# Patient Record
Sex: Female | Born: 2000 | Race: Black or African American | Hispanic: No | Marital: Single | Smoking: Never smoker
Health system: Southern US, Community
[De-identification: ages and names within clinical notes are randomized; demographics above are authoritative.]

## PROBLEM LIST (undated history)

## (undated) DIAGNOSIS — D573 Sickle-cell trait: Secondary | ICD-10-CM

## (undated) DIAGNOSIS — N390 Urinary tract infection, site not specified: Secondary | ICD-10-CM

## (undated) DIAGNOSIS — D563 Thalassemia minor: Secondary | ICD-10-CM

## (undated) DIAGNOSIS — A599 Trichomoniasis, unspecified: Secondary | ICD-10-CM

## (undated) DIAGNOSIS — K589 Irritable bowel syndrome without diarrhea: Secondary | ICD-10-CM

## (undated) DIAGNOSIS — U071 COVID-19: Secondary | ICD-10-CM

## (undated) DIAGNOSIS — N730 Acute parametritis and pelvic cellulitis: Secondary | ICD-10-CM

## (undated) DIAGNOSIS — F32A Depression, unspecified: Secondary | ICD-10-CM

## (undated) HISTORY — PX: NO PAST SURGERIES: SHX2092

## (undated) HISTORY — PX: INDUCED ABORTION: SHX677

---
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
## 2020-02-14 ENCOUNTER — Emergency Department (HOSPITAL_COMMUNITY): Payer: Medicaid Other

## 2020-02-14 ENCOUNTER — Other Ambulatory Visit: Payer: Self-pay

## 2020-02-14 ENCOUNTER — Emergency Department (HOSPITAL_COMMUNITY)
Admission: EM | Admit: 2020-02-14 | Discharge: 2020-02-14 | Disposition: A | Discharge status: Home / Self Care | Payer: Medicaid Other | Attending: Emergency Medicine | Admitting: Emergency Medicine

## 2020-02-14 DIAGNOSIS — R1011 Right upper quadrant pain: Secondary | ICD-10-CM | POA: Diagnosis not present

## 2020-02-14 DIAGNOSIS — R110 Nausea: Secondary | ICD-10-CM | POA: Diagnosis not present

## 2020-02-14 DIAGNOSIS — Z20822 Contact with and (suspected) exposure to covid-19: Secondary | ICD-10-CM | POA: Diagnosis not present

## 2020-02-14 DIAGNOSIS — R109 Unspecified abdominal pain: Secondary | ICD-10-CM | POA: Diagnosis present

## 2020-02-14 LAB — SARS CORONAVIRUS 2 (TAT 6-24 HRS): SARS Coronavirus 2: NEGATIVE

## 2020-02-14 LAB — URINALYSIS, ROUTINE W REFLEX MICROSCOPIC
Bacteria, UA: NONE SEEN
Bilirubin Urine: NEGATIVE
Glucose, UA: NEGATIVE mg/dL
Hgb urine dipstick: NEGATIVE
Ketones, ur: NEGATIVE mg/dL
Nitrite: NEGATIVE
Protein, ur: NEGATIVE mg/dL
Specific Gravity, Urine: 1.024 (ref 1.005–1.030)
pH: 5 (ref 5.0–8.0)

## 2020-02-14 LAB — CBC
HCT: 36.2 % (ref 36.0–46.0)
Hemoglobin: 11.6 g/dL — ABNORMAL LOW (ref 12.0–15.0)
MCH: 25.6 pg — ABNORMAL LOW (ref 26.0–34.0)
MCHC: 32 g/dL (ref 30.0–36.0)
MCV: 79.7 fL — ABNORMAL LOW (ref 80.0–100.0)
Platelets: 275 10*3/uL (ref 150–400)
RBC: 4.54 MIL/uL (ref 3.87–5.11)
RDW: 14.6 % (ref 11.5–15.5)
WBC: 9.4 10*3/uL (ref 4.0–10.5)
nRBC: 0 % (ref 0.0–0.2)

## 2020-02-14 LAB — COMPREHENSIVE METABOLIC PANEL
ALT: 9 U/L (ref 0–44)
AST: 17 U/L (ref 15–41)
Albumin: 4.1 g/dL (ref 3.5–5.0)
Alkaline Phosphatase: 69 U/L (ref 38–126)
Anion gap: 10 (ref 5–15)
BUN: 7 mg/dL (ref 6–20)
CO2: 25 mmol/L (ref 22–32)
Calcium: 9.2 mg/dL (ref 8.9–10.3)
Chloride: 103 mmol/L (ref 98–111)
Creatinine, Ser: 0.92 mg/dL (ref 0.44–1.00)
GFR calc Af Amer: >60 mL/min (ref >60)
GFR calc non Af Amer: >60 mL/min (ref >60)
Glucose, Bld: 89 mg/dL (ref 70–99)
Potassium: 3.2 mmol/L — ABNORMAL LOW (ref 3.5–5.1)
Sodium: 138 mmol/L (ref 135–145)
Total Bilirubin: 0.1 mg/dL — ABNORMAL LOW (ref 0.3–1.2)
Total Protein: 7 g/dL (ref 6.5–8.1)

## 2020-02-14 LAB — I-STAT BETA HCG BLOOD, ED (MC, WL, AP ONLY): I-stat hCG, quantitative: <5 m[IU]/mL (ref <5)

## 2020-02-14 LAB — LIPASE, BLOOD: Lipase: 29 U/L (ref 11–51)

## 2020-02-14 LAB — PREGNANCY, URINE: Preg Test, Ur: NEGATIVE

## 2020-02-14 MED ORDER — ONDANSETRON HCL 4 MG/2ML IJ SOLN
4.0000 mg | Freq: Once | INTRAMUSCULAR | Status: AC
  Administered 2020-02-14: 4 mg via INTRAVENOUS
  Filled 2020-02-14: qty 2

## 2020-02-14 MED ORDER — MORPHINE SULFATE (PF) 4 MG/ML IV SOLN
4.0000 mg | Freq: Once | INTRAVENOUS | Status: AC
  Administered 2020-02-14: 4 mg via INTRAVENOUS
  Filled 2020-02-14: qty 1

## 2020-02-14 MED ORDER — SODIUM CHLORIDE 0.9 % IV BOLUS
1000.0000 mL | Freq: Once | INTRAVENOUS | Status: AC
  Administered 2020-02-14: 1000 mL via INTRAVENOUS

## 2020-02-14 MED ORDER — ONDANSETRON 4 MG PO TBDP
4.0000 mg | ORAL_TABLET | Freq: Three times a day (TID) | ORAL | 0 refills | Status: DC | PRN
Start: 2020-02-14 — End: 2020-04-12

## 2020-02-14 MED ORDER — FAMOTIDINE IN NACL 20-0.9 MG/50ML-% IV SOLN
20.0000 mg | Freq: Once | INTRAVENOUS | Status: AC
  Administered 2020-02-14: 20 mg via INTRAVENOUS
  Filled 2020-02-14: qty 50

## 2020-02-14 NOTE — Discharge Instructions (Addendum)
Take Zofran as needed for nausea Take Tylenol for abdominal pain You can try warm compresses on the abdomen as well to help with the pain Please follow up with your primary doctor Return to the ER if you are worsening

## 2020-02-14 NOTE — ED Notes (Signed)
Patient transported to CT 

## 2020-02-14 NOTE — ED Notes (Signed)
This Clinical research associate reported to PA that Pt's family wants a CT done of ABD.

## 2020-02-14 NOTE — ED Notes (Signed)
Patient transported to Ultrasound 

## 2020-02-14 NOTE — ED Notes (Signed)
Patient verbalizes understanding of discharge instructions . Opportunity for questions and answers were provided . Armband removed by staff ,Pt discharged from ED. W/C  offered at D/C  and Declined W/C at D/C and was escorted to lobby by RN.  

## 2020-02-14 NOTE — ED Provider Notes (Signed)
[facility] EMERGENCY DEPARTMENT Provider Note   CSN: [id number] Arrival date & time: 02/14/20  0058     History Chief Complaint  Patient presents with  . Abdominal Pain    [facility] is a 19 y.o. female who presents with abdominal pain. She states she's had intermittent RUQ abdominal pain for one week. It feels like "pain". It comes and goes. She cannot identify any aggravating or allieviating factors. She reports associated nausea without vomiting. She hasn't been eating much because she doesn't have an appetite. She has not tried anything for pain. She denies fever, chills, chest pain, SOB, cough, vomiting, diarrhea/constipation, urinary symptoms, vaginal discharge or bleeding. She is 4 days late for her period this month. She denies prior abdominal surgeries.  HPI     Past Medical History:  Diagnosis Date  . Irritable bowel syndrome (IBS)   . Sickle cell trait ([facility])     There are no problems to display for this patient.   Past Surgical History:  Procedure Laterality Date  . NO PAST SURGERIES       OB History    Gravida  1   Para      Term      Preterm      AB      Living        SAB      TAB      Ectopic      Multiple      Live Births              No family history on file.  Social History   Tobacco Use  . Smoking status: Never Smoker  . Smokeless tobacco: Never Used  Vaping Use  . Vaping Use: Never used  Substance Use Topics  . Alcohol use: No  . Drug use: No    Home Medications Prior to Admission medications   Medication Sig Start Date End Date Taking? Authorizing Provider  dicyclomine (BENTYL) 20 MG tablet Take 1 tablet (20 mg total) by mouth 2 (two) times daily. Patient not taking: Reported on 02/14/2020 11/14/19   Renne Crigler, PA-C  metoCLOPramide (REGLAN) 10 MG tablet Take 1 tablet (10 mg total) by mouth 3 (three) times daily between meals as needed for nausea. Patient not taking: Reported on 02/14/2020  10/04/19   Sharen Counter A, CNM  ondansetron (ZOFRAN ODT) 4 MG disintegrating tablet Take 1 tablet (4 mg total) by mouth every 8 (eight) hours as needed for nausea or vomiting. Patient not taking: Reported on 02/14/2020 11/14/19   Renne Crigler, PA-C  promethazine (PHENERGAN) 12.5 MG tablet Take 1-2 tablets (12.5-25 mg total) by mouth at bedtime. Patient not taking: Reported on 02/14/2020 10/04/19   Hurshel Party, CNM    Allergies    Milk [lac bovis]  Review of Systems   Review of Systems  Constitutional: Negative for chills and fever.  Gastrointestinal: Positive for abdominal pain and nausea. Negative for constipation, diarrhea and vomiting.  Genitourinary: Negative for dysuria, flank pain, hematuria, menstrual problem, pelvic pain, vaginal bleeding and vaginal discharge.  All other systems reviewed and are negative.   Physical Exam Updated Vital Signs BP 118/87 (BP Location: Right Arm)   Pulse 65   Temp 98.7 F (37.1 C) (Oral)   Resp 16   Ht 5\' 7"  (1.702 m)   Wt 65.8 kg   LMP 01/16/2019   SpO2 100%   BMI 22.71 kg/m   Physical Exam Vitals and nursing note  reviewed.  Constitutional:      General: She is not in acute distress.    Appearance: She is well-developed.  HENT:     Head: Normocephalic and atraumatic.  Eyes:     General: No scleral icterus.       Right eye: No discharge.        Left eye: No discharge.     Conjunctiva/sclera: Conjunctivae normal.     Pupils: Pupils are equal, round, and reactive to light.  Cardiovascular:     Rate and Rhythm: Normal rate.  Pulmonary:     Effort: Pulmonary effort is normal. No respiratory distress.  Abdominal:     General: Abdomen is flat. Bowel sounds are normal. There is no distension.     Palpations: Abdomen is soft.     Tenderness: There is abdominal tenderness in the right upper quadrant.     Hernia: No hernia is present.  Musculoskeletal:     Cervical back: Normal range of motion.  Skin:    General: Skin  is warm and dry.  Neurological:     Mental Status: She is alert and oriented to person, place, and time.  Psychiatric:        Behavior: Behavior normal.     ED Results / Procedures / Treatments   Labs (all labs ordered are listed, but only abnormal results are displayed) Labs Reviewed  COMPREHENSIVE METABOLIC PANEL - Abnormal; Notable for the following components:      Result Value   Potassium 3.2 (*)    Total Bilirubin 0.1 (*)    All other components within normal limits  CBC - Abnormal; Notable for the following components:   Hemoglobin 11.6 (*)    MCV 79.7 (*)    MCH 25.6 (*)    All other components within normal limits  URINALYSIS, ROUTINE W REFLEX MICROSCOPIC - Abnormal; Notable for the following components:   APPearance HAZY (*)    Leukocytes,Ua SMALL (*)    All other components within normal limits  SARS CORONAVIRUS 2 (TAT 6-24 HRS)  LIPASE, BLOOD  PREGNANCY, URINE  I-STAT BETA HCG BLOOD, ED ([facility], [facility], [facility] ONLY)  POC URINE PREG, ED    EKG None  Radiology CT Renal Stone Study  Result Date: 02/14/2020 CLINICAL DATA:  Flank pain EXAM: CT ABDOMEN AND PELVIS WITHOUT CONTRAST TECHNIQUE: Multidetector CT imaging of the abdomen and pelvis was performed following the standard protocol without IV contrast. COMPARISON:  02/14/2020 right upper quadrant ultrasound. FINDINGS: Please note that lack of intravenous contrast limits evaluation of the viscera and vasculature. Lower chest: Lung bases are clear. Pectus excavatum. Mild hypoattenuation of the cardiac blood pool may reflect anemia. Hepatobiliary: No focal hepatic lesion. No biliary dilatation. Gallbladder is unremarkable. Pancreas: No focal lesions or pancreatic ductal dilatation. No surrounding inflammation. Spleen: Unremarkable. Adrenals/Urinary Tract: Adrenal glands are unremarkable. No focal renal lesion or calculi. No hydronephrosis. Bladder is unremarkable and without intraluminal calculi. Stomach/Bowel: Stomach is within  normal limits. Appendix appears normal. No evidence of obstruction. No bowel wall thickening or inflammatory changes. No ascites. Vascular/Lymphatic: Vasculature is within normal limits for patient's age. No abdominopelvic adenopathy. Reproductive: Unremarkable. Other: External soft tissues are unremarkable. Musculoskeletal: No acute or significant osseous findings. IMPRESSION: No acute abdominopelvic process. No evidence of nephrolithiasis or hydronephrosis. Pectus excavatum.  Query anemia. Electronically Signed   By: Stana Bunting M.D.   On: 02/14/2020 15:38   US Abdomen Limited RUQ  Result Date: 02/14/2020 CLINICAL DATA:  Right upper quadrant pain EXAM:  ULTRASOUND ABDOMEN LIMITED RIGHT UPPER QUADRANT COMPARISON:  None. FINDINGS: Gallbladder: No gallstones or wall thickening visualized. No sonographic Murphy sign noted by sonographer. Common bile duct: Diameter: 1.6 mm Liver: No focal lesion identified. Within normal limits in parenchymal echogenicity. Portal vein is patent on color Doppler imaging with normal direction of blood flow towards the liver. Other: None. IMPRESSION: No acute abnormality noted. Electronically Signed   By: Alcide Clever M.D.   On: 02/14/2020 11:21    Procedures Procedures (including critical care time)  Medications Ordered in ED Medications  morphine 4 MG/ML injection 4 mg (4 mg Intravenous Given 02/14/20 1021)  ondansetron (ZOFRAN) injection 4 mg (4 mg Intravenous Given 02/14/20 1022)  sodium chloride 0.9 % bolus 1,000 mL (0 mLs Intravenous Stopped 02/14/20 1246)  morphine 4 MG/ML injection 4 mg (4 mg Intravenous Given 02/14/20 1251)  famotidine (PEPCID) IVPB 20 mg premix (0 mg Intravenous Stopped 02/14/20 1544)    ED Course  I have reviewed the triage vital signs and the nursing notes.  Pertinent labs & imaging results that were available during my care of the patient were reviewed by me and considered in my medical decision making (see chart for details).  19  year old female presents with RUQ pain which has been on and off for about one week. She has been waiting almost 9 hours prior to my evaluation. Her vitals are normal. She is tender in the RUQ. Labs are overall reassuring. She has mild hypokalemia (3.2) and anemia (11.6). UA has small leukocytes with Ca oxalate crystals. She is not pregnant. Will obtain RUQ [place] and provide symptom control.  [place] is negative. Re-evaluated pt. She is not feeling much better. We discussed risk vs benefit of CT imaging. At this time patient would like to forgo any further imaging and be discharged.   Informed by nursing that the patient has changed her mind - she wants to have imaging done. CT renal ordered.  CT is negative. I'm really not sure what is causing her pain. 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 Advised Tylenol, heating pad, and will rx Zofran. She was advised that she should return if she is not improving. She is asking for a COVID test and work note which was done.   MDM Rules/Calculators/A&P                           Final Clinical Impression(s) / ED Diagnoses Final diagnoses:  RUQ pain    Rx / DC Orders ED Discharge Orders    None       Bethel Born, PA-C 02/15/20 1039    Geoffery Lyons, MD 02/15/20 [phone]

## 2020-02-14 NOTE — ED Notes (Signed)
Pt given gingerale and crackers 

## 2020-02-14 NOTE — ED Triage Notes (Signed)
Pt has been having abdominal pain x 2 days. Pt said having some nausea, no vomiting but need to come up. Pt said possible pregnancy. Pt has no fevers, no chills, no urination issues.

## 2020-03-10 ENCOUNTER — Emergency Department (HOSPITAL_COMMUNITY)
Admission: EM | Admit: 2020-03-10 | Discharge: 2020-03-10 | Disposition: A | Discharge status: Home / Self Care | Payer: Medicaid Other | Attending: Emergency Medicine | Admitting: Emergency Medicine

## 2020-03-10 ENCOUNTER — Other Ambulatory Visit: Payer: Self-pay

## 2020-03-10 ENCOUNTER — Encounter (HOSPITAL_COMMUNITY): Payer: Self-pay

## 2020-03-10 DIAGNOSIS — Z79899 Other long term (current) drug therapy: Secondary | ICD-10-CM | POA: Insufficient documentation

## 2020-03-10 DIAGNOSIS — U071 COVID-19: Secondary | ICD-10-CM | POA: Diagnosis not present

## 2020-03-10 DIAGNOSIS — M791 Myalgia, unspecified site: Secondary | ICD-10-CM | POA: Diagnosis present

## 2020-03-10 LAB — SARS CORONAVIRUS 2 BY RT PCR (HOSPITAL ORDER, PERFORMED IN ~~LOC~~ HOSPITAL LAB): SARS Coronavirus 2: POSITIVE — AB

## 2020-03-10 NOTE — ED Triage Notes (Signed)
Patient complains of body aches, congestion. Has had 1st covid vaccine. Patient reports low grade fever.

## 2020-03-10 NOTE — ED Provider Notes (Signed)
[facility] EMERGENCY DEPARTMENT Provider Note   CSN: [id number] Arrival date & time: 03/10/20  1438     History No chief complaint on file.   Heidi Martin is a 19 y.o. female.  Presents to ER with concern for body aches, congestion, headache.  Reports symptoms ongoing for the past couple days, not worse headache of her life, not sudden onset, no associated neck pain or neck stiffness.  Body aches generalized, no particular place.  Has had some chest discomfort but no ongoing chest pain.  No shortness of breath.  Chills, low-grade fever at home.  Had her first Covid vaccine recently, has not had the second vaccine.  History of IBS, sickle cell trait.  HPI     Past Medical History:  Diagnosis Date  . Irritable bowel syndrome (IBS)   . Sickle cell trait (HCC)     There are no problems to display for this patient.   Past Surgical History:  Procedure Laterality Date  . NO PAST SURGERIES       OB History    Gravida  1   Para      Term      Preterm      AB      Living        SAB      TAB      Ectopic      Multiple      Live Births              No family history on file.  Social History   Tobacco Use  . Smoking status: Never Smoker  . Smokeless tobacco: Never Used  Vaping Use  . Vaping Use: Never used  Substance Use Topics  . Alcohol use: No  . Drug use: No    Home Medications Prior to Admission medications   Medication Sig Start Date End Date Taking? Authorizing Provider  ondansetron (ZOFRAN ODT) 4 MG disintegrating tablet Take 1 tablet (4 mg total) by mouth every 8 (eight) hours as needed for nausea or vomiting. 02/14/20   Bethel Born, PA-C  dicyclomine (BENTYL) 20 MG tablet Take 1 tablet (20 mg total) by mouth 2 (two) times daily. Patient not taking: Reported on 02/14/2020 11/14/19 02/14/20  Renne Crigler, PA-C  metoCLOPramide (REGLAN) 10 MG tablet Take 1 tablet (10 mg total) by mouth 3 (three) times daily between  meals as needed for nausea. Patient not taking: Reported on 02/14/2020 10/04/19 02/14/20  Leftwich-Kirby, Wilmer Floor, CNM  promethazine (PHENERGAN) 12.5 MG tablet Take 1-2 tablets (12.5-25 mg total) by mouth at bedtime. Patient not taking: Reported on 02/14/2020 10/04/19 02/14/20  Leftwich-Kirby, Wilmer Floor, CNM    Allergies    Milk [lac bovis]  Review of Systems   Review of Systems  Constitutional: Positive for chills and fever.  HENT: Negative for ear pain and sore throat.   Eyes: Negative for pain and visual disturbance.  Respiratory: Negative for cough and shortness of breath.   Cardiovascular: Positive for chest pain. Negative for palpitations.  Gastrointestinal: Negative for abdominal pain and vomiting.  Genitourinary: Negative for dysuria and hematuria.  Musculoskeletal: Positive for arthralgias and myalgias. Negative for back pain.  Skin: Negative for color change and rash.  Neurological: Negative for seizures and syncope.  All other systems reviewed and are negative.   Physical Exam Updated Vital Signs BP 120/75   Pulse 94   Temp (!) 100.5 F (38.1 C) (Oral)   Resp 15  LMP 01/16/2019   SpO2 100%   Physical Exam Vitals and nursing note reviewed.  Constitutional:      General: She is not in acute distress.    Appearance: She is well-developed.  HENT:     Head: Normocephalic and atraumatic.  Eyes:     Conjunctiva/sclera: Conjunctivae normal.  Cardiovascular:     Rate and Rhythm: Normal rate and regular rhythm.     Heart sounds: No murmur heard.   Pulmonary:     Effort: Pulmonary effort is normal. No respiratory distress.     Breath sounds: Normal breath sounds.  Abdominal:     Palpations: Abdomen is soft.     Tenderness: There is no abdominal tenderness.  Musculoskeletal:        General: No deformity or signs of injury.     Cervical back: Neck supple.  Skin:    General: Skin is warm and dry.  Neurological:     General: No focal deficit present.     Mental Status:  She is alert and oriented to person, place, and time.  Psychiatric:        Mood and Affect: Mood normal.        Behavior: Behavior normal.     ED Results / Procedures / Treatments   Labs (all labs ordered are listed, but only abnormal results are displayed) Labs Reviewed  SARS CORONAVIRUS 2 BY RT PCR (HOSPITAL ORDER, PERFORMED IN [facility]) - Abnormal; Notable for the following components:      Result Value   SARS Coronavirus 2 POSITIVE (*)    All other components within normal limits    EKG None  Radiology No results found.  Procedures Procedures (including critical care time)  Medications Ordered in ED Medications - No data to display  ED Course  I have reviewed the triage vital signs and the nursing notes.  Pertinent labs & imaging results that were available during my care of the patient were reviewed by me and considered in my medical decision making (see chart for details).    MDM Rules/Calculators/A&P                         19 year old female presenting to the ER with concern for body aches, headache, congestion.  Covid test was positive.  At present, suspect all her symptoms are related to COVID-19.  She is remarkably well-appearing with stable vital signs and I believe she is appropriate for discharge and outpatient management at this time.  Recommended virtual recheck with primary doctor, reviewed return precautions and isolation precautions with patient and her mother via phone.    After the discussed management above, the patient was determined to be safe for discharge.  The patient was in agreement with this plan and all questions regarding their care were answered.  ED return precautions were discussed and the patient will return to the ED with any significant worsening of condition.   Heidi Martin was evaluated in Emergency Department on 03/10/2020 for the symptoms described in the history of present illness. She was evaluated in the context  of the global COVID-19 pandemic, which necessitated consideration that the patient might be at risk for infection with the SARS-CoV-2 virus that causes COVID-19. Institutional protocols and algorithms that pertain to the evaluation of patients at risk for COVID-19 are in a state of rapid change based on information released by regulatory bodies including the CDC and federal and state organizations. These policies  and algorithms were followed during the patient's care in the ED.  Final Clinical Impression(s) / ED Diagnoses Final diagnoses:  COVID-19    Rx / DC Orders ED Discharge Orders    None       Milagros Loll, MD 03/10/20 2123

## 2020-03-10 NOTE — Discharge Instructions (Signed)
Return if you get short of breath or develop worsening symptoms.  Follow up with your primary doctor.

## 2020-03-11 ENCOUNTER — Encounter: Payer: Self-pay | Admitting: Nurse Practitioner

## 2020-03-11 ENCOUNTER — Telehealth (HOSPITAL_COMMUNITY): Payer: Self-pay | Admitting: Nurse Practitioner

## 2020-03-11 DIAGNOSIS — U071 COVID-19: Secondary | ICD-10-CM

## 2020-03-11 NOTE — Telephone Encounter (Signed)
Called to Discuss with patient about Covid symptoms and the use of regeneron, a monoclonal antibody infusion for those with mild to moderate Covid symptoms and at a high risk of hospitalization.     Pt is qualified for this infusion at the [facility] center due to co-morbid conditions and/or a member of an at-risk group.     Unable to reach pt. Left message to return call. Sent mychart message.   Jadore Veals, DNP, AGNP-C [phone] (Infusion Center Hotline)  

## 2020-03-12 ENCOUNTER — Other Ambulatory Visit (HOSPITAL_COMMUNITY): Payer: Self-pay | Admitting: Nurse Practitioner

## 2020-03-12 DIAGNOSIS — U071 COVID-19: Secondary | ICD-10-CM

## 2020-03-12 NOTE — Progress Notes (Signed)
I connected by phone with Heidi Martin on 03/12/2020 at 2:09 PM to discuss the potential use of an new treatment for mild to moderate COVID-19 viral infection in non-hospitalized patients.  This patient is a 19 y.o. female that meets the FDA criteria for Emergency Use Authorization of casirivimab\imdevimab.  Has a (+) direct SARS-CoV-2 viral test result  Has mild or moderate COVID-19   Is ? 19 years of age and weighs ? 40 kg  Is NOT hospitalized due to COVID-19  Is NOT requiring oxygen therapy or requiring an increase in baseline oxygen flow rate due to COVID-19  Is within 10 days of symptom onset  Has at least one of the high risk factor(s) for progression to severe COVID-19 and/or hospitalization as defined in EUA.  Specific high risk criteria : Other high risk medical condition per CDC:  social vulnerability   I have spoken and communicated the following to the patient or parent/caregiver:  1. FDA has authorized the emergency use of casirivimab\imdevimab for the treatment of mild to moderate COVID-19 in adults and pediatric patients with positive results of direct SARS-CoV-2 viral testing who are 61 years of age and older weighing at least 40 kg, and who are at high risk for progressing to severe COVID-19 and/or hospitalization.  2. The significant known and potential risks and benefits of casirivimab\imdevimab, and the extent to which such potential risks and benefits are unknown.  3. Information on available alternative treatments and the risks and benefits of those alternatives, including clinical trials.  4. Patients treated with casirivimab\imdevimab should continue to self-isolate and use infection control measures (e.g., wear mask, isolate, social distance, avoid sharing personal items, clean and disinfect "high touch" surfaces, and frequent handwashing) according to CDC guidelines.   5. The patient or parent/caregiver has the option to accept or refuse casirivimab\imdevimab  .  After reviewing this information with the patient, The patient agreed to proceed with receiving casirivimab\imdevimab infusion and will be provided a copy of the Fact sheet prior to receiving the infusion.Consuello Masse, DNP, AGNP-C [phone] (Infusion Center Hotline)

## 2020-03-13 ENCOUNTER — Ambulatory Visit (HOSPITAL_COMMUNITY)
Admission: RE | Admit: 2020-03-13 | Discharge: 2020-03-13 | Disposition: A | Discharge status: Home / Self Care | Payer: Medicaid Other | Source: Ambulatory Visit | Attending: Pulmonary Disease | Admitting: Pulmonary Disease

## 2020-03-13 DIAGNOSIS — U071 COVID-19: Secondary | ICD-10-CM | POA: Diagnosis present

## 2020-03-13 MED ORDER — SODIUM CHLORIDE 0.9 % IV SOLN: INTRAVENOUS | Status: DC | PRN

## 2020-03-13 MED ORDER — SODIUM CHLORIDE 0.9 % IV SOLN
1200.0000 mg | Freq: Once | INTRAVENOUS | Status: AC
  Administered 2020-03-13: 1200 mg via INTRAVENOUS
  Filled 2020-03-13: qty 10

## 2020-03-13 MED ORDER — FAMOTIDINE IN NACL 20-0.9 MG/50ML-% IV SOLN: 20.0000 mg | Freq: Once | INTRAVENOUS | Status: DC | PRN

## 2020-03-13 MED ORDER — ALBUTEROL SULFATE HFA 108 (90 BASE) MCG/ACT IN AERS: 2.0000 | INHALATION_SPRAY | Freq: Once | RESPIRATORY_TRACT | Status: DC | PRN

## 2020-03-13 MED ORDER — METHYLPREDNISOLONE SODIUM SUCC 125 MG IJ SOLR: 125.0000 mg | Freq: Once | INTRAMUSCULAR | Status: DC | PRN

## 2020-03-13 MED ORDER — DIPHENHYDRAMINE HCL 50 MG/ML IJ SOLN: 50.0000 mg | Freq: Once | INTRAMUSCULAR | Status: DC | PRN

## 2020-03-13 MED ORDER — EPINEPHRINE 0.3 MG/0.3ML IJ SOAJ: 0.3000 mg | Freq: Once | INTRAMUSCULAR | Status: DC | PRN

## 2020-03-13 NOTE — Progress Notes (Signed)
  Diagnosis: COVID-19  Physician: Dr Wright  Procedure: [facility] Med: remdesivir infusion - Provided patient with remdesivir fact sheet for patients, parents and caregivers prior to infusion.  Complications: No immediate complications noted.  Discharge: Discharged home   Heidi Martin 03/13/2020   

## 2020-03-13 NOTE — Discharge Instructions (Signed)

## 2020-03-28 ENCOUNTER — Encounter (HOSPITAL_COMMUNITY): Payer: Self-pay

## 2020-03-28 ENCOUNTER — Emergency Department (HOSPITAL_COMMUNITY)
Admission: EM | Admit: 2020-03-28 | Discharge: 2020-03-29 | Disposition: A | Discharge status: Home / Self Care | Payer: Medicaid Other | Attending: Emergency Medicine | Admitting: Emergency Medicine

## 2020-03-28 ENCOUNTER — Emergency Department (HOSPITAL_COMMUNITY): Payer: Medicaid Other

## 2020-03-28 DIAGNOSIS — H02843 Edema of right eye, unspecified eyelid: Secondary | ICD-10-CM | POA: Diagnosis present

## 2020-03-28 DIAGNOSIS — R6884 Jaw pain: Secondary | ICD-10-CM | POA: Diagnosis not present

## 2020-03-28 DIAGNOSIS — S0231XA Fracture of orbital floor, right side, initial encounter for closed fracture: Secondary | ICD-10-CM | POA: Diagnosis not present

## 2020-03-28 DIAGNOSIS — M79641 Pain in right hand: Secondary | ICD-10-CM | POA: Insufficient documentation

## 2020-03-28 DIAGNOSIS — M791 Myalgia, unspecified site: Secondary | ICD-10-CM | POA: Insufficient documentation

## 2020-03-28 DIAGNOSIS — S0083XA Contusion of other part of head, initial encounter: Secondary | ICD-10-CM | POA: Diagnosis not present

## 2020-03-28 HISTORY — DX: COVID-19: U07.1

## 2020-03-28 NOTE — ED Notes (Signed)
No answer x 4  
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
## 2020-03-29 ENCOUNTER — Emergency Department (HOSPITAL_COMMUNITY)
Admission: EM | Admit: 2020-03-29 | Discharge: 2020-03-30 | Disposition: A | Discharge status: Home / Self Care | Payer: Medicaid Other | Source: Home / Self Care | Attending: Emergency Medicine | Admitting: Emergency Medicine

## 2020-03-29 ENCOUNTER — Emergency Department (HOSPITAL_COMMUNITY): Payer: Medicaid Other

## 2020-03-29 DIAGNOSIS — S0083XA Contusion of other part of head, initial encounter: Secondary | ICD-10-CM

## 2020-03-29 DIAGNOSIS — M791 Myalgia, unspecified site: Secondary | ICD-10-CM | POA: Insufficient documentation

## 2020-03-29 DIAGNOSIS — M7918 Myalgia, other site: Secondary | ICD-10-CM

## 2020-03-29 DIAGNOSIS — S0231XA Fracture of orbital floor, right side, initial encounter for closed fracture: Secondary | ICD-10-CM

## 2020-03-29 MED ORDER — OXYCODONE-ACETAMINOPHEN 5-325 MG PO TABS
1.0000 | ORAL_TABLET | Freq: Once | ORAL | Status: AC
  Administered 2020-03-29: 1 via ORAL
  Filled 2020-03-29: qty 1
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
## 2020-03-30 ENCOUNTER — Emergency Department (HOSPITAL_COMMUNITY): Payer: Medicaid Other

## 2020-03-30 LAB — POC URINE PREG, ED: Preg Test, Ur: NEGATIVE

## 2020-03-30 MED ORDER — OXYCODONE-ACETAMINOPHEN 5-325 MG PO TABS: 1.0000 | ORAL_TABLET | Freq: Four times a day (QID) | ORAL | 0 refills | Status: DC | PRN

## 2020-03-30 MED ORDER — OXYCODONE-ACETAMINOPHEN 5-325 MG PO TABS: 1.0000 | ORAL_TABLET | Freq: Once | ORAL | Status: DC

## 2020-03-30 MED ORDER — NAPROXEN 500 MG PO TABS: 500.0000 mg | ORAL_TABLET | Freq: Two times a day (BID) | ORAL | 0 refills | Status: DC

## 2020-03-30 MED ORDER — DOCUSATE SODIUM 100 MG PO CAPS: 100.0000 mg | ORAL_CAPSULE | Freq: Two times a day (BID) | ORAL | 0 refills | Status: DC

## 2020-03-30 NOTE — ED Notes (Signed)
Patient verbalizes understanding of discharge instructions. Opportunity for questioning and answers were provided. Armband removed by staff, pt discharged from ED.  
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
## 2020-03-30 NOTE — Discharge Instructions (Signed)
Please read and follow all provided instructions.  Your diagnoses today include:  1. Closed fracture of right orbital floor, initial encounter (HCC)   2. Contusion of face, initial encounter   3. Musculoskeletal pain     Tests performed today include:  CT scan of your head and face - showed facial bone fractures as discussed  X-rays of your legs -were normal without signs of broken bones  Vital signs. See below for your results today.   Medications prescribed:   Percocet (oxycodone/acetaminophen) - narcotic pain medication  DO NOT drive or perform any activities that require you to be awake and alert because this medicine can make you drowsy. BE VERY CAREFUL not to take multiple medicines containing Tylenol (also called acetaminophen). Doing so can lead to an overdose which can damage your liver and cause liver failure and possibly death.   Colace - stool softener  This medication can be found over-the-counter.  Use this medication while taking the stronger pain medicine to prevent constipation.   Naproxen - anti-inflammatory pain medication  Do not exceed 500mg  naproxen every 12 hours, take with food  You have been prescribed an anti-inflammatory medication or NSAID. Take with food. Take smallest effective dose for the shortest duration needed for your pain. Stop taking if you experience stomach pain or vomiting.    Take any prescribed medications only as directed.  Home care instructions:  Follow any educational materials contained in this packet.  Keep your head elevated above the level of your heart when possible to minimize swelling.  Use ice 15 minutes every hour on the area of swelling.  Do not apply ice directly to the face.  Follow-up instructions: It is very important that you call the doctor listed to schedule a follow-up appointment to make sure that your injuries are healing appropriately.  Return instructions:  SEEK IMMEDIATE MEDICAL ATTENTION IF:  There  is confusion or drowsiness (although children frequently become drowsy after injury).   You cannot awaken the injured person.   You have more than one episode of vomiting.   You notice dizziness or unsteadiness which is getting worse, or inability to walk.   You have convulsions or unconsciousness.   You experience severe, persistent headaches not relieved by Tylenol.  You cannot use arms or legs normally.   There are changes in pupil sizes. (This is the black center in the colored part of the eye)   There is clear or bloody discharge from the nose or ears.   You have change in speech, vision, swallowing, or understanding.   Localized weakness, numbness, tingling, or change in bowel or bladder control.  You have any other emergent concerns.  Additional Information: You have had a head injury which does not appear to require admission at this time.  Your vital signs today were: BP 111/79 (BP Location: Right Arm)   Pulse 71   Temp 98.3 F (36.8 C) (Oral)   Resp 17   LMP 03/17/2020 Comment: neg preg test 03/30/20  SpO2 100%  If your blood pressure (BP) was elevated above 135/85 this visit, please have this repeated by your doctor within one month. --------------

## 2020-04-09 ENCOUNTER — Encounter (HOSPITAL_COMMUNITY): Payer: Self-pay

## 2020-04-09 ENCOUNTER — Inpatient Hospital Stay (HOSPITAL_COMMUNITY): Payer: Medicaid Other

## 2020-04-09 ENCOUNTER — Other Ambulatory Visit: Payer: Self-pay

## 2020-04-09 ENCOUNTER — Inpatient Hospital Stay (HOSPITAL_COMMUNITY)
Admission: AD | Admit: 2020-04-09 | Discharge: 2020-04-10 | Disposition: A | Discharge status: Home / Self Care | Payer: Medicaid Other | Attending: Obstetrics and Gynecology | Admitting: Obstetrics and Gynecology

## 2020-04-09 DIAGNOSIS — O99611 Diseases of the digestive system complicating pregnancy, first trimester: Secondary | ICD-10-CM | POA: Diagnosis not present

## 2020-04-09 DIAGNOSIS — K589 Irritable bowel syndrome without diarrhea: Secondary | ICD-10-CM | POA: Insufficient documentation

## 2020-04-09 DIAGNOSIS — O209 Hemorrhage in early pregnancy, unspecified: Secondary | ICD-10-CM | POA: Diagnosis not present

## 2020-04-09 DIAGNOSIS — N939 Abnormal uterine and vaginal bleeding, unspecified: Secondary | ICD-10-CM | POA: Diagnosis not present

## 2020-04-09 DIAGNOSIS — O26851 Spotting complicating pregnancy, first trimester: Secondary | ICD-10-CM

## 2020-04-09 DIAGNOSIS — D573 Sickle-cell trait: Secondary | ICD-10-CM | POA: Diagnosis not present

## 2020-04-09 DIAGNOSIS — Z3201 Encounter for pregnancy test, result positive: Secondary | ICD-10-CM

## 2020-04-09 DIAGNOSIS — O3680X0 Pregnancy with inconclusive fetal viability, not applicable or unspecified: Secondary | ICD-10-CM

## 2020-04-09 DIAGNOSIS — Z6790 Unspecified blood type, Rh positive: Secondary | ICD-10-CM | POA: Diagnosis not present

## 2020-04-09 DIAGNOSIS — Z3A01 Less than 8 weeks gestation of pregnancy: Secondary | ICD-10-CM | POA: Diagnosis not present

## 2020-04-09 LAB — WET PREP, GENITAL
Sperm: NONE SEEN
Trich, Wet Prep: NONE SEEN
Yeast Wet Prep HPF POC: NONE SEEN

## 2020-04-09 LAB — CBC
HCT: 34.7 % — ABNORMAL LOW (ref 36.0–46.0)
Hemoglobin: 11.1 g/dL — ABNORMAL LOW (ref 12.0–15.0)
MCH: 25.5 pg — ABNORMAL LOW (ref 26.0–34.0)
MCHC: 32 g/dL (ref 30.0–36.0)
MCV: 79.8 fL — ABNORMAL LOW (ref 80.0–100.0)
Platelets: 283 10*3/uL (ref 150–400)
RBC: 4.35 MIL/uL (ref 3.87–5.11)
RDW: 15.3 % (ref 11.5–15.5)
WBC: 9.4 10*3/uL (ref 4.0–10.5)
nRBC: 0 % (ref 0.0–0.2)

## 2020-04-09 LAB — COMPREHENSIVE METABOLIC PANEL
ALT: 10 U/L (ref 0–44)
AST: 15 U/L (ref 15–41)
Albumin: 4 g/dL (ref 3.5–5.0)
Alkaline Phosphatase: 66 U/L (ref 38–126)
Anion gap: 10 (ref 5–15)
BUN: 9 mg/dL (ref 6–20)
CO2: 24 mmol/L (ref 22–32)
Calcium: 9 mg/dL (ref 8.9–10.3)
Chloride: 102 mmol/L (ref 98–111)
Creatinine, Ser: 0.87 mg/dL (ref 0.44–1.00)
GFR, Estimated: >60 mL/min (ref >60)
Glucose, Bld: 88 mg/dL (ref 70–99)
Potassium: 3.8 mmol/L (ref 3.5–5.1)
Sodium: 136 mmol/L (ref 135–145)
Total Bilirubin: 0.3 mg/dL (ref 0.3–1.2)
Total Protein: 6.8 g/dL (ref 6.5–8.1)

## 2020-04-09 LAB — HCG, QUANTITATIVE, PREGNANCY: hCG, Beta Chain, Quant, S: 387 m[IU]/mL — ABNORMAL HIGH (ref <5)

## 2020-04-09 LAB — POC URINE PREG, ED: Preg Test, Ur: POSITIVE — AB

## 2020-04-09 NOTE — ED Triage Notes (Signed)
Pt reports that she has been having abd cramping for the past few days, took at home pregnancy test and it was +, denies n/v/d/fevers.
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
## 2020-04-09 NOTE — ED Triage Notes (Signed)
Emergency Medicine Provider OB Triage Evaluation Note  Heidi Martin is a 19 y.o. female, who presents to the emergency department with complaints of abdominal cramping.  Patient states that she took a pregnancy test yesterday which was positive.  Patient states that she has been pregnant before, recently had abortion in June.  Does not have OB/GYN.  Last period September 2.  Denies any vaginal bleeding, vaginal pain.  States that she has having some spotting/discharge which is new for her.  Admits to unprotected intercourse.  No fevers, chills, nausea, vomiting, diarrhea. Review of  Systems  Positive: Abdominal pain Negative: Vaginal bleeding, vaginal pain, fevers, chills, nausea, vomiting.  Physical Exam  BP 100/71 (BP Location: Left Arm)    Pulse 82    Temp 98.4 F (36.9 C) (Oral)    Resp 20    LMP 03/17/2020 Comment: neg preg test 03/30/20   SpO2 100%  General: Awake, no distress  HEENT: Atraumatic  Resp: Normal effort  Cardiac: Normal rate Abd: Nondistended, generalized abdominal tenderness, no guarding. MSK: Moves all extremities without difficulty Neuro: Speech clear  Medical Decision Making  Pt evaluated for pregnancy concern and is stable for transfer to MAU. Pt is in agreement with plan for transfer.  8:51 PM Discussed with MAU APP, Joni Reining who accepts patient in transfer.  Clinical Impression  No diagnosis found.  Patient is a 19 year old female coming in with abdominal pain, pregnancy test positive. Transferred to MAU.   Farrel Gordon, PA-C 04/09/20 2206
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
## 2020-04-10 DIAGNOSIS — Z3201 Encounter for pregnancy test, result positive: Secondary | ICD-10-CM

## 2020-04-10 DIAGNOSIS — O3680X0 Pregnancy with inconclusive fetal viability, not applicable or unspecified: Secondary | ICD-10-CM

## 2020-04-10 DIAGNOSIS — Z3A01 Less than 8 weeks gestation of pregnancy: Secondary | ICD-10-CM

## 2020-04-10 DIAGNOSIS — Z6790 Unspecified blood type, Rh positive: Secondary | ICD-10-CM

## 2020-04-10 DIAGNOSIS — N939 Abnormal uterine and vaginal bleeding, unspecified: Secondary | ICD-10-CM

## 2020-04-10 NOTE — Discharge Instructions (Signed)
Ectopic Pregnancy  An ectopic pregnancy is when the fertilized egg attaches (implants) outside the uterus. Most ectopic pregnancies occur in one of the tubes where eggs travel from the ovary to the uterus (fallopian tubes), but the implanting can occur in other locations. In rare cases, ectopic pregnancies occur on the ovary, intestine, pelvis, abdomen, or cervix. In an ectopic pregnancy, the fertilized egg does not have the ability to develop into a normal, healthy baby. A ruptured ectopic pregnancy is one in which tearing or bursting of a fallopian tube causes internal bleeding. Often, there is intense lower abdominal pain, and vaginal bleeding sometimes occurs. Having an ectopic pregnancy can be life-threatening. If this dangerous condition is not treated, it can lead to blood loss, shock, or even death. What are the causes? The most common cause of this condition is damage to one of the fallopian tubes. A fallopian tube may be narrowed or blocked, and that keeps the fertilized egg from reaching the uterus. What increases the risk? This condition is more likely to develop in women of childbearing age who have different levels of risk. The levels of risk can be divided into three categories. High risk  You have gone through infertility treatment.  You have had an ectopic pregnancy before.  You have had surgery on the fallopian tubes, or another surgical procedure, such as an abortion.  You have had surgery to have the fallopian tubes tied (tubal ligation).  You have problems or diseases of the fallopian tubes.  You have been exposed to diethylstilbestrol (DES). This medicine was used until 1971, and it had effects on babies whose mothers took the medicine.  You become pregnant while using an IUD (intrauterine device) for birth control. Moderate risk  You have a history of infertility.  You have had an STI (sexually transmitted infection).  You have a history of pelvic inflammatory  disease (PID).  You have scarring from endometriosis.  You have multiple sexual partners.  You smoke. Low risk  You have had pelvic surgery.  You use vaginal douches.  You became sexually active before age 18. What are the signs or symptoms? Common symptoms of this condition include normal pregnancy symptoms, such as missing a period, nausea, tiredness, abdominal pain, breast tenderness, and bleeding. However, ectopic pregnancy will have additional symptoms, such as:  Pain with intercourse.  Irregular vaginal bleeding or spotting.  Cramping or pain on one side or in the lower abdomen.  Fast heartbeat, low blood pressure, and sweating.  Passing out while having a bowel movement. Symptoms of a ruptured ectopic pregnancy and internal bleeding may include:  Sudden, severe pain in the abdomen and pelvis.  Dizziness, weakness, light-headedness, or fainting.  Pain in the shoulder or neck area. How is this diagnosed? This condition is diagnosed by:  A pelvic exam to locate pain or a mass in the abdomen.  A pregnancy test. This blood test checks for the presence as well as the specific level of pregnancy hormone in the bloodstream.  Ultrasound. This is performed if a pregnancy test is positive. In this test, a probe is inserted into the vagina. The probe will detect a fetus, possibly in a location other than the uterus.  Taking a sample of uterus tissue (dilation and curettage, or D&C).  Surgery to perform a visual exam of the inside of the abdomen using a thin, lighted tube that has a tiny camera on the end (laparoscope).  Culdocentesis. This procedure involves inserting a needle at the top of   the vagina, behind the uterus. If blood is present in this area, it may indicate that a fallopian tube is torn. How is this treated? This condition is treated with medicine or surgery. Medicine  An injection of a medicine (methotrexate) may be given to cause the pregnancy tissue to be  absorbed. This medicine may save your fallopian tube. It may be given if: ? The diagnosis is made early, with no signs of active bleeding. ? The fallopian tube has not ruptured. ? You are considered to be a good candidate for the medicine. Usually, pregnancy hormone blood levels are checked after methotrexate treatment. This is to be sure that the medicine is effective. It may take 4-6 weeks for the pregnancy to be absorbed. Most pregnancies will be absorbed by 3 weeks. Surgery  A laparoscope may be used to remove the pregnancy tissue.  If severe internal bleeding occurs, a larger cut (incision) may be made in the lower abdomen (laparotomy) to remove the fetus and placenta. This is done to stop the bleeding.  Part or all of the fallopian tube may be removed (salpingectomy) along with the fetus and placenta. The fallopian tube may also be repaired during the surgery.  In very rare circumstances, removal of the uterus (hysterectomy) may be required.  After surgery, pregnancy hormone testing may be done to be sure that there is no pregnancy tissue left. Whether your treatment is medicine or surgery, you may receive a Rho (D) immune globulin shot to prevent problems with any future pregnancy. This shot may be given if:  You are Rh-negative and the baby's father is Rh-positive.  You are Rh-negative and you do not know the Rh type of the baby's father. Follow these instructions at home:  Rest and limit your activity after the procedure for as long as told by your health care provider.  Until your health care provider says that it is safe: ? Do not lift anything that is heavier than 10 lb (4.5 kg), or the limit that your health care provider tells you. ? Avoid physical exercise and any movement that requires effort (is strenuous).  To help prevent constipation: ? Eat a healthy diet that includes fruits, vegetables, and whole grains. ? Drink 6-8 glasses of water per day. Get help right away  if:  You develop worsening pain that is not relieved by medicine.  You have: ? A fever or chills. ? Vaginal bleeding. ? Redness and swelling at the incision site. ? Nausea and vomiting.  You feel dizzy or weak.  You feel light-headed or you faint. This information is not intended to replace advice given to you by your health care provider. Make sure you discuss any questions you have with your health care provider. Document Revised: 06/01/2017 Document Reviewed: 01/19/2016 Elsevier Patient Education  2020 [employer]        First Trimester of Pregnancy The first trimester of pregnancy is from week 1 until the end of week 13 (months 1 through 3). A week after a sperm fertilizes an egg, the egg will implant on the wall of the uterus. This embryo will begin to develop into a baby. Genes from you and your partner will form the baby. The female genes will determine whether the baby will be a boy or a girl. At 6-8 weeks, the eyes and face will be formed, and the heartbeat can be seen on ultrasound. At the end of 12 weeks, all the baby's organs will be formed. Now that you are   pregnant, you will want to do everything you can to have a healthy baby. Two of the most important things are to get good prenatal care and to follow your health care provider's instructions. Prenatal care is all the medical care you receive before the baby's birth. This care will help prevent, find, and treat any problems during the pregnancy and childbirth. Body changes during your first trimester Your body goes through many changes during pregnancy. The changes vary from woman to woman.  You may gain or lose a couple of pounds at first.  You may feel sick to your stomach (nauseous) and you may throw up (vomit). If the vomiting is uncontrollable, call your health care provider.  You may tire easily.  You may develop headaches that can be relieved by medicines. All medicines should be approved by your health care  provider.  You may urinate more often. Painful urination may mean you have a bladder infection.  You may develop heartburn as a result of your pregnancy.  You may develop constipation because certain hormones are causing the muscles that push stool through your intestines to slow down.  You may develop hemorrhoids or swollen veins (varicose veins).  Your breasts may begin to grow larger and become tender. Your nipples may stick out more, and the tissue that surrounds them (areola) may become darker.  Your gums may bleed and may be sensitive to brushing and flossing.  Dark spots or blotches (chloasma, mask of pregnancy) may develop on your face. This will likely fade after the baby is born.  Your menstrual periods will stop.  You may have a loss of appetite.  You may develop cravings for certain kinds of food.  You may have changes in your emotions from day to day, such as being excited to be pregnant or being concerned that something may go wrong with the pregnancy and baby.  You may have more vivid and strange dreams.  You may have changes in your hair. These can include thickening of your hair, rapid growth, and changes in texture. Some women also have hair loss during or after pregnancy, or hair that feels dry or thin. Your hair will most likely return to normal after your baby is born. What to expect at prenatal visits During a routine prenatal visit:  You will be weighed to make sure you and the baby are growing normally.  Your blood pressure will be taken.  Your abdomen will be measured to track your baby's growth.  The fetal heartbeat will be listened to between weeks 10 and 14 of your pregnancy.  Test results from any previous visits will be discussed. Your health care provider may ask you:  How you are feeling.  If you are feeling the baby move.  If you have had any abnormal symptoms, such as leaking fluid, bleeding, severe headaches, or abdominal cramping.  If  you are using any tobacco products, including cigarettes, chewing tobacco, and electronic cigarettes.  If you have any questions. Other tests that may be performed during your first trimester include:  Blood tests to find your blood type and to check for the presence of any previous infections. The tests will also be used to check for low iron levels (anemia) and protein on red blood cells (Rh antibodies). Depending on your risk factors, or if you previously had diabetes during pregnancy, you may have tests to check for high blood sugar that affects pregnant women (gestational diabetes).  Urine tests to check for infections, diabetes,   or protein in the urine.  An ultrasound to confirm the proper growth and development of the baby.  Fetal screens for spinal cord problems (spina bifida) and Down syndrome.  HIV (human immunodeficiency virus) testing. Routine prenatal testing includes screening for HIV, unless you choose not to have this test.  You may need other tests to make sure you and the baby are doing well. Follow these instructions at home: Medicines  Follow your health care provider's instructions regarding medicine use. Specific medicines may be either safe or unsafe to take during pregnancy.  Take a prenatal vitamin that contains at least 600 micrograms (mcg) of folic acid.  If you develop constipation, try taking a stool softener if your health care provider approves. Eating and drinking   Eat a balanced diet that includes fresh fruits and vegetables, whole grains, good sources of protein such as meat, eggs, or tofu, and low-fat dairy. Your health care provider will help you determine the amount of weight gain that is right for you.  Avoid raw meat and uncooked cheese. These carry germs that can cause birth defects in the baby.  Eating four or five small meals rather than three large meals a day may help relieve nausea and vomiting. If you start to feel nauseous, eating a few  soda crackers can be helpful. Drinking liquids between meals, instead of during meals, also seems to help ease nausea and vomiting.  Limit foods that are high in fat and processed sugars, such as fried and sweet foods.  To prevent constipation: ? Eat foods that are high in fiber, such as fresh fruits and vegetables, whole grains, and beans. ? Drink enough fluid to keep your urine clear or pale yellow. Activity  Exercise only as directed by your health care provider. Most women can continue their usual exercise routine during pregnancy. Try to exercise for 30 minutes at least 5 days a week. Exercising will help you: ? Control your weight. ? Stay in shape. ? Be prepared for labor and delivery.  Experiencing pain or cramping in the lower abdomen or lower back is a good sign that you should stop exercising. Check with your health care provider before continuing with normal exercises.  Try to avoid standing for long periods of time. Move your legs often if you must stand in one place for a long time.  Avoid heavy lifting.  Wear low-heeled shoes and practice good posture.  You may continue to have sex unless your health care provider tells you not to. Relieving pain and discomfort  Wear a good support bra to relieve breast tenderness.  Take warm sitz baths to soothe any pain or discomfort caused by hemorrhoids. Use hemorrhoid cream if your health care provider approves.  Rest with your legs elevated if you have leg cramps or low back pain.  If you develop varicose veins in your legs, wear support hose. Elevate your feet for 15 minutes, 3-4 times a day. Limit salt in your diet. Prenatal care  Schedule your prenatal visits by the twelfth week of pregnancy. They are usually scheduled monthly at first, then more often in the last 2 months before delivery.  Write down your questions. Take them to your prenatal visits.  Keep all your prenatal visits as told by your health care provider.  This is important. Safety  Wear your seat belt at all times when driving.  Make a list of emergency phone numbers, including numbers for family, friends, the hospital, and police and fire departments. General   instructions  Ask your health care provider for a referral to a local prenatal education class. Begin classes no later than the beginning of month 6 of your pregnancy.  Ask for help if you have counseling or nutritional needs during pregnancy. Your health care provider can offer advice or refer you to specialists for help with various needs.  Do not use hot tubs, steam rooms, or saunas.  Do not douche or use tampons or scented sanitary pads.  Do not cross your legs for long periods of time.  Avoid cat litter boxes and soil used by cats. These carry germs that can cause birth defects in the baby and possibly loss of the fetus by miscarriage or stillbirth.  Avoid all smoking, herbs, alcohol, and medicines not prescribed by your health care provider. Chemicals in these products affect the formation and growth of the baby.  Do not use any products that contain nicotine or tobacco, such as cigarettes and e-cigarettes. If you need help quitting, ask your health care provider. You may receive counseling support and other resources to help you quit.  Schedule a dentist appointment. At home, brush your teeth with a soft toothbrush and be gentle when you floss. Contact a health care provider if:  You have dizziness.  You have mild pelvic cramps, pelvic pressure, or nagging pain in the abdominal area.  You have persistent nausea, vomiting, or diarrhea.  You have a bad smelling vaginal discharge.  You have pain when you urinate.  You notice increased swelling in your face, hands, legs, or ankles.  You are exposed to fifth disease or chickenpox.  You are exposed to German measles (rubella) and have never had it. Get help right away if:  You have a fever.  You are leaking fluid  from your vagina.  You have spotting or bleeding from your vagina.  You have severe abdominal cramping or pain.  You have rapid weight gain or loss.  You vomit blood or material that looks like coffee grounds.  You develop a severe headache.  You have shortness of breath.  You have any kind of trauma, such as from a fall or a car accident. Summary  The first trimester of pregnancy is from week 1 until the end of week 13 (months 1 through 3).  Your body goes through many changes during pregnancy. The changes vary from woman to woman.  You will have routine prenatal visits. During those visits, your health care provider will examine you, discuss any test results you may have, and talk with you about how you are feeling. This information is not intended to replace advice given to you by your health care provider. Make sure you discuss any questions you have with your health care provider. Document Revised: 06/01/2017 Document Reviewed: 05/31/2016 Elsevier Patient Education  2020 [employer]        Miscarriage A miscarriage is the loss of an unborn baby (fetus) before the 20th week of pregnancy. Most miscarriages happen during the first 3 months of pregnancy. Sometimes, a miscarriage can happen before a woman knows that she is pregnant. Having a miscarriage can be an emotional experience. If you have had a miscarriage, talk with your health care provider about any questions you may have about miscarrying, the grieving process, and your plans for future pregnancy. What are the causes? A miscarriage may be caused by:  Problems with the genes or chromosomes of the fetus. These problems make it impossible for the baby to develop normally. They   are often the result of random errors that occur early in the development of the baby, and are not passed from parent to child (not inherited).  Infection of the cervix or uterus.  Conditions that affect hormone balance in the  body.  Problems with the cervix, such as the cervix opening and thinning before pregnancy is at term (cervical insufficiency).  Problems with the uterus. These may include: ? A uterus with an abnormal shape. ? Fibroids in the uterus. ? Congenital abnormalities. These are problems that were present at birth.  Certain medical conditions.  Smoking, drinking alcohol, or using drugs.  Injury (trauma). In many cases, the cause of a miscarriage is not known. What are the signs or symptoms? Symptoms of this condition include:  Vaginal bleeding or spotting, with or without cramps or pain.  Pain or cramping in the abdomen or lower back.  Passing fluid, tissue, or blood clots from the vagina. How is this diagnosed? This condition may be diagnosed based on:  A physical exam.  Ultrasound.  Blood tests.  Urine tests. How is this treated? Treatment for a miscarriage is sometimes not necessary if you naturally pass all the tissue that was in your uterus. If necessary, this condition may be treated with:  Dilation and curettage (D&C). This is a procedure in which the cervix is stretched open and the lining of the uterus (endometrium) is scraped. This is done only if tissue from the fetus or placenta remains in the body (incomplete miscarriage).  Medicines, such as: ? Antibiotic medicine, to treat infection. ? Medicine to help the body pass any remaining tissue. ? Medicine to reduce (contract) the size of the uterus. These medicines may be given if you have a lot of bleeding. If you have Rh negative blood and your baby was Rh positive, you will need a shot of a medicine called Rh immunoglobulinto protect your future babies from Rh blood problems. "Rh-negative" and "Rh-positive" refer to whether or not the blood has a specific protein found on the surface of red blood cells (Rh factor). Follow these instructions at home: Medicines   Take over-the-counter and prescription medicines only as  told by your health care provider.  If you were prescribed antibiotic medicine, take it as told by your health care provider. Do not stop taking the antibiotic even if you start to feel better.  Do not take NSAIDs, such as aspirin and ibuprofen, unless they are approved by your health care provider. These medicines can cause bleeding. Activity  Rest as directed. Ask your health care provider what activities are safe for you.  Have someone help with home and family responsibilities during this time. General instructions  Keep track of the number of sanitary pads you use each day and how soaked (saturated) they are. Write down this information.  Monitor the amount of tissue or blood clots that you pass from your vagina. Save any large amounts of tissue for your health care provider to examine.  Do not use tampons, douche, or have sex until your health care provider approves.  To help you and your partner with the process of grieving, talk with your health care provider or seek counseling.  When you are ready, meet with your health care provider to discuss any important steps you should take for your health. Also, discuss steps you should take to have a healthy pregnancy in the future.  Keep all follow-up visits as told by your health care provider. This is important. Where to find   more information  The [employer]nec[employer]: www.acog.org  [place] Department of Health and Human Services Office of Women's Health: www.womenshealth.gov Contact a health care provider if:  You have a fever or chills.  You have a foul smelling vaginal discharge.  You have more bleeding instead of less. Get help right away if:  You have severe cramps or pain in your back or abdomen.  You pass blood clots or tissue from your vagina that is walnut-sized or larger.  You soak more than 1 regular sanitary pad in an hour.  You become light-headed or weak.  You pass out.  You  have feelings of sadness that take over your thoughts, or you have thoughts of hurting yourself. Summary  Most miscarriages happen in the first 3 months of pregnancy. Sometimes miscarriage happens before a woman even knows that she is pregnant.  Follow your health care provider's instruction for home care. Keep all follow-up appointments.  To help you and your partner with the process of grieving, talk with your health care provider or seek counseling. This information is not intended to replace advice given to you by your health care provider. Make sure you discuss any questions you have with your health care provider. Document Revised: 10/11/2018 Document Reviewed: 07/25/2016 Elsevier Patient Education  2020 [employer]                        Safe Medications in Pregnancy    Acne: Benzoyl Peroxide Salicylic Acid  Backache/Headache: Tylenol: 2 regular strength every 4 hours OR              2 Extra strength every 6 hours  Colds/Coughs/Allergies: Benadryl (alcohol free) 25 mg every 6 hours as needed Breath right strips Claritin Cepacol throat lozenges Chloraseptic throat spray Cold-Eeze- up to three times per day Cough drops, alcohol free Flonase (by prescription only) Guaifenesin Mucinex Robitussin DM (plain only, alcohol free) Saline nasal spray/drops Sudafed (pseudoephedrine) & Actifed ** use only after [redacted] weeks gestation and if you do not have high blood pressure Tylenol Vicks Vaporub Zinc lozenges Zyrtec   Constipation: Colace Ducolax suppositories Fleet enema Glycerin suppositories Metamucil Milk of magnesia Miralax Senokot Smooth move tea  Diarrhea: Kaopectate Imodium A-D  *NO pepto Bismol  Hemorrhoids: Anusol Anusol HC Preparation H Tucks  Indigestion: Tums Maalox Mylanta Zantac  Pepcid  Insomnia: Benadryl (alcohol free) 25mg every 6 hours as needed Tylenol PM Unisom, no Gelcaps  Leg Cramps: Tums MagGel  Nausea/Vomiting:   Bonine Dramamine Emetrol Ginger extract Sea bands Meclizine  Nausea medication to take during pregnancy:  Unisom (doxylamine succinate 25 mg tablets) Take one tablet daily at bedtime. If symptoms are not adequately controlled, the dose can be increased to a maximum recommended dose of two tablets daily (1/2 tablet in the morning, 1/2 tablet mid-afternoon and one at bedtime). Vitamin B6 100mg tablets. Take one tablet twice a day (up to 200 mg per day).  Skin Rashes: Aveeno products Benadryl cream or 25mg every 6 hours as needed Calamine Lotion 1% cortisone cream  Yeast infection: Gyne-lotrimin 7 Monistat 7   **If taking multiple medications, please check labels to avoid duplicating the same active ingredients **take medication as directed on the label ** Do not exceed 4000 mg of tylenol in 24 hours **Do not take medications that contain aspirin or ibuprofen          Prenatal Care Providers             [facility] @ [facility] - accepts patients without insurance  Phone: [phone]  [facility]   Phone: [phone]  [facility] [place]       Phone: [phone]            [facility] [place]     Phone: [phone]          [facility] [place]   Phone: [phone]  [facility] - accepts patients without insurance  Phone: [phone]  [facility] Phone: [phone]     [facility] - accepts patients without insurance Phone: [phone]  [facility]  Phone: [phone]  [facility] Phone: [phone]  [facility] Phone: [phone]  [facility] Phone: [phone]  [facility] Phone: [phone]  [facility]  Phone: [phone]   

## 2020-04-12 ENCOUNTER — Other Ambulatory Visit: Payer: Self-pay

## 2020-04-12 ENCOUNTER — Other Ambulatory Visit: Payer: Medicaid Other

## 2020-04-12 ENCOUNTER — Inpatient Hospital Stay (HOSPITAL_COMMUNITY)
Admission: EM | Admit: 2020-04-12 | Discharge: 2020-04-12 | Disposition: A | Discharge status: Home / Self Care | Payer: Medicaid Other | Attending: Obstetrics & Gynecology | Admitting: Obstetrics & Gynecology

## 2020-04-12 DIAGNOSIS — O3680X0 Pregnancy with inconclusive fetal viability, not applicable or unspecified: Secondary | ICD-10-CM | POA: Diagnosis not present

## 2020-04-12 DIAGNOSIS — Z202 Contact with and (suspected) exposure to infections with a predominantly sexual mode of transmission: Secondary | ICD-10-CM | POA: Diagnosis not present

## 2020-04-12 DIAGNOSIS — Z3A00 Weeks of gestation of pregnancy not specified: Secondary | ICD-10-CM | POA: Diagnosis not present

## 2020-04-12 DIAGNOSIS — Z79899 Other long term (current) drug therapy: Secondary | ICD-10-CM | POA: Diagnosis not present

## 2020-04-12 DIAGNOSIS — O99019 Anemia complicating pregnancy, unspecified trimester: Secondary | ICD-10-CM | POA: Diagnosis not present

## 2020-04-12 DIAGNOSIS — Z8616 Personal history of COVID-19: Secondary | ICD-10-CM | POA: Insufficient documentation

## 2020-04-12 DIAGNOSIS — D573 Sickle-cell trait: Secondary | ICD-10-CM | POA: Diagnosis not present

## 2020-04-12 LAB — GC/CHLAMYDIA PROBE AMP (~~LOC~~) NOT AT ARMC
Chlamydia: POSITIVE — AB
Comment: NEGATIVE
Comment: NORMAL
Neisseria Gonorrhea: NEGATIVE

## 2020-04-12 LAB — HCG, QUANTITATIVE, PREGNANCY: hCG, Beta Chain, Quant, S: 2193 m[IU]/mL — ABNORMAL HIGH (ref <5)

## 2020-04-12 MED ORDER — AZITHROMYCIN 250 MG PO TABS
1000.0000 mg | ORAL_TABLET | Freq: Once | ORAL | Status: AC
  Administered 2020-04-12: 1000 mg via ORAL
  Filled 2020-04-12: qty 4

## 2020-04-12 NOTE — MAU Note (Signed)
.   Heidi Martin is a 19 y.o.  here in MAU reporting: Sent from ED for treatment for chlamydia. Early pregnant unsure of dating.   Pain score: 0 Vitals:   04/12/20 1857 04/12/20 2047  BP: 109/65 121/70  Pulse: 68 65  Resp: 17 15  Temp: 98.2 F (36.8 C) 98.1 F (36.7 C)  SpO2: 100% 100%

## 2020-04-12 NOTE — MAU Provider Note (Signed)
Chief Complaint: Vaginal Discharge   None     SUBJECTIVE HPI: Heidi Martin is a 19 y.o. G2P0010 who presents to maternity admissions for treatment of chlamydia. She was seen on 10/8 with abdominal pain and bleeding; she had a positive pregnancy test. US showed nothing in the uterus. She was supposed to return to [facility] this am for a repeat quant; she did not go. She is unsure whether she will keep the pregnancy. She has no pain or bleeding. Partner is incarcerated.   Past Medical History:  Diagnosis Date  . COVID-19   . Irritable bowel syndrome (IBS)   . Sickle cell trait [facility])    Past Surgical History:  Procedure Laterality Date  . NO PAST SURGERIES     Social History   Socioeconomic History  . Marital status: Single    Spouse name: Not on file  . Number of children: Not on file  . Years of education: Not on file  . Highest education level: Not on file  Occupational History  . Not on file  Tobacco Use  . Smoking status: Never Smoker  . Smokeless tobacco: Never Used  Vaping Use  . Vaping Use: Never used  Substance and Sexual Activity  . Alcohol use: No  . Drug use: No  . Sexual activity: Not Currently  Other Topics Concern  . Not on file  Social History Narrative  . Not on file   Social Determinants of Health   Financial Resource Strain:   . Difficulty of Paying Living Expenses: Not on file  Food Insecurity:   . Worried About Programme researcher, broadcasting/film/video in the Last Year: Not on file  . Ran Out of Food in the Last Year: Not on file  Transportation Needs:   . Lack of Transportation (Medical): Not on file  . Lack of Transportation (Non-Medical): Not on file  Physical Activity:   . Days of Exercise per Week: Not on file  . Minutes of Exercise per Session: Not on file  Stress:   . Feeling of Stress : Not on file  Social Connections:   . Frequency of Communication with Friends and Family: Not on file  . Frequency of Social Gatherings with Friends and Family: Not on  file  . Attends Religious Services: Not on file  . Active Member of Clubs or Organizations: Not on file  . Attends Banker Meetings: Not on file  . Marital Status: Not on file  Intimate Partner Violence:   . Fear of Current or Ex-Partner: Not on file  . Emotionally Abused: Not on file  . Physically Abused: Not on file  . Sexually Abused: Not on file   No current facility-administered medications on file prior to encounter.   Current Outpatient Medications on File Prior to Encounter  Medication Sig Dispense Refill  . docusate sodium (COLACE) 100 MG capsule Take 1 capsule (100 mg total) by mouth every 12 (twelve) hours. 20 capsule 0  . ondansetron (ZOFRAN ODT) 4 MG disintegrating tablet Take 1 tablet (4 mg total) by mouth every 8 (eight) hours as needed for nausea or vomiting. 8 tablet 0  . oxyCODONE-acetaminophen (PERCOCET/ROXICET) 5-325 MG tablet Take 1 tablet by mouth every 6 (six) hours as needed for severe pain. 10 tablet 0  . [DISCONTINUED] dicyclomine (BENTYL) 20 MG tablet Take 1 tablet (20 mg total) by mouth 2 (two) times daily. (Patient not taking: Reported on 02/14/2020) 20 tablet 0  . [DISCONTINUED] metoCLOPramide (REGLAN) 10 MG tablet Take  1 tablet (10 mg total) by mouth 3 (three) times daily between meals as needed for nausea. (Patient not taking: Reported on 02/14/2020) 30 tablet 0  . [DISCONTINUED] promethazine (PHENERGAN) 12.5 MG tablet Take 1-2 tablets (12.5-25 mg total) by mouth at bedtime. (Patient not taking: Reported on 02/14/2020) 30 tablet 0   Allergies  Allergen Reactions  . Milk [Lac Bovis] Nausea And Vomiting    Stomach cramping    ROS:  Review of Systems  Gastrointestinal: Negative for abdominal pain.  Genitourinary: Negative for vaginal bleeding.    I have reviewed patient's Past Medical Hx, Surgical Hx, Family Hx, Social Hx, medications and allergies.   Physical Exam   Patient Vitals for the past 24 hrs:  BP Temp Temp src Pulse Resp SpO2  Height Weight  04/12/20 2047 121/70 98.1 F (36.7 C) Oral 65 15 100 % -- --  04/12/20 1857 109/65 98.2 F (36.8 C) Oral 68 17 100 % 5\' 5"  (1.651 m) 74.8 kg   Results for orders placed or performed during the hospital encounter of 04/12/20 (from the past 48 hour(s))  hCG, quantitative, pregnancy     Status: Abnormal   Collection Time: 04/12/20  9:13 PM  Result Value Ref Range   hCG, Beta Chain, Quant, S 2,193 (H) <5 mIU/mL    Comment:          GEST. AGE      CONC.  (mIU/mL)   <=1 WEEK        5 - 50     2 WEEKS       50 - 500     3 WEEKS       100 - 10,000     4 WEEKS     1,000 - 30,000     5 WEEKS     3,500 - 115,000   6-8 WEEKS     12,000 - 270,000    12 WEEKS     15,000 - 220,000        FEMALE AND NON-PREGNANT FEMALE:     LESS THAN 5 mIU/mL Performed at [facility], [address], [place], [place]     Physical Exam Constitutional:      General: She is not in acute distress.    Appearance: Normal appearance. She is not ill-appearing, toxic-appearing or diaphoretic.  HENT:     Head: Normocephalic.  Eyes:     Pupils: Pupils are equal, round, and reactive to light.  Neurological:     Mental Status: She is alert and oriented to person, place, and time.  Psychiatric:        Behavior: Behavior normal.    MDM  -Transferred from ED -1 gram of azithromycin given in MAU -Stat Hcg ordered, patient unable to stay for results. She is without pain or bleeding. I will call patient with results, she is aware that she will need serial quants if abnormal.   Quant 10/8: 387 Quant 10/11: 2, 193  ASSESSMENT MSE Complete F/u 12/8 ordered- attempted to call patient to inform- no answer, will try back again later  Pregnancy of unknown location.  Chlamydia treated   PLAN   Strict ectopic precautions Return to MAU if symptoms worsen F/u in 7 days Pelvic rest.   Madelaine Whipple, [place], NP Marqui Formby, Harolyn Rutherford, NP 04/14/2020 9:02 AM

## 2020-04-12 NOTE — ED Notes (Signed)
Pt seen by PA, report called to [facility] charge, transported called pt will got to MAU

## 2020-04-12 NOTE — ED Provider Notes (Signed)
MSE was initiated and I personally evaluated the patient and placed orders (if any) at  7:03 PM on April 12, 2020.  Patient is a 19 year old female G2 P0010 who presents to the emergency department due to a positive chlamydia test.  Patient was evaluated 3 days ago due to lower abdominal pain.  She had a positive home pregnancy test prior to the onset of her symptoms.  She states that her abdominal pain is resolved.  She still notes some intermittent nausea without vomiting.  She reports increased vaginal discharge for the past week.  No fevers, chills, cough, rhinorrhea, chest pain, shortness of breath.  She states that her first pregnancy terminated in June of this year.  Her last menstrual period was in early September and was normal, per patient.  Patient was discussed with Victorino Dike at [facility] will accept the transfer of the patient at this time.  The patient appears stable so that the remainder of the MSE may be completed by another provider.   Placido Sou, PA-C 04/12/20 1919    Jacalyn Lefevre, MD 04/12/20 1941

## 2020-04-14 ENCOUNTER — Other Ambulatory Visit: Payer: Self-pay

## 2020-04-14 ENCOUNTER — Other Ambulatory Visit: Payer: Self-pay | Admitting: Obstetrics and Gynecology

## 2020-04-14 DIAGNOSIS — O3680X0 Pregnancy with inconclusive fetal viability, not applicable or unspecified: Secondary | ICD-10-CM

## 2020-04-14 NOTE — Progress Notes (Signed)
[place] order changed for patient. Pt made aware she will be contacted for [place], she voices understanding.

## 2020-04-21 ENCOUNTER — Ambulatory Visit: Payer: Medicaid Other

## 2020-04-26 ENCOUNTER — Ambulatory Visit: Payer: Medicaid Other

## 2020-05-08 ENCOUNTER — Inpatient Hospital Stay (HOSPITAL_COMMUNITY): Payer: Medicaid Other

## 2020-05-08 ENCOUNTER — Other Ambulatory Visit: Payer: Self-pay

## 2020-05-08 ENCOUNTER — Inpatient Hospital Stay (HOSPITAL_COMMUNITY)
Admission: AD | Admit: 2020-05-08 | Discharge: 2020-05-08 | Disposition: A | Discharge status: Home / Self Care | Payer: Medicaid Other | Attending: Obstetrics and Gynecology | Admitting: Obstetrics and Gynecology

## 2020-05-08 DIAGNOSIS — R102 Pelvic and perineal pain: Secondary | ICD-10-CM | POA: Insufficient documentation

## 2020-05-08 DIAGNOSIS — O26891 Other specified pregnancy related conditions, first trimester: Secondary | ICD-10-CM | POA: Diagnosis not present

## 2020-05-08 DIAGNOSIS — Z3490 Encounter for supervision of normal pregnancy, unspecified, unspecified trimester: Secondary | ICD-10-CM | POA: Diagnosis not present

## 2020-05-08 DIAGNOSIS — Z3A08 8 weeks gestation of pregnancy: Secondary | ICD-10-CM | POA: Diagnosis not present

## 2020-05-08 DIAGNOSIS — R110 Nausea: Secondary | ICD-10-CM

## 2020-05-08 DIAGNOSIS — O3411 Maternal care for benign tumor of corpus uteri, first trimester: Secondary | ICD-10-CM | POA: Diagnosis not present

## 2020-05-08 LAB — HCG, QUANTITATIVE, PREGNANCY: hCG, Beta Chain, Quant, S: 152226 m[IU]/mL — ABNORMAL HIGH (ref <5)

## 2020-05-08 LAB — CBC
HCT: 35.1 % — ABNORMAL LOW (ref 36.0–46.0)
Hemoglobin: 11.4 g/dL — ABNORMAL LOW (ref 12.0–15.0)
MCH: 25.7 pg — ABNORMAL LOW (ref 26.0–34.0)
MCHC: 32.5 g/dL (ref 30.0–36.0)
MCV: 79.2 fL — ABNORMAL LOW (ref 80.0–100.0)
Platelets: 241 10*3/uL (ref 150–400)
RBC: 4.43 MIL/uL (ref 3.87–5.11)
RDW: 14.6 % (ref 11.5–15.5)
WBC: 8.8 10*3/uL (ref 4.0–10.5)
nRBC: 0 % (ref 0.0–0.2)

## 2020-05-08 NOTE — MAU Provider Note (Signed)
History     CSN: [id number]  Arrival date and time: 05/08/20 0006   First Provider Initiated Contact with Patient 05/08/20 0126      Chief Complaint  Patient presents with  . Pelvic Pain   Heidi Martin is a 19 y.o. G2P0010 [redacted]w[redacted]d who presents today with lower abdominal cramps that she has had for about one week. She denies any VB. She had 2 HCG levels that showed an appropriate rise between first and second check. She had one [place] that did not show anything at that time. She is scheduled for FU [place] on 05/11/2020, but could not wait for that appt.   Pelvic Pain The patient's primary symptoms include pelvic pain. The patient's pertinent negatives include no vaginal discharge. This is a new problem. The current episode started in the past 7 days. The problem occurs intermittently. The problem has been unchanged. The problem affects both sides. She is pregnant. Associated symptoms include nausea. Pertinent negatives include no chills, dysuria, fever, frequency or vomiting. The vaginal discharge was normal. There has been no bleeding. Nothing aggravates the symptoms. She has tried nothing for the symptoms. She uses nothing for contraception. Her menstrual history has been regular (LMP "beginning of September").    OB History    Gravida  2   Para      Term      Preterm      AB  1   Living        SAB  1   TAB      Ectopic      Multiple      Live Births              Past Medical History:  Diagnosis Date  . COVID-19   . Irritable bowel syndrome (IBS)   . Sickle cell trait [facility])     Past Surgical History:  Procedure Laterality Date  . NO PAST SURGERIES      No family history on file.  Social History   Tobacco Use  . Smoking status: Never Smoker  . Smokeless tobacco: Never Used  Vaping Use  . Vaping Use: Never used  Substance Use Topics  . Alcohol use: No  . Drug use: No    Allergies:  Allergies  Allergen Reactions  . Milk [Lac Bovis] Nausea And Vomiting     Stomach cramping    Medications Prior to Admission  Medication Sig Dispense Refill Last Dose  . docusate sodium (COLACE) 100 MG capsule Take 1 capsule (100 mg total) by mouth every 12 (twelve) hours. 20 capsule 0     Review of Systems  Constitutional: Negative for chills and fever.  Gastrointestinal: Positive for nausea. Negative for vomiting.  Genitourinary: Positive for pelvic pain. Negative for dysuria, frequency, vaginal bleeding and vaginal discharge.   Physical Exam   Blood pressure 120/74, pulse 80, temperature 98.3 F (36.8 C), resp. rate 15, height 5\' 5"  (1.651 m), weight 71.2 kg, last menstrual period 03/09/2020, SpO2 100 %, unknown if currently breastfeeding.  Physical Exam Vitals and nursing note reviewed.  Constitutional:      General: She is not in acute distress. HENT:     Head: Normocephalic.  Cardiovascular:     Rate and Rhythm: Normal rate.  Pulmonary:     Effort: Pulmonary effort is normal.  Abdominal:     Palpations: Abdomen is soft.     Tenderness: There is no abdominal tenderness.  Skin:    General: Skin is warm and  dry.  Neurological:     Mental Status: She is alert and oriented to person, place, and time.  Psychiatric:        Mood and Affect: Mood normal.        Behavior: Behavior normal.     Results for orders placed or performed during the hospital encounter of 05/08/20 (from the past 24 hour(s))  CBC     Status: Abnormal   Collection Time: 05/08/20  1:30 AM  Result Value Ref Range   WBC 8.8 4.0 - 10.5 K/uL   RBC 4.43 3.87 - 5.11 MIL/uL   Hemoglobin 11.4 (L) 12.0 - 15.0 g/dL   HCT 27.0 (L) 36 - 46 %   MCV 79.2 (L) 80.0 - 100.0 fL   MCH 25.7 (L) 26.0 - 34.0 pg   MCHC 32.5 30.0 - 36.0 g/dL   RDW 78.6 75.4 - 49.2 %   Platelets 241 150 - 400 K/uL   nRBC 0.0 0.0 - 0.2 %   US OB Transvaginal  Result Date: 05/08/2020 CLINICAL DATA:  Initial evaluation for pelvic cramping, pregnancy of unknown location. EXAM: TRANSVAGINAL OB ULTRASOUND  TECHNIQUE: Transvaginal ultrasound was performed for complete evaluation of the gestation as well as the maternal uterus, adnexal regions, and pelvic cul-de-sac. COMPARISON:  Prior ultrasound from 04/09/2020 FINDINGS: Intrauterine gestational sac: Single Yolk sac:  Present Embryo:  Present Cardiac Activity: Present Heart Rate: 171 bpm CRL: I 0.6 mm   8 w 2 d                  [place] EDC: 12/16/2020 Subchorionic hemorrhage:  None visualized. Maternal uterus/adnexae: Ovaries are normal bilaterally. Small corpus luteal cyst noted on the right. No free fluid or adnexal mass. IMPRESSION: 1. Single viable intrauterine pregnancy as above, estimated gestational age [redacted] weeks and 2 days by crown-rump length, with ultrasound EDC of 12/16/2020. No complication. 2. Small degenerating right ovarian corpus luteal cyst. 3. No other acute maternal uterine or adnexal abnormality. Electronically Signed   By: Rise Mu M.D.   On: 05/08/2020 02:08    MAU Course  Procedures  MDM   Assessment and Plan   1. Pelvic pain in pregnancy, antepartum, first trimester   2. [redacted] weeks gestation of pregnancy   3. Intrauterine pregnancy    DC home Comfort measures reviewed  1stTrimester precautions  RX: none, list of safe OTC meds given to patient.  Return to MAU as needed Start prenatal care Message sent to the clinic to cancel [place] appt on 05/11/2020 and schedule patient for a new OB visit    Follow-up Information    [facility] Follow up.   Specialty: Obstetrics and Gynecology Contact information: [address] [place] [address] [phone]               Thressa Sheller DNP, CNM  05/08/20  1:31 AM

## 2020-05-08 NOTE — MAU Note (Signed)
Pt reports "really bad" abd cramping x one week. Denies bleeding. White vaginal discharge.

## 2020-05-08 NOTE — Discharge Instructions (Signed)

## 2020-05-08 NOTE — ED Triage Notes (Signed)
Emergency Medicine Provider OB Triage Evaluation Note  Heidi Martin is a 19 y.o. female, G2P0010, at Unknown gestation who presents to the emergency department with complaints of abdominal cramping for 1 week with nausea and vaginal discharge.  LMP was at the beginning of September.  She is sexually active with 1 female partner.  She was treated for chlamydia and finished the entire course of antibiotics when she was seen in the ER in October.  She also reports that her partner completed his entire course of antibiotics and they waited for 7 days after completing the antibiotic biotics prior to engaging in sexual intercourse.  No fevers, chills, vaginal bleeding.  Review of  Systems  Positive: Abdominal pain, nausea, vaginal discharge Negative: Fever, chills, vomiting, vaginal bleeding  Physical Exam  BP 111/69    Pulse 82    Temp 98.4 F (36.9 C)    Resp 16    LMP 03/09/2020 Comment: neg preg test 03/30/20   SpO2 100%  General: Awake, no distress  HEENT: Atraumatic  Resp: Normal effort  Cardiac: Normal rate Abd: Nondistended, nontender  MSK: Moves all extremities without difficulty Neuro: Speech clear  Medical Decision Making  Pt evaluated for pregnancy concern and is stable for transfer to MAU. Pt is in agreement with plan for transfer.  12:59 AM Discussed with MAU APP, Heather, who accepts patient in transfer.  Clinical Impression  No diagnosis found.     Frederik Pear A, PA-C 05/08/20 [phone]

## 2020-05-08 NOTE — ED Triage Notes (Signed)
Pt st's she is 7 weeks preg and having abd cramping.  Denies any vag. bleeding

## 2020-05-11 ENCOUNTER — Ambulatory Visit: Admission: RE | Admit: 2020-05-11 | Payer: Medicaid Other | Source: Ambulatory Visit

## 2020-05-15 ENCOUNTER — Emergency Department (HOSPITAL_COMMUNITY)
Admission: EM | Admit: 2020-05-15 | Discharge: 2020-05-15 | Disposition: A | Discharge status: Home / Self Care | Payer: Medicaid Other | Attending: Emergency Medicine | Admitting: Emergency Medicine

## 2020-05-15 ENCOUNTER — Encounter (HOSPITAL_COMMUNITY): Payer: Self-pay | Admitting: Emergency Medicine

## 2020-05-15 ENCOUNTER — Other Ambulatory Visit: Payer: Self-pay

## 2020-05-15 DIAGNOSIS — O26891 Other specified pregnancy related conditions, first trimester: Secondary | ICD-10-CM | POA: Diagnosis not present

## 2020-05-15 DIAGNOSIS — R519 Headache, unspecified: Secondary | ICD-10-CM | POA: Diagnosis not present

## 2020-05-15 DIAGNOSIS — Z3A09 9 weeks gestation of pregnancy: Secondary | ICD-10-CM | POA: Diagnosis not present

## 2020-05-15 DIAGNOSIS — Z8669 Personal history of other diseases of the nervous system and sense organs: Secondary | ICD-10-CM | POA: Insufficient documentation

## 2020-05-15 DIAGNOSIS — R42 Dizziness and giddiness: Secondary | ICD-10-CM | POA: Diagnosis not present

## 2020-05-15 DIAGNOSIS — Z8616 Personal history of COVID-19: Secondary | ICD-10-CM | POA: Insufficient documentation

## 2020-05-15 LAB — CBC WITH DIFFERENTIAL/PLATELET
Abs Immature Granulocytes: 0.04 10*3/uL (ref 0.00–0.07)
Basophils Absolute: 0 10*3/uL (ref 0.0–0.1)
Basophils Relative: 0 %
Eosinophils Absolute: 0 10*3/uL (ref 0.0–0.5)
Eosinophils Relative: 0 %
HCT: 33.8 % — ABNORMAL LOW (ref 36.0–46.0)
Hemoglobin: 11 g/dL — ABNORMAL LOW (ref 12.0–15.0)
Immature Granulocytes: 0 %
Lymphocytes Relative: 15 %
Lymphs Abs: 1.5 10*3/uL (ref 0.7–4.0)
MCH: 25.8 pg — ABNORMAL LOW (ref 26.0–34.0)
MCHC: 32.5 g/dL (ref 30.0–36.0)
MCV: 79.3 fL — ABNORMAL LOW (ref 80.0–100.0)
Monocytes Absolute: 0.6 10*3/uL (ref 0.1–1.0)
Monocytes Relative: 6 %
Neutro Abs: 8.1 10*3/uL — ABNORMAL HIGH (ref 1.7–7.7)
Neutrophils Relative %: 79 %
Platelets: 249 10*3/uL (ref 150–400)
RBC: 4.26 MIL/uL (ref 3.87–5.11)
RDW: 14.7 % (ref 11.5–15.5)
WBC: 10.3 10*3/uL (ref 4.0–10.5)
nRBC: 0 % (ref 0.0–0.2)

## 2020-05-15 LAB — I-STAT BETA HCG BLOOD, ED (MC, WL, AP ONLY): I-stat hCG, quantitative: >2000 m[IU]/mL — ABNORMAL HIGH (ref <5)

## 2020-05-15 LAB — BASIC METABOLIC PANEL
Anion gap: 11 (ref 5–15)
BUN: 7 mg/dL (ref 6–20)
CO2: 21 mmol/L — ABNORMAL LOW (ref 22–32)
Calcium: 9.4 mg/dL (ref 8.9–10.3)
Chloride: 103 mmol/L (ref 98–111)
Creatinine, Ser: 0.68 mg/dL (ref 0.44–1.00)
GFR, Estimated: >60 mL/min (ref >60)
Glucose, Bld: 91 mg/dL (ref 70–99)
Potassium: 3.5 mmol/L (ref 3.5–5.1)
Sodium: 135 mmol/L (ref 135–145)

## 2020-05-15 MED ORDER — SODIUM CHLORIDE 0.9 % IV BOLUS
500.0000 mL | Freq: Once | INTRAVENOUS | Status: AC
  Administered 2020-05-15: 500 mL via INTRAVENOUS

## 2020-05-15 MED ORDER — METOCLOPRAMIDE HCL 5 MG/ML IJ SOLN
10.0000 mg | Freq: Once | INTRAMUSCULAR | Status: AC
  Administered 2020-05-15: 10 mg via INTRAVENOUS
  Filled 2020-05-15: qty 2

## 2020-05-15 MED ORDER — DIPHENHYDRAMINE HCL 50 MG/ML IJ SOLN
25.0000 mg | Freq: Once | INTRAMUSCULAR | Status: AC
  Administered 2020-05-15: 25 mg via INTRAVENOUS
  Filled 2020-05-15: qty 1

## 2020-05-15 NOTE — Discharge Instructions (Signed)
Please read and follow all provided instructions.  Your diagnoses today include:  1. Acute nonintractable headache, unspecified headache type     Tests performed today include:  Vital signs. See below for your results today.   Medications:  In the Emergency Department you received:  Reglan - antinausea/headache medication  Benadryl - antihistamine to counteract potential side effects of reglan   Take any prescribed medications only as directed.  Additional information:  Follow any educational materials contained in this packet.  You are having a headache. No specific cause was found today for your headache. It may have been a migraine or other cause of headache. Stress, anxiety, fatigue, and depression are common triggers for headaches.   Your headache today does not appear to be life-threatening or require hospitalization, but often the exact cause of headaches is not determined in the emergency department. Therefore, follow-up with your doctor is very important to find out what may have caused your headache and whether or not you need any further diagnostic testing or treatment.   Sometimes headaches can appear benign (not harmful), but then more serious symptoms can develop which should prompt an immediate re-evaluation by your doctor or the emergency department.  BE VERY CAREFUL not to take multiple medicines containing Tylenol (also called acetaminophen). Doing so can lead to an overdose which can damage your liver and cause liver failure and possibly death.   Follow-up instructions: Please follow-up with your primary care provider in the next 3 days for further evaluation of your symptoms.   Return instructions:   Please return to the Emergency Department if you experience worsening symptoms.  Return if the medications do not resolve your headache, if it recurs, or if you have multiple episodes of vomiting or cannot keep down fluids.  Return if you have a change from the  usual headache.  RETURN IMMEDIATELY IF you:  Develop a sudden, severe headache  Develop confusion or become poorly responsive or faint  Develop a fever above 100.2F or problem breathing  Have a change in speech, vision, swallowing, or understanding  Develop new weakness, numbness, tingling, incoordination in your arms or legs  Have a seizure  Please return if you have any other emergent concerns.  Additional Information:  Your vital signs today were: BP 107/65   Pulse 64   Temp 98.9 F (37.2 C) (Oral)   Resp 19   Ht 5\' 6"  (1.676 m)   Wt 75 kg   LMP 03/09/2020 (Exact Date) Comment: neg preg test 03/30/20  SpO2 100%   BMI 26.69 kg/m  If your blood pressure (BP) was elevated above 135/85 this visit, please have this repeated by your doctor within one month. --------------

## 2020-05-15 NOTE — ED Provider Notes (Signed)
[facility] EMERGENCY DEPARTMENT Provider Note   CSN: [id number] Arrival date & time: 05/15/20  1905     History Chief Complaint  Patient presents with  . Migraine    Heidi Martin is a 19 y.o. female.  Patient at approximately 9 weeks and 2 days gestation presents the emergency department with complaint of headache.  She states that she has a history of migraines.  Patient suffered an orbital floor fracture in September 2021.  She reports having every day headaches.  She is unable to tell me if her headaches worsened around the time of her head injury.  She has associated nausea but no vomiting.  She has been taking over-the-counter medications without improvement.  No confusion, vision loss or changes.  Occasionally she will feel dizzy when she stands up.  Headache is frontal on both sides of the head and radiates to the back of her head.  No weakness, numbness, or tingling in her extremities.        Past Medical History:  Diagnosis Date  . COVID-19   . Irritable bowel syndrome (IBS)   . Sickle cell trait (HCC)     There are no problems to display for this patient.   Past Surgical History:  Procedure Laterality Date  . NO PAST SURGERIES       OB History    Gravida  2   Para      Term      Preterm      AB  1   Living        SAB  1   TAB      Ectopic      Multiple      Live Births              No family history on file.  Social History   Tobacco Use  . Smoking status: Never Smoker  . Smokeless tobacco: Never Used  Vaping Use  . Vaping Use: Never used  Substance Use Topics  . Alcohol use: No  . Drug use: No    Home Medications Prior to Admission medications   Medication Sig Start Date End Date Taking? Authorizing Provider  docusate sodium (COLACE) 100 MG capsule Take 1 capsule (100 mg total) by mouth every 12 (twelve) hours. 03/30/20   Renne Crigler, PA-C  dicyclomine (BENTYL) 20 MG tablet Take 1 tablet (20 mg  total) by mouth 2 (two) times daily. Patient not taking: Reported on 02/14/2020 11/14/19 02/14/20  Renne Crigler, PA-C  metoCLOPramide (REGLAN) 10 MG tablet Take 1 tablet (10 mg total) by mouth 3 (three) times daily between meals as needed for nausea. Patient not taking: Reported on 02/14/2020 10/04/19 02/14/20  Leftwich-Kirby, Wilmer Floor, CNM  promethazine (PHENERGAN) 12.5 MG tablet Take 1-2 tablets (12.5-25 mg total) by mouth at bedtime. Patient not taking: Reported on 02/14/2020 10/04/19 02/14/20  Leftwich-Kirby, Wilmer Floor, CNM    Allergies    Milk [lac bovis]  Review of Systems   Review of Systems  Constitutional: Negative for fever.  HENT: Negative for congestion, dental problem, rhinorrhea and sinus pressure.   Eyes: Negative for photophobia, discharge, redness and visual disturbance.  Respiratory: Negative for shortness of breath.   Cardiovascular: Negative for chest pain.  Gastrointestinal: Positive for nausea. Negative for vomiting.  Musculoskeletal: Negative for gait problem, neck pain and neck stiffness.  Skin: Negative for rash.  Neurological: Positive for headaches. Negative for syncope, speech difficulty, weakness, light-headedness and numbness.  Psychiatric/Behavioral: Negative  for confusion.    Physical Exam Updated Vital Signs BP 99/71 (BP Location: Left Arm)   Pulse 76   Temp 98.9 F (37.2 C) (Oral)   Resp 18   Ht 5\' 6"  (1.676 m)   Wt 75 kg   LMP 03/09/2020 (Exact Date) Comment: neg preg test 03/30/20  SpO2 100%   BMI 26.69 kg/m   Physical Exam Vitals and nursing note reviewed.  Constitutional:      Appearance: She is well-developed.  HENT:     Head: Normocephalic and atraumatic.     Right Ear: Tympanic membrane, ear canal and external ear normal.     Left Ear: Tympanic membrane, ear canal and external ear normal.     Nose: Nose normal.     Mouth/Throat:     Pharynx: Uvula midline.  Eyes:     General: Lids are normal.     Extraocular Movements:     Right eye: No  nystagmus.     Left eye: No nystagmus.     Conjunctiva/sclera: Conjunctivae normal.     Pupils: Pupils are equal, round, and reactive to light.     Comments: Extraocular muscles are completely intact without blocks to motion.  Cardiovascular:     Rate and Rhythm: Normal rate and regular rhythm.  Pulmonary:     Effort: Pulmonary effort is normal.     Breath sounds: Normal breath sounds.  Abdominal:     Palpations: Abdomen is soft.     Tenderness: There is no abdominal tenderness.  Musculoskeletal:     Cervical back: Normal range of motion and neck supple. No tenderness or bony tenderness.  Skin:    General: Skin is warm and dry.  Neurological:     Mental Status: She is alert and oriented to person, place, and time.     GCS: GCS eye subscore is 4. GCS verbal subscore is 5. GCS motor subscore is 6.     Cranial Nerves: No cranial nerve deficit.     Sensory: No sensory deficit.     Coordination: Coordination normal.     Gait: Gait normal.     Comments: Patient is able to stand up, walk across the room, and return without any difficulty.     ED Results / Procedures / Treatments   Labs (all labs ordered are listed, but only abnormal results are displayed) Labs Reviewed  CBC WITH DIFFERENTIAL/PLATELET - Abnormal; Notable for the following components:      Result Value   Hemoglobin 11.0 (*)    HCT 33.8 (*)    MCV 79.3 (*)    MCH 25.8 (*)    Neutro Abs 8.1 (*)    All other components within normal limits  BASIC METABOLIC PANEL - Abnormal; Notable for the following components:   CO2 21 (*)    All other components within normal limits  I-STAT BETA HCG BLOOD, ED ([facility], [facility], [facility] ONLY) - Abnormal; Notable for the following components:   I-stat hCG, quantitative >2,000.0 (*)    All other components within normal limits    EKG None  Radiology No results found.  Procedures Procedures (including critical care time)  Medications Ordered in ED Medications  metoCLOPramide (REGLAN)  injection 10 mg (has no administration in time range)  diphenhydrAMINE (BENADRYL) injection 25 mg (has no administration in time range)  sodium chloride 0.9 % bolus 500 mL (has no administration in time range)    ED Course  I have reviewed the triage vital signs and the  nursing notes.  Pertinent labs & imaging results that were available during my care of the patient were reviewed by me and considered in my medical decision making (see chart for details).  Patient seen and examined. Work-up initiated. Migraine cocktail ordered.  Will attempt to control symptoms.  Vital signs reviewed and are as follows: BP 99/71 (BP Location: Left Arm)   Pulse 76   Temp 98.9 F (37.2 C) (Oral)   Resp 18   Ht 5\' 6"  (1.676 m)   Wt 75 kg   LMP 03/09/2020 (Exact Date) Comment: neg preg test 03/30/20  SpO2 100%   BMI 26.69 kg/m   10:15 PM Pt rechecked. Says she feels good. Ready for d/c.   Patient urged to return with worsening symptoms or other concerns. Patient verbalized understanding and agrees with plan.      MDM Rules/Calculators/A&P                          Patient without high-risk features of headache including: sudden onset/thunderclap HA, no similar headache in past, altered mental status, accompanying seizure, headache with exertion, age > 27, history of immunocompromise, neck or shoulder pain, fever, use of anticoagulation, family history of spontaneous SAH, concomitant drug use, toxic exposure.   Patient has a normal complete neurological exam, normal vital signs, normal level of consciousness, no signs of meningismus, is well-appearing/non-toxic appearing, no signs of trauma.   Imaging with CT/MRI not indicated given history and physical exam findings.   No dangerous or life-threatening conditions suspected or identified by history, physical exam, and by work-up. No indications for hospitalization identified.   Final Clinical Impression(s) / ED Diagnoses Final diagnoses:  Acute  nonintractable headache, unspecified headache type    Rx / DC Orders ED Discharge Orders    None       44, PA-C 05/15/20 2216    2217, MD 05/15/20 [phone]

## 2020-05-15 NOTE — ED Triage Notes (Signed)
Patient reports migraine headache for 2 months with occasional nausea and mild photophobia , denies head injury . No fever or chills .

## 2020-05-21 ENCOUNTER — Telehealth: Payer: Self-pay | Admitting: Licensed Clinical Social Worker

## 2020-05-21 NOTE — Telephone Encounter (Signed)
Subjective: Heidi Martin is a G2P0010 at [redacted]w[redacted]d who presents to the [facility] today for contraception counseling.  She does not have a history of any mental health concerns. She is currently sexually active. She is currently using no method for birth control.  Patient states family as her support system.      Birth Control History: none  MDM Patient counseled on all options for birth control today including LARC. Patient desires additional counseling initiated for birth control   Assessment:  19 y.o. female considering additional counseling for birth control  Plan: [facility] appt schedule for 06/04/2020 at 2:00pm   Gwyndolyn Saxon, [place] 05/21/2020 11:44 AM

## 2020-05-24 ENCOUNTER — Telehealth: Payer: Self-pay | Admitting: Medical

## 2020-05-24 NOTE — Telephone Encounter (Signed)
Attempted to reach patient about her [employer]. Left a message for her to call the office.

## 2020-05-31 ENCOUNTER — Encounter: Payer: Medicaid Other | Admitting: Advanced Practice Midwife

## 2020-06-04 ENCOUNTER — Inpatient Hospital Stay (HOSPITAL_COMMUNITY): Payer: Medicaid Other

## 2020-06-04 ENCOUNTER — Inpatient Hospital Stay (HOSPITAL_COMMUNITY)
Admission: AD | Admit: 2020-06-04 | Discharge: 2020-06-05 | Disposition: A | Discharge status: Home / Self Care | Payer: Medicaid Other | Attending: Obstetrics and Gynecology | Admitting: Obstetrics and Gynecology

## 2020-06-04 ENCOUNTER — Other Ambulatory Visit: Payer: Self-pay

## 2020-06-04 DIAGNOSIS — O26851 Spotting complicating pregnancy, first trimester: Secondary | ICD-10-CM

## 2020-06-04 DIAGNOSIS — Z79899 Other long term (current) drug therapy: Secondary | ICD-10-CM | POA: Insufficient documentation

## 2020-06-04 DIAGNOSIS — R109 Unspecified abdominal pain: Secondary | ICD-10-CM | POA: Diagnosis not present

## 2020-06-04 DIAGNOSIS — Z3A12 12 weeks gestation of pregnancy: Secondary | ICD-10-CM | POA: Diagnosis not present

## 2020-06-04 DIAGNOSIS — O209 Hemorrhage in early pregnancy, unspecified: Secondary | ICD-10-CM

## 2020-06-04 DIAGNOSIS — R1030 Lower abdominal pain, unspecified: Secondary | ICD-10-CM | POA: Insufficient documentation

## 2020-06-04 DIAGNOSIS — O26891 Other specified pregnancy related conditions, first trimester: Secondary | ICD-10-CM | POA: Insufficient documentation

## 2020-06-04 DIAGNOSIS — O26899 Other specified pregnancy related conditions, unspecified trimester: Secondary | ICD-10-CM

## 2020-06-04 LAB — URINALYSIS, ROUTINE W REFLEX MICROSCOPIC
Bilirubin Urine: NEGATIVE
Glucose, UA: NEGATIVE mg/dL
Hgb urine dipstick: NEGATIVE
Ketones, ur: NEGATIVE mg/dL
Nitrite: NEGATIVE
Protein, ur: NEGATIVE mg/dL
Specific Gravity, Urine: 1.019 (ref 1.005–1.030)
pH: 5 (ref 5.0–8.0)

## 2020-06-04 LAB — WET PREP, GENITAL
Sperm: NONE SEEN
Trich, Wet Prep: NONE SEEN
Yeast Wet Prep HPF POC: NONE SEEN

## 2020-06-04 NOTE — MAU Provider Note (Signed)
History     CSN: [id number]  Arrival date and time: 06/04/20 1833   None     Chief Complaint  Patient presents with  . Abdominal Pain   Heidi Martin is a 19 y.o. G2P0010 at [redacted]w[redacted]d who has not started prenatal care.  She presents today for Abdominal Pain.  She states the pain started on Tuesday and is "on and off."  She states the pain is getting worse. She states she has tried tylenol without improvement of symptoms. She describes the pain as "a really bad period cramp."  She denies aggravating or relieving factors.  She also denies sex in the past 3 days.  Patient endorses vaginal discharge that is "creamy white" with a smell that she can not describe.  She reports some vaginal bleeding with wiping that she states is "like a light pink."    OB History    Gravida  2   Para      Term      Preterm      AB  1   Living        SAB  1   TAB      Ectopic      Multiple      Live Births              Past Medical History:  Diagnosis Date  . COVID-19   . Irritable bowel syndrome (IBS)   . Sickle cell trait [facility])     Past Surgical History:  Procedure Laterality Date  . NO PAST SURGERIES      No family history on file.  Social History   Tobacco Use  . Smoking status: Never Smoker  . Smokeless tobacco: Never Used  Vaping Use  . Vaping Use: Never used  Substance Use Topics  . Alcohol use: No  . Drug use: No    Allergies:  Allergies  Allergen Reactions  . Milk [Lac Bovis] Nausea And Vomiting    Stomach cramping    Medications Prior to Admission  Medication Sig Dispense Refill Last Dose  . docusate sodium (COLACE) 100 MG capsule Take 1 capsule (100 mg total) by mouth every 12 (twelve) hours. 20 capsule 0     Review of Systems  Constitutional: Negative for chills and fever.  Respiratory: Negative for cough and shortness of breath.   Gastrointestinal: Positive for abdominal pain (Cramping) and nausea. Negative for constipation, diarrhea and  vomiting.  Genitourinary: Positive for vaginal bleeding (Spotting) and vaginal discharge. Negative for difficulty urinating and dysuria.  Neurological: Positive for headaches (5/10). Negative for dizziness and light-headedness.   Physical Exam   Blood pressure 109/67, pulse 67, temperature 98.8 F (37.1 C), temperature source Oral, resp. rate 20, height 5\' 5"  (1.651 m), weight 70.2 kg, last menstrual period 03/09/2020, unknown if currently breastfeeding.  Physical Exam Vitals and nursing note reviewed.  Constitutional:      General: She is not in acute distress.    Appearance: Normal appearance. She is well-developed. She is not toxic-appearing.  HENT:     Head: Normocephalic and atraumatic.  Eyes:     Conjunctiva/sclera: Conjunctivae normal.  Cardiovascular:     Rate and Rhythm: Normal rate and regular rhythm.     Heart sounds: Normal heart sounds.  Pulmonary:     Effort: Pulmonary effort is normal. No respiratory distress.     Breath sounds: Normal breath sounds.  Abdominal:     General: Bowel sounds are normal. There is no distension.  Palpations: Abdomen is soft.     Tenderness: There is no abdominal tenderness.  Musculoskeletal:        General: Normal range of motion.     Cervical back: Normal range of motion.  Skin:    General: Skin is warm and dry.  Neurological:     Mental Status: She is alert and oriented to person, place, and time.  Psychiatric:        Mood and Affect: Mood normal.        Behavior: Behavior normal.        Thought Content: Thought content normal.     MAU Course  Procedures Results for orders placed or performed during the hospital encounter of 06/04/20 (from the past 24 hour(s))  Urinalysis, Routine w reflex microscopic Urine, Clean Catch     Status: Abnormal   Collection Time: 06/04/20  7:47 PM  Result Value Ref Range   Color, Urine YELLOW YELLOW   APPearance HAZY (A) CLEAR   Specific Gravity, Urine 1.019 1.005 - 1.030   pH 5.0 5.0 -  8.0   Glucose, UA NEGATIVE NEGATIVE mg/dL   Hgb urine dipstick NEGATIVE NEGATIVE   Bilirubin Urine NEGATIVE NEGATIVE   Ketones, ur NEGATIVE NEGATIVE mg/dL   Protein, ur NEGATIVE NEGATIVE mg/dL   Nitrite NEGATIVE NEGATIVE   Leukocytes,Ua LARGE (A) NEGATIVE   RBC / HPF 0-5 0 - 5 RBC/hpf   WBC, UA 0-5 0 - 5 WBC/hpf   Bacteria, UA RARE (A) NONE SEEN   Squamous Epithelial / LPF 6-10 0 - 5   Mucus PRESENT   Wet prep, genital     Status: Abnormal   Collection Time: 06/04/20 10:10 PM  Result Value Ref Range   Yeast Wet Prep HPF POC NONE SEEN NONE SEEN   Trich, Wet Prep NONE SEEN NONE SEEN   Clue Cells Wet Prep HPF POC PRESENT (A) NONE SEEN   WBC, Wet Prep HPF POC MANY (A) NONE SEEN   Sperm NONE SEEN     MDM Pelvic Exam; Wet Prep and GC/CT Labs: UA,  Ultrasound Pain Medication Assessment and Plan  19 year old, G2P0010  SIUP at 12.3weeks Abdominal Cramping Spotting  -MSE completed -GC/CT and Wet Prep ordered and to be collected by nurse. -Will send for [place] and await results.    Cherre Robins 06/04/2020, 10:17 PM   Reassessment (12:13 AM)  -Reviewed results with patient. -Discussed BV including, what it is, how to treat, and ways to prevent it. -Discussed how BV could contribute to symptoms including cramping and spotting -Reviewed treatment and Rx for metrogel sent to pharmacy on file.  -Exam performed and findings discussed.  -Reviewed vaginal bleeding vs spotting.  Reassured. -Offered and accepts pain medication for HA. -Tylenol given.  -Encouraged to call or return to MAU if symptoms worsen or with the onset of new symptoms. -Discharged to home in stable condition.  Cherre Robins MSN, CNM Advanced Practice Provider, [facility] [employer] [facility]

## 2020-06-04 NOTE — MAU Note (Signed)
PT SAYS SHE HAS CRAMPING - STARTED ON WED.  NO MEDS .  HAS SPOTTING WHEN SHE WIPES - STARTED - UNSURE.

## 2020-06-05 MED ORDER — ACETAMINOPHEN 500 MG PO TABS
1000.0000 mg | ORAL_TABLET | Freq: Once | ORAL | Status: AC
  Administered 2020-06-05: 1000 mg via ORAL
  Filled 2020-06-05: qty 2

## 2020-06-05 MED ORDER — ONDANSETRON 4 MG PO TBDP: 4.0000 mg | ORAL_TABLET | Freq: Three times a day (TID) | ORAL | 1 refills | Status: DC | PRN

## 2020-06-05 MED ORDER — METRONIDAZOLE 0.75 % VA GEL: 1.0000 | Freq: Every day | VAGINAL | 0 refills | Status: DC

## 2020-06-05 NOTE — Discharge Instructions (Signed)
Abdominal Pain During Pregnancy  Abdominal pain is common during pregnancy, and has many possible causes. Some causes are more serious than others, and sometimes the cause is not known. Abdominal pain can be a sign that labor is starting. It can also be caused by normal growth and stretching of muscles and ligaments during pregnancy. Always tell your health care provider if you have any abdominal pain. Follow these instructions at home:  Do not have sex or put anything in your vagina until your pain goes away completely.  Get plenty of rest until your pain improves.  Drink enough fluid to keep your urine pale yellow.  Take over-the-counter and prescription medicines only as told by your health care provider.  Keep all follow-up visits as told by your health care provider. This is important. Contact a health care provider if:  Your pain continues or gets worse after resting.  You have lower abdominal pain that: ? Comes and goes at regular intervals. ? Spreads to your back. ? Is similar to menstrual cramps.  You have pain or burning when you urinate. Get help right away if:  You have a fever or chills.  You have vaginal bleeding.  You are leaking fluid from your vagina.  You are passing tissue from your vagina.  You have vomiting or diarrhea that lasts for more than 24 hours.  Your baby is moving less than usual.  You feel very weak or faint.  You have shortness of breath.  You develop severe pain in your upper abdomen. Summary  Abdominal pain is common during pregnancy, and has many possible causes.  If you experience abdominal pain during pregnancy, tell your health care provider right away.  Follow your health care provider's home care instructions and keep all follow-up visits as directed. This information is not intended to replace advice given to you by your health care provider. Make sure you discuss any questions you have with your health care  provider. Document Revised: 10/07/2018 Document Reviewed: 09/21/2016 Elsevier Patient Education  2020 [employer]  Bacterial Vaginosis  Bacterial vaginosis is a vaginal infection that occurs when the normal balance of bacteria in the vagina is disrupted. It results from an overgrowth of certain bacteria. This is the most common vaginal infection among women ages 61-44. Because bacterial vaginosis increases your risk for STIs (sexually transmitted infections), getting treated can help reduce your risk for chlamydia, gonorrhea, herpes, and HIV (human immunodeficiency virus). Treatment is also important for preventing complications in pregnant women, because this condition can cause an early (premature) delivery. What are the causes? This condition is caused by an increase in harmful bacteria that are normally present in small amounts in the vagina. However, the reason that the condition develops is not fully understood. What increases the risk? The following factors may make you more likely to develop this condition:  Having a new sexual partner or multiple sexual partners.  Having unprotected sex.  Douching.  Having an intrauterine device (IUD).  Smoking.  Drug and alcohol abuse.  Taking certain antibiotic medicines.  Being pregnant. You cannot get bacterial vaginosis from toilet seats, bedding, swimming pools, or contact with objects around you. What are the signs or symptoms? Symptoms of this condition include:  Grey or white vaginal discharge. The discharge can also be watery or foamy.  A fish-like odor with discharge, especially after sexual intercourse or during menstruation.  Itching in and around the vagina.  Burning or pain with urination. Some women with bacterial vaginosis have  no signs or symptoms. How is this diagnosed? This condition is diagnosed based on:  Your medical history.  A physical exam of the vagina.  Testing a sample of vaginal fluid under a  microscope to look for a large amount of bad bacteria or abnormal cells. Your health care provider may use a cotton swab or a small wooden spatula to collect the sample. How is this treated? This condition is treated with antibiotics. These may be given as a pill, a vaginal cream, or a medicine that is put into the vagina (suppository). If the condition comes back after treatment, a second round of antibiotics may be needed. Follow these instructions at home: Medicines  Take over-the-counter and prescription medicines only as told by your health care provider.  Take or use your antibiotic as told by your health care provider. Do not stop taking or using the antibiotic even if you start to feel better. General instructions  If you have a female sexual partner, tell her that you have a vaginal infection. She should see her health care provider and be treated if she has symptoms. If you have a female sexual partner, he does not need treatment.  During treatment: ? Avoid sexual activity until you finish treatment. ? Do not douche. ? Avoid alcohol as directed by your health care provider. ? Avoid breastfeeding as directed by your health care provider.  Drink enough water and fluids to keep your urine clear or pale yellow.  Keep the area around your vagina and rectum clean. ? Wash the area daily with warm water. ? Wipe yourself from front to back after using the toilet.  Keep all follow-up visits as told by your health care provider. This is important. How is this prevented?  Do not douche.  Wash the outside of your vagina with warm water only.  Use protection when having sex. This includes latex condoms and dental dams.  Limit how many sexual partners you have. To help prevent bacterial vaginosis, it is best to have sex with just one partner (monogamous).  Make sure you and your sexual partner are tested for STIs.  Wear cotton or cotton-lined underwear.  Avoid wearing tight pants and  pantyhose, especially during summer.  Limit the amount of alcohol that you drink.  Do not use any products that contain nicotine or tobacco, such as cigarettes and e-cigarettes. If you need help quitting, ask your health care provider.  Do not use illegal drugs. Where to find more information  Centers for Disease Control and Prevention: AppraiserFraud.fi  American Sexual Health Association (ASHA): www.ashastd.org  [place] Department of Health and Financial controller, Office on Women's Health: DustingSprays.pl or SecuritiesCard.it Contact a health care provider if:  Your symptoms do not improve, even after treatment.  You have more discharge or pain when urinating.  You have a fever.  You have pain in your abdomen.  You have pain during sex.  You have vaginal bleeding between periods. Summary  Bacterial vaginosis is a vaginal infection that occurs when the normal balance of bacteria in the vagina is disrupted.  Because bacterial vaginosis increases your risk for STIs (sexually transmitted infections), getting treated can help reduce your risk for chlamydia, gonorrhea, herpes, and HIV (human immunodeficiency virus). Treatment is also important for preventing complications in pregnant women, because the condition can cause an early (premature) delivery.  This condition is treated with antibiotic medicines. These may be given as a pill, a vaginal cream, or a medicine that is put into the  vagina (suppository). This information is not intended to replace advice given to you by your health care provider. Make sure you discuss any questions you have with your health care provider. Document Revised: 06/01/2017 Document Reviewed: 03/04/2016 Elsevier Patient Education  2020 [employer].  Second Trimester of Pregnancy The second trimester is from week 14 through week 27 (months 4 through 6). The second trimester is often a time when you feel your  best. Your body has adjusted to being pregnant, and you begin to feel better physically. Usually, morning sickness has lessened or quit completely, you may have more energy, and you may have an increase in appetite. The second trimester is also a time when the fetus is growing rapidly. At the end of the sixth month, the fetus is about 9 inches long and weighs about 1 pounds. You will likely begin to feel the baby move (quickening) between 16 and 20 weeks of pregnancy. Body changes during your second trimester Your body continues to go through many changes during your second trimester. The changes vary from woman to woman.  Your weight will continue to increase. You will notice your lower abdomen bulging out.  You may begin to get stretch marks on your hips, abdomen, and breasts.  You may develop headaches that can be relieved by medicines. The medicines should be approved by your health care provider.  You may urinate more often because the fetus is pressing on your bladder.  You may develop or continue to have heartburn as a result of your pregnancy.  You may develop constipation because certain hormones are causing the muscles that push waste through your intestines to slow down.  You may develop hemorrhoids or swollen, bulging veins (varicose veins).  You may have back pain. This is caused by: ? Weight gain. ? Pregnancy hormones that are relaxing the joints in your pelvis. ? A shift in weight and the muscles that support your balance.  Your breasts will continue to grow and they will continue to become tender.  Your gums may bleed and may be sensitive to brushing and flossing.  Dark spots or blotches (chloasma, mask of pregnancy) may develop on your face. This will likely fade after the baby is born.  A dark line from your belly button to the pubic area (linea nigra) may appear. This will likely fade after the baby is born.  You may have changes in your hair. These can include  thickening of your hair, rapid growth, and changes in texture. Some women also have hair loss during or after pregnancy, or hair that feels dry or thin. Your hair will most likely return to normal after your baby is born. What to expect at prenatal visits During a routine prenatal visit:  You will be weighed to make sure you and the fetus are growing normally.  Your blood pressure will be taken.  Your abdomen will be measured to track your baby's growth.  The fetal heartbeat will be listened to.  Any test results from the previous visit will be discussed. Your health care provider may ask you:  How you are feeling.  If you are feeling the baby move.  If you have had any abnormal symptoms, such as leaking fluid, bleeding, severe headaches, or abdominal cramping.  If you are using any tobacco products, including cigarettes, chewing tobacco, and electronic cigarettes.  If you have any questions. Other tests that may be performed during your second trimester include:  Blood tests that check for: ? Low  iron levels (anemia). ? High blood sugar that affects pregnant women (gestational diabetes) between 2724 and 28 weeks. ? Rh antibodies. This is to check for a protein on red blood cells (Rh factor).  Urine tests to check for infections, diabetes, or protein in the urine.  An ultrasound to confirm the proper growth and development of the baby.  An amniocentesis to check for possible genetic problems.  Fetal screens for spina bifida and Down syndrome.  HIV (human immunodeficiency virus) testing. Routine prenatal testing includes screening for HIV, unless you choose not to have this test. Follow these instructions at home: Medicines  Follow your health care provider's instructions regarding medicine use. Specific medicines may be either safe or unsafe to take during pregnancy.  Take a prenatal vitamin that contains at least 600 micrograms (mcg) of folic acid.  If you develop  constipation, try taking a stool softener if your health care provider approves. Eating and drinking   Eat a balanced diet that includes fresh fruits and vegetables, whole grains, good sources of protein such as meat, eggs, or tofu, and low-fat dairy. Your health care provider will help you determine the amount of weight gain that is right for you.  Avoid raw meat and uncooked cheese. These carry germs that can cause birth defects in the baby.  If you have low calcium intake from food, talk to your health care provider about whether you should take a daily calcium supplement.  Limit foods that are high in fat and processed sugars, such as fried and sweet foods.  To prevent constipation: ? Drink enough fluid to keep your urine clear or pale yellow. ? Eat foods that are high in fiber, such as fresh fruits and vegetables, whole grains, and beans. Activity  Exercise only as directed by your health care provider. Most women can continue their usual exercise routine during pregnancy. Try to exercise for 30 minutes at least 5 days a week. Stop exercising if you experience uterine contractions.  Avoid heavy lifting, wear low heel shoes, and practice good posture.  A sexual relationship may be continued unless your health care provider directs you otherwise. Relieving pain and discomfort  Wear a good support bra to prevent discomfort from breast tenderness.  Take warm sitz baths to soothe any pain or discomfort caused by hemorrhoids. Use hemorrhoid cream if your health care provider approves.  Rest with your legs elevated if you have leg cramps or low back pain.  If you develop varicose veins, wear support hose. Elevate your feet for 15 minutes, 3-4 times a day. Limit salt in your diet. Prenatal Care  Write down your questions. Take them to your prenatal visits.  Keep all your prenatal visits as told by your health care provider. This is important. Safety  Wear your seat belt at all  times when driving.  Make a list of emergency phone numbers, including numbers for family, friends, the hospital, and police and fire departments. General instructions  Ask your health care provider for a referral to a local prenatal education class. Begin classes no later than the beginning of month 6 of your pregnancy.  Ask for help if you have counseling or nutritional needs during pregnancy. Your health care provider can offer advice or refer you to specialists for help with various needs.  Do not use hot tubs, steam rooms, or saunas.  Do not douche or use tampons or scented sanitary pads.  Do not cross your legs for long periods of time.  Avoid  cat litter boxes and soil used by cats. These carry germs that can cause birth defects in the baby and possibly loss of the fetus by miscarriage or stillbirth.  Avoid all smoking, herbs, alcohol, and unprescribed drugs. Chemicals in these products can affect the formation and growth of the baby.  Do not use any products that contain nicotine or tobacco, such as cigarettes and e-cigarettes. If you need help quitting, ask your health care provider.  Visit your dentist if you have not gone yet during your pregnancy. Use a soft toothbrush to brush your teeth and be gentle when you floss. Contact a health care provider if:  You have dizziness.  You have mild pelvic cramps, pelvic pressure, or nagging pain in the abdominal area.  You have persistent nausea, vomiting, or diarrhea.  You have a bad smelling vaginal discharge.  You have pain when you urinate. Get help right away if:  You have a fever.  You are leaking fluid from your vagina.  You have spotting or bleeding from your vagina.  You have severe abdominal cramping or pain.  You have rapid weight gain or weight loss.  You have shortness of breath with chest pain.  You notice sudden or extreme swelling of your face, hands, ankles, feet, or legs.  You have not felt your baby  move in over an hour.  You have severe headaches that do not go away when you take medicine.  You have vision changes. Summary  The second trimester is from week 14 through week 27 (months 4 through 6). It is also a time when the fetus is growing rapidly.  Your body goes through many changes during pregnancy. The changes vary from woman to woman.  Avoid all smoking, herbs, alcohol, and unprescribed drugs. These chemicals affect the formation and growth your baby.  Do not use any tobacco products, such as cigarettes, chewing tobacco, and e-cigarettes. If you need help quitting, ask your health care provider.  Contact your health care provider if you have any questions. Keep all prenatal visits as told by your health care provider. This is important. This information is not intended to replace advice given to you by your health care provider. Make sure you discuss any questions you have with your health care provider. Document Revised: 10/11/2018 Document Reviewed: 07/25/2016 Elsevier Patient Education  2020 [employer].

## 2020-06-07 LAB — GC/CHLAMYDIA PROBE AMP (~~LOC~~) NOT AT ARMC
Chlamydia: NEGATIVE
Comment: NEGATIVE
Comment: NORMAL
Neisseria Gonorrhea: NEGATIVE

## 2020-06-16 ENCOUNTER — Other Ambulatory Visit: Payer: Self-pay

## 2020-06-16 ENCOUNTER — Emergency Department (HOSPITAL_COMMUNITY)
Admission: EM | Admit: 2020-06-16 | Discharge: 2020-06-17 | Disposition: A | Discharge status: Home / Self Care | Payer: Medicaid Other | Attending: Emergency Medicine | Admitting: Emergency Medicine

## 2020-06-16 ENCOUNTER — Encounter (HOSPITAL_COMMUNITY): Payer: Self-pay | Admitting: *Deleted

## 2020-06-16 DIAGNOSIS — M542 Cervicalgia: Secondary | ICD-10-CM | POA: Insufficient documentation

## 2020-06-16 DIAGNOSIS — Z5321 Procedure and treatment not carried out due to patient leaving prior to being seen by health care provider: Secondary | ICD-10-CM | POA: Insufficient documentation

## 2020-06-16 DIAGNOSIS — M545 Low back pain, unspecified: Secondary | ICD-10-CM | POA: Diagnosis not present

## 2020-06-16 DIAGNOSIS — M25561 Pain in right knee: Secondary | ICD-10-CM | POA: Diagnosis not present

## 2020-06-16 DIAGNOSIS — M25562 Pain in left knee: Secondary | ICD-10-CM | POA: Diagnosis not present

## 2020-06-16 DIAGNOSIS — R110 Nausea: Secondary | ICD-10-CM | POA: Diagnosis not present

## 2020-06-16 DIAGNOSIS — Z3A14 14 weeks gestation of pregnancy: Secondary | ICD-10-CM | POA: Insufficient documentation

## 2020-06-16 DIAGNOSIS — R531 Weakness: Secondary | ICD-10-CM | POA: Insufficient documentation

## 2020-06-16 DIAGNOSIS — O9A211 Injury, poisoning and certain other consequences of external causes complicating pregnancy, first trimester: Secondary | ICD-10-CM | POA: Insufficient documentation

## 2020-06-16 LAB — URINALYSIS, ROUTINE W REFLEX MICROSCOPIC
Bilirubin Urine: NEGATIVE
Glucose, UA: NEGATIVE mg/dL
Hgb urine dipstick: NEGATIVE
Ketones, ur: 80 mg/dL — AB
Nitrite: NEGATIVE
Protein, ur: NEGATIVE mg/dL
Specific Gravity, Urine: 1.017 (ref 1.005–1.030)
pH: 5 (ref 5.0–8.0)

## 2020-06-16 LAB — CBC
HCT: 35.6 % — ABNORMAL LOW (ref 36.0–46.0)
Hemoglobin: 11.5 g/dL — ABNORMAL LOW (ref 12.0–15.0)
MCH: 26.1 pg (ref 26.0–34.0)
MCHC: 32.3 g/dL (ref 30.0–36.0)
MCV: 80.7 fL (ref 80.0–100.0)
Platelets: 245 10*3/uL (ref 150–400)
RBC: 4.41 MIL/uL (ref 3.87–5.11)
RDW: 14.8 % (ref 11.5–15.5)
WBC: 11.3 10*3/uL — ABNORMAL HIGH (ref 4.0–10.5)
nRBC: 0 % (ref 0.0–0.2)

## 2020-06-16 LAB — I-STAT BETA HCG BLOOD, ED (MC, WL, AP ONLY): I-stat hCG, quantitative: >2000 m[IU]/mL — ABNORMAL HIGH (ref <5)

## 2020-06-16 NOTE — ED Triage Notes (Signed)
Both knees painful
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
## 2020-06-16 NOTE — ED Triage Notes (Signed)
The pt is c/o nausea and weakness [facility]  She is [redacted] weeks pregnant  lmp sugust  edc June 4th

## 2020-06-17 LAB — COMPREHENSIVE METABOLIC PANEL
ALT: 9 U/L (ref 0–44)
AST: 12 U/L — ABNORMAL LOW (ref 15–41)
Albumin: 3.5 g/dL (ref 3.5–5.0)
Alkaline Phosphatase: 50 U/L (ref 38–126)
Anion gap: 10 (ref 5–15)
BUN: <5 mg/dL — ABNORMAL LOW (ref 6–20)
CO2: 21 mmol/L — ABNORMAL LOW (ref 22–32)
Calcium: 9.2 mg/dL (ref 8.9–10.3)
Chloride: 103 mmol/L (ref 98–111)
Creatinine, Ser: 0.57 mg/dL (ref 0.44–1.00)
GFR, Estimated: >60 mL/min (ref >60)
Glucose, Bld: 77 mg/dL (ref 70–99)
Potassium: 4 mmol/L (ref 3.5–5.1)
Sodium: 134 mmol/L — ABNORMAL LOW (ref 135–145)
Total Bilirubin: 1 mg/dL (ref 0.3–1.2)
Total Protein: 6.6 g/dL (ref 6.5–8.1)

## 2020-06-17 LAB — LIPASE, BLOOD: Lipase: 20 U/L (ref 11–51)

## 2020-06-17 NOTE — ED Notes (Signed)
Pt called for vitals x3 with no response.  

## 2020-06-23 ENCOUNTER — Encounter: Payer: Self-pay | Admitting: Family Medicine

## 2020-06-23 ENCOUNTER — Ambulatory Visit (INDEPENDENT_AMBULATORY_CARE_PROVIDER_SITE_OTHER): Payer: Medicaid Other | Admitting: Family Medicine

## 2020-06-23 ENCOUNTER — Other Ambulatory Visit (HOSPITAL_COMMUNITY)
Admission: RE | Admit: 2020-06-23 | Discharge: 2020-06-23 | Disposition: A | Discharge status: Home / Self Care | Payer: Medicaid Other | Source: Ambulatory Visit | Attending: Advanced Practice Midwife | Admitting: Advanced Practice Midwife

## 2020-06-23 ENCOUNTER — Other Ambulatory Visit: Payer: Self-pay

## 2020-06-23 DIAGNOSIS — Z3480 Encounter for supervision of other normal pregnancy, unspecified trimester: Secondary | ICD-10-CM | POA: Insufficient documentation

## 2020-06-23 DIAGNOSIS — Z9289 Personal history of other medical treatment: Secondary | ICD-10-CM | POA: Insufficient documentation

## 2020-06-23 MED ORDER — PRENATAL PLUS 27-1 MG PO TABS: 1.0000 | ORAL_TABLET | Freq: Every day | ORAL | 12 refills | Status: DC

## 2020-06-23 MED ORDER — BLOOD PRESSURE MONITORING DEVI: 1.0000 | 0 refills | Status: DC

## 2020-06-23 NOTE — Progress Notes (Signed)
Medicaid Home Form Completed-06/23/20

## 2020-06-23 NOTE — Patient Instructions (Addendum)
Safe Medications in Pregnancy   Acne:  Benzoyl Peroxide  Salicylic Acid   Backache/Headache:  Tylenol: 2 regular strength every 4 hours OR        2 Extra strength every 6 hours   Colds/Coughs/Allergies:  Benadryl (alcohol free) 25 mg every 6 hours as needed  Breath right strips  Claritin  Cepacol throat lozenges  Chloraseptic throat spray  Cold-Eeze- up to three times per day  Cough drops, alcohol free  Flonase (by prescription only)  Guaifenesin  Mucinex  Robitussin DM (plain only, alcohol free)  Saline nasal spray/drops  Sudafed (pseudoephedrine) & Actifed * use only after [redacted] weeks gestation and if you do not have high blood pressure  Tylenol  Vicks Vaporub  Zinc lozenges  Zyrtec   Constipation:  Colace  Ducolax suppositories  Fleet enema  Glycerin suppositories  Metamucil  Milk of magnesia  Miralax  Senokot  Smooth move tea   Diarrhea:  Kaopectate  Imodium A-D   *NO pepto Bismol   Hemorrhoids:  Anusol  Anusol HC  Preparation H  Tucks   Indigestion:  Tums  Maalox  Mylanta  Zantac  Pepcid   Insomnia:  Benadryl (alcohol free) 25mg every 6 hours as needed  Tylenol PM  Unisom, no Gelcaps   Leg Cramps:  Tums  MagGel   Nausea/Vomiting:  Bonine  Dramamine  Emetrol  Ginger extract  Sea bands  Meclizine  Nausea medication to take during pregnancy:  Unisom (doxylamine succinate 25 mg tablets) Take one tablet daily at bedtime. If symptoms are not adequately controlled, the dose can be increased to a maximum recommended dose of two tablets daily (1/2 tablet in the morning, 1/2 tablet mid-afternoon and one at bedtime).  Vitamin B6 100mg tablets. Take one tablet twice a day (up to 200 mg per day).   Skin Rashes:  Aveeno products  Benadryl cream or 25mg every 6 hours as needed  Calamine Lotion  1% cortisone cream   Yeast infection:  Gyne-lotrimin 7  Monistat 7    **If taking multiple medications, please check labels to avoid  duplicating the same active ingredients  **take medication as directed on the label  ** Do not exceed 4000 mg of tylenol in 24 hours  **Do not take medications that contain aspirin or ibuprofen         AREA PEDIATRIC/FAMILY PRACTICE PHYSICIANS  [place] ([address]) . [facility] o Chambliss, MD; Eniola, MD; Hale, MD; Hensel, MD; McDiarmid, MD; McIntyer, MD; Alawna Graybeal, MD; Walden, MD o [address] [place], [place] [address] o [phone] o Mon-Fri 8:30-12:30, 1:30-5:00 o Providers come to see babies at [facility] o Accepting Medicaid . [facility] o Limited providers who accept newborns: Koirala, MD; Morrow, MD; Wolters, MD o [address], , [place] [address] o [phone] o Mon-Fri 8:00-5:30 o Babies seen by providers at [facility] o Does NOT accept Medicaid o Please call early in hospitalization for appointment (limited availability)  . [facility] o Mulberry, MD o [address] , [place] [address] o [phone] o Mon, Tue, Thur, Fri 8:30-5:00, Wed 10:00-7:00 (closed 1-2pm) o Babies seen by [facility] providers o Accepting Medicaid . [facility] o Rubin, MD o [address], , [place] [address] o [phone] o Mon-Fri 8:30-5:00, Sat 8:30-12:00 o Provider comes to see babies at [facility] o Accepting Medicaid o Must have been referred from current patients or contacted office   prior to delivery . [facility] ([facility]) o Brown, MD; Chandler, MD; Ettefagh, MD; Grant, MD; Lester, MD; McCormick, MD; McQueen, MD; Prose, MD; Simha, MD; Stanley, MD; Stryffeler, NP; Tebben, NP o [address], [place], [place] [address] o [phone] o Mon, Tue, Thur, Fri 8:30-5:30, Wed 9:30-5:30, Sat 8:30-12:30 o Babies seen by [facility]  providers o Accepting Medicaid o Only accepting infants of first-time parents or siblings of current patients o Hospital discharge coordinator will make follow-up appointment . Jack Amos o [address], [place], [place]  [address] o [phone]   Fax - [phone] . [facility] o [address], [place], [place]  [address] o Phone - [phone]   Fax - [phone] . Shilpa Gosrani o [address], [place], [place]  [address] o [phone]  [place] ([address]) . [facility] o Bates, MD; [place], MD; Cooper, Cox, MD; MD; Davis, MD; Dovico, MD; Ettefaugh, MD; Little, MD; Lowe, MD; Keiffer, MD; Melvin, MD; Sumner, MD; Williams, MD o [address], [place], [place] [address] o [phone] o Mon-Fri 8:30-5:00 (extended evenings Mon-Thur as needed), Sat-Sun 10:00-1:00 o Providers come to see babies at [facility] o Accepting Medicaid for families of first-time babies and families with all children in the household age 3 and under. Must register with office prior to making appointment (M-F only). . [facility] o Henson, NP; Knapp, MD; Lalonde, MD; Tysinger, PA o [address] [place], [place] [address] o [phone] o Mon-Fri 8:00-5:00 o Babies seen by providers at [facility] o Does NOT accept Medicaid/Commercial Insurance Only . [facility] ([facility])  o Artis, MD; Barnes, MD; Bratton, MD; Coccaro, MD; Lockett Gardner, MD; Kramer, MD; Marshall, MD; Netherton, MD; Poleto, MD; Skinner, MD o [address] [place], [place] [address] o [phone] o Mon-Fri 8:30-5:30, Sat (Oct.-Mar.) 9:00-1:00 o Babies seen by providers at [facility] o Accepting Medicaid  [place] ([address]) . [facility] o Reid, MD; Warner, MD o [address], [place], [place] [address] o [phone] o Mon-Fri 8:30-5:00, Sat  8:30-12:00 o Providers come to see babies at [facility] o Does NOT accept Medicaid . [facility] o Becker, PA; Hagler, MD; Scifres, PA; Sun, MD; Swayne, MD o [address], [place], [place] [address] o [phone] o Mon-Fri 8:00-5:00 o Babies seen by providers at [facility] o Does NOT accept Medicaid o Only accepting babies of parents who are patients o Please call early in hospitalization for appointment (limited availability) . [facility] o Clark, MD; Frye, MD; Kelleher, MD; Mack, NP; Miller, MD; O'Keller, MD; Patterson, NP; Pudlo, MD; Puzio, MD; Thomas, MD; Tucker, MD; Twiselton, MD o [address], [place], [place] [address] o [phone] o Mon-Fri 8:00-5:00, Sat 9:00-12:00 o Providers come to see babies at [facility] o Does NOT accept Medicaid  [place] ([address]) . [facility] o Limited providers accepting new patients: Brake, NP; Wharton, PA o [address], [place], [place] [address] o [phone] o Mon-Fri 8:00-5:00 o Babies seen by providers at [facility] o Does NOT accept Medicaid o Only accepting babies of parents who are patients o Please call early in hospitalization for appointment (limited availability) . [facility] o Gay, MD; Quinlan, MD o [address] ,  [address]   o [phone] (press [phone] to schedule appointment) o Mon-Fri 8:00-5:00 o Providers come to see babies at [facility] o Does NOT accept Medicaid . [facility] o Mazer, MD o [address] [place], [place] [address] o [phone] o Mon-Fri 8:30-5:00 (lunch 12:30-1:00), extended hours by appointment only Wed 5:00-6:30 o Babies seen by [facility] providers o Accepting Medicaid . [facility] o Banks, MD; Jordan, MD; Koberlein, MD o [address], [place], [place] [address] o [phone] o Mon-Fri  8:00-5:00 o Babies seen by [facility] providers o Does NOT accept Medicaid . [facility] o Parker, MD; Hunter, MD; Wallace, DO o [address] [place], [place] [address] o [phone] o Mon-Fri 8:00-5:00 o Babies seen by [facility] providers o Does NOT accept Medicaid . [facility] o Brandon, PA; Brecken, PA; Christy, NP; Dees, MD; DeClaire, MD; DeWeese, MD; Hansen, NP; Mills, NP; Parrish, NP; Smoot, NP; Summer, MD; Vapne, MD o [address] [place], [place] [address] o [phone] o Mon-Fri 8:30-5:00, Sat 10:00-1:00 o Providers come to see babies at [facility] o Does NOT accept Medicaid o Free prenatal information session Tuesdays at 4:45pm . [facility] o Bouska, MD; Gordon, PA; Jeffery, PA; Weber, PA o [address] [place] [address] o [phone] o Mon-Fri 7:30-5:30 o Babies seen by [facility] providers . [facility] o [address], [place], [place]  [address] o [phone]   Fax - [phone]  [place] ([address] & [address]) . [facility] o [place]ese, [place] o [address] [place], [place] [address] o [phone] o Mon-Thur 8:00-6:00 o Providers come to see babies at [facility] o Accepting Medicaid . [facility] o Anderson, NP; Badger, MD; Beal, PA; Spencer, PA o [address] [place], [place] [address] o [phone] o Mon-Thur 7:30-7:30, Fri 7:30-4:30 o Babies seen by [facility] providers o Accepting Medicaid . [facility] o Agbuya, MD; Klett, NP; Romgoolam, MD o [address] [place] [address], [place], [place] [address] o [phone] o Mon-Fri 8:30-5:00, Sat 8:30-12:00 o Providers come to see babies at [facility] o Accepting Medicaid o Must have "Meet & Greet" appointment at office prior to delivery . [facility] ([facility] o McCord, MD; Wallace, MD; Wood, MD o [address] [place] [address], [place], [place] [address] o [phone] o Mon-Wed 8:00-6:00, Thur-Fri 8:00-5:00, Sat 9:00-12:00 o Providers come to see babies at [facility] o Does NOT accept Medicaid o Only accepting siblings of current patients . [facility]  o [address] [place] [address], [place], [place]  [address] o [phone]   Fax - [phone] . [facility] o [address], [place], [place]  [address] o [phone]   Fax - [phone]  [place] ([address]) . [facility] o Cirigliano, DO; Matthews, DO o [address] [place], [place] [address] o [phone] o Mon-Fri 7:00-5:00 o Babies seen by [facility] providers o Does NOT accept Medicaid . [facility] o Briscoe, MD; Howley, PA; Moreira, PA o [address], [place], [place] [address] o [phone] o Mon-Fri 8:00-5:00 o Babies seen by [facility] providers o Accepting Medicaid . [facility] o Boyd, MD; Church, PA; Jones, NP; Osborn, PA o [address], [place], [place] [address]   o [phone] o Mon-Fri 8:00-5:00 o Babies seen by providers at [facility] o Accepting Medicaid  [place] ([address]) . [facility] [place] o Wendling, DO o [address] [place], [place] [address] o [phone] o Mon-Fri 8:00-5:00 o Babies seen by [facility] providers o Does NOT accept Medicaid o Limited availability, please call early in hospitalization to schedule follow-up . [facility] o Calderon, PA; Cummings, MD; Dillard, MD; Martin, PA; Olson, MD; VanDeven, PA o [address], [place], [place] [address] o [phone] o Mon-Fri 8:30-5:00, Sat 9:00-12:00 o Babies seen by providers at  [facility] o Accepting Medicaid o Please register online then schedule online or call office o [email or web address] . [facility] ([facility] o Hunter, NP; Kumar, MD; Martin Rogers, PA o [address], [place], [place] [address] o [phone] o Mon-Fri 8:00-5:00 o Babies seen by providers at [facility] o Accepting Medicaid . [facility] ([facility]) o Strathmere, MD; Kristi Fleenor, NP; West, MD o [address], [place], [place] [address] o [phone] o Mon-Fri 8:00-5:30, Sat&Sun by appointment (phones open at 8:30) o Babies seen by [facility] providers o Accepting Medicaid o Must be a first-time baby or sibling of current patient . [facility] [place]  o [address], [place], [place]  [address] o [phone]   Fax - [phone]  [place] ([address]) . [facility] o Brown, PA; Cowen, PA; Rice, MD; Helton, PA; Spry, MD o [address] [place], [place] [address] o [phone] o Mon-Thur 8:00-7:00, Fri 8:00-5:00, Sat 8:00-12:00, Sun 9:00-12:00 o Babies seen by [facility] providers o Accepting Medicaid . [facility] o Coe-Goins, MD; Marshall, MD; Pierre-Louis, MD o [address], [place], [place] [address] o [phone] o Mon-Thur 8:00-5:00 o Babies seen by providers at [facility] o Accepting Medicaid . [facility] o Bratton, MD; Coe-Goins, MD; Hayes, MD; Lewis, MD; List, MD; Lott, MD; Marshall, MD; Moran, MD; O'Colbey Wirtanen, MD; Pierre-Louis, MD; Pitonzo, MD; Scholer, MD; Spangle, MD o [address] [place], [place] [address] o [phone] o Mon-Fri 8:00-5:30, Sat (Oct.-Mar.) 9:00-1:00 o Babies seen by providers at [facility] o Accepting Medicaid o Must fill out  new patient packet, available online at [email or web address] . [facility] ([facility]) o Friddle, NP; Harris, NP; Kelly, NP; Logan, MD; Melvin, PA; Poth, MD; Ramadoss, MD; Stanton, NP o [address], [place], [place] [address] o [phone] o Mon-Thur 8:00-5:30, Fri 8:00-5:00 o Babies seen by providers at [facility] o Accepting Medicaid  [place] ([address]) . [facility] o Dixon, PA; [address], MD; Pickard, MD; Tapia, PA o [address], [place], [place] [address] o [phone] o Mon-Fri 8:00-5:00 o Babies seen by providers at [facility] o Accepting Medicaid   [place] ([address]) . [facility] [place] o Masneri, DO; Meyers, MD; Nelson, PA o [address], [place],  [address] o [phone] o Mon-Fri 8:00-5:00 o Babies seen by providers at [facility] o Does NOT accept Medicaid o Limited appointment availability, please call early in hospitalization  .  [facility] [place] o Kunedd, DO;   Lorayne Marek, MD o [address], [place], [place] [address] o [phone] o Mon-Fri 8:00-5:00 o Babies seen by [facility] providers o Does NOT accept Medicaid . [facility] Lorrine Kin, MD; Ninetta Lights, MD; [place], [place]; [place], MD o [address], [place], [place] [address] o [phone] o Mon-Fri 8:00-5:00 o After hours clinic [facility] [place], [place] [address]) [phone] Mon-Fri 5:00-8:00, Sat 12:00-6:00, Sun 10:00-4:00 o Babies seen by [facility] providers o Accepting Medicaid . [facility] o [address], [place], [place]  [address] o [phone]   Fax - [phone]  [place] [phone]) . Nature conservation officer at [facility], MD o [address] [place], [place], [place] [address] o [phone] o Mon-Fri 8:00-5:00 o Babies seen by [facility] providers o Does NOT accept Medicaid . [facility] [place] [facility] [place]) Tomi Likens, MD o [address] [place] [address], [place], [place] [address] o [phone] o Mon-Thur 8:00-7:00, Fri 8:00-5:00, Sat 8:00-12:00 o Babies seen by providers at [facility] o Accepting Medicaid - but does not have vaccinations in office (must be received elsewhere) o Limited availability, please call early in hospitalization  [place] ([address]) . [facility]  o Wyvonne Lenz, MD o [address] [place] [address] o [phone]  Fax [phone]

## 2020-06-23 NOTE — Progress Notes (Signed)
History:   Heidi Martin is a 19 y.o. G2P0010 at [redacted]w[redacted]d by early ultrasound being seen today for her first obstetrical visit.  Her obstetrical history is significant for therapeutic abortion 11/2019, otherwise unremarkable. Patient does intend to breast feed. Pregnancy history fully reviewed. Pregnancy unplanned. Excited but nervous. FOB involved.  Patient reports nausea, improved from prior. Not taking meds. Previously presented to [facility]U for this issue. States it is better when she doesn't take medication. Still able to tolerate PO. NBNB emesis. Only a few episodes. No weight loss that she has noted. Otherwise without complaints.      HISTORY: OB History  Gravida Para Term Preterm AB Living  2 0 0 0 1 0  SAB IAB Ectopic Multiple Live Births  1 0 0 0 0    # Outcome Date GA Lbr Len/2nd Weight Sex Delivery Anes PTL Lv  2 Current           1 SAB             Last pap smear was done n/a-does not meet age criteria.   Past Medical History:  Diagnosis Date  . COVID-19   . Irritable bowel syndrome (IBS)   . Sickle cell trait [facility])    Past Surgical History:  Procedure Laterality Date  . INDUCED ABORTION    . NO PAST SURGERIES     History reviewed. No pertinent family history. Social History   Tobacco Use  . Smoking status: Never Smoker  . Smokeless tobacco: Never Used  Vaping Use  . Vaping Use: Never used  Substance Use Topics  . Alcohol use: No  . Drug use: No   Allergies  Allergen Reactions  . Milk [Lac Bovis] Nausea And Vomiting    Stomach cramping   Current Outpatient Medications on File Prior to Visit  Medication Sig Dispense Refill  . docusate sodium (COLACE) 100 MG capsule Take 1 capsule (100 mg total) by mouth every 12 (twelve) hours. 20 capsule 0  . ondansetron (ZOFRAN ODT) 4 MG disintegrating tablet Take 1-2 tablets (4-8 mg total) by mouth every 8 (eight) hours as needed for nausea or vomiting. 20 tablet 1  . metroNIDAZOLE (METROGEL VAGINAL) 0.75 % vaginal gel  Place 1 Applicatorful vaginally at bedtime. Insert one applicator, at bedtime, for 5 nights. 70 g 0  . [DISCONTINUED] dicyclomine (BENTYL) 20 MG tablet Take 1 tablet (20 mg total) by mouth 2 (two) times daily. (Patient not taking: Reported on 02/14/2020) 20 tablet 0  . [DISCONTINUED] metoCLOPramide (REGLAN) 10 MG tablet Take 1 tablet (10 mg total) by mouth 3 (three) times daily between meals as needed for nausea. (Patient not taking: Reported on 02/14/2020) 30 tablet 0  . [DISCONTINUED] promethazine (PHENERGAN) 12.5 MG tablet Take 1-2 tablets (12.5-25 mg total) by mouth at bedtime. (Patient not taking: Reported on 02/14/2020) 30 tablet 0   No current facility-administered medications on file prior to visit.    Review of Systems Pertinent items noted in HPI and remainder of comprehensive ROS otherwise negative. Physical Exam:   Vitals:   06/23/20 1444  BP: 116/70  Pulse: 73  Weight: 152 lb 12.8 oz (69.3 kg)   Fetal Heart Rate (bpm): 153  Uterus:                             System: General: well-developed, well-nourished female in no acute distress   Breasts:  normal appearance, no masses or tenderness bilaterally  Skin: normal coloration and turgor, no rashes   Neurologic: oriented, normal, negative, normal mood   Extremities: normal strength, tone, and muscle mass, ROM of all joints is normal   HEENT PERRLA, extraocular movement intact and sclera clear, anicteric   Mouth/Teeth mucous membranes moist, pharynx normal without lesions and dental hygiene good   Neck supple and no masses   Cardiovascular: regular rate and rhythm   Respiratory:  no respiratory distress, normal breath sounds   Abdomen: soft, non-tender; bowel sounds normal; no masses,  no organomegaly    Assessment:    Pregnancy: G2P0010 Patient Active Problem List   Diagnosis Date Noted  . Supervision of other normal pregnancy, antepartum 06/23/2020  . History of therapeutic abortion 06/23/2020     Plan:     Heidi Martin was seen today for initial prenatal visit.  Diagnoses and all orders for this visit:  Supervision of other normal pregnancy, antepartum -     CBC/D/Plt+RPR+Rh+ABO+Rub Ab... -     CHL AMB BABYSCRIPTS SCHEDULE OPTIMIZATION -     Culture, OB Urine -     Genetic Screening -     [place] MFM OB COMP + 14 WK; Future -     Hemoglobin A1c -     Cervicovaginal ancillary only( [place]) -     Blood Pressure Monitoring DEVI; 1 each by Does not apply route once a week. -     AFP, Serum, Open Spina Bifida  History of therapeutic abortion  Other orders -     prenatal vitamin w/FE, FA (PRENATAL 1 + 1) 27-1 MG TABS tablet; Take 1 tablet by mouth daily at 12 noon.    Initial labs drawn. Continue prenatal vitamins. Problem list reviewed and updated. Genetic Screening discussed, First trimester screen, Quad screen and NIPS: ordered. Ultrasound discussed; fetal anatomic survey: ordered. Anticipatory guidance about prenatal visits given including labs, ultrasounds, and testing. Discussed usage of Babyscripts and virtual visits as additional source of managing and completing prenatal visits in midst of coronavirus and pandemic.   Encouraged to complete MyChart Registration for her ability to review results, send requests, and have questions addressed.  The nature of [place] [facility]/Faculty Practice with multiple MDs and Advanced Practice Providers was explained to patient; also emphasized that residents, students are part of our team. Routine obstetric precautions reviewed. Encouraged to seek out care at office or emergency room [facility] MAU preferred) for urgent and/or emergent concerns. Return in about 4 weeks (around 07/21/2020) for LROB, in person.     Alric Seton, MD OB Fellow, Faculty [facility] 06/23/2020 3:04 PM

## 2020-06-24 ENCOUNTER — Encounter (HOSPITAL_COMMUNITY): Payer: Self-pay | Admitting: Emergency Medicine

## 2020-06-24 ENCOUNTER — Emergency Department (HOSPITAL_COMMUNITY)
Admission: EM | Admit: 2020-06-24 | Discharge: 2020-06-25 | Disposition: A | Discharge status: Home / Self Care | Payer: Medicaid Other | Attending: Emergency Medicine | Admitting: Emergency Medicine

## 2020-06-24 ENCOUNTER — Other Ambulatory Visit: Payer: Self-pay

## 2020-06-24 DIAGNOSIS — O98512 Other viral diseases complicating pregnancy, second trimester: Secondary | ICD-10-CM | POA: Diagnosis not present

## 2020-06-24 DIAGNOSIS — Z8616 Personal history of COVID-19: Secondary | ICD-10-CM | POA: Diagnosis not present

## 2020-06-24 DIAGNOSIS — Z3A15 15 weeks gestation of pregnancy: Secondary | ICD-10-CM | POA: Diagnosis not present

## 2020-06-24 DIAGNOSIS — U071 COVID-19: Secondary | ICD-10-CM | POA: Insufficient documentation

## 2020-06-24 LAB — CBC/D/PLT+RPR+RH+ABO+RUB AB...
Antibody Screen: NEGATIVE
Basophils Absolute: 0 10*3/uL (ref 0.0–0.2)
Basos: 0 %
EOS (ABSOLUTE): 0 10*3/uL (ref 0.0–0.4)
Eos: 0 %
HCV Ab: 0.1 s/co ratio (ref 0.0–0.9)
HIV Screen 4th Generation wRfx: NONREACTIVE
Hematocrit: 36 % (ref 34.0–46.6)
Hemoglobin: 11.8 g/dL (ref 11.1–15.9)
Hepatitis B Surface Ag: NEGATIVE
Immature Grans (Abs): 0.1 10*3/uL (ref 0.0–0.1)
Immature Granulocytes: 1 %
Lymphocytes Absolute: 1.1 10*3/uL (ref 0.7–3.1)
Lymphs: 10 %
MCH: 26.5 pg — ABNORMAL LOW (ref 26.6–33.0)
MCHC: 32.8 g/dL (ref 31.5–35.7)
MCV: 81 fL (ref 79–97)
Monocytes Absolute: 0.6 10*3/uL (ref 0.1–0.9)
Monocytes: 6 %
Neutrophils Absolute: 8.9 10*3/uL — ABNORMAL HIGH (ref 1.4–7.0)
Neutrophils: 83 %
Platelets: 264 10*3/uL (ref 150–450)
RBC: 4.45 x10E6/uL (ref 3.77–5.28)
RDW: 14.6 % (ref 11.7–15.4)
RPR Ser Ql: NONREACTIVE
Rh Factor: POSITIVE
Rubella Antibodies, IGG: 5.09 index (ref >0.99)
WBC: 10.7 10*3/uL (ref 3.4–10.8)

## 2020-06-24 LAB — CBC
HCT: 32.8 % — ABNORMAL LOW (ref 36.0–46.0)
Hemoglobin: 11 g/dL — ABNORMAL LOW (ref 12.0–15.0)
MCH: 26.7 pg (ref 26.0–34.0)
MCHC: 33.5 g/dL (ref 30.0–36.0)
MCV: 79.6 fL — ABNORMAL LOW (ref 80.0–100.0)
Platelets: 247 10*3/uL (ref 150–400)
RBC: 4.12 MIL/uL (ref 3.87–5.11)
RDW: 14.9 % (ref 11.5–15.5)
WBC: 9.2 10*3/uL (ref 4.0–10.5)
nRBC: 0 % (ref 0.0–0.2)

## 2020-06-24 LAB — COMPREHENSIVE METABOLIC PANEL
ALT: 10 U/L (ref 0–44)
AST: 13 U/L — ABNORMAL LOW (ref 15–41)
Albumin: 3.4 g/dL — ABNORMAL LOW (ref 3.5–5.0)
Alkaline Phosphatase: 50 U/L (ref 38–126)
Anion gap: 8 (ref 5–15)
BUN: <5 mg/dL — ABNORMAL LOW (ref 6–20)
CO2: 24 mmol/L (ref 22–32)
Calcium: 9 mg/dL (ref 8.9–10.3)
Chloride: 105 mmol/L (ref 98–111)
Creatinine, Ser: 0.63 mg/dL (ref 0.44–1.00)
GFR, Estimated: >60 mL/min (ref >60)
Glucose, Bld: 63 mg/dL — ABNORMAL LOW (ref 70–99)
Potassium: 3.8 mmol/L (ref 3.5–5.1)
Sodium: 137 mmol/L (ref 135–145)
Total Bilirubin: 0.4 mg/dL (ref 0.3–1.2)
Total Protein: 6.5 g/dL (ref 6.5–8.1)

## 2020-06-24 LAB — URINALYSIS, ROUTINE W REFLEX MICROSCOPIC
Bilirubin Urine: NEGATIVE
Glucose, UA: NEGATIVE mg/dL
Hgb urine dipstick: NEGATIVE
Ketones, ur: NEGATIVE mg/dL
Nitrite: NEGATIVE
Protein, ur: NEGATIVE mg/dL
Specific Gravity, Urine: 1.003 — ABNORMAL LOW (ref 1.005–1.030)
pH: 6 (ref 5.0–8.0)

## 2020-06-24 LAB — HCV INTERPRETATION

## 2020-06-24 LAB — CERVICOVAGINAL ANCILLARY ONLY
Chlamydia: NEGATIVE
Comment: NEGATIVE
Comment: NORMAL
Neisseria Gonorrhea: NEGATIVE

## 2020-06-24 LAB — LIPASE, BLOOD: Lipase: 23 U/L (ref 11–51)

## 2020-06-24 LAB — HEMOGLOBIN A1C
Est. average glucose Bld gHb Est-mCnc: 111 mg/dL
Hgb A1c MFr Bld: 5.5 % (ref 4.8–5.6)

## 2020-06-24 LAB — I-STAT BETA HCG BLOOD, ED (MC, WL, AP ONLY): I-stat hCG, quantitative: >2000 m[IU]/mL — ABNORMAL HIGH (ref <5)

## 2020-06-24 NOTE — ED Triage Notes (Signed)
Pt presents to ED POV. Pt c/o emesis, body aches, cough. Denies resp issues. Pt is vaccinated for covid. 15w pregnant

## 2020-06-25 LAB — AFP, SERUM, OPEN SPINA BIFIDA
AFP MoM: 1.05
AFP Value: 34.3 ng/mL
Gest. Age on Collection Date: 15.1 weeks
Maternal Age At EDD: 19.7 yr
OSBR Risk 1 IN: 10000
Test Results:: NEGATIVE
Weight: 153 [lb_av]

## 2020-06-25 LAB — RESP PANEL BY RT-PCR (FLU A&B, COVID) ARPGX2
Influenza A by PCR: NEGATIVE
Influenza B by PCR: NEGATIVE
SARS Coronavirus 2 by RT PCR: POSITIVE — AB

## 2020-06-25 MED ORDER — EPINEPHRINE 0.3 MG/0.3ML IJ SOAJ
0.3000 mg | Freq: Once | INTRAMUSCULAR | Status: DC | PRN
  Filled 2020-06-25: qty 0.3

## 2020-06-25 MED ORDER — ONDANSETRON 4 MG PO TBDP
4.0000 mg | ORAL_TABLET | Freq: Once | ORAL | Status: AC
  Administered 2020-06-25: 01:00:00 4 mg via ORAL
  Filled 2020-06-25: qty 1

## 2020-06-25 MED ORDER — SODIUM CHLORIDE 0.9 % IV SOLN
Freq: Once | INTRAVENOUS | Status: AC
  Filled 2020-06-25: qty 20

## 2020-06-25 MED ORDER — SODIUM CHLORIDE 0.9 % IV SOLN: 1200.0000 mg | Freq: Once | INTRAVENOUS | Status: DC

## 2020-06-25 MED ORDER — SODIUM CHLORIDE 0.9 % IV SOLN: INTRAVENOUS | Status: DC | PRN

## 2020-06-25 MED ORDER — DIPHENHYDRAMINE HCL 50 MG/ML IJ SOLN
50.0000 mg | Freq: Once | INTRAMUSCULAR | Status: AC | PRN
  Administered 2020-06-25: 04:00:00 50 mg via INTRAVENOUS
  Filled 2020-06-25: qty 1

## 2020-06-25 MED ORDER — FAMOTIDINE IN NACL 20-0.9 MG/50ML-% IV SOLN: 20.0000 mg | Freq: Once | INTRAVENOUS | Status: DC | PRN

## 2020-06-25 MED ORDER — ALBUTEROL SULFATE HFA 108 (90 BASE) MCG/ACT IN AERS: 2.0000 | INHALATION_SPRAY | Freq: Once | RESPIRATORY_TRACT | Status: DC | PRN

## 2020-06-25 MED ORDER — METHYLPREDNISOLONE SODIUM SUCC 125 MG IJ SOLR
125.0000 mg | Freq: Once | INTRAMUSCULAR | Status: AC | PRN
  Administered 2020-06-25: 04:00:00 125 mg via INTRAVENOUS
  Filled 2020-06-25: qty 2

## 2020-06-25 NOTE — ED Notes (Signed)
Date and time results received: 06/25/20 0249 (use smartphrase ".now" to insert current time)  Test: COVID Critical Value: Positive  Name of Provider Notified: Sharilyn Sites  Orders Received? Or Actions Taken?: Continue to monitor patient.

## 2020-06-25 NOTE — ED Provider Notes (Signed)
[facility] Provider Note   CSN: [id number] Arrival date & time: 06/24/20  1936     History Chief Complaint  Patient presents with  . Emesis    Heidi Martin is a 19 y.o. female.  The history is provided by the patient and medical records.  Emesis Associated symptoms: cough and myalgias     19 y.o. F with hx of IBS, sickle cell trait, presenting to the ED for cough, body aches, nausea, and vomiting x4 days.  She is approx [redacted] wks gestation, no complications thus far in pregnancy.  States no vomiting up until today.  She has not been eating/drinking today due to nausea.  She states cough is dry, some sneezing with this.  No mucous production, runny nose, sore throat, etc.  States little sister sick recently with sore throat/cold.  No known covid exposures.  She is fully vaccinated against covid-19 and actually had positive test in September 2021.  Past Medical History:  Diagnosis Date  . COVID-19   . Irritable bowel syndrome (IBS)   . Sickle cell trait [facility](HCC)     Patient Active Problem List   Diagnosis Date Noted  . Supervision of other normal pregnancy, antepartum 06/23/2020  . History of therapeutic abortion 06/23/2020    Past Surgical History:  Procedure Laterality Date  . INDUCED ABORTION    . NO PAST SURGERIES       OB History    Gravida  2   Para      Term      Preterm      AB  1   Living        SAB  1   IAB      Ectopic      Multiple      Live Births              History reviewed. No pertinent family history.  Social History   Tobacco Use  . Smoking status: Never Smoker  . Smokeless tobacco: Never Used  Vaping Use  . Vaping Use: Never used  Substance Use Topics  . Alcohol use: No  . Drug use: No    Home Medications Prior to Admission medications   Medication Sig Start Date End Date Taking? Authorizing Provider  Blood Pressure Monitoring DEVI 1 each by Does not apply route once a week.  06/23/20   Alric SetonFirestone, Alicia C, MD  docusate sodium (COLACE) 100 MG capsule Take 1 capsule (100 mg total) by mouth every 12 (twelve) hours. 03/30/20   Renne CriglerGeiple, Joshua, PA-C  ondansetron (ZOFRAN ODT) 4 MG disintegrating tablet Take 1-2 tablets (4-8 mg total) by mouth every 8 (eight) hours as needed for nausea or vomiting. 06/05/20   Gerrit HeckEmly, Jessica, CNM  prenatal vitamin w/FE, FA (PRENATAL 1 + 1) 27-1 MG TABS tablet Take 1 tablet by mouth daily at 12 noon. 06/23/20   Alric SetonFirestone, Alicia C, MD  dicyclomine (BENTYL) 20 MG tablet Take 1 tablet (20 mg total) by mouth 2 (two) times daily. Patient not taking: Reported on 02/14/2020 11/14/19 02/14/20  Renne CriglerGeiple, Joshua, PA-C  metoCLOPramide (REGLAN) 10 MG tablet Take 1 tablet (10 mg total) by mouth 3 (three) times daily between meals as needed for nausea. Patient not taking: Reported on 02/14/2020 10/04/19 02/14/20  Leftwich-Kirby, Wilmer FloorLisa A, CNM  promethazine (PHENERGAN) 12.5 MG tablet Take 1-2 tablets (12.5-25 mg total) by mouth at bedtime. Patient not taking: Reported on 02/14/2020 10/04/19 02/14/20  Hurshel PartyLeftwich-Kirby, Marsia Cino A, CNM  Allergies    Milk [lac bovis]  Review of Systems   Review of Systems  Respiratory: Positive for cough.   Gastrointestinal: Positive for nausea and vomiting.  Musculoskeletal: Positive for myalgias.  All other systems reviewed and are negative.   Physical Exam Updated Vital Signs BP 121/82 (BP Location: Left Arm)   Pulse 68   Temp (!) 97.3 F (36.3 C) (Oral)   Resp 18   LMP 03/09/2020 (Exact Date) Comment: neg preg test 03/30/20  SpO2 100%   Physical Exam Vitals and nursing note reviewed.  Constitutional:      Appearance: She is well-developed and well-nourished.     Comments: Appears well  HENT:     Head: Normocephalic and atraumatic.     Mouth/Throat:     Mouth: Oropharynx is clear and moist.  Eyes:     Extraocular Movements: EOM normal.     Conjunctiva/sclera: Conjunctivae normal.     Pupils: Pupils are equal, round, and  reactive to light.  Cardiovascular:     Rate and Rhythm: Normal rate and regular rhythm.     Heart sounds: Normal heart sounds.  Pulmonary:     Effort: Pulmonary effort is normal. No respiratory distress.     Breath sounds: Normal breath sounds. No rhonchi.     Comments: Dry cough noted but lungs clear, NAD Abdominal:     General: Bowel sounds are normal.     Palpations: Abdomen is soft.     Tenderness: There is no abdominal tenderness. There is no rebound.  Musculoskeletal:        General: Normal range of motion.     Cervical back: Normal range of motion.  Skin:    General: Skin is warm and dry.  Neurological:     Mental Status: She is alert and oriented to person, place, and time.  Psychiatric:        Mood and Affect: Mood and affect normal.     ED Results / Procedures / Treatments   Labs (all labs ordered are listed, but only abnormal results are displayed) Labs Reviewed  RESP PANEL BY RT-PCR (FLU A&B, COVID) ARPGX2 - Abnormal; Notable for the following components:      Result Value   SARS Coronavirus 2 by RT PCR POSITIVE (*)    All other components within normal limits  COMPREHENSIVE METABOLIC PANEL - Abnormal; Notable for the following components:   Glucose, Bld 63 (*)    BUN <5 (*)    Albumin 3.4 (*)    AST 13 (*)    All other components within normal limits  CBC - Abnormal; Notable for the following components:   Hemoglobin 11.0 (*)    HCT 32.8 (*)    MCV 79.6 (*)    All other components within normal limits  URINALYSIS, ROUTINE W REFLEX MICROSCOPIC - Abnormal; Notable for the following components:   Color, Urine STRAW (*)    Specific Gravity, Urine 1.003 (*)    Leukocytes,Ua SMALL (*)    Bacteria, UA RARE (*)    All other components within normal limits  I-STAT BETA HCG BLOOD, ED ([facility], [facility], [facility] ONLY) - Abnormal; Notable for the following components:   I-stat hCG, quantitative >2,000.0 (*)    All other components within normal limits  LIPASE, BLOOD     EKG None  Radiology No results found.  Procedures Procedures (including critical care time)  CRITICAL CARE Performed by: Garlon Hatchet   Total critical care time: 35 minutes  Critical  care time was exclusive of separately billable procedures and treating other patients.  Critical care was necessary to treat or prevent imminent or life-threatening deterioration.  Critical care was time spent personally by me on the following activities: development of treatment plan with patient and/or surrogate as well as nursing, discussions with consultants, evaluation of patient's response to treatment, examination of patient, obtaining history from patient or surrogate, ordering and performing treatments and interventions, ordering and review of laboratory studies, ordering and review of radiographic studies, pulse oximetry and re-evaluation of patient's condition.   Medications Ordered in ED Medications  0.9 %  sodium chloride infusion (has no administration in time range)  famotidine (PEPCID) IVPB 20 mg premix (has no administration in time range)  albuterol (VENTOLIN HFA) 108 (90 Base) MCG/ACT inhaler 2 puff (has no administration in time range)  EPINEPHrine (EPI-PEN) injection 0.3 mg (has no administration in time range)  ondansetron (ZOFRAN-ODT) disintegrating tablet 4 mg (4 mg Oral Given 06/25/20 0117)  diphenhydrAMINE (BENADRYL) injection 50 mg (50 mg Intravenous Given 06/25/20 0355)  methylPREDNISolone sodium succinate (SOLU-MEDROL) 125 mg/2 mL injection 125 mg (125 mg Intravenous Given 06/25/20 0355)  bamlanivimab 700 mg, etesevimab 1,400 mg in sodium chloride 0.9 % 160 mL IVPB ( Intravenous Stopped 06/25/20 0355)    ED Course  I have reviewed the triage vital signs and the nursing notes.  Pertinent labs & imaging results that were available during my care of the patient were reviewed by me and considered in my medical decision making (see chart for details).    MDM  Rules/Calculators/A&P    19 y.o. F here with cough, nausea, vomiting, and feel unwell for about 2 days.  States little sister with sore throat.  She is afebrile, non-toxic.  VSS.  lungs clear, no distress.  She is currently [redacted]wks pregnant, no issues thus far in pregnancy.  Vaccinated for covid.  Labs obtained from triage are reassuring, no electrolyte derangement noted.  covid screen sent given sick contacts.  Given 1 time dose of zofran for nausea.  Will PO trial.  2:55 AM covid test is positive.  She did test positive 03/10/20 but > 3 months since then and now is symptomatic again.  Suspect this represents repeat infection, she has had sick contacts.  Discussed MAB infusion, she would like to move forward with this.  Discussed risk/benefits.  Given upcoming holiday, I am concerned she will be out of window by the time she is able to get OP appt and with her high risk state during pregnancy, will administer here in ED.  Nausea is better now, tolerating PO.   3:54 AM Patient having allergic reaction to infusion, only received approx 18cc of infusion-- rash on face, lips are somewhat swollen, coughing a bit but airway widely patent, still talking on facetime with mom.  Given benadryl, solu-medrol.  Will monitor closely.  5:28 AM Patient feeling better after allergic reaction meds.  No further lip swelling, rash has dissipated.  Airway remains patent, no stridor, tolerating PO at present.  Feel she is stable for discharge.  As she did not tolerate MAB here, would not recommend any further attempts in OP setting.  Can continue PRN benadryl at home if needed.  Discussed quarantine precautions.  Close OP follow-up with PCP.  Return here for any new/acute changes.  Final Clinical Impression(s) / ED Diagnoses Final diagnoses:  COVID-19    Rx / DC Orders ED Discharge Orders    None  Garlon Hatchet, PA-C 06/25/20 0534    Ward, Layla Maw, DO 06/25/20 [phone]

## 2020-06-25 NOTE — ED Notes (Signed)
Pt given beverage per provider.

## 2020-06-25 NOTE — Discharge Instructions (Signed)
Your covid test was positive. You did have a reaction to the MAB infusion we attempted, would not recommend that you attempt this as an outpatient. Can continue benadryl as needed if any further itching/rash. Quarantine for the next 10 days.  Rest, stay hydrated. Follow-up with your primary care doctor. Return here for any new/acute changes.

## 2020-06-25 NOTE — ED Notes (Signed)
Pt with reaction to medication; provider to bedside; meds pulled and given IV; pt placed on monitor; pt to be closely observed per provider.

## 2020-06-27 ENCOUNTER — Encounter: Payer: Self-pay | Admitting: Family Medicine

## 2020-06-27 DIAGNOSIS — B951 Streptococcus, group B, as the cause of diseases classified elsewhere: Secondary | ICD-10-CM | POA: Insufficient documentation

## 2020-06-27 LAB — URINE CULTURE, OB REFLEX

## 2020-06-27 LAB — CULTURE, OB URINE

## 2020-06-28 ENCOUNTER — Other Ambulatory Visit: Payer: Self-pay | Admitting: Family Medicine

## 2020-06-28 DIAGNOSIS — R8271 Bacteriuria: Secondary | ICD-10-CM

## 2020-06-28 MED ORDER — AMOXICILLIN 500 MG PO TABS: 500.0000 mg | ORAL_TABLET | Freq: Two times a day (BID) | ORAL | 0 refills | Status: DC

## 2020-06-28 NOTE — Progress Notes (Signed)
GBS positive urine 12/22, rx for amoxicillin sent to [facility] pharmacy. Patient notified via myChart. Will need treatment in labor.  Alric Seton, MD OB Fellow, Faculty [facility], Center for [facility] 06/28/2020 8:10 PM

## 2020-06-29 ENCOUNTER — Telehealth: Payer: Self-pay

## 2020-06-29 NOTE — Telephone Encounter (Signed)
Per My Chart patient saw My Chart message last night.   Called patient and LM for her to call the office if she has any questions or concerns.

## 2020-06-29 NOTE — Telephone Encounter (Addendum)
-----   Message from Alric Seton, MD sent at 06/28/2020  8:11 PM EST ----- [name]- Your urine is positive for group b strep, we will treat you with amoxicillin. I have sent prescription to your [facility], please complete course. You will also need treatment with penicillin in labor to protect baby. We can discuss more at your upcoming appt.  Alric Seton, MD OB Fellow, Faculty Practice [facility], Center for [facility] 06/28/2020 8:11 PM  Left message on patient's voicemail that I am calling to verify if she received the message via MyChart from the provider and results.  Please give the office a return call back.   Addison Naegeli, RN

## 2020-07-03 NOTE — L&D Delivery Note (Signed)
Delivery Note Heidi Martin is a 20 y.o. G2P0010 at [redacted]w[redacted]d admitted for IOL for IUGR.   GBS Status: POSITIVE/-- (04/10 1447) Maximum Maternal Temperature: 98.4  Labor course: Initial SVE: 5/80/-1. Augmentation with: AROM and Pitocin. She then progressed to complete.  ROM: 3h 63m with clear fluid  Birth: At [phone] a viable female was delivered via spontaneous vaginal delivery (Presentation: cephalic; ROA). Nuchal cord present: No.  Shoulders and body delivered in usual fashion. Infant placed directly on mom's abdomen for bonding/skin-to-skin, baby dried and stimulated. Cord clamped x 2 after 1 minute and cut by patient's mother. Cord blood collected. The placenta separated spontaneously and delivered via gentle cord traction. Pitocin infused rapidly IV per protocol.  Fundus firm with massage.  Placenta inspected and appears to be intact with a 3 VC.  Placenta/Cord with the following complications: none.  Cord pH: n/a Sponge and instrument count were correct x2.  Intrapartum complications:  IUGR Anesthesia:  epidural Episiotomy: none Lacerations: right labial Suture Repair: 3.0 vicryl rapide EBL (mL):   Infant: APGAR (1 MIN): 9   APGAR (5 MINS): 9   Infant weight: pending  Mom to postpartum.  Baby to Couplet care / Skin to Skin. Placenta to Pathology for IUGR   Plans to Breastfeed Contraception: Depo-Provera injections Circumcision: N/A  Note sent to [facility]: [facility] for pp visit.    Brand Males CNM 11/30/2020 9:47 AM

## 2020-07-04 ENCOUNTER — Other Ambulatory Visit: Payer: Self-pay

## 2020-07-04 ENCOUNTER — Encounter (HOSPITAL_COMMUNITY): Payer: Self-pay | Admitting: Obstetrics and Gynecology

## 2020-07-04 ENCOUNTER — Inpatient Hospital Stay (HOSPITAL_COMMUNITY)
Admission: AD | Admit: 2020-07-04 | Discharge: 2020-07-05 | Disposition: A | Discharge status: Home / Self Care | Payer: Medicaid Other | Attending: Obstetrics and Gynecology | Admitting: Obstetrics and Gynecology

## 2020-07-04 DIAGNOSIS — R300 Dysuria: Secondary | ICD-10-CM

## 2020-07-04 DIAGNOSIS — R109 Unspecified abdominal pain: Secondary | ICD-10-CM

## 2020-07-04 DIAGNOSIS — Z3A16 16 weeks gestation of pregnancy: Secondary | ICD-10-CM | POA: Diagnosis not present

## 2020-07-04 DIAGNOSIS — K5900 Constipation, unspecified: Secondary | ICD-10-CM

## 2020-07-04 DIAGNOSIS — O2342 Unspecified infection of urinary tract in pregnancy, second trimester: Secondary | ICD-10-CM | POA: Insufficient documentation

## 2020-07-04 DIAGNOSIS — O26892 Other specified pregnancy related conditions, second trimester: Secondary | ICD-10-CM | POA: Insufficient documentation

## 2020-07-04 DIAGNOSIS — B3731 Acute candidiasis of vulva and vagina: Secondary | ICD-10-CM

## 2020-07-04 DIAGNOSIS — N390 Urinary tract infection, site not specified: Secondary | ICD-10-CM | POA: Insufficient documentation

## 2020-07-04 DIAGNOSIS — B373 Candidiasis of vulva and vagina: Secondary | ICD-10-CM | POA: Insufficient documentation

## 2020-07-04 DIAGNOSIS — U071 COVID-19: Secondary | ICD-10-CM

## 2020-07-04 DIAGNOSIS — O98512 Other viral diseases complicating pregnancy, second trimester: Secondary | ICD-10-CM | POA: Diagnosis not present

## 2020-07-04 DIAGNOSIS — O98812 Other maternal infectious and parasitic diseases complicating pregnancy, second trimester: Secondary | ICD-10-CM | POA: Insufficient documentation

## 2020-07-04 DIAGNOSIS — O99612 Diseases of the digestive system complicating pregnancy, second trimester: Secondary | ICD-10-CM | POA: Diagnosis not present

## 2020-07-04 MED ORDER — ACETAMINOPHEN 500 MG PO TABS
1000.0000 mg | ORAL_TABLET | Freq: Once | ORAL | Status: AC
  Administered 2020-07-05: 1000 mg via ORAL
  Filled 2020-07-04: qty 2

## 2020-07-04 MED ORDER — ONDANSETRON 4 MG PO TBDP
8.0000 mg | ORAL_TABLET | Freq: Once | ORAL | Status: AC
  Administered 2020-07-05: 8 mg via ORAL
  Filled 2020-07-04: qty 2

## 2020-07-04 NOTE — MAU Note (Signed)
.  Heidi Martin is a 20 y.o. at [redacted]w[redacted]d here in MAU via EMS reporting: right sided abdominal pain that comes and goes since 1100 and has increasingly gotten worse. Reports vaginal bleeding that is spotting and when she wipes. As well as nausea. She states she tested positive for COVID either December 24th.  Pain score: 10/10 Vitals:   07/04/20 2307  BP: 103/61  Pulse: 77  Resp: 20  Temp: 97.7 F (36.5 C)  SpO2: 100%     FHT: 151

## 2020-07-05 ENCOUNTER — Inpatient Hospital Stay (HOSPITAL_COMMUNITY): Payer: Medicaid Other

## 2020-07-05 ENCOUNTER — Encounter: Payer: Self-pay | Admitting: *Deleted

## 2020-07-05 DIAGNOSIS — O98512 Other viral diseases complicating pregnancy, second trimester: Secondary | ICD-10-CM | POA: Diagnosis not present

## 2020-07-05 DIAGNOSIS — O99891 Other specified diseases and conditions complicating pregnancy: Secondary | ICD-10-CM

## 2020-07-05 DIAGNOSIS — O99612 Diseases of the digestive system complicating pregnancy, second trimester: Secondary | ICD-10-CM | POA: Diagnosis not present

## 2020-07-05 DIAGNOSIS — R109 Unspecified abdominal pain: Secondary | ICD-10-CM

## 2020-07-05 DIAGNOSIS — Z3A16 16 weeks gestation of pregnancy: Secondary | ICD-10-CM

## 2020-07-05 DIAGNOSIS — U071 COVID-19: Secondary | ICD-10-CM

## 2020-07-05 DIAGNOSIS — O99820 Streptococcus B carrier state complicating pregnancy: Secondary | ICD-10-CM

## 2020-07-05 DIAGNOSIS — R300 Dysuria: Secondary | ICD-10-CM

## 2020-07-05 DIAGNOSIS — O98812 Other maternal infectious and parasitic diseases complicating pregnancy, second trimester: Secondary | ICD-10-CM

## 2020-07-05 DIAGNOSIS — K5900 Constipation, unspecified: Secondary | ICD-10-CM | POA: Diagnosis not present

## 2020-07-05 DIAGNOSIS — B373 Candidiasis of vulva and vagina: Secondary | ICD-10-CM

## 2020-07-05 LAB — URINALYSIS, ROUTINE W REFLEX MICROSCOPIC
Bilirubin Urine: NEGATIVE
Glucose, UA: NEGATIVE mg/dL
Ketones, ur: 20 mg/dL — AB
Nitrite: NEGATIVE
Protein, ur: NEGATIVE mg/dL
Specific Gravity, Urine: 1.005 (ref 1.005–1.030)
WBC, UA: >50 WBC/hpf — ABNORMAL HIGH (ref 0–5)
pH: 5 (ref 5.0–8.0)

## 2020-07-05 LAB — RAPID URINE DRUG SCREEN, HOSP PERFORMED
Amphetamines: NOT DETECTED
Barbiturates: NOT DETECTED
Benzodiazepines: NOT DETECTED
Cocaine: NOT DETECTED
Opiates: NOT DETECTED
Tetrahydrocannabinol: NOT DETECTED

## 2020-07-05 LAB — CBC WITH DIFFERENTIAL/PLATELET
Abs Immature Granulocytes: 0.04 10*3/uL (ref 0.00–0.07)
Basophils Absolute: 0 10*3/uL (ref 0.0–0.1)
Basophils Relative: 0 %
Eosinophils Absolute: 0 10*3/uL (ref 0.0–0.5)
Eosinophils Relative: 0 %
HCT: 33.1 % — ABNORMAL LOW (ref 36.0–46.0)
Hemoglobin: 11 g/dL — ABNORMAL LOW (ref 12.0–15.0)
Immature Granulocytes: 0 %
Lymphocytes Relative: 20 %
Lymphs Abs: 2 10*3/uL (ref 0.7–4.0)
MCH: 26.2 pg (ref 26.0–34.0)
MCHC: 33.2 g/dL (ref 30.0–36.0)
MCV: 78.8 fL — ABNORMAL LOW (ref 80.0–100.0)
Monocytes Absolute: 0.8 10*3/uL (ref 0.1–1.0)
Monocytes Relative: 8 %
Neutro Abs: 6.9 10*3/uL (ref 1.7–7.7)
Neutrophils Relative %: 72 %
Platelets: 235 10*3/uL (ref 150–400)
RBC: 4.2 MIL/uL (ref 3.87–5.11)
RDW: 14.2 % (ref 11.5–15.5)
WBC: 9.7 10*3/uL (ref 4.0–10.5)
nRBC: 0 % (ref 0.0–0.2)

## 2020-07-05 LAB — GC/CHLAMYDIA PROBE AMP (~~LOC~~) NOT AT ARMC
Chlamydia: NEGATIVE
Comment: NEGATIVE
Comment: NORMAL
Neisseria Gonorrhea: NEGATIVE

## 2020-07-05 LAB — COMPREHENSIVE METABOLIC PANEL
ALT: 10 U/L (ref 0–44)
AST: 15 U/L (ref 15–41)
Albumin: 3.2 g/dL — ABNORMAL LOW (ref 3.5–5.0)
Alkaline Phosphatase: 46 U/L (ref 38–126)
Anion gap: 10 (ref 5–15)
BUN: <5 mg/dL — ABNORMAL LOW (ref 6–20)
CO2: 20 mmol/L — ABNORMAL LOW (ref 22–32)
Calcium: 8.8 mg/dL — ABNORMAL LOW (ref 8.9–10.3)
Chloride: 106 mmol/L (ref 98–111)
Creatinine, Ser: 0.64 mg/dL (ref 0.44–1.00)
GFR, Estimated: >60 mL/min (ref >60)
Glucose, Bld: 87 mg/dL (ref 70–99)
Potassium: 3.1 mmol/L — ABNORMAL LOW (ref 3.5–5.1)
Sodium: 136 mmol/L (ref 135–145)
Total Bilirubin: 0.4 mg/dL (ref 0.3–1.2)
Total Protein: 6.2 g/dL — ABNORMAL LOW (ref 6.5–8.1)

## 2020-07-05 LAB — WET PREP, GENITAL
Clue Cells Wet Prep HPF POC: NONE SEEN
Sperm: NONE SEEN
Trich, Wet Prep: NONE SEEN

## 2020-07-05 LAB — LIPASE, BLOOD: Lipase: 24 U/L (ref 11–51)

## 2020-07-05 MED ORDER — CEPHALEXIN 500 MG PO CAPS: 500.0000 mg | ORAL_CAPSULE | Freq: Four times a day (QID) | ORAL | 0 refills | Status: AC

## 2020-07-05 MED ORDER — ONDANSETRON 8 MG PO TBDP: 8.0000 mg | ORAL_TABLET | Freq: Three times a day (TID) | ORAL | 0 refills | Status: DC | PRN

## 2020-07-05 MED ORDER — MICONAZOLE NITRATE 200 MG VA SUPP: 200.0000 mg | Freq: Every day | VAGINAL | 0 refills | Status: DC

## 2020-07-05 MED ORDER — ACETAMINOPHEN 500 MG PO TABS: 1000.0000 mg | ORAL_TABLET | Freq: Three times a day (TID) | ORAL | 0 refills | Status: DC | PRN

## 2020-07-05 MED ORDER — POLYETHYLENE GLYCOL 3350 17 G PO PACK: 17.0000 g | PACK | Freq: Every day | ORAL | 1 refills | Status: DC | PRN

## 2020-07-05 NOTE — Discharge Instructions (Signed)
You presented with concern of abdominal pain, pain with urination and vaginal discomfort. Your testing showed a vaginal yeast infection. Your symptoms and rapid urine test were consistent with a worsening urinary tract infection. You may take tylenol as needed for pain. You may take zofran as needed for nausea. Stop your amoxicillin and START cephalexin 500mg  every 6 hours for 5 days. Administer vaginal suppository nightly for 3 days for yeast infection. Please follow-up with your prenatal provider as scheduled or sooner if concern of worsening or persistent symptoms despite the treatments prescribed. It is very important that you take your antibiotic as prescribed and complete the entire course of medication.  Pregnancy and Urinary Tract Infection  A urinary tract infection (UTI) is an infection of any part of the urinary tract. This includes the kidneys, the tubes that connect your kidneys to your bladder (ureters), the bladder, and the tube that carries urine out of your body (urethra). These organs make, store, and get rid of urine in the body. Your health care provider may use other names to describe the infection. An upper UTI affects the ureters and kidneys (pyelonephritis). A lower UTI affects the bladder (cystitis) and urethra (urethritis). Most urinary tract infections are caused by bacteria in your genital area, around the entrance to your urinary tract (urethra). These bacteria grow and cause irritation and inflammation of your urinary tract. You are more likely to develop a UTI during pregnancy because the physical and hormonal changes your body goes through can make it easier for bacteria to get into your urinary tract. Your growing baby also puts pressure on your bladder and can affect urine flow. It is important to recognize and treat UTIs in pregnancy because of the risk of serious complications for both you and your baby. How does this affect me? Symptoms of a UTI include:  Needing  to urinate right away (urgently).  Frequent urination or passing small amounts of urine frequently.  Pain or burning with urination.  Blood in the urine.  Urine that smells bad or unusual.  Trouble urinating.  Cloudy urine.  Pain in the abdomen or lower back.  Vaginal discharge. You may also have:  Vomiting or a decreased appetite.  Confusion.  Irritability or tiredness.  A fever.  Diarrhea. How does this affect my baby? An untreated UTI during pregnancy could lead to a kidney infection or a systemic infection, which can cause health problems that could affect your baby. Possible complications of an untreated UTI include:  Giving birth to your baby before 37 weeks of pregnancy (premature).  Having a baby with a low birth weight.  Developing high blood pressure during pregnancy (preeclampsia).  Having a low hemoglobin level (anemia). What can I do to lower my risk? To prevent a UTI:  Go to the bathroom as soon as you feel the need. Do not hold urine for long periods of time.  Always wipe from front to back, especially after a bowel movement. Use each tissue one time when you wipe.  Empty your bladder after sex.  Keep your genital area dry.  Drink 6-10 glasses of water each day.  Do not douche or use deodorant sprays. How is this treated? Treatment for this condition may include:  Antibiotic medicines that are safe to take during pregnancy.  Other medicines to treat less common causes of UTI. Follow these instructions at home:  If you were prescribed an antibiotic medicine, take it as told by your health care provider. Do not stop using  the antibiotic even if you start to feel better.  Keep all follow-up visits as told by your health care provider. This is important. Contact a health care provider if:  Your symptoms do not improve or they get worse.  You have abnormal vaginal discharge. Get help right away if you:  Have a fever.  Have nausea and  vomiting.  Have back or side pain.  Feel contractions in your uterus.  Have lower belly pain.  Have a gush of fluid from your vagina.  Have blood in your urine. Summary  A urinary tract infection (UTI) is an infection of any part of the urinary tract, which includes the kidneys, ureters, bladder, and urethra.  Most urinary tract infections are caused by bacteria in your genital area, around the entrance to your urinary tract (urethra).  You are more likely to develop a UTI during pregnancy.  If you were prescribed an antibiotic medicine, take it as told by your health care provider. Do not stop using the antibiotic even if you start to feel better. This information is not intended to replace advice given to you by your health care provider. Make sure you discuss any questions you have with your health care provider. Document Revised: 10/11/2018 Document Reviewed: 05/23/2018 Elsevier Patient Education  2020 [employer].   Vaginal Yeast Infection, Adult  Vaginal yeast infection is a condition that causes vaginal discharge as well as soreness, swelling, and redness (inflammation) of the vagina. This is a common condition. Some women get this infection frequently. What are the causes? This condition is caused by a change in the normal balance of the yeast (candida) and bacteria that live in the vagina. This change causes an overgrowth of yeast, which causes the inflammation. What increases the risk? The condition is more likely to develop in women who:  Take antibiotic medicines.  Have diabetes.  Take birth control pills.  Are pregnant.  Douche often.  Have a weak body defense system (immune system).  Have been taking steroid medicines for a long time.  Frequently wear tight clothing. What are the signs or symptoms? Symptoms of this condition include:  White, thick, creamy vaginal discharge.  Swelling, itching, redness, and irritation of the vagina. The lips of the  vagina (vulva) may be affected as well.  Pain or a burning feeling while urinating.  Pain during sex. How is this diagnosed? This condition is diagnosed based on:  Your medical history.  A physical exam.  A pelvic exam. Your health care provider will examine a sample of your vaginal discharge under a microscope. Your health care provider may send this sample for testing to confirm the diagnosis. How is this treated? This condition is treated with medicine. Medicines may be over-the-counter or prescription. You may be told to use one or more of the following:  Medicine that is taken by mouth (orally).  Medicine that is applied as a cream (topically).  Medicine that is inserted directly into the vagina (suppository). Follow these instructions at home:  Lifestyle  Do not have sex until your health care provider approves. Tell your sex partner that you have a yeast infection. That person should go to his or her health care provider and ask if they should also be treated.  Do not wear tight clothes, such as pantyhose or tight pants.  Wear breathable cotton underwear. General instructions  Take or apply over-the-counter and prescription medicines only as told by your health care provider.  Eat more yogurt. This may help  to keep your yeast infection from returning.  Do not use tampons until your health care provider approves.  Try taking a sitz bath to help with discomfort. This is a warm water bath that is taken while you are sitting down. The water should only come up to your hips and should cover your buttocks. Do this 3-4 times per day or as told by your health care provider.  Do not douche.  If you have diabetes, keep your blood sugar levels under control.  Keep all follow-up visits as told by your health care provider. This is important. Contact a health care provider if:  You have a fever.  Your symptoms go away and then return.  Your symptoms do not get better with  treatment.  Your symptoms get worse.  You have new symptoms.  You develop blisters in or around your vagina.  You have blood coming from your vagina and it is not your menstrual period.  You develop pain in your abdomen. Summary  Vaginal yeast infection is a condition that causes discharge as well as soreness, swelling, and redness (inflammation) of the vagina.  This condition is treated with medicine. Medicines may be over-the-counter or prescription.  Take or apply over-the-counter and prescription medicines only as told by your health care provider.  Do not douche. Do not have sex or use tampons until your health care provider approves.  Contact a health care provider if your symptoms do not get better with treatment or your symptoms go away and then return. This information is not intended to replace advice given to you by your health care provider. Make sure you discuss any questions you have with your health care provider. Document Revised: 01/17/2019 Document Reviewed: 11/05/2017 Elsevier Patient Education  2020 [employer].

## 2020-07-05 NOTE — MAU Provider Note (Signed)
Chief Complaint: Abdominal Pain   Event Date/Time   First Provider Initiated Contact with Patient 07/04/20 2318     SUBJECTIVE HPI: Heidi Martin is a 20 y.o. G2P0010 at [redacted]w[redacted]d by 8 week ultrasound who presents to maternity admissions reporting concern of severe abdominal cramping in the setting of recent COVID-19 diagnosis on 06/25/20. Pt reports severe, sharp, intermittent right-sided abdominal pain since 1100 on 07/04/20 which had worsened throughout the day. No known alleviating or exacerbating symptoms. Pt reports associated nausea but no emesis. She also reports dysuria and vaginal discomfort. She has been taking amoxicillin for +GBS UTI; script (amoxicillin 500mg  BID x5d) was sent to pharmacy on 06/28/21. Pt reports that she had been taking antibiotics regularly with last dose yesterday. She states that she has almost completed the antibiotic course. Intrauterine pregnancy previously confirmed on formal ultrasound at 8 and 12 weeks. She reports minimal vaginal spotting (blood spots on toilet tissue when wiping) but denies headache, dizziness, n/v, or fever/chills. She also endorses constipation; most recently 1 hard BM yesterday.  Past Medical History:  Diagnosis Date  . COVID-19   . Irritable bowel syndrome (IBS)   . Sickle cell trait [facility])    Past Surgical History:  Procedure Laterality Date  . INDUCED ABORTION    . NO PAST SURGERIES     Social History   Socioeconomic History  . Marital status: Single    Spouse name: Not on file  . Number of children: Not on file  . Years of education: Not on file  . Highest education level: Not on file  Occupational History  . Not on file  Tobacco Use  . Smoking status: Never Smoker  . Smokeless tobacco: Never Used  Vaping Use  . Vaping Use: Never used  Substance and Sexual Activity  . Alcohol use: No  . Drug use: No  . Sexual activity: Not Currently    Birth control/protection: None  Other Topics Concern  . Not on file  Social  History Narrative  . Not on file   Social Determinants of Health   Financial Resource Strain: Not on file  Food Insecurity: Not on file  Transportation Needs: Not on file  Physical Activity: Not on file  Stress: Not on file  Social Connections: Not on file  Intimate Partner Violence: Not on file   No current facility-administered medications on file prior to encounter.   Current Outpatient Medications on File Prior to Encounter  Medication Sig Dispense Refill  . prenatal vitamin w/FE, FA (PRENATAL 1 + 1) 27-1 MG TABS tablet Take 1 tablet by mouth daily at 12 noon.    . [DISCONTINUED] dicyclomine (BENTYL) 20 MG tablet Take 1 tablet (20 mg total) by mouth 2 (two) times daily. (Patient not taking: Reported on 02/14/2020) 20 tablet 0  . [DISCONTINUED] metoCLOPramide (REGLAN) 10 MG tablet Take 1 tablet (10 mg total) by mouth 3 (three) times daily between meals as needed for nausea. (Patient not taking: Reported on 02/14/2020) 30 tablet 0  . [DISCONTINUED] promethazine (PHENERGAN) 12.5 MG tablet Take 1-2 tablets (12.5-25 mg total) by mouth at bedtime. (Patient not taking: Reported on 02/14/2020) 30 tablet 0   Allergies  Allergen Reactions  . Milk [Lac Bovis] Nausea And Vomiting    Stomach cramping    ROS:  Review of Systems  Constitutional: Negative for chills and fever.  Eyes: Negative for photophobia.  Respiratory: Negative for cough and shortness of breath.   Cardiovascular: Negative for chest pain.  Gastrointestinal: Positive for  abdominal pain, constipation and nausea. Negative for diarrhea and vomiting.  Genitourinary: Positive for dysuria, vaginal discharge and vaginal pain. Negative for flank pain and vaginal bleeding.  Musculoskeletal: Negative for back pain.  Neurological: Negative for headaches.   I have reviewed patient's Past Medical Hx, Surgical Hx, Family Hx, Social Hx, medications and allergies.   Physical Exam   Patient Vitals for the past 24 hrs:  BP Temp Temp src  Pulse Resp SpO2  07/05/20 0430 124/71 -- -- 82 -- --  07/04/20 2307 103/61 97.7 F (36.5 C) Oral 77 20 100 %   Constitutional: Well-developed, well-nourished female in no acute distress. Initially writhing around on stretcher but now sitting comfortably on stretcher s/p tylenol and zofran. Cardiovascular: normal rate  Respiratory: normal effort GI: Abd soft, non-distended, moderate diffuse tenderness to palpation with guarding on arrival but resolution of tenderness s/p tylenol and zofran. Pos BS x 4. No rebound tenderness. No CVA tenderness. Negative Murphy's sign. MS: Extremities nontender, no edema, normal ROM. Neurologic: Alert and oriented x 4. GU: Neg CVAT.  PELVIC EXAM: Cervix pink, visually closed, without lesion, copious yellowish-whitish creamy discharge, vaginal walls and external genitalia normal, notable discomfort with speculum exam Bimanual exam: deferred  FHT 151 by Doppler.  LAB RESULTS Results for orders placed or performed during the hospital encounter of 07/04/20 (from the past 24 hour(s))  GC/Chlamydia probe amp ([place])not at [facility]     Status: None   Collection Time: 07/04/20 11:37 PM  Result Value Ref Range   Chlamydia Negative    Neisseria Gonorrhea Negative    Comment Normal Reference Ranger Chlamydia - Negative    Comment      Normal Reference Range Neisseria Gonorrhea - Negative  Wet prep, genital     Status: Abnormal   Collection Time: 07/04/20 11:51 PM  Result Value Ref Range   Yeast Wet Prep HPF POC PRESENT (A) NONE SEEN   Trich, Wet Prep NONE SEEN NONE SEEN   Clue Cells Wet Prep HPF POC NONE SEEN NONE SEEN   WBC, Wet Prep HPF POC MANY (A) NONE SEEN   Sperm NONE SEEN   CBC with Differential/Platelet     Status: Abnormal   Collection Time: 07/05/20 12:08 AM  Result Value Ref Range   WBC 9.7 4.0 - 10.5 K/uL   RBC 4.20 3.87 - 5.11 MIL/uL   Hemoglobin 11.0 (L) 12.0 - 15.0 g/dL   HCT 33.1 (L) 36.0 - 46.0 %   MCV 78.8 (L) 80.0 - 100.0 fL   MCH  26.2 26.0 - 34.0 pg   MCHC 33.2 30.0 - 36.0 g/dL   RDW 14.2 11.5 - 15.5 %   Platelets 235 150 - 400 K/uL   nRBC 0.0 0.0 - 0.2 %   Neutrophils Relative % 72 %   Neutro Abs 6.9 1.7 - 7.7 K/uL   Lymphocytes Relative 20 %   Lymphs Abs 2.0 0.7 - 4.0 K/uL   Monocytes Relative 8 %   Monocytes Absolute 0.8 0.1 - 1.0 K/uL   Eosinophils Relative 0 %   Eosinophils Absolute 0.0 0.0 - 0.5 K/uL   Basophils Relative 0 %   Basophils Absolute 0.0 0.0 - 0.1 K/uL   Immature Granulocytes 0 %   Abs Immature Granulocytes 0.04 0.00 - 0.07 K/uL  Comprehensive metabolic panel     Status: Abnormal   Collection Time: 07/05/20 12:08 AM  Result Value Ref Range   Sodium 136 135 - 145 mmol/L   Potassium 3.1 (L)  3.5 - 5.1 mmol/L   Chloride 106 98 - 111 mmol/L   CO2 20 (L) 22 - 32 mmol/L   Glucose, Bld 87 70 - 99 mg/dL   BUN <5 (L) 6 - 20 mg/dL   Creatinine, Ser 4.40 0.44 - 1.00 mg/dL   Calcium 8.8 (L) 8.9 - 10.3 mg/dL   Total Protein 6.2 (L) 6.5 - 8.1 g/dL   Albumin 3.2 (L) 3.5 - 5.0 g/dL   AST 15 15 - 41 U/L   ALT 10 0 - 44 U/L   Alkaline Phosphatase 46 38 - 126 U/L   Total Bilirubin 0.4 0.3 - 1.2 mg/dL   GFR, Estimated >34 >74 mL/min   Anion gap 10 5 - 15  Lipase, blood     Status: None   Collection Time: 07/05/20 12:08 AM  Result Value Ref Range   Lipase 24 11 - 51 U/L  Rapid urine drug screen (hospital performed)     Status: None   Collection Time: 07/05/20  3:27 AM  Result Value Ref Range   Opiates NONE DETECTED NONE DETECTED   Cocaine NONE DETECTED NONE DETECTED   Benzodiazepines NONE DETECTED NONE DETECTED   Amphetamines NONE DETECTED NONE DETECTED   Tetrahydrocannabinol NONE DETECTED NONE DETECTED   Barbiturates NONE DETECTED NONE DETECTED  Urinalysis, Routine w reflex microscopic     Status: Abnormal   Collection Time: 07/05/20  3:27 AM  Result Value Ref Range   Color, Urine STRAW (A) YELLOW   APPearance HAZY (A) CLEAR   Specific Gravity, Urine 1.005 1.005 - 1.030   pH 5.0 5.0 - 8.0    Glucose, UA NEGATIVE NEGATIVE mg/dL   Hgb urine dipstick MODERATE (A) NEGATIVE   Bilirubin Urine NEGATIVE NEGATIVE   Ketones, ur 20 (A) NEGATIVE mg/dL   Protein, ur NEGATIVE NEGATIVE mg/dL   Nitrite NEGATIVE NEGATIVE   Leukocytes,Ua LARGE (A) NEGATIVE   RBC / HPF 0-5 0 - 5 RBC/hpf   WBC, UA >50 (H) 0 - 5 WBC/hpf   Bacteria, UA RARE (A) NONE SEEN   Squamous Epithelial / LPF 0-5 0 - 5    O/Positive/-- (12/22 1433)  IMAGING No results found.  MAU Management/MDM: Orders Placed This Encounter  Procedures  . Wet prep, genital  . OB Urine Culture  . CBC with Differential/Platelet  . Comprehensive metabolic panel  . Lipase, blood  . Rapid urine drug screen (hospital performed)  . Urinalysis, Routine w reflex microscopic  . Discharge patient Discharge disposition: 01-Home or Self Care; Discharge patient date: 07/05/2020    Meds ordered this encounter  Medications  . acetaminophen (TYLENOL) tablet 1,000 mg  . ondansetron (ZOFRAN-ODT) disintegrating tablet 8 mg  . ondansetron (ZOFRAN ODT) 8 MG disintegrating tablet    Sig: Take 1 tablet (8 mg total) by mouth every 8 (eight) hours as needed for nausea or vomiting.    Dispense:  20 tablet    Refill:  0  . miconazole (MICOTIN) 200 MG vaginal suppository    Sig: Place 1 suppository (200 mg total) vaginally at bedtime.    Dispense:  3 suppository    Refill:  0  . acetaminophen (TYLENOL) 500 MG tablet    Sig: Take 2 tablets (1,000 mg total) by mouth every 8 (eight) hours as needed.    Dispense:  30 tablet    Refill:  0  . polyethylene glycol (MIRALAX / GLYCOLAX) 17 g packet    Sig: Take 17 g by mouth daily as needed for mild constipation  or moderate constipation.    Dispense:  14 each    Refill:  1  . cephALEXin (KEFLEX) 500 MG capsule    Sig: Take 1 capsule (500 mg total) by mouth 4 (four) times daily for 5 days.    Dispense:  20 capsule    Refill:  0    Treatments in MAU included tylenol and zofran. Pt discharged with strict  return precautions for worsening or persistent symptoms.  CBC without evidence of leukocytosis. Mild anemia (Hgb 11.0). Unremarkable CMP. Symptoms nearly resolved s/p tylenol and zofran administered.   ASSESSMENT Heidi Martin is a 20 y.o. G2P0010 at [redacted]w[redacted]d by 8 week ultrasound who presents to maternity admissions reporting concern of severe abdominal cramping in the setting of recent COVID-19 diagnosis on 06/25/20. Reassuringly, abdominal pain nearly resolved s/p tylenol and zofran in the MAU. 1. COVID-19: Now 11 days out from diagnosis. Mild symptoms with normal vital signs and O2 saturation today. S/p COVID vaccine prior to diagnosis. - provided strict return precautions for worsening symptoms  2. Right sided abdominal pain: Pt presented via EMS given concern of severe, sharp, intermittent right-sided abdominal pain. Initial exam with diffuse, moderate tenderness to palpation throughout in addition to guarding. However, s/p tylenol and zofran pt with improved exam and overall clinical appearance. Reassuringly, previously confirmed intrauterine pregnancy. CBC, CMP and lipase unremarkable. Wet prep notable for candida and UA notable for large LE, >50 WBC and bacteria will treat empirically for UTI with keflex 500mg  QID x5d. No signs/symptoms concerning for pyelonephritis or nephrolithiasis at this time. Constipation and vaginal yeast may also be contributing. GC/C and UCx pending at time of discharge. -instructed pt to stop amox and start keflex as noted above -provided strict return precautions for worsening or persistent symptoms despite antibiotics -emphasized importance of good hydration and use of tylenol as needed for discomfort  3. Dysuria  Recent +GBS Urine: Pt reports worsening dysuria today in setting of recent prescription for amoxicillin 500mg  BID for asymptomatic bacteruria. Pt reports taking with good adherence but still has not completed a 5 day course prescribed on 12/27. Given  worsening dysuria and +UA will treat empirically for UTI and f/u UCx as noted above.  4. Vaginal candida: Wet prep positive for candida in setting of recent antibiotics for +GBS in urine. -prescribed miconazole x3d today  5. Constipation, unspecified constipation type: Pt reports notable constipation. Hard stool yesterday. Likely contributing to pt's abdominal discomfort. -prescribed miralax daily prn -counseled on importance of good hydration and increased fiber intake    PLAN Discharge home; f/u with prenatal provider as previously scheduled Provided strict return precautions for recurrent severe abdominal pain, persistent n/v, vaginal bleeding or other concerns. Allergies as of 07/05/2020      Reactions   Milk [lac Bovis] Nausea And Vomiting   Stomach cramping      Medication List    STOP taking these medications   amoxicillin 500 MG tablet Commonly known as: AMOXIL   Blood Pressure Monitoring Devi     TAKE these medications   acetaminophen 500 MG tablet Commonly known as: TYLENOL Take 2 tablets (1,000 mg total) by mouth every 8 (eight) hours as needed.   cephALEXin 500 MG capsule Commonly known as: KEFLEX Take 1 capsule (500 mg total) by mouth 4 (four) times daily for 5 days.   miconazole 200 MG vaginal suppository Commonly known as: MICOTIN Place 1 suppository (200 mg total) vaginally at bedtime.   ondansetron 8 MG disintegrating tablet Commonly known as:  Zofran ODT Take 1 tablet (8 mg total) by mouth every 8 (eight) hours as needed for nausea or vomiting.   polyethylene glycol 17 g packet Commonly known as: MIRALAX / GLYCOLAX Take 17 g by mouth daily as needed for mild constipation or moderate constipation.   prenatal vitamin w/FE, FA 27-1 MG Tabs tablet Take 1 tablet by mouth daily at 12 noon.       Sheila Oats, MD OB Fellow, Faculty Practice 07/05/2020 10:35 PM

## 2020-07-07 LAB — CULTURE, OB URINE: Culture: 10000 — AB

## 2020-07-21 ENCOUNTER — Other Ambulatory Visit: Payer: Self-pay | Admitting: Family Medicine

## 2020-07-21 ENCOUNTER — Other Ambulatory Visit: Payer: Self-pay | Admitting: *Deleted

## 2020-07-21 ENCOUNTER — Ambulatory Visit: Payer: Medicaid Other | Attending: Family Medicine

## 2020-07-21 ENCOUNTER — Encounter: Payer: Self-pay | Admitting: *Deleted

## 2020-07-21 ENCOUNTER — Ambulatory Visit (INDEPENDENT_AMBULATORY_CARE_PROVIDER_SITE_OTHER): Payer: Medicaid Other | Admitting: Student

## 2020-07-21 ENCOUNTER — Other Ambulatory Visit: Payer: Self-pay

## 2020-07-21 VITALS — BP 106/73 | HR 66 | Wt 152.3 lb

## 2020-07-21 DIAGNOSIS — Z3480 Encounter for supervision of other normal pregnancy, unspecified trimester: Secondary | ICD-10-CM

## 2020-07-21 DIAGNOSIS — Z3686 Encounter for antenatal screening for cervical length: Secondary | ICD-10-CM

## 2020-07-21 DIAGNOSIS — Z3A19 19 weeks gestation of pregnancy: Secondary | ICD-10-CM

## 2020-07-21 NOTE — Patient Instructions (Signed)
Blood pressure cuff [facility]  [address], [place] [address]  Phone: [phone] 

## 2020-07-21 NOTE — Progress Notes (Signed)
   PRENATAL VISIT NOTE  Subjective:  Heidi Martin is a 20 y.o. G2P0010 at 110w1d being seen today for ongoing prenatal care.  She is currently monitored for the following issues for this low-risk pregnancy and has Supervision of other normal pregnancy, antepartum; History of therapeutic abortion; Positive GBS test; and Sickle cell trait (HCC) on their problem list.  Patient reports no complaints. Was seen in MAU for [facility]. She took medicine for the bacteria Relieved by soaking in bathtub .  Contractions: Not present. Vag. Bleeding: None.  Movement: Present. Denies leaking of fluid.   The following portions of the patient's history were reviewed and updated as appropriate: allergies, current medications, past family history, past medical history, past social history, past surgical history and problem list.   Objective:   Vitals:   07/21/20 0852  BP: 106/73  Pulse: 66  Weight: 152 lb 4.8 oz (69.1 kg)    Fetal Status: Fetal Heart Rate (bpm): 152   Movement: Present     General:  Alert, oriented and cooperative. Patient is in no acute distress.  Skin: Skin is warm and dry. No rash noted.   Cardiovascular: Normal heart rate noted  Respiratory: Normal respiratory effort, no problems with respiration noted  Abdomen: Soft, gravid, appropriate for gestational age.  Pain/Pressure: Present     Pelvic: Cervical exam deferred        Extremities: Normal range of motion.  Edema: None  Mental Status: Normal mood and affect. Normal behavior. Normal judgment and thought content.   Assessment and Plan:  Pregnancy: G2P0010 at [redacted]w[redacted]d 1. Supervision of other normal pregnancy, antepartum -Finished antibiotic for GBS -recommended yoga, exercise for her Deberah Pelton, also recommended joining pool for hydrotherapy -will collect urine sample -patient will get BP cuff, ok to do home monitoring of blood pressure -given genetic testing results (located in files; RN to scan in)  Preterm labor  symptoms and general obstetric precautions including but not limited to vaginal bleeding, contractions, leaking of fluid and fetal movement were reviewed in detail with the patient. Please refer to After Visit Summary for other counseling recommendations.   No follow-ups on file.  Future Appointments  Date Time Provider Department Center  07/21/2020  9:45 AM [facility] US[facility]    Marylene Land, CNM

## 2020-07-22 ENCOUNTER — Other Ambulatory Visit: Payer: Self-pay | Admitting: Family Medicine

## 2020-07-23 ENCOUNTER — Encounter: Payer: Self-pay | Admitting: Family Medicine

## 2020-07-23 ENCOUNTER — Other Ambulatory Visit: Payer: Self-pay | Admitting: Family Medicine

## 2020-07-23 DIAGNOSIS — N883 Incompetence of cervix uteri: Secondary | ICD-10-CM

## 2020-07-23 MED ORDER — PROGESTERONE 200 MG PO CAPS: 200.0000 mg | ORAL_CAPSULE | Freq: Every evening | ORAL | 6 refills | Status: DC

## 2020-07-23 NOTE — Progress Notes (Signed)
Patient incidentally noted to have short cervix 2.2cm on anatomy ultrasound. Vaginal progesterone rx sent to patient's pharmacy. Notified by MFM at anatomy scan.  Alric Seton, MD OB Fellow, Faculty [facility], [facility] 07/23/2020 11:53 PM

## 2020-07-24 ENCOUNTER — Encounter (HOSPITAL_COMMUNITY): Payer: Self-pay | Admitting: Obstetrics and Gynecology

## 2020-07-24 ENCOUNTER — Inpatient Hospital Stay (HOSPITAL_COMMUNITY): Payer: Medicaid Other

## 2020-07-24 ENCOUNTER — Inpatient Hospital Stay (HOSPITAL_COMMUNITY)
Admission: AD | Admit: 2020-07-24 | Discharge: 2020-07-24 | Disposition: A | Discharge status: Home / Self Care | Payer: Medicaid Other | Attending: Obstetrics and Gynecology | Admitting: Obstetrics and Gynecology

## 2020-07-24 DIAGNOSIS — R109 Unspecified abdominal pain: Secondary | ICD-10-CM | POA: Insufficient documentation

## 2020-07-24 DIAGNOSIS — O99612 Diseases of the digestive system complicating pregnancy, second trimester: Secondary | ICD-10-CM | POA: Insufficient documentation

## 2020-07-24 DIAGNOSIS — O26872 Cervical shortening, second trimester: Secondary | ICD-10-CM

## 2020-07-24 DIAGNOSIS — Z3480 Encounter for supervision of other normal pregnancy, unspecified trimester: Secondary | ICD-10-CM

## 2020-07-24 DIAGNOSIS — O99012 Anemia complicating pregnancy, second trimester: Secondary | ICD-10-CM

## 2020-07-24 DIAGNOSIS — K589 Irritable bowel syndrome without diarrhea: Secondary | ICD-10-CM | POA: Diagnosis not present

## 2020-07-24 DIAGNOSIS — R1011 Right upper quadrant pain: Secondary | ICD-10-CM

## 2020-07-24 DIAGNOSIS — O26892 Other specified pregnancy related conditions, second trimester: Secondary | ICD-10-CM | POA: Diagnosis not present

## 2020-07-24 DIAGNOSIS — Z3A19 19 weeks gestation of pregnancy: Secondary | ICD-10-CM | POA: Diagnosis not present

## 2020-07-24 LAB — CBC WITH DIFFERENTIAL/PLATELET
Abs Immature Granulocytes: 0.04 10*3/uL (ref 0.00–0.07)
Basophils Absolute: 0 10*3/uL (ref 0.0–0.1)
Basophils Relative: 0 %
Eosinophils Absolute: 0.1 10*3/uL (ref 0.0–0.5)
Eosinophils Relative: 1 %
HCT: 31.9 % — ABNORMAL LOW (ref 36.0–46.0)
Hemoglobin: 10.2 g/dL — ABNORMAL LOW (ref 12.0–15.0)
Immature Granulocytes: 0 %
Lymphocytes Relative: 20 %
Lymphs Abs: 1.8 10*3/uL (ref 0.7–4.0)
MCH: 26.3 pg (ref 26.0–34.0)
MCHC: 32 g/dL (ref 30.0–36.0)
MCV: 82.2 fL (ref 80.0–100.0)
Monocytes Absolute: 0.9 10*3/uL (ref 0.1–1.0)
Monocytes Relative: 10 %
Neutro Abs: 6.5 10*3/uL (ref 1.7–7.7)
Neutrophils Relative %: 69 %
Platelets: 231 10*3/uL (ref 150–400)
RBC: 3.88 MIL/uL (ref 3.87–5.11)
RDW: 14.6 % (ref 11.5–15.5)
WBC: 9.3 10*3/uL (ref 4.0–10.5)
nRBC: 0 % (ref 0.0–0.2)

## 2020-07-24 LAB — URINALYSIS, ROUTINE W REFLEX MICROSCOPIC
Bacteria, UA: NONE SEEN
Bilirubin Urine: NEGATIVE
Glucose, UA: NEGATIVE mg/dL
Hgb urine dipstick: NEGATIVE
Ketones, ur: NEGATIVE mg/dL
Nitrite: NEGATIVE
Protein, ur: NEGATIVE mg/dL
Specific Gravity, Urine: 1.02 (ref 1.005–1.030)
pH: 6 (ref 5.0–8.0)

## 2020-07-24 LAB — COMPREHENSIVE METABOLIC PANEL
ALT: 8 U/L (ref 0–44)
AST: 22 U/L (ref 15–41)
Albumin: 2.9 g/dL — ABNORMAL LOW (ref 3.5–5.0)
Alkaline Phosphatase: 43 U/L (ref 38–126)
Anion gap: 11 (ref 5–15)
BUN: 6 mg/dL (ref 6–20)
CO2: 17 mmol/L — ABNORMAL LOW (ref 22–32)
Calcium: 8.4 mg/dL — ABNORMAL LOW (ref 8.9–10.3)
Chloride: 108 mmol/L (ref 98–111)
Creatinine, Ser: 0.64 mg/dL (ref 0.44–1.00)
GFR, Estimated: >60 mL/min (ref >60)
Glucose, Bld: 86 mg/dL (ref 70–99)
Potassium: 4.4 mmol/L (ref 3.5–5.1)
Sodium: 136 mmol/L (ref 135–145)
Total Bilirubin: 0.3 mg/dL (ref 0.3–1.2)
Total Protein: 5.8 g/dL — ABNORMAL LOW (ref 6.5–8.1)

## 2020-07-24 LAB — WET PREP, GENITAL
Clue Cells Wet Prep HPF POC: NONE SEEN
Sperm: NONE SEEN
Trich, Wet Prep: NONE SEEN
Yeast Wet Prep HPF POC: NONE SEEN

## 2020-07-24 LAB — LIPASE, BLOOD: Lipase: 28 U/L (ref 11–51)

## 2020-07-24 MED ORDER — PROGESTERONE 200 MG VA SUPP
200.0000 mg | Freq: Once | VAGINAL | Status: DC
  Filled 2020-07-24: qty 1

## 2020-07-24 MED ORDER — CYCLOBENZAPRINE HCL 5 MG PO TABS
10.0000 mg | ORAL_TABLET | Freq: Once | ORAL | Status: AC
  Administered 2020-07-24: 10 mg via ORAL
  Filled 2020-07-24: qty 2

## 2020-07-24 MED ORDER — ACETAMINOPHEN 500 MG PO TABS
1000.0000 mg | ORAL_TABLET | Freq: Once | ORAL | Status: AC
  Administered 2020-07-24: 1000 mg via ORAL
  Filled 2020-07-24: qty 2

## 2020-07-24 MED ORDER — LIDOCAINE VISCOUS HCL 2 % MT SOLN
15.0000 mL | Freq: Once | OROMUCOSAL | Status: AC
  Administered 2020-07-24: 15 mL via ORAL
  Filled 2020-07-24: qty 15

## 2020-07-24 MED ORDER — ALUM & MAG HYDROXIDE-SIMETH 200-200-20 MG/5ML PO SUSP
30.0000 mL | Freq: Once | ORAL | Status: AC
  Administered 2020-07-24: 30 mL via ORAL
  Filled 2020-07-24: qty 30

## 2020-07-24 MED ORDER — FERROUS SULFATE 325 (65 FE) MG PO TBEC: 325.0000 mg | DELAYED_RELEASE_TABLET | ORAL | 1 refills | Status: DC

## 2020-07-24 NOTE — Discharge Instructions (Signed)
Pregnancy and Anemia  Anemia is a condition in which there is not enough red blood cells or hemoglobin in the blood. Hemoglobin is a substance in red blood cells that carries oxygen. When you do not have enough red blood cells or hemoglobin (are anemic), your body cannot get enough oxygen and your organs may not work properly. Anemia is common during pregnancy because your body needs more blood volume and blood cells to provide nutrition to the unborn baby. What are the causes? The most common cause of anemia during pregnancy is not having enough iron in the body to make red blood cells (iron deficiency anemia). Other causes may include:  Folic acid deficiency.  Vitamin B12 deficiency.  Certain prescription or over-the-counter medicines.  Certain medical conditions or infections that destroy red blood cells.  A low platelet count and bleeding caused by antibodies that go through the placenta to the baby from the mother's blood. What are the signs or symptoms? Mild anemia may not cause any symptoms. If anemia becomes severe, symptoms may include:  Feeling tired or weak.  Shortness of breath, especially during activity.  Fainting.  Pale skin.  Headaches.  A fast or irregular heartbeat.  Dizziness. How is this diagnosed? This condition may be diagnosed based on your medical history and a physical exam. You may also have blood tests. How is this treated? Treatment for anemia during pregnancy depends on the cause of the anemia. Treatment may include:  Making changes to your diet.  Taking iron, vitamin B12, or folic acid supplements.  Having a blood transfusion. This may be needed if the anemia is severe. Follow these instructions at home: Eating and drinking  Follow recommendations from your health care provider about changing your diet.  Eat a diet rich in iron. This would include foods such as: ? Liver. ? Beef. ? Eggs. ? Whole grains. ? Spinach. ? Dried  fruit.  Increase your vitamin C intake. This will help the stomach absorb more iron. Some foods that are high in vitamin C include: ? Oranges. ? Peppers. ? Tomatoes. ? Mangoes.  Eat green leafy vegetables. These are a good source of folic acid. General instructions  Take iron supplements and vitamins as told by your health care provider.  Keep all follow-up visits. This is important. Contact a health care provider if:  You have headaches that happen often or do not go away.  You bruise easily.  You have a fever.  You have nausea and vomiting for more than 24 hours.  You are unable to take supplements prescribed to treat your anemia. Get help right away if:  You develop signs or symptoms of severe anemia.  You have bleeding from your vagina.  You develop a rash.  You have bloody or tarry stools.  You are very dizzy or you faint. Summary  Anemia is a condition in which there is not enough red blood cells or hemoglobin in the blood.  The most common cause of anemia during pregnancy is not having enough iron in the body to make red blood cells (iron deficiency anemia).  Mild anemia may not cause any symptoms. If it becomes severe, symptoms may include feeling tired and weak.  Take iron supplements and vitamins as told by your health care provider.  Keep all follow-up visits. This is important. This information is not intended to replace advice given to you by your health care provider. Make sure you discuss any questions you have with your health care provider. Document   Revised: 10/21/2019 Document Reviewed: 10/21/2019 Elsevier Patient Education  [employer].

## 2020-07-24 NOTE — MAU Note (Signed)
PT ARRIVED VIA EMS -SAYS FEELS UC'S- STARTED ON/ OFF ALL DAY . [facility] WITH CLINIC .

## 2020-07-24 NOTE — MAU Note (Signed)
TOOK TYLENOL AT 6 PM- XS 2 TAB- NO RELIEF

## 2020-07-24 NOTE — MAU Provider Note (Signed)
History     CSN: [id number]  Arrival date and time: 07/24/20 2979   Event Date/Time   First Provider Initiated Contact with Patient 07/24/20 0310      Chief Complaint  Patient presents with  . Abdominal Pain   Heidi Martin is a 20 y.o. G2P0010 at [redacted]w[redacted]d who receives care at [facility]  She presents today for Abdominal Pain.  She states she has been experiencing abdominal cramping for the past few days.  She states she the pain is located "at the top of my stomach" and rates it a 8/10.  She reports taking tylenol with no relief of her symptoms and states the pain has worsened tonight.  Patient reports she only ate a hamburger earlier today and has been sleeping due to the pain.  She denies vaginal discharge or bleeding. She also denies sexual activity in the past 3 days.  Patient also expresses concern with decreased fetal movement today and reports that she was experiencing movement for the past 2 weeks.     OB History    Gravida  2   Para      Term      Preterm      AB  1   Living        SAB  1   IAB      Ectopic      Multiple      Live Births              Past Medical History:  Diagnosis Date  . COVID-19   . Irritable bowel syndrome (IBS)   . Sickle cell trait [facility])     Past Surgical History:  Procedure Laterality Date  . INDUCED ABORTION    . NO PAST SURGERIES      History reviewed. No pertinent family history.  Social History   Tobacco Use  . Smoking status: Never Smoker  . Smokeless tobacco: Never Used  Vaping Use  . Vaping Use: Never used  Substance Use Topics  . Alcohol use: No  . Drug use: No    Allergies:  Allergies  Allergen Reactions  . Milk [Lac Bovis] Nausea And Vomiting    Stomach cramping    Medications Prior to Admission  Medication Sig Dispense Refill Last Dose  . acetaminophen (TYLENOL) 500 MG tablet Take 2 tablets (1,000 mg total) by mouth every 8 (eight) hours as needed. 30 tablet 0 07/23/2020 at Unknown time  .  ondansetron (ZOFRAN ODT) 8 MG disintegrating tablet Take 1 tablet (8 mg total) by mouth every 8 (eight) hours as needed for nausea or vomiting. 20 tablet 0 Past Month at Unknown time  . polyethylene glycol (MIRALAX / GLYCOLAX) 17 g packet Take 17 g by mouth daily as needed for mild constipation or moderate constipation. 14 each 1 Past Week at Unknown time  . prenatal vitamin w/FE, FA (PRENATAL 1 + 1) 27-1 MG TABS tablet Take 1 tablet by mouth daily at 12 noon.   07/23/2020 at Unknown time  . miconazole (MICOTIN) 200 MG vaginal suppository Place 1 suppository (200 mg total) vaginally at bedtime. (Patient not taking: Reported on 07/21/2020) 3 suppository 0   . progesterone (PROMETRIUM) 200 MG capsule Place 1 capsule (200 mg total) vaginally at bedtime. 30 capsule 6     Review of Systems  Constitutional: Negative for chills and fever.  Respiratory: Negative for cough and shortness of breath.   Gastrointestinal: Positive for abdominal pain and constipation (BM this AM-Hard to pass.).  Negative for diarrhea, nausea and vomiting.  Genitourinary: Negative for difficulty urinating, dysuria, vaginal bleeding and vaginal discharge.  Musculoskeletal: Positive for back pain (Lower; 7/10).  Neurological: Positive for headaches (None currently). Negative for dizziness and light-headedness.   Physical Exam   Blood pressure 104/63, pulse 75, temperature 99 F (37.2 C), temperature source Oral, resp. rate 20, height 5\' 5"  (1.651 m), weight 68.9 kg, last menstrual period 03/09/2020, unknown if currently breastfeeding.  Physical Exam Vitals reviewed. Exam conducted with a chaperone present.  Constitutional:      Appearance: She is well-developed.  HENT:     Head: Normocephalic and atraumatic.  Cardiovascular:     Rate and Rhythm: Normal rate and regular rhythm.  Pulmonary:     Effort: Pulmonary effort is normal. No respiratory distress.  Abdominal:     General: Bowel sounds are normal.     Palpations:  Abdomen is soft.     Tenderness: There is abdominal tenderness in the right upper quadrant and epigastric area.  Genitourinary:    Vagina: Vaginal discharge present. No tenderness or bleeding.     Uterus: Normal.      Adnexa: Right adnexa normal.     Comments: Speculum Exam: NEFG: Mucoid discharge noted at introitus. Vault: Moderate amt milky white mucoid discharge. Wet prep collected Cervix: Appears closed. Clear mucoid discharge noted. BME: Cervix short ~50%. Closed. No tenderness.  Skin:    General: Skin is warm and dry.  Neurological:     Mental Status: She is alert.     MAU Course  Procedures Results for orders placed or performed during the hospital encounter of 07/24/20 (from the past 24 hour(s))  Urinalysis, Routine w reflex microscopic Urine, Clean Catch     Status: Abnormal   Collection Time: 07/24/20  3:13 AM  Result Value Ref Range   Color, Urine YELLOW YELLOW   APPearance HAZY (A) CLEAR   Specific Gravity, Urine 1.020 1.005 - 1.030   pH 6.0 5.0 - 8.0   Glucose, UA NEGATIVE NEGATIVE mg/dL   Hgb urine dipstick NEGATIVE NEGATIVE   Bilirubin Urine NEGATIVE NEGATIVE   Ketones, ur NEGATIVE NEGATIVE mg/dL   Protein, ur NEGATIVE NEGATIVE mg/dL   Nitrite NEGATIVE NEGATIVE   Leukocytes,Ua TRACE (A) NEGATIVE   RBC / HPF 0-5 0 - 5 RBC/hpf   WBC, UA 0-5 0 - 5 WBC/hpf   Bacteria, UA NONE SEEN NONE SEEN   Squamous Epithelial / LPF 11-20 0 - 5   Mucus PRESENT   CBC with Differential/Platelet     Status: Abnormal   Collection Time: 07/24/20  3:25 AM  Result Value Ref Range   WBC 9.3 4.0 - 10.5 K/uL   RBC 3.88 3.87 - 5.11 MIL/uL   Hemoglobin 10.2 (L) 12.0 - 15.0 g/dL   HCT 40.131.9 (L) 02.736.0 - 25.346.0 %   MCV 82.2 80.0 - 100.0 fL   MCH 26.3 26.0 - 34.0 pg   MCHC 32.0 30.0 - 36.0 g/dL   RDW 66.414.6 40.311.5 - 47.415.5 %   Platelets 231 150 - 400 K/uL   nRBC 0.0 0.0 - 0.2 %   Neutrophils Relative % 69 %   Neutro Abs 6.5 1.7 - 7.7 K/uL   Lymphocytes Relative 20 %   Lymphs Abs 1.8 0.7 -  4.0 K/uL   Monocytes Relative 10 %   Monocytes Absolute 0.9 0.1 - 1.0 K/uL   Eosinophils Relative 1 %   Eosinophils Absolute 0.1 0.0 - 0.5 K/uL   Basophils Relative 0 %  Basophils Absolute 0.0 0.0 - 0.1 K/uL   Immature Granulocytes 0 %   Abs Immature Granulocytes 0.04 0.00 - 0.07 K/uL  Comprehensive metabolic panel     Status: Abnormal   Collection Time: 07/24/20  3:25 AM  Result Value Ref Range   Sodium 136 135 - 145 mmol/L   Potassium 4.4 3.5 - 5.1 mmol/L   Chloride 108 98 - 111 mmol/L   CO2 17 (L) 22 - 32 mmol/L   Glucose, Bld 86 70 - 99 mg/dL   BUN 6 6 - 20 mg/dL   Creatinine, Ser 5.63 0.44 - 1.00 mg/dL   Calcium 8.4 (L) 8.9 - 10.3 mg/dL   Total Protein 5.8 (L) 6.5 - 8.1 g/dL   Albumin 2.9 (L) 3.5 - 5.0 g/dL   AST 22 15 - 41 U/L   ALT 8 0 - 44 U/L   Alkaline Phosphatase 43 38 - 126 U/L   Total Bilirubin 0.3 0.3 - 1.2 mg/dL   GFR, Estimated >87 >56 mL/min   Anion gap 11 5 - 15  Lipase, blood     Status: None   Collection Time: 07/24/20  3:25 AM  Result Value Ref Range   Lipase 28 11 - 51 U/L  Wet prep, genital     Status: Abnormal   Collection Time: 07/24/20  3:26 AM  Result Value Ref Range   Yeast Wet Prep HPF POC NONE SEEN NONE SEEN   Trich, Wet Prep NONE SEEN NONE SEEN   Clue Cells Wet Prep HPF POC NONE SEEN NONE SEEN   WBC, Wet Prep HPF POC MANY (A) NONE SEEN   Sperm NONE SEEN    US Abdomen Limited  Result Date: 07/24/2020 CLINICAL DATA:  Initial evaluation for acute right upper quadrant abdominal pain, currently pregnant. EXAM: ULTRASOUND ABDOMEN LIMITED RIGHT UPPER QUADRANT COMPARISON:  Prior CT from 02/14/2020 FINDINGS: Gallbladder: No gallstones or wall thickening visualized. No sonographic Murphy sign noted by sonographer. Common bile duct: Diameter: 4 mm Liver: No focal lesion identified. Within normal limits in parenchymal echogenicity. Portal vein is patent on color Doppler imaging with normal direction of blood flow towards the liver. Other: None.  IMPRESSION: Normal right upper quadrant ultrasound. Electronically Signed   By: Rise Mu M.D.   On: 07/24/2020 04:15     MDM Physical Exam Labs: Wet prep, CBC/D, CMP, Lipase GI Cocktail Abdominal US Assessment and Plan  20 year old, G2P0010 SIUP at 19.4 weeks Abdominal Cramping Short Cervix-Known  -Exam performed and findings discussed. -Patient informed that pain may be related to lack of nutritional intake today. -Discussed POC to include: *GI Cocktail *Pain Medication *Abdominal US *Vaginal Progesterone *Labs -Patient encouraged to notify staff if she desires something to eat. -Will await results.   Cherre Robins 07/24/2020, 3:11 AM   Reassessment (4:06 AM) -Pharmacy called, by nurse, regarding progesterone. -Pharmacist states no longer offered in hospital. -Patient informed that progesterone will have to be given at home.  -Will send for [place] and await results   Reassessment (4:39 AM) -[place] returns without significant findings. -Patient reports pain with no improvement with GI cocktail -Will give tylenol and flexeril. -Labs pending  Reassessment (5:37 AM) -Labs pending. -Patient reports no distinguishable improvement in pain. -Patient was able to eat and had no worsening of symptoms.  -Patient instructed to monitor symptoms and contact office or return to MAU for worsening. -Patient given option to leave or await lab results and desires discharge. -While putting in discharge, labs return and  normal range with exception of anemia noted. -Rx for iron supplement sent to pharmacy on file.  -Discharged to home in stable condition.  Cherre Robins MSN, CNM Advanced Practice Provider, [facility] [employer]

## 2020-08-02 ENCOUNTER — Encounter: Payer: Self-pay | Admitting: General Practice

## 2020-08-02 ENCOUNTER — Telehealth: Payer: Self-pay | Admitting: General Practice

## 2020-08-02 NOTE — Telephone Encounter (Signed)
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 Called patient, no answer- left message to call us back concerning test results.

## 2020-08-03 NOTE — Telephone Encounter (Signed)
Left message that we are trying to reach her for results and f/u.  If she could please contact the office or respond to her MyChart message.  MyChart message sent.    Addison Naegeli, RN  08/03/20

## 2020-08-04 ENCOUNTER — Ambulatory Visit: Payer: Medicaid Other

## 2020-08-05 ENCOUNTER — Telehealth: Payer: Self-pay | Admitting: General Practice

## 2020-08-05 ENCOUNTER — Ambulatory Visit: Payer: Medicaid Other | Attending: Obstetrics

## 2020-08-05 ENCOUNTER — Encounter: Payer: Self-pay | Admitting: *Deleted

## 2020-08-05 ENCOUNTER — Ambulatory Visit: Payer: Medicaid Other | Admitting: *Deleted

## 2020-08-05 ENCOUNTER — Other Ambulatory Visit: Payer: Self-pay

## 2020-08-05 DIAGNOSIS — Z3480 Encounter for supervision of other normal pregnancy, unspecified trimester: Secondary | ICD-10-CM | POA: Diagnosis present

## 2020-08-05 DIAGNOSIS — Z3686 Encounter for antenatal screening for cervical length: Secondary | ICD-10-CM | POA: Diagnosis not present

## 2020-08-05 NOTE — Telephone Encounter (Signed)
   AMBRIA MAYFIELD DOB: 04-28-2001 MRN: [id number]   RIDER WAIVER AND RELEASE OF LIABILITY  For purposes of improving physical access to our facilities, [facility] is pleased to partner with third parties to provide [facility] patients or other authorized individuals the option of convenient, on-demand ground transportation services (the [employer]") through use of the technology service that enables users to request on-demand ground transportation from independent third-party providers.  By opting to use and accept these [employer], I, the undersigned, hereby agree on behalf of myself, and on behalf of any minor child using the [employer] for whom I am the parent or legal guardian, as follows:  1. Science writer provided to me are provided by independent third-party transportation providers who are not [employer] or employees and who are unaffiliated with [employer]. 2. [facility] is neither a transportation carrier nor a common or public carrier. 3. [facility] has no control over the quality or safety of the transportation that occurs as a result of the [employer]. 4. [facility] cannot guarantee that any third-party transportation provider will complete any arranged transportation service. 5. [facility] makes no representation, warranty, or guarantee regarding the reliability, timeliness, quality, safety, suitability, or availability of any of the Transport Services or that they will be error free. 6. I fully understand that traveling by vehicle involves risks and dangers of serious bodily injury, including permanent disability, paralysis, and death. I agree, on behalf of myself and on behalf of any minor child using the Transport Services for whom I am the parent or legal guardian, that the entire risk arising out of my use of the [employer] remains solely with me, to the maximum extent permitted under applicable law. 7. The [employer] are provided "as is" and "as available." [facility] disclaims all representations and warranties, express, implied or statutory, not expressly set out in these terms, including the implied warranties of merchantability and fitness for a particular purpose. 8. I hereby waive and release [place] [facility], its agents, employees, officers, directors, representatives, insurers, attorneys, assigns, successors, subsidiaries, and affiliates from any and all past, present, or future claims, demands, liabilities, actions, causes of action, or suits of any kind directly or indirectly arising from acceptance and use of the [employer]. 9. I further waive and release [facility] and its affiliates from all present and future liability and responsibility for any injury or death to persons or damages to property caused by or related to the use of the [employer]. 10. I have read this Waiver and Release of Liability, and I understand the terms used in it and their legal significance. This Waiver is freely and voluntarily given with the understanding that my right (as well as the right of any minor child for whom I am the parent or legal guardian using the [employer]) to legal recourse against [facility] in connection with the [employer] is knowingly surrendered in return for use of these services.   I attest that I read the consent document to [name], gave Ms. Lamica the opportunity to ask questions and answered the questions asked (if any). I affirm that [name] then provided consent for she's participation in this program.     Ephriam Knuckles Vilsaint

## 2020-08-18 ENCOUNTER — Ambulatory Visit: Payer: Medicaid Other | Admitting: *Deleted

## 2020-08-18 ENCOUNTER — Other Ambulatory Visit: Payer: Self-pay

## 2020-08-18 ENCOUNTER — Ambulatory Visit: Payer: Medicaid Other | Attending: Obstetrics and Gynecology

## 2020-08-18 ENCOUNTER — Encounter: Payer: Self-pay | Admitting: *Deleted

## 2020-08-18 ENCOUNTER — Other Ambulatory Visit: Payer: Self-pay | Admitting: *Deleted

## 2020-08-18 DIAGNOSIS — Z3A23 23 weeks gestation of pregnancy: Secondary | ICD-10-CM

## 2020-08-18 DIAGNOSIS — Z862 Personal history of diseases of the blood and blood-forming organs and certain disorders involving the immune mechanism: Secondary | ICD-10-CM | POA: Diagnosis not present

## 2020-08-18 DIAGNOSIS — O26872 Cervical shortening, second trimester: Secondary | ICD-10-CM

## 2020-08-18 DIAGNOSIS — Z3480 Encounter for supervision of other normal pregnancy, unspecified trimester: Secondary | ICD-10-CM | POA: Diagnosis present

## 2020-08-18 DIAGNOSIS — O99112 Other diseases of the blood and blood-forming organs and certain disorders involving the immune mechanism complicating pregnancy, second trimester: Secondary | ICD-10-CM

## 2020-08-18 DIAGNOSIS — Z3686 Encounter for antenatal screening for cervical length: Secondary | ICD-10-CM | POA: Insufficient documentation

## 2020-08-19 ENCOUNTER — Telehealth: Payer: Medicaid Other | Admitting: Student

## 2020-08-31 ENCOUNTER — Ambulatory Visit: Payer: Medicaid Other

## 2020-08-31 ENCOUNTER — Ambulatory Visit: Payer: Medicaid Other | Attending: Obstetrics

## 2020-09-08 ENCOUNTER — Ambulatory Visit: Payer: Self-pay | Admitting: *Deleted

## 2020-09-08 NOTE — Telephone Encounter (Signed)
Pt called with complaints of chest pain and headache started 09/06/20; she is also experiencing shaking and body aches x 1 week; her heading and body aches are the worst of her symptoms; she describes her headache as throbbing and rated 10 out of 10;  Her headache is constant; she has taken Tylenol; the pt is also [redacted] weeks pregnant; recommendations made per nurse triage protocol; she verbalized understanding and will go the ED.  Reason for Disposition . Patient sounds very sick or weak to the triager  Answer Assessment - Initial Assessment Questions 1. LOCATION: "Where does it hurt?"      Entire head 2. ONSET: "When did the headache start?" (Minutes, hours or days)      09/06/20 3. PATTERN: "Does the pain come and go, or has it been constant since it started?"     constant 4. SEVERITY: "How bad is the pain?" and "What does it keep you from doing?"  (e.g., Scale 1-10; mild, moderate, or severe)   - MILD (1-3): doesn't interfere with normal activities    - MODERATE (4-7): interferes with normal activities or awakens from sleep    - SEVERE (8-10): excruciating pain, unable to do any normal activities        10 out of 10 5. RECURRENT SYMPTOM: "Have you ever had headaches before?" If Yes, ask: "When was the last time?" and "What happened that time?"      no 6. CAUSE: "What do you think is causing the headache?"     Not sure 7. MIGRAINE: "Have you been diagnosed with migraine headaches?" If Yes, ask: "Is this headache similar?"     Yes, not like migraine 8. HEAD INJURY: "Has there been any recent injury to the head?"      no 9. OTHER SYMPTOMS: "Do you have any other symptoms?" (fever, stiff neck, eye pain, sore throat, cold symptoms)   congestion 10. PREGNANCY: "Is there any chance you are pregnant?" "When was your last menstrual period?"     Yes 26 weeks 1 day  Protocols used: HEADACHE-A-AH

## 2020-09-09 ENCOUNTER — Encounter: Payer: Medicaid Other | Admitting: Student

## 2020-09-13 ENCOUNTER — Other Ambulatory Visit: Payer: Self-pay

## 2020-09-13 ENCOUNTER — Inpatient Hospital Stay (HOSPITAL_COMMUNITY)
Admission: AD | Admit: 2020-09-13 | Discharge: 2020-09-13 | Disposition: A | Discharge status: Home / Self Care | Payer: Medicaid Other | Attending: Obstetrics & Gynecology | Admitting: Obstetrics & Gynecology

## 2020-09-13 ENCOUNTER — Encounter (HOSPITAL_COMMUNITY): Payer: Self-pay | Admitting: Obstetrics & Gynecology

## 2020-09-13 DIAGNOSIS — O99891 Other specified diseases and conditions complicating pregnancy: Secondary | ICD-10-CM

## 2020-09-13 DIAGNOSIS — R519 Headache, unspecified: Secondary | ICD-10-CM

## 2020-09-13 DIAGNOSIS — O26892 Other specified pregnancy related conditions, second trimester: Secondary | ICD-10-CM | POA: Diagnosis present

## 2020-09-13 DIAGNOSIS — U099 Post covid-19 condition, unspecified: Secondary | ICD-10-CM | POA: Insufficient documentation

## 2020-09-13 DIAGNOSIS — Z3A26 26 weeks gestation of pregnancy: Secondary | ICD-10-CM

## 2020-09-13 DIAGNOSIS — K589 Irritable bowel syndrome without diarrhea: Secondary | ICD-10-CM | POA: Diagnosis not present

## 2020-09-13 DIAGNOSIS — O99612 Diseases of the digestive system complicating pregnancy, second trimester: Secondary | ICD-10-CM | POA: Diagnosis not present

## 2020-09-13 DIAGNOSIS — Z3A29 29 weeks gestation of pregnancy: Secondary | ICD-10-CM | POA: Diagnosis not present

## 2020-09-13 DIAGNOSIS — Z20822 Contact with and (suspected) exposure to covid-19: Secondary | ICD-10-CM | POA: Insufficient documentation

## 2020-09-13 DIAGNOSIS — G8929 Other chronic pain: Secondary | ICD-10-CM

## 2020-09-13 LAB — URINALYSIS, ROUTINE W REFLEX MICROSCOPIC
Bacteria, UA: NONE SEEN
Bilirubin Urine: NEGATIVE
Glucose, UA: NEGATIVE mg/dL
Hgb urine dipstick: NEGATIVE
Ketones, ur: NEGATIVE mg/dL
Nitrite: NEGATIVE
Protein, ur: 30 mg/dL — AB
Specific Gravity, Urine: 1.024 (ref 1.005–1.030)
pH: 5 (ref 5.0–8.0)

## 2020-09-13 LAB — RESP PANEL BY RT-PCR (FLU A&B, COVID) ARPGX2
Influenza A by PCR: NEGATIVE
Influenza B by PCR: NEGATIVE
SARS Coronavirus 2 by RT PCR: NEGATIVE

## 2020-09-13 MED ORDER — PREDNISONE 10 MG (21) PO TBPK: ORAL_TABLET | Freq: Every day | ORAL | 0 refills | Status: DC

## 2020-09-13 MED ORDER — CYCLOBENZAPRINE HCL 5 MG PO TABS
10.0000 mg | ORAL_TABLET | Freq: Once | ORAL | Status: AC
  Administered 2020-09-13: 10 mg via ORAL
  Filled 2020-09-13: qty 2

## 2020-09-13 NOTE — Discharge Instructions (Signed)

## 2020-09-13 NOTE — MAU Note (Signed)
Patient reports to MAU reporting a headache for 4 days and body aches for 2 days. She states her symptoms got worse today, she took tylenol and did not have relief. She states he little sister has been sick but has not been tested for COVID.  Denies contractions, vaginal bleeding or LOF. +FM Pain score: headache 9/10. Body aches 7/10

## 2020-09-13 NOTE — MAU Provider Note (Signed)
History     CSN: [id number]  Arrival date and time: 09/13/20 0134   Event Date/Time   First Provider Initiated Contact with Patient 09/13/20 0221      Chief Complaint  Patient presents with  . Headache  . Generalized Body Aches   HPI Heidi Martin is a 20 y.o. G2P0010 at [redacted]w[redacted]d who presents with headache, body aches, cough and congestion. She states her sister has the same symptoms. She reports the headache has been ongoing since she got COVID in December but the other symptoms started 2 days ago. She is concerned she may have the flu. She took tylenol with no relief. She denies any abdominal pain, vaginal bleeding or discharge. Reports normal fetal movement.   OB History    Gravida  2   Para      Term      Preterm      AB  1   Living        SAB  1   IAB      Ectopic      Multiple      Live Births              Past Medical History:  Diagnosis Date  . COVID-19   . Irritable bowel syndrome (IBS)   . Sickle cell trait [facility])     Past Surgical History:  Procedure Laterality Date  . INDUCED ABORTION    . NO PAST SURGERIES      Family History  Problem Relation Age of Onset  . Cancer Mother   . Depression Mother   . Miscarriages / [place] Mother   . Hypertension Mother   . Cancer Maternal Grandmother   . Diabetes Maternal Grandmother   . Miscarriages / Stillbirths Maternal Grandmother     Social History   Tobacco Use  . Smoking status: Never Smoker  . Smokeless tobacco: Never Used  Vaping Use  . Vaping Use: Never used  Substance Use Topics  . Alcohol use: No  . Drug use: No    Allergies:  Allergies  Allergen Reactions  . Milk [Lac Bovis] Nausea And Vomiting    Stomach cramping    Medications Prior to Admission  Medication Sig Dispense Refill Last Dose  . acetaminophen (TYLENOL) 500 MG tablet Take 2 tablets (1,000 mg total) by mouth every 8 (eight) hours as needed. 30 tablet 0 09/12/2020 at Unknown time  . ferrous sulfate 325 (65  FE) MG EC tablet Take 1 tablet (325 mg total) by mouth every other day. (Patient not taking: Reported on 08/05/2020) 45 tablet 1   . miconazole (MICOTIN) 200 MG vaginal suppository Place 1 suppository (200 mg total) vaginally at bedtime. (Patient not taking: Reported on 07/21/2020) 3 suppository 0   . ondansetron (ZOFRAN ODT) 8 MG disintegrating tablet Take 1 tablet (8 mg total) by mouth every 8 (eight) hours as needed for nausea or vomiting. 20 tablet 0   . polyethylene glycol (MIRALAX / GLYCOLAX) 17 g packet Take 17 g by mouth daily as needed for mild constipation or moderate constipation. 14 each 1   . prenatal vitamin w/FE, FA (PRENATAL 1 + 1) 27-1 MG TABS tablet Take 1 tablet by mouth daily at 12 noon.     . progesterone (PROMETRIUM) 200 MG capsule Place 1 capsule (200 mg total) vaginally at bedtime. 30 capsule 6     Review of Systems  Constitutional: Negative.  Negative for fatigue and fever.  HENT: Negative.   Respiratory: Negative.  Negative for shortness of breath.   Cardiovascular: Negative.  Negative for chest pain.  Gastrointestinal: Negative.  Negative for abdominal pain, constipation, diarrhea, nausea and vomiting.  Genitourinary: Negative.  Negative for dysuria, vaginal bleeding and vaginal discharge.  Musculoskeletal: Positive for myalgias.  Neurological: Positive for headaches. Negative for dizziness.   Physical Exam   Temperature 98.1 F (36.7 C), temperature source Oral, resp. rate 20, last menstrual period 03/09/2020, unknown if currently breastfeeding.  Physical Exam Vitals and nursing note reviewed.  Constitutional:      General: She is not in acute distress.    Appearance: She is well-developed.  HENT:     Head: Normocephalic.  Eyes:     Pupils: Pupils are equal, round, and reactive to light.  Cardiovascular:     Rate and Rhythm: Normal rate and regular rhythm.     Heart sounds: Normal heart sounds.  Pulmonary:     Effort: Pulmonary effort is normal. No  respiratory distress.     Breath sounds: Normal breath sounds.  Abdominal:     General: Bowel sounds are normal. There is no distension.     Palpations: Abdomen is soft.     Tenderness: There is no abdominal tenderness.  Skin:    General: Skin is warm and dry.  Neurological:     Mental Status: She is alert and oriented to person, place, and time.  Psychiatric:        Behavior: Behavior normal.        Thought Content: Thought content normal.        Judgment: Judgment normal.    Fetal Tracing:  Baseline: 140 Variability: moderate Accels: 15x15 Decels: none  Toco: none  MAU Course  Procedures Results for orders placed or performed during the hospital encounter of 09/13/20 (from the past 24 hour(s))  Resp Panel by RT-PCR (Flu A&B, Covid)     Status: None   Collection Time: 09/13/20  2:43 AM   Specimen: Nasopharyngeal(NP) swabs in vial transport medium  Result Value Ref Range   SARS Coronavirus 2 by RT PCR NEGATIVE NEGATIVE   Influenza A by PCR NEGATIVE NEGATIVE   Influenza B by PCR NEGATIVE NEGATIVE  Urinalysis, Routine w reflex microscopic     Status: Abnormal   Collection Time: 09/13/20  3:31 AM  Result Value Ref Range   Color, Urine YELLOW YELLOW   APPearance HAZY (A) CLEAR   Specific Gravity, Urine 1.024 1.005 - 1.030   pH 5.0 5.0 - 8.0   Glucose, UA NEGATIVE NEGATIVE mg/dL   Hgb urine dipstick NEGATIVE NEGATIVE   Bilirubin Urine NEGATIVE NEGATIVE   Ketones, ur NEGATIVE NEGATIVE mg/dL   Protein, ur 30 (A) NEGATIVE mg/dL   Nitrite NEGATIVE NEGATIVE   Leukocytes,Ua MODERATE (A) NEGATIVE   RBC / HPF 0-5 0 - 5 RBC/hpf   WBC, UA 6-10 0 - 5 WBC/hpf   Bacteria, UA NONE SEEN NONE SEEN   Squamous Epithelial / LPF 6-10 0 - 5   Mucus PRESENT    Hyaline Casts, UA PRESENT    Ca Oxalate Crys, UA PRESENT    MDM UA Resp panel Flexeril- reports resolution of body aches and some improvement of HA  Discussed headache is likely post COVID and reviewed recommendations at  length. Reviewed importance of proper PO hydration and nutrition in pregnancy as well.   Assessment and Plan   1. Pregnancy headache in second trimester   2. [redacted] weeks gestation of pregnancy   3. Post-COVID chronic headache    -  Discharge home in stable condition -Rx for steroid taper sent to pharmacy -Preterm labor precautions discussed -Patient advised to follow-up with OB as scheduled for prenatal care -Patient may return to MAU as needed or if her condition were to change or worsen   Rolm Bookbinder CNM 09/13/2020, 2:21 AM

## 2020-09-14 LAB — CULTURE, OB URINE: Culture: NO GROWTH

## 2020-09-15 ENCOUNTER — Ambulatory Visit: Payer: Medicaid Other

## 2020-09-20 ENCOUNTER — Encounter: Payer: Self-pay | Admitting: *Deleted

## 2020-09-23 ENCOUNTER — Other Ambulatory Visit: Payer: Self-pay

## 2020-09-23 ENCOUNTER — Other Ambulatory Visit: Payer: Self-pay | Admitting: *Deleted

## 2020-09-23 ENCOUNTER — Ambulatory Visit: Payer: Medicaid Other | Admitting: *Deleted

## 2020-09-23 ENCOUNTER — Ambulatory Visit: Payer: Medicaid Other | Attending: Obstetrics

## 2020-09-23 ENCOUNTER — Encounter: Payer: Self-pay | Admitting: *Deleted

## 2020-09-23 DIAGNOSIS — O26872 Cervical shortening, second trimester: Secondary | ICD-10-CM | POA: Insufficient documentation

## 2020-09-23 DIAGNOSIS — O26873 Cervical shortening, third trimester: Secondary | ICD-10-CM

## 2020-09-23 DIAGNOSIS — O99013 Anemia complicating pregnancy, third trimester: Secondary | ICD-10-CM | POA: Diagnosis not present

## 2020-09-23 DIAGNOSIS — Z3480 Encounter for supervision of other normal pregnancy, unspecified trimester: Secondary | ICD-10-CM | POA: Diagnosis present

## 2020-09-23 DIAGNOSIS — Z362 Encounter for other antenatal screening follow-up: Secondary | ICD-10-CM

## 2020-09-23 DIAGNOSIS — O365930 Maternal care for other known or suspected poor fetal growth, third trimester, not applicable or unspecified: Secondary | ICD-10-CM

## 2020-09-23 DIAGNOSIS — D573 Sickle-cell trait: Secondary | ICD-10-CM | POA: Diagnosis not present

## 2020-09-23 DIAGNOSIS — Z3A28 28 weeks gestation of pregnancy: Secondary | ICD-10-CM

## 2020-10-07 ENCOUNTER — Other Ambulatory Visit: Payer: Self-pay

## 2020-10-07 ENCOUNTER — Ambulatory Visit (INDEPENDENT_AMBULATORY_CARE_PROVIDER_SITE_OTHER): Payer: Medicaid Other | Admitting: Student

## 2020-10-07 ENCOUNTER — Other Ambulatory Visit (HOSPITAL_COMMUNITY)
Admission: RE | Admit: 2020-10-07 | Discharge: 2020-10-07 | Disposition: A | Discharge status: Home / Self Care | Payer: Medicaid Other | Source: Ambulatory Visit | Attending: Student | Admitting: Student

## 2020-10-07 ENCOUNTER — Ambulatory Visit: Payer: Self-pay | Admitting: Clinical

## 2020-10-07 VITALS — BP 115/72 | HR 96 | Wt 160.0 lb

## 2020-10-07 DIAGNOSIS — O26892 Other specified pregnancy related conditions, second trimester: Secondary | ICD-10-CM

## 2020-10-07 DIAGNOSIS — Z3480 Encounter for supervision of other normal pregnancy, unspecified trimester: Secondary | ICD-10-CM | POA: Diagnosis present

## 2020-10-07 DIAGNOSIS — Z1331 Encounter for screening for depression: Secondary | ICD-10-CM

## 2020-10-07 DIAGNOSIS — Z3A30 30 weeks gestation of pregnancy: Secondary | ICD-10-CM

## 2020-10-07 DIAGNOSIS — R519 Headache, unspecified: Secondary | ICD-10-CM

## 2020-10-07 NOTE — Progress Notes (Signed)
Patient ID: Heidi Martin, female   DOB: [birth year], 20 y.o.   MRN: [id number]   PRENATAL VISIT NOTE  Subjective:  Heidi Martin is a 20 y.o. G2P0010 at [redacted]w[redacted]d being seen today for ongoing prenatal care.  She is currently monitored for the following issues for this high-risk pregnancy and has Supervision of other normal pregnancy, antepartum; History of therapeutic abortion; Positive GBS test; Sickle cell trait (HCC); and Short cervix on their problem list.  Patient reports vaginal irritation.  She reports two days of yellow discharge; it has an odor that she can't explain. She also reports lots of worrying about the size of her baby and trying to eat more and more to help baby grow.  Contractions: Irritability. Vag. Bleeding: None.  Movement: Present. Denies leaking of fluid.   The following portions of the patient's history were reviewed and updated as appropriate: allergies, current medications, past family history, past medical history, past social history, past surgical history and problem list.   Objective:   Vitals:   10/07/20 1046  BP: 115/72  Pulse: 96  Weight: 160 lb (72.6 kg)    Fetal Status: Fetal Heart Rate (bpm): 146 Fundal Height: 29 cm Movement: Present     General:  Alert, oriented and cooperative. Patient is in no acute distress.  Skin: Skin is warm and dry. No rash noted.   Cardiovascular: Normal heart rate noted  Respiratory: Normal respiratory effort, no problems with respiration noted  Abdomen: Soft, gravid, appropriate for gestational age.  Pain/Pressure: Present     Pelvic: Cervical exam deferred        Extremities: Normal range of motion.  Edema: None  Mental Status: Normal mood and affect. Normal behavior. Normal judgment and thought content.   Assessment and Plan:  Pregnancy: G2P0010 at [redacted]w[redacted]d 1. Pregnancy headache in second trimester -will do swab today to check for infection, will treat as necessary -[place] reviewed, patient had questions about further  testing and knows she may need to have weekly testing/early delivery if infant is in distress -Support and therapeutic listening provided -Patient will meet with Asher Muir next week -Patient did not have 2 hour GTT so will have appt tomorrow; reviewed need for fasting, 2 hour process.   2. Supervision of other normal pregnancy, antepartum  - Ambulatory referral to Integrated Behavioral Health  Preterm labor symptoms and general obstetric precautions including but not limited to vaginal bleeding, contractions, leaking of fluid and fetal movement were reviewed in detail with the patient. Please refer to After Visit Summary for other counseling recommendations.   Return in about 1 day (around 10/08/2020), or 2 hour gtt and 28 week labs only and then LROB with KK in two weeks.  Future Appointments  Date Time Provider Department Center  10/08/2020  8:50 AM [facility]  10/12/2020  1:15 PM [facility] HEALTH CLINICIAN [facility]  10/13/2020  3:45 PM [facility] NURSE [facility]  10/13/2020  4:00 PM [facility] US1 [facility]  10/25/2020 10:35 AM Burleson, Brand Males, NP [facility]    Marylene Land, CNM

## 2020-10-07 NOTE — BH Specialist Note (Signed)
Integrated Behavioral Health Initial In-Person Visit  MRN: [id number] Name: Heidi Martin  Number of Integrated Behavioral Health Clinician visits:: 1/6 Session Start time: 11:05 Session End time: 11:13 Total time: 8 minutes  Types of Service: Introduction only; Pt agrees to virtual consult on 10/12/20 at 1:15pm for coping strategies after positive PHQ9/GAD7  Interpretor:No. Interpretor Name and Language: n/a   Warm Hand Off Completed.       Rae Lips, LCSW   Depression screen [facility] 2/9 10/07/2020 07/21/2020  Decreased Interest 2 2  Down, Depressed, Hopeless 2 1  PHQ - 2 Score 4 3  Altered sleeping 3 3  Tired, decreased energy 3 2  Change in appetite 3 1  Feeling bad or failure about yourself  3 1  Trouble concentrating 0 3  Moving slowly or fidgety/restless 3 0  Suicidal thoughts 0 0  PHQ-9 Score 19 13   GAD 7 : Generalized Anxiety Score 10/07/2020 07/21/2020  Nervous, Anxious, on Edge 2 2  Control/stop worrying 2 1  Worry too much - different things 2 1  Trouble relaxing 3 3  Restless 2 1  Easily annoyed or irritable 3 2  Afraid - awful might happen 1 1  Total GAD 7 Score 15 11

## 2020-10-07 NOTE — Progress Notes (Signed)
Pt states having yellowish vaginal discharge. Will do self swab today.

## 2020-10-08 ENCOUNTER — Other Ambulatory Visit: Payer: Medicaid Other

## 2020-10-08 DIAGNOSIS — Z3480 Encounter for supervision of other normal pregnancy, unspecified trimester: Secondary | ICD-10-CM

## 2020-10-08 LAB — CERVICOVAGINAL ANCILLARY ONLY
Bacterial Vaginitis (gardnerella): POSITIVE — AB
Candida Glabrata: NEGATIVE
Candida Vaginitis: NEGATIVE
Chlamydia: NEGATIVE
Comment: NEGATIVE
Comment: NEGATIVE
Comment: NEGATIVE
Comment: NEGATIVE
Comment: NEGATIVE
Comment: NORMAL
Neisseria Gonorrhea: NEGATIVE
Trichomonas: NEGATIVE

## 2020-10-08 NOTE — BH Specialist Note (Signed)
Pt did not arrive to video visit and did not answer the phone ; Left HIPPA-compliant message to call back Krupa Stege from [facility] at [phone] (main office) or [phone] (Krist Rosenboom's office).  ; left MyChart message for patient.      

## 2020-10-09 LAB — HIV ANTIBODY (ROUTINE TESTING W REFLEX): HIV Screen 4th Generation wRfx: NONREACTIVE

## 2020-10-09 LAB — CBC
Hematocrit: 35.1 % (ref 34.0–46.6)
Hemoglobin: 11.3 g/dL (ref 11.1–15.9)
MCH: 26.5 pg — ABNORMAL LOW (ref 26.6–33.0)
MCHC: 32.2 g/dL (ref 31.5–35.7)
MCV: 82 fL (ref 79–97)
Platelets: 257 10*3/uL (ref 150–450)
RBC: 4.26 x10E6/uL (ref 3.77–5.28)
RDW: 14.2 % (ref 11.7–15.4)
WBC: 15.3 10*3/uL — ABNORMAL HIGH (ref 3.4–10.8)

## 2020-10-09 LAB — GLUCOSE TOLERANCE, 2 HOURS W/ 1HR
Glucose, 1 hour: 81 mg/dL (ref 65–179)
Glucose, 2 hour: 78 mg/dL (ref 65–152)
Glucose, Fasting: 72 mg/dL (ref 65–91)

## 2020-10-09 LAB — RPR: RPR Ser Ql: NONREACTIVE

## 2020-10-10 ENCOUNTER — Inpatient Hospital Stay (HOSPITAL_COMMUNITY)
Admission: AD | Admit: 2020-10-10 | Discharge: 2020-10-12 | DRG: 832 | Disposition: A | Discharge status: Home / Self Care | Payer: Medicaid Other | Attending: Family Medicine | Admitting: Family Medicine

## 2020-10-10 ENCOUNTER — Encounter (HOSPITAL_COMMUNITY): Payer: Self-pay | Admitting: Obstetrics and Gynecology

## 2020-10-10 DIAGNOSIS — O365930 Maternal care for other known or suspected poor fetal growth, third trimester, not applicable or unspecified: Secondary | ICD-10-CM | POA: Diagnosis present

## 2020-10-10 DIAGNOSIS — O9902 Anemia complicating childbirth: Secondary | ICD-10-CM | POA: Diagnosis present

## 2020-10-10 DIAGNOSIS — Z3A30 30 weeks gestation of pregnancy: Secondary | ICD-10-CM

## 2020-10-10 DIAGNOSIS — Z20822 Contact with and (suspected) exposure to covid-19: Secondary | ICD-10-CM | POA: Diagnosis present

## 2020-10-10 DIAGNOSIS — D573 Sickle-cell trait: Secondary | ICD-10-CM | POA: Diagnosis present

## 2020-10-10 DIAGNOSIS — O26873 Cervical shortening, third trimester: Secondary | ICD-10-CM | POA: Diagnosis present

## 2020-10-10 DIAGNOSIS — Z3480 Encounter for supervision of other normal pregnancy, unspecified trimester: Secondary | ICD-10-CM

## 2020-10-10 DIAGNOSIS — Z880 Allergy status to penicillin: Secondary | ICD-10-CM

## 2020-10-10 DIAGNOSIS — O99820 Streptococcus B carrier state complicating pregnancy: Secondary | ICD-10-CM | POA: Diagnosis present

## 2020-10-10 DIAGNOSIS — Z3A31 31 weeks gestation of pregnancy: Secondary | ICD-10-CM | POA: Diagnosis not present

## 2020-10-10 LAB — CBC
HCT: 33.2 % — ABNORMAL LOW (ref 36.0–46.0)
Hemoglobin: 11.1 g/dL — ABNORMAL LOW (ref 12.0–15.0)
MCH: 27.2 pg (ref 26.0–34.0)
MCHC: 33.4 g/dL (ref 30.0–36.0)
MCV: 81.4 fL (ref 80.0–100.0)
Platelets: 271 10*3/uL (ref 150–400)
RBC: 4.08 MIL/uL (ref 3.87–5.11)
RDW: 13.7 % (ref 11.5–15.5)
WBC: 21.7 10*3/uL — ABNORMAL HIGH (ref 4.0–10.5)
nRBC: 0 % (ref 0.0–0.2)

## 2020-10-10 LAB — WET PREP, GENITAL
Sperm: NONE SEEN
Trich, Wet Prep: NONE SEEN
Yeast Wet Prep HPF POC: NONE SEEN

## 2020-10-10 LAB — RESP PANEL BY RT-PCR (FLU A&B, COVID) ARPGX2
Influenza A by PCR: NEGATIVE
Influenza B by PCR: NEGATIVE
SARS Coronavirus 2 by RT PCR: NEGATIVE

## 2020-10-10 LAB — GROUP B STREP BY PCR: Group B strep by PCR: POSITIVE — AB

## 2020-10-10 LAB — OB RESULTS CONSOLE GC/CHLAMYDIA: Gonorrhea: NEGATIVE

## 2020-10-10 LAB — TYPE AND SCREEN
ABO/RH(D): O POS
Antibody Screen: NEGATIVE

## 2020-10-10 MED ORDER — OXYTOCIN-SODIUM CHLORIDE 30-0.9 UT/500ML-% IV SOLN: 2.5000 [IU]/h | INTRAVENOUS | Status: DC

## 2020-10-10 MED ORDER — ONDANSETRON HCL 4 MG/2ML IJ SOLN
4.0000 mg | Freq: Four times a day (QID) | INTRAMUSCULAR | Status: DC | PRN
  Administered 2020-10-10 – 2020-10-11 (×2): 4 mg via INTRAVENOUS
  Filled 2020-10-10 (×2): qty 2

## 2020-10-10 MED ORDER — PHENYLEPHRINE 40 MCG/ML (10ML) SYRINGE FOR IV PUSH (FOR BLOOD PRESSURE SUPPORT): 80.0000 ug | PREFILLED_SYRINGE | INTRAVENOUS | Status: DC | PRN

## 2020-10-10 MED ORDER — SODIUM CHLORIDE 0.9 % IV SOLN
5.0000 10*6.[IU] | Freq: Once | INTRAVENOUS | Status: AC
  Administered 2020-10-10: 5 10*6.[IU] via INTRAVENOUS
  Filled 2020-10-10: qty 5

## 2020-10-10 MED ORDER — LIDOCAINE HCL (PF) 1 % IJ SOLN: 30.0000 mL | INTRAMUSCULAR | Status: DC | PRN

## 2020-10-10 MED ORDER — LACTATED RINGERS IV SOLN: 500.0000 mL | Freq: Once | INTRAVENOUS | Status: DC

## 2020-10-10 MED ORDER — HYDROXYZINE HCL 50 MG PO TABS
25.0000 mg | ORAL_TABLET | Freq: Once | ORAL | Status: AC
  Administered 2020-10-10: 25 mg via ORAL
  Filled 2020-10-10: qty 1

## 2020-10-10 MED ORDER — BETAMETHASONE SOD PHOS & ACET 6 (3-3) MG/ML IJ SUSP
12.0000 mg | Freq: Once | INTRAMUSCULAR | Status: AC
  Administered 2020-10-10: 12 mg via INTRAMUSCULAR
  Filled 2020-10-10: qty 5

## 2020-10-10 MED ORDER — OXYCODONE-ACETAMINOPHEN 5-325 MG PO TABS: 2.0000 | ORAL_TABLET | ORAL | Status: DC | PRN

## 2020-10-10 MED ORDER — ACETAMINOPHEN 325 MG PO TABS
650.0000 mg | ORAL_TABLET | ORAL | Status: DC | PRN
  Administered 2020-10-11: 650 mg via ORAL
  Filled 2020-10-10: qty 2

## 2020-10-10 MED ORDER — FLEET ENEMA 7-19 GM/118ML RE ENEM
1.0000 | ENEMA | RECTAL | Status: DC | PRN
Start: 2020-10-10 — End: 2020-10-11

## 2020-10-10 MED ORDER — OXYTOCIN BOLUS FROM INFUSION: 333.0000 mL | Freq: Once | INTRAVENOUS | Status: DC

## 2020-10-10 MED ORDER — NIFEDIPINE 10 MG PO CAPS
10.0000 mg | ORAL_CAPSULE | ORAL | Status: DC | PRN
  Administered 2020-10-10: 14:00:00 10 mg via ORAL
  Filled 2020-10-10 (×2): qty 1

## 2020-10-10 MED ORDER — MAGNESIUM SULFATE 40 GM/1000ML IV SOLN
2.0000 g/h | INTRAVENOUS | Status: AC
  Administered 2020-10-11: 2 g/h via INTRAVENOUS
  Filled 2020-10-10 (×2): qty 1000

## 2020-10-10 MED ORDER — FENTANYL CITRATE (PF) 100 MCG/2ML IJ SOLN
50.0000 ug | INTRAMUSCULAR | Status: DC | PRN
  Administered 2020-10-10 – 2020-10-11 (×3): 100 ug via INTRAVENOUS
  Filled 2020-10-10 (×3): qty 2

## 2020-10-10 MED ORDER — OXYCODONE-ACETAMINOPHEN 5-325 MG PO TABS: 1.0000 | ORAL_TABLET | ORAL | Status: DC | PRN

## 2020-10-10 MED ORDER — LACTATED RINGERS IV SOLN: 500.0000 mL | INTRAVENOUS | Status: DC | PRN

## 2020-10-10 MED ORDER — SOD CITRATE-CITRIC ACID 500-334 MG/5ML PO SOLN: 30.0000 mL | ORAL | Status: DC | PRN

## 2020-10-10 MED ORDER — LACTATED RINGERS IV BOLUS
1000.0000 mL | Freq: Once | INTRAVENOUS | Status: AC
  Administered 2020-10-10: 1000 mL via INTRAVENOUS

## 2020-10-10 MED ORDER — BETAMETHASONE SOD PHOS & ACET 6 (3-3) MG/ML IJ SUSP
12.0000 mg | Freq: Once | INTRAMUSCULAR | Status: AC
  Administered 2020-10-11: 12 mg via INTRAMUSCULAR

## 2020-10-10 MED ORDER — EPHEDRINE 5 MG/ML INJ: 10.0000 mg | INTRAVENOUS | Status: DC | PRN

## 2020-10-10 MED ORDER — LACTATED RINGERS IV SOLN: INTRAVENOUS | Status: DC

## 2020-10-10 MED ORDER — PENICILLIN G POT IN DEXTROSE 60000 UNIT/ML IV SOLN
3.0000 10*6.[IU] | INTRAVENOUS | Status: DC
  Administered 2020-10-10 – 2020-10-11 (×4): 3 10*6.[IU] via INTRAVENOUS
  Filled 2020-10-10 (×4): qty 50

## 2020-10-10 MED ORDER — FENTANYL-BUPIVACAINE-NACL 0.5-0.125-0.9 MG/250ML-% EP SOLN: 12.0000 mL/h | EPIDURAL | Status: DC | PRN

## 2020-10-10 MED ORDER — DIPHENHYDRAMINE HCL 50 MG/ML IJ SOLN: 12.5000 mg | INTRAMUSCULAR | Status: DC | PRN

## 2020-10-10 MED ORDER — MAGNESIUM SULFATE BOLUS VIA INFUSION
4.0000 g | Freq: Once | INTRAVENOUS | Status: AC
  Administered 2020-10-10: 4 g via INTRAVENOUS
  Filled 2020-10-10: qty 1000

## 2020-10-10 NOTE — H&P (Signed)
HPI: Heidi Martin is a 20 y.o. year old G12P0010 female at [redacted]w[redacted]d weeks gestation by LMP and 8 week [place] who presents to MAU reporting abdominal pain since this morning and then developed rectal pressure prompting her to come to MAU. Denies LOF, VB.   Cervix Was 2/70/-1, on arrival. Pt was given a fluid bolus, Procardia but contractions worsened. Asked to be rechecked due to worsening pain and pressure. Cervix changed to 3/90/-1 30 minutes later. Discussed w/ Dr. Jolayne Panther who recommends and admission and Mag Sulfate for Neuroprotection. Discussed w/ Dr. Tildon Husky, Neonatologist. Requested NICU consult.    Nursing Staff Provider  Office Location  [facility] Dating  LMP  Language  English Anatomy US  07/21/20  Flu Vaccine  Declined-06/23/20 Genetic Screen  NIPS: normal female  AFP:   First Screen:  Quad:    TDaP Vaccine    Hgb A1C or  GTT Early  Third trimester: Nml  COVID Vaccine Yes -both doses   LAB RESULTS   Rhogam  NA Blood Type O/Positive/-- (12/22 1433)   Feeding Plan Breast Antibody Negative (12/22 1433)  Contraception Undecided ?? Rubella 5.09 (12/22 1433)  Circumcision Yes RPR Non Reactive (12/22 1433)   Pediatrician  List Given HBsAg Negative (12/22 1433)   Support Person Reyes Fifield( MOM) HCVAb   Prenatal Classes  HIV Non Reactive (12/22 1433)     BTL Consent NA GBS   (For PCN allergy, check sensitivities)   VBAC Consent NA Pap NA    Hgb Electro  [place] Trait  BP Cuff Placed order thru [facility]     SMA     Waterbirth  [ ]  Class [ ]  Consent [ ]  CNM visit    Induction  [ ]  Orders Entered [ ] Foley Y/N    OB History    Gravida  2   Para      Term      Preterm      AB  1   Living        SAB  1   IAB      Ectopic      Multiple      Live Births             Past Medical History:  Diagnosis Date  . COVID-19   . Irritable bowel syndrome (IBS)   . Sickle cell trait [facility])    Past Surgical History:  Procedure Laterality Date  . INDUCED ABORTION    .  NO PAST SURGERIES     Family History: family history includes Cancer in her maternal grandmother and mother; Depression in her mother; Diabetes in her maternal grandmother; Hypertension in her mother; Miscarriages / Stillbirths in her maternal grandmother and mother. Social History:  reports that she has never smoked. She has never used smokeless tobacco. She reports that she does not drink alcohol and does not use drugs.     Maternal Diabetes: No Genetic Screening: Normal Maternal Ultrasounds/Referrals: IUGR Fetal Ultrasounds or other Referrals:  Referred to Materal Fetal Medicine  Maternal Substance Abuse:  No Significant Maternal Medications:  Meds include: Progesterone Significant Maternal Lab Results:  Group B Strep positive Other Comments:  Borderline FGR, Nml dopplers. Sickle Cell trait.   Review of Systems  Constitutional: Positive for chills and fever.  Gastrointestinal: Positive for abdominal pain. Negative for constipation, diarrhea, nausea and vomiting.  Genitourinary: Positive for vaginal pain. Negative for dysuria, flank pain, frequency, hematuria and vaginal bleeding.  Musculoskeletal: Negative for back pain.  Neurological: Negative for headaches.   Maternal Medical History:  Reason for admission: Contractions.  Nausea.  Contractions: Onset was 3-5 hours ago.   Frequency: regular.   Perceived severity is strong.    Fetal activity: Perceived fetal activity is normal.    Prenatal complications: IUGR.   No bleeding, PIH, oligohydramnios, placental abnormality, polyhydramnios or pre-eclampsia.   Short cervix  Prenatal Complications - Diabetes: none.    Dilation: 3 Effacement (%): 80 Station: -1 Exam by:: [place], CNM Blood pressure 106/63, pulse 87, temperature 97.9 F (36.6 C), temperature source Oral, resp. rate 16, height 5\' 7"  (1.702 m), weight 72.6 kg, last menstrual period 03/09/2020, SpO2 100 %, unknown if currently breastfeeding. Maternal Exam:   Uterine Assessment: Contraction strength is moderate.  Contraction frequency is regular.   Abdomen: Patient reports no abdominal tenderness. Estimated fetal weight is 2-5 on 3/24.   Fetal presentation: vertex  Introitus: Normal vulva. Vagina is positive for vaginal discharge (White, mildly malodorous).  Pelvis: adequate for delivery.   Cervix: Cervix evaluated by digital exam.     Fetal Exam Fetal Monitor Review: Baseline rate: 140.  Variability: moderate (6-25 bpm).   Pattern: accelerations present and no decelerations.    Fetal State Assessment: Category I - tracings are normal.     Physical Exam Constitutional:      General: She is in acute distress.     Appearance: Normal appearance. She is normal weight. She is not ill-appearing or toxic-appearing.  Eyes:     General: No scleral icterus.    Conjunctiva/sclera: Conjunctivae normal.  Cardiovascular:     Rate and Rhythm: Normal rate.  Pulmonary:     Effort: Pulmonary effort is normal.  Abdominal:     Palpations: Abdomen is soft.     Tenderness: There is no abdominal tenderness.     Comments: Firm contractions palpated  Genitourinary:    General: Normal vulva.     Vagina: Vaginal discharge (White, mildly malodorous) present.  Skin:    General: Skin is warm and dry.  Neurological:     Mental Status: She is alert and oriented to person, place, and time.  Psychiatric:        Mood and Affect: Mood normal.     Prenatal labs: ABO, Rh: --/--/O POS (04/10 1320) Antibody: NEG (04/10 1320) Rubella: 5.09 (12/22 1433) RPR: Non Reactive (04/08 0849)  HBsAg: Negative (12/22 1433)  HIV: Non Reactive (04/08 0849)  GBS:   Pos in urine  Assessment: 1. Labor: Preterm labor 2. Fetal Wellbeing: Category I  3. Borderline fetal growth restriction.   4. GBS: Pos in urine 5. 30.5 week IUP  Plan:  1. Admit to BS per consult with MD 2. Routine L&D orders 3. Analgesia/anesthesia PRN  4. Mag Sulfate for neuroprotection 5.  Betamethasone 2nd dose in 24 hours.  6. NICU consult   12-18-2002 10/10/2020, 4:01 PM

## 2020-10-10 NOTE — Progress Notes (Signed)
Labor Progress Note Heidi Martin is a 20 y.o. G2P0010 at [redacted]w[redacted]d presented for PTL. S: Doing well. Feeling contractions in lower abdomen and legs. Overall states she is not feeling well, no specific complaints.  O:  BP 115/74   Pulse 72   Temp 97.9 F (36.6 C) (Oral)   Resp 16   Ht 5\' 7"  (1.702 m)   Wt 72.6 kg   LMP 03/09/2020 (Exact Date) Comment: neg preg test 03/30/20  SpO2 100%   BMI 25.07 kg/m  EFM: baseline 120bpm/mod variability/+ accels/no decels Toco: difficult to trace  CVE: Dilation: 3 Effacement (%): 80 Cervical Position: Posterior Station: -1 Presentation: Vertex Exam by:: [place], RN   A&P: 20 y.o. G2P0010 [redacted]w[redacted]d presented for PTL. #PTL: S/p BMZ x1 @1400 . Mg. Cervical exam unchanged since presentation, although patient states she is uncomfortable and has had fentanyl. Counseled that we would like her to stay pregnant at this time to receive her second dose of BMZ if able. Patient amendable. #Pain: PRN, defer epidural at this time #FWB: cat 1 #GBS positive, PCN   [redacted]w[redacted]d, MD 8:35 PM

## 2020-10-10 NOTE — Progress Notes (Addendum)
MD in delivery.  Order given to RN for SVE d/t pt increased pain and pressure.

## 2020-10-10 NOTE — Consult Note (Signed)
Neonatology Consult  Note:  At the request of the patients obstetrician Dr. Elly Modena I met with Phineas Inches who is a 20 y.o. G2P0010 at 60w2dwho was admitted today for PTL.   She is currently monitored for the following issues for this high-risk pregnancy:  History of therapeutic abortion; Positive GBS test; S[place] cell trait ([place]; and Short cervix.  She is being managed with magnesium sulfate.     We reviewed initial delivery room management, including CPAP, [place], and low but certainly possible need for intubation for surfactant administration.  We discussed feeding immaturity and need for full po intake with multiple days of good weight gain and no apnea or bradycardia before discharge.  We reviewed increased risk of jaundice, infection, and temperature instability.   Discussed likely length of stay.  Thank you for allowing [place] participate in her care.  Please call with questions.  BHiginio Roger DO  Neonatologist  The total length of face-to-face or floor / unit time for this encounter was 25 minutes.  Counseling and / or coordination of care was greater than fifty percent of the time.

## 2020-10-11 ENCOUNTER — Other Ambulatory Visit: Payer: Self-pay

## 2020-10-11 LAB — GC/CHLAMYDIA PROBE AMP (~~LOC~~) NOT AT ARMC
Chlamydia: NEGATIVE
Comment: NEGATIVE
Comment: NORMAL
Neisseria Gonorrhea: NEGATIVE

## 2020-10-11 LAB — RPR: RPR Ser Ql: NONREACTIVE

## 2020-10-11 MED ORDER — ZOLPIDEM TARTRATE 5 MG PO TABS: 5.0000 mg | ORAL_TABLET | Freq: Every evening | ORAL | Status: DC | PRN

## 2020-10-11 MED ORDER — NIFEDIPINE 10 MG PO CAPS
10.0000 mg | ORAL_CAPSULE | Freq: Four times a day (QID) | ORAL | Status: DC
  Administered 2020-10-11 – 2020-10-12 (×4): 10 mg via ORAL
  Filled 2020-10-11 (×3): qty 1

## 2020-10-11 MED ORDER — BISACODYL 5 MG PO TBEC
5.0000 mg | DELAYED_RELEASE_TABLET | Freq: Every day | ORAL | Status: DC | PRN
  Administered 2020-10-11: 5 mg via ORAL
  Filled 2020-10-11: qty 1

## 2020-10-11 MED ORDER — CALCIUM CARBONATE ANTACID 500 MG PO CHEW: 2.0000 | CHEWABLE_TABLET | ORAL | Status: DC | PRN

## 2020-10-11 MED ORDER — PRENATAL MULTIVITAMIN CH
1.0000 | ORAL_TABLET | Freq: Every day | ORAL | Status: DC
  Administered 2020-10-12: 1 via ORAL
  Filled 2020-10-11: qty 1

## 2020-10-11 MED ORDER — DOCUSATE SODIUM 100 MG PO CAPS
100.0000 mg | ORAL_CAPSULE | Freq: Every day | ORAL | Status: DC
  Administered 2020-10-12: 100 mg via ORAL
  Filled 2020-10-11: qty 1

## 2020-10-11 NOTE — Progress Notes (Signed)
Labor Progress Note Heidi Martin is a 20 y.o. G2P0010 at [redacted]w[redacted]d presented for PTL. S: Slept well throughout the night. Patient awakened with some LLQ pain. Denies regular contractions. No LOF. Denies vaginal pressure/pain/urge to push.  O:  BP 107/64 (BP Location: Left Arm)   Pulse 82   Temp 98.1 F (36.7 C) (Oral)   Resp 16   Ht 5\' 7"  (1.702 m)   Wt 72.6 kg   LMP 03/09/2020 (Exact Date) Comment: neg preg test 03/30/20  SpO2 99%   BMI 25.07 kg/m  EFM: baseline 130bpm/mod variability/+ accels/no decels Toco: quiet  CVE: Dilation: 3 Effacement (%): 80 Cervical Position: Posterior Station: -1 Presentation: Vertex Exam by:: Warren-Hill, RN   A&P: 20 y.o. G2P0010 [redacted]w[redacted]d presented for PTL. #PTL: S/p BMZ x1 @1400 . Mg. Will only perform cervical exam if patient feeling increased pain/pressure that is constant. #Pain: PRN, defer epidural at this time #FWB: cat 1 #GBS positive, PCN, adequate prophylaxis   [redacted]w[redacted]d, MD 6:28 AM

## 2020-10-11 NOTE — Progress Notes (Signed)
Labor Progress Note Heidi Martin is a 20 y.o. G2P0010 at [redacted]w[redacted]d presented for PTL. S: Strip reviewed. O:  BP (!) 94/56   Pulse 70   Temp 98.1 F (36.7 C) (Oral)   Resp 16   Ht 5\' 7"  (1.702 m)   Wt 72.6 kg   LMP 03/09/2020 (Exact Date) Comment: neg preg test 03/30/20  SpO2 99%   BMI 25.07 kg/m  EFM: baseline 120bpm/mod variability/+ accels/no decels Toco: quiet  CVE: Dilation: 3 Effacement (%): 80 Cervical Position: Posterior Station: -1 Presentation: Vertex Exam by:: Warren-[place], RN   A&P: 20 y.o. G2P0010 [redacted]w[redacted]d presented for PTL. #PTL: S/p BMZ x1 @1400 . Mg. Will only perform cervical exam if patient feeling increased pain/pressure that is constant. #Pain: PRN, defer epidural at this time #FWB: cat 1 #GBS positive, PCN   [redacted]w[redacted]d, MD 12:44 AM

## 2020-10-12 ENCOUNTER — Telehealth: Payer: Self-pay

## 2020-10-12 ENCOUNTER — Ambulatory Visit: Payer: Medicaid Other | Admitting: Clinical

## 2020-10-12 DIAGNOSIS — Z5329 Procedure and treatment not carried out because of patient's decision for other reasons: Secondary | ICD-10-CM

## 2020-10-12 DIAGNOSIS — Z91199 Patient's noncompliance with other medical treatment and regimen due to unspecified reason: Secondary | ICD-10-CM

## 2020-10-12 DIAGNOSIS — Z3A31 31 weeks gestation of pregnancy: Secondary | ICD-10-CM

## 2020-10-12 MED ORDER — NIFEDIPINE 10 MG PO CAPS: 10.0000 mg | ORAL_CAPSULE | Freq: Four times a day (QID) | ORAL | 1 refills | Status: DC

## 2020-10-12 NOTE — Discharge Summary (Signed)
Patient ID: Heidi Martin MRN: [id number] DOB/AGE: [birth year] 20 y.o.  Admit date: 10/10/2020 Discharge date: 10/12/2020  Admission Diagnoses:Preterm labor  Discharge Diagnoses: Preterm labor  Prenatal Procedures: none  Consults: Neonatology, Maternal Fetal Medicine  Hospital Course:  This is a 20 y.o. G2P0010 with IUP at [redacted]w[redacted]d admitted for preterm contractions. She was admitted with contractions, noted to have a cervical exam of 3 cm  .  No leaking of fluid and no bleeding.  She was initially started on magnesium sulfate for tocolysis and neuroprotection and also received betamethasone x 2 doses.  Her tocolysis was transitioned to Procardia.  She was observed, fetal heart rate monitoring remained reassuring, and she had no signs/symptoms of progressing preterm labor or other maternal-fetal concerns.  Her cervical exam was unchanged from admission.  She was deemed stable for discharge to home with outpatient follow up.  Discharge Exam: Temp:  [98.2 F (36.8 C)-98.6 F (37 C)] 98.2 F (36.8 C) (04/12 0733) Pulse Rate:  [67-91] 84 (04/12 0733) Resp:  [16-20] 16 (04/12 0733) BP: (88-111)/(47-62) 96/47 (04/12 0733) SpO2:  [100 %] 100 % (04/12 0733) Physical Examination: CONSTITUTIONAL: Well-developed, well-nourished female in no acute distress.  HENT:  Normocephalic, atraumatic, External right and left ear normal. Oropharynx is clear and moist EYES: Conjunctivae and EOM are normal. Pupils are equal, round, and reactive to light. No scleral icterus.  NECK: Normal range of motion, supple, no masses SKIN: Skin is warm and dry. No rash noted. Not diaphoretic. No erythema. No pallor. NEUROLGIC: Alert and oriented to person, place, and time. Normal reflexes, muscle tone coordination. No cranial nerve deficit noted. PSYCHIATRIC: Normal mood and affect. Normal behavior. Normal judgment and thought content. CARDIOVASCULAR: Normal heart rate noted, regular rhythm RESPIRATORY: Effort and breath  sounds normal, no problems with respiration noted MUSCULOSKELETAL: Normal range of motion. No edema and no tenderness. 2+ distal pulses. ABDOMEN: Soft, nontender, nondistended, gravid. CERVIX: Dilation: 3 ("no change" per Dr. Debroah Loop) Effacement (%): 20 Cervical Position: Posterior Station: -1 Presentation: Vertex Exam by:: Dr. Debroah Loop  Fetal monitoring: FHR: 150 bpm, Variability: moderate, Accelerations: Present, Decelerations: Absent  Uterine activity: 0 contractions per hour  Significant Diagnostic Studies:  Results for orders placed or performed during the hospital encounter of 10/10/20 (from the past 168 hour(s))  GC/Chlamydia probe amp ([place])not at [facility]   Collection Time: 10/10/20 12:00 AM  Result Value Ref Range   Neisseria Gonorrhea Negative    Chlamydia Negative    Comment Normal Reference Ranger Chlamydia - Negative    Comment      Normal Reference Range Neisseria Gonorrhea - Negative  Type and screen [facility]   Collection Time: 10/10/20  1:20 PM  Result Value Ref Range   ABO/RH(D) O POS    Antibody Screen NEG    Sample Expiration      10/13/2020,2359 Performed at [facility], [address], [place], [place] [address]   Resp Panel by RT-PCR (Flu A&B, Covid) Vaginal/Rectal   Collection Time: 10/10/20  1:56 PM   Specimen: Vaginal/Rectal; Nasopharyngeal(NP) swabs in vial transport medium  Result Value Ref Range   SARS Coronavirus 2 by RT PCR NEGATIVE NEGATIVE   Influenza A by PCR NEGATIVE NEGATIVE   Influenza B by PCR NEGATIVE NEGATIVE  Group B strep by PCR   Collection Time: 10/10/20  2:47 PM   Specimen: Vaginal/Rectal; Genital  Result Value Ref Range   Group B strep by PCR POSITIVE (A) NEGATIVE  Wet prep, genital  Collection Time: 10/10/20  2:47 PM   Specimen: Vaginal/Rectal; Genital  Result Value Ref Range   Yeast Wet Prep HPF POC NONE SEEN NONE SEEN   Trich, Wet Prep NONE SEEN NONE SEEN   Clue Cells Wet Prep HPF POC  PRESENT (A) NONE SEEN   WBC, Wet Prep HPF POC MANY (A) NONE SEEN   Sperm NONE SEEN   CBC   Collection Time: 10/10/20  4:41 PM  Result Value Ref Range   WBC 21.7 (H) 4.0 - 10.5 K/uL   RBC 4.08 3.87 - 5.11 MIL/uL   Hemoglobin 11.1 (L) 12.0 - 15.0 g/dL   HCT 63.8 (L) 46.6 - 59.9 %   MCV 81.4 80.0 - 100.0 fL   MCH 27.2 26.0 - 34.0 pg   MCHC 33.4 30.0 - 36.0 g/dL   RDW 35.7 01.7 - 79.3 %   Platelets 271 150 - 400 K/uL   nRBC 0.0 0.0 - 0.2 %  RPR   Collection Time: 10/10/20  4:41 PM  Result Value Ref Range   RPR Ser Ql NON REACTIVE NON REACTIVE  Results for orders placed or performed in visit on 10/08/20 (from the past 168 hour(s))  CBC   Collection Time: 10/08/20  8:49 AM  Result Value Ref Range   WBC 15.3 (H) 3.4 - 10.8 x10E3/uL   RBC 4.26 3.77 - 5.28 x10E6/uL   Hemoglobin 11.3 11.1 - 15.9 g/dL   Hematocrit 90.3 00.9 - 46.6 %   MCV 82 79 - 97 fL   MCH 26.5 (L) 26.6 - 33.0 pg   MCHC 32.2 31.5 - 35.7 g/dL   RDW 23.3 00.7 - 62.2 %   Platelets 257 150 - 450 x10E3/uL  Glucose Tolerance, 2 Hours w/1 Hour   Collection Time: 10/08/20  8:49 AM  Result Value Ref Range   Glucose, Fasting 72 65 - 91 mg/dL   Glucose, 1 hour 81 65 - 179 mg/dL   Glucose, 2 hour 78 65 - 152 mg/dL  HIV Antibody (routine testing w rflx)   Collection Time: 10/08/20  8:49 AM  Result Value Ref Range   HIV Screen 4th Generation wRfx Non Reactive Non Reactive  RPR   Collection Time: 10/08/20  8:49 AM  Result Value Ref Range   RPR Ser Ql Non Reactive Non Reactive  Results for orders placed or performed in visit on 10/07/20 (from the past 168 hour(s))  Cervicovaginal ancillary only( [place])   Collection Time: 10/07/20 11:31 AM  Result Value Ref Range   Neisseria Gonorrhea Negative    Chlamydia Negative    Trichomonas Negative    Bacterial Vaginitis (gardnerella) Positive (A)    Candida Vaginitis Negative    Candida Glabrata Negative    Comment      Normal Reference Range Bacterial Vaginosis -  Negative   Comment Normal Reference Range Candida Species - Negative    Comment Normal Reference Range Candida Galbrata - Negative    Comment Normal Reference Range Trichomonas - Negative    Comment Normal Reference Ranger Chlamydia - Negative    Comment      Normal Reference Range Neisseria Gonorrhea - Negative    Discharge Condition: Stable  Disposition: Discharge disposition: 01-Home or Self Care        Discharge Instructions    Discharge patient   Complete by: As directed    Discharge disposition: 01-Home or Self Care   Discharge patient date: 10/12/2020     Allergies as of 10/12/2020  Reactions   Milk [lac Bovis] Nausea And Vomiting   Stomach cramping      Medication List    STOP taking these medications   polyethylene glycol 17 g packet Commonly known as: MIRALAX / GLYCOLAX     TAKE these medications   acetaminophen 500 MG tablet Commonly known as: TYLENOL Take 2 tablets (1,000 mg total) by mouth every 8 (eight) hours as needed.   ferrous sulfate 325 (65 FE) MG EC tablet Take 1 tablet (325 mg total) by mouth every other day.   NIFEdipine 10 MG capsule Commonly known as: PROCARDIA Take 1 capsule (10 mg total) by mouth every 6 (six) hours.   ondansetron 8 MG disintegrating tablet Commonly known as: Zofran ODT Take 1 tablet (8 mg total) by mouth every 8 (eight) hours as needed for nausea or vomiting.   prenatal multivitamin Tabs tablet Take 1 tablet by mouth daily at 12 noon.       Follow-up Information    [facility] Follow up in 2 day(s).   Specialty: Obstetrics and Gynecology Contact information: [address] [place] [address] [phone]              Signed: Scheryl Darter M.D. 10/12/2020, 9:30 AM

## 2020-10-12 NOTE — Telephone Encounter (Signed)
Per MyChart pt requested a call asap.  Per chart review pt is currently in the hospital and is to be discharged today.  Called pt and pt informed me that she just wanted to make sure that [place] [place] knows that she was in the hospital.  I informed pt that all her information and her admission is located in her chart so the provider will be able to see that she was admitted.  Pt stated that that was she wanted to say.  Pt did not have any other questions at this time.    Heidi Martin  10/12/20

## 2020-10-12 NOTE — Discharge Instructions (Signed)
Preterm Labor Pregnancy normally lasts 39-41 weeks. Preterm labor is when labor starts before you have been pregnant for 37 weeks. Babies who are born too early may have problems with blood sugar, body temperature, heart, and breathing. These problems may be very serious in babies who are born before 34 weeks of pregnancy. What are the causes? The cause of this condition is not known. What increases the risk? You are more likely to have preterm labor if:  You have medical problems, now or in the past.  You have problems now or in your past pregnancies.  You have lifestyle problems. Medical history  You have problems of the womb (uterus).  You have an infection, including infections you get from sex.  You have problems that do not go away, such as: ? Blood clots. ? High blood pressure. ? High blood sugar.  You have low body weight or too much body weight. Present and past pregnancies  You have had preterm labor before.  You are pregnant with two babies or more.  You have a condition in which the placenta covers your cervix.  You waited less than 6 months between giving birth and becoming pregnant again.  Your unborn baby has some problems.  You have bleeding from your vagina.  You became pregnant by a method called IVF. Lifestyle  You smoke.  You drink alcohol.  You use drugs.  You have stress.  You have abuse in your home.  You come in contact with chemicals that harm the body (pollutants). Other factors  You are younger than 17 years or older than 35 years. What are the signs or symptoms? Symptoms of this condition include:  Cramps. The cramps may feel like cramps from a period.  You may have watery poop (diarrhea).  Pain in the belly (abdomen).  Pain in the lower back.  Regular contractions. It may feel like your belly is getting tighter.  Pressure in the lower belly.  More fluid leaking from the vagina. The fluid may be watery or  bloody.  Water breaking. How is this treated? Treatment for this condition depends on your health, the health of your baby, and how old your pregnancy is. It may include:  Taking medicines, such as: ? Hormone medicines. ? Medicines to stop contractions. ? Medicines to help mature the baby's lungs. ? Medicines to prevent your baby from getting cerebral palsy.  Bed rest. If the labor happens before 34 weeks of pregnancy, you may need to stay in the hospital.  Delivering the baby. Follow these instructions at home:  Do not use any products that contain nicotine or tobacco, such as cigarettes, e-cigarettes, and chewing tobacco. If you need help quitting, ask your doctor.  Do not drink alcohol.  Take over-the-counter and prescription medicines only as told by your doctor.  Rest as told by your doctor.  Return to your activities as told by your doctor. Ask your doctor what activities are safe for you.  Keep all follow-up visits as told by your doctor. This is important.   How is this prevented? To have a healthy pregnancy:  Do not use street drugs.  Do not use any medicines unless you ask your doctor if they are safe for you.  Talk with your doctor before taking any herbal supplements.  Make sure you gain enough weight.  Watch for infection. If you think you might have an infection, get it checked right away. Symptoms of infection may include: ? Fever. ? Vaginal discharge. ?   Pain or burning when you pee. ? Needing to pee urgently. ? Needing to pee often. ? Peeing small amounts often. ? Blood in your pee. ? Pee that smells bad or unusual.  Tell your doctor if you have gone into preterm labor before. Contact a doctor if:  You think you are going into preterm labor.  You have symptoms of preterm labor.  You have symptoms of infection. Get help right away if:  You are having painful contractions every 5 minutes or less.  Your water breaks. Summary  Preterm labor  is labor that starts before you reach 37 weeks of pregnancy.  Your baby may have problems if delivered early.  The cause of preterm labor is not known. Having problems of the womb (uterus), an infection, or bleeding during pregnancy increases the risk.  Contact a doctor if you have signs or symptoms of preterm labor. This information is not intended to replace advice given to you by your health care provider. Make sure you discuss any questions you have with your health care provider. Document Revised: 07/22/2019 Document Reviewed: 07/22/2019 Elsevier Patient Education  2021 Elsevier Inc.  

## 2020-10-12 NOTE — Plan of Care (Signed)

## 2020-10-13 ENCOUNTER — Other Ambulatory Visit: Payer: Self-pay

## 2020-10-13 ENCOUNTER — Encounter: Payer: Self-pay | Admitting: *Deleted

## 2020-10-13 ENCOUNTER — Ambulatory Visit: Payer: Medicaid Other | Attending: Obstetrics and Gynecology

## 2020-10-13 ENCOUNTER — Ambulatory Visit: Payer: Medicaid Other | Admitting: *Deleted

## 2020-10-13 ENCOUNTER — Other Ambulatory Visit: Payer: Self-pay | Admitting: Obstetrics

## 2020-10-13 DIAGNOSIS — O26873 Cervical shortening, third trimester: Secondary | ICD-10-CM | POA: Diagnosis not present

## 2020-10-13 DIAGNOSIS — O365930 Maternal care for other known or suspected poor fetal growth, third trimester, not applicable or unspecified: Secondary | ICD-10-CM | POA: Insufficient documentation

## 2020-10-13 DIAGNOSIS — Z3A31 31 weeks gestation of pregnancy: Secondary | ICD-10-CM

## 2020-10-13 DIAGNOSIS — Z3480 Encounter for supervision of other normal pregnancy, unspecified trimester: Secondary | ICD-10-CM | POA: Diagnosis present

## 2020-10-13 DIAGNOSIS — Z362 Encounter for other antenatal screening follow-up: Secondary | ICD-10-CM

## 2020-10-13 DIAGNOSIS — Z862 Personal history of diseases of the blood and blood-forming organs and certain disorders involving the immune mechanism: Secondary | ICD-10-CM | POA: Diagnosis not present

## 2020-10-13 DIAGNOSIS — Z148 Genetic carrier of other disease: Secondary | ICD-10-CM

## 2020-10-24 ENCOUNTER — Other Ambulatory Visit: Payer: Self-pay | Admitting: Obstetrics & Gynecology

## 2020-10-25 ENCOUNTER — Encounter: Payer: Medicaid Other | Admitting: Advanced Practice Midwife

## 2020-10-26 ENCOUNTER — Ambulatory Visit (INDEPENDENT_AMBULATORY_CARE_PROVIDER_SITE_OTHER): Payer: Medicaid Other | Admitting: Advanced Practice Midwife

## 2020-10-26 ENCOUNTER — Other Ambulatory Visit: Payer: Self-pay

## 2020-10-26 VITALS — BP 108/70 | HR 93 | Wt 162.8 lb

## 2020-10-26 DIAGNOSIS — Z3480 Encounter for supervision of other normal pregnancy, unspecified trimester: Secondary | ICD-10-CM

## 2020-10-26 DIAGNOSIS — O365990 Maternal care for other known or suspected poor fetal growth, unspecified trimester, not applicable or unspecified: Secondary | ICD-10-CM

## 2020-10-26 DIAGNOSIS — Z23 Encounter for immunization: Secondary | ICD-10-CM

## 2020-10-26 DIAGNOSIS — O4700 False labor before 37 completed weeks of gestation, unspecified trimester: Secondary | ICD-10-CM

## 2020-10-26 DIAGNOSIS — Z364 Encounter for antenatal screening for fetal growth retardation: Secondary | ICD-10-CM

## 2020-10-26 MED ORDER — HYDROXYZINE HCL 25 MG PO TABS: 25.0000 mg | ORAL_TABLET | Freq: Four times a day (QID) | ORAL | 2 refills | Status: DC | PRN

## 2020-10-26 MED ORDER — ONDANSETRON 8 MG PO TBDP: 8.0000 mg | ORAL_TABLET | Freq: Three times a day (TID) | ORAL | 3 refills | Status: DC | PRN

## 2020-10-26 NOTE — Progress Notes (Signed)
   PRENATAL VISIT NOTE  Subjective:  Heidi Martin is a 20 y.o. G2P0010 at [redacted]w[redacted]d being seen today for ongoing prenatal care.  She is currently monitored for the following issues for this high-risk pregnancy and has Supervision of other normal pregnancy, antepartum; History of therapeutic abortion; Positive GBS test; Sickle cell trait (HCC); Short cervix; Preterm labor; and Pregnancy affected by fetal growth restriction on their problem list.  Patient reports preterm contractions which occur on almost a nightly basis. She is s/p inpatient evaluation two weeks ago for this complaint and is s/p Magnesium Sulfate for neuroprotection as well as BMZ x 2. Patient states she was given Procardia for outpatient management of her contractions but when she takes it she feels as if her heart is racing. She denies pain during office visit today. Contractions: Irregular. Vag. Bleeding: None.  Movement: Present. Denies leaking of fluid.   Patient and her mother also voice concern about increasing anxiety. She states that when she starts to feel uncomfortable and takes her Procardia she almost immediate feels her heart racing which in turn triggers her anxiety.  The following portions of the patient's history were reviewed and updated as appropriate: allergies, current medications, past family history, past medical history, past social history, past surgical history and problem list. Problem list updated.  Objective:   Vitals:   10/26/20 1330  BP: 108/70  Pulse: 93  Weight: 162 lb 12.8 oz (73.8 kg)    Fetal Status: Fetal Heart Rate (bpm): 132   Movement: Present     General:  Alert, oriented and cooperative. Patient is in no acute distress.  Skin: Skin is warm and dry. No rash noted.   Cardiovascular: Normal heart rate noted  Respiratory: Normal respiratory effort, no problems with respiration noted  Abdomen: Soft, gravid, appropriate for gestational age.  Pain/Pressure: Present     Pelvic: Cervical exam  deferred        Extremities: Normal range of motion.  Edema: None  Mental Status: Normal mood and affect. Normal behavior. Normal judgment and thought content.   Assessment and Plan:  Pregnancy: G2P0010 at [redacted]w[redacted]d  1. Supervision of other normal pregnancy, antepartum - Declined pelvic exam for visualization of cervix today - Declined vaginal swab - Tdap vaccine greater than or equal to 7yo IM  2. Ultrasound for antenatal screening for fetal growth restriction   3. Pregnancy affected by fetal growth restriction - Serial surveillance with MFM, plan for delivery at 37 weeks  4. Preterm contractions - S/p BMZ 04/10 and 04/11 - S/p MgSo4 for neuroprotection - Continue Procardia as previously prescribed - Add Vistaril PRN for mild anxiety - Present to MAU for preterm labor evaluation if > 6 contractions in one hour or if she experiences new onset chest pain  Preterm labor symptoms and general obstetric precautions including but not limited to vaginal bleeding, contractions, leaking of fluid and fetal movement were reviewed in detail with the patient. Please refer to After Visit Summary for other counseling recommendations.  Return in one week for ongoing surveillance of pain in the evenings.  Please work into [employer] schedule early next week.  Future Appointments  Date Time Provider Department Center  11/01/2020  1:20 PM Marylene Land, CNM [facility]    [place], [place]

## 2020-10-26 NOTE — Patient Instructions (Signed)
Fetal Movement Counts Patient Name: ________________________________________________ Patient Due Date: ____________________  What is a fetal movement count? A fetal movement count is the number of times that you feel your baby move during a certain amount of time. This may also be called a fetal kick count. A fetal movement count is recommended for every pregnant woman. You may be asked to start counting fetal movements as early as week 28 of your pregnancy. Pay attention to when your baby is most active. You may notice your baby's sleep and wake cycles. You may also notice things that make your baby move more. You should do a fetal movement count:  When your baby is normally most active.  At the same time each day. A good time to count movements is while you are resting, after having something to eat and drink. How do I count fetal movements? 1. Find a quiet, comfortable area. Sit, or lie down on your side. 2. Write down the date, the start time and stop time, and the number of movements that you felt between those two times. Take this information with you to your health care visits. 3. Write down your start time when you feel the first movement. 4. Count kicks, flutters, swishes, rolls, and jabs. You should feel at least 10 movements. 5. You may stop counting after you have felt 10 movements, or if you have been counting for 2 hours. Write down the stop time. 6. If you do not feel 10 movements in 2 hours, contact your health care provider for further instructions. Your health care provider may want to do additional tests to assess your baby's well-being. Contact a health care provider if:  You feel fewer than 10 movements in 2 hours.  Your baby is not moving like he or she usually does. Date: ____________ Start time: ____________ Stop time: ____________ Movements: ____________ Date: ____________ Start time: ____________ Stop time: ____________ Movements: ____________ Date: ____________  Start time: ____________ Stop time: ____________ Movements: ____________ Date: ____________ Start time: ____________ Stop time: ____________ Movements: ____________ Date: ____________ Start time: ____________ Stop time: ____________ Movements: ____________ Date: ____________ Start time: ____________ Stop time: ____________ Movements: ____________ Date: ____________ Start time: ____________ Stop time: ____________ Movements: ____________ Date: ____________ Start time: ____________ Stop time: ____________ Movements: ____________ Date: ____________ Start time: ____________ Stop time: ____________ Movements: ____________ This information is not intended to replace advice given to you by your health care provider. Make sure you discuss any questions you have with your health care provider. Document Revised: 02/06/2019 Document Reviewed: 02/06/2019 Elsevier Patient Education  2021 Elsevier Inc.  

## 2020-10-31 ENCOUNTER — Encounter (HOSPITAL_COMMUNITY): Payer: Self-pay | Admitting: Obstetrics and Gynecology

## 2020-10-31 ENCOUNTER — Inpatient Hospital Stay (HOSPITAL_COMMUNITY)
Admission: AD | Admit: 2020-10-31 | Discharge: 2020-10-31 | Disposition: A | Discharge status: Home / Self Care | Payer: Medicaid Other | Attending: Obstetrics and Gynecology | Admitting: Obstetrics and Gynecology

## 2020-10-31 ENCOUNTER — Other Ambulatory Visit: Payer: Self-pay

## 2020-10-31 DIAGNOSIS — B9689 Other specified bacterial agents as the cause of diseases classified elsewhere: Secondary | ICD-10-CM | POA: Insufficient documentation

## 2020-10-31 DIAGNOSIS — O23593 Infection of other part of genital tract in pregnancy, third trimester: Secondary | ICD-10-CM | POA: Insufficient documentation

## 2020-10-31 DIAGNOSIS — O26893 Other specified pregnancy related conditions, third trimester: Secondary | ICD-10-CM | POA: Insufficient documentation

## 2020-10-31 DIAGNOSIS — R109 Unspecified abdominal pain: Secondary | ICD-10-CM | POA: Diagnosis present

## 2020-10-31 DIAGNOSIS — Z3689 Encounter for other specified antenatal screening: Secondary | ICD-10-CM

## 2020-10-31 DIAGNOSIS — Z3A33 33 weeks gestation of pregnancy: Secondary | ICD-10-CM

## 2020-10-31 DIAGNOSIS — R102 Pelvic and perineal pain: Secondary | ICD-10-CM | POA: Diagnosis not present

## 2020-10-31 LAB — URINALYSIS, ROUTINE W REFLEX MICROSCOPIC
Bilirubin Urine: NEGATIVE
Glucose, UA: NEGATIVE mg/dL
Hgb urine dipstick: NEGATIVE
Ketones, ur: NEGATIVE mg/dL
Nitrite: NEGATIVE
Protein, ur: NEGATIVE mg/dL
Specific Gravity, Urine: 1.006 (ref 1.005–1.030)
pH: 6 (ref 5.0–8.0)

## 2020-10-31 LAB — WET PREP, GENITAL
Sperm: NONE SEEN
Trich, Wet Prep: NONE SEEN
Yeast Wet Prep HPF POC: NONE SEEN

## 2020-10-31 MED ORDER — CYCLOBENZAPRINE HCL 5 MG PO TABS
10.0000 mg | ORAL_TABLET | Freq: Once | ORAL | Status: AC
  Administered 2020-10-31: 10 mg via ORAL
  Filled 2020-10-31: qty 2

## 2020-10-31 MED ORDER — CYCLOBENZAPRINE HCL 10 MG PO TABS: 10.0000 mg | ORAL_TABLET | Freq: Two times a day (BID) | ORAL | 0 refills | Status: DC | PRN

## 2020-10-31 MED ORDER — ACETAMINOPHEN 500 MG PO TABS
1000.0000 mg | ORAL_TABLET | Freq: Once | ORAL | Status: AC
  Administered 2020-10-31: 1000 mg via ORAL
  Filled 2020-10-31: qty 2

## 2020-10-31 MED ORDER — METRONIDAZOLE 500 MG PO TABS: 500.0000 mg | ORAL_TABLET | Freq: Two times a day (BID) | ORAL | 0 refills | Status: DC

## 2020-10-31 NOTE — Discharge Instructions (Signed)
Abdominal Pain During Pregnancy Abdominal pain is common during pregnancy and has many possible causes. Some causes are more serious than others, and sometimes the cause is not known. Abdominal pain can be a sign that labor is starting. It can also be caused by normal growth of your baby causing stretching of muscles and ligaments during pregnancy. Always tell your health care provider if you have any abdominal pain. Follow these instructions at home:  Do not have sex or put anything in your vagina until your pain goes away completely.  Get plenty of rest until your pain improves.  Drink enough fluid to keep your urine pale yellow.  Take over-the-counter and prescription medicines only as told by your health care provider.  Keep all follow-up visits. This is important.   Contact a health care provider if:  Your pain continues or gets worse after resting.  You have lower abdominal pain that: ? Comes and goes at regular intervals. ? Spreads to your back. ? Is similar to menstrual cramps.  You have pain or burning when you urinate. Get help right away if:  You have a fever, chills, or shortness of breath.  You have vaginal bleeding.  You are leaking fluid or passing tissue from your vagina.  You have vomiting or diarrhea that lasts for more than 24 hours.  Your baby is moving less than usual.  You feel very weak or faint.  You develop severe pain in your upper abdomen. Summary  Abdominal pain is common during pregnancy and has many possible causes.  If you experience abdominal pain during pregnancy, tell your health care provider right away.  Follow your health care provider's home care instructions and keep all follow-up visits as told. This information is not intended to replace advice given to you by your health care provider. Make sure you discuss any questions you have with your health care provider. Document Revised: 03/02/2020 Document Reviewed: 03/02/2020 Elsevier  Patient Education  2021 Elsevier Inc.  

## 2020-10-31 NOTE — MAU Note (Signed)
..  Heidi Martin is a 20 y.o. at [redacted]w[redacted]d here in MAU reporting: Abdominal cramping, pelvic and hip pain. Reports that the cramping is the same as when she was in MAU the last time, and has not had relief with the medication she was give. Reports the hip and pelvic pain is worse than before. Denies vaginal bleeding or leaking of fluid. +FM. Pain score: 7/10 abdominal pain. 9/10 hip and pelvic pain Vitals:   10/31/20 2009  BP: (!) 110/57  Pulse: 98  Resp: 17  Temp: 98.3 F (36.8 C)  SpO2: 100%     Lab orders placed from triage:  UA

## 2020-10-31 NOTE — MAU Provider Note (Signed)
History     CSN: [id number]  Arrival date and time: 10/31/20 1953   Event Date/Time   First Provider Initiated Contact with Patient 10/31/20 2049      Chief Complaint  Patient presents with  . Abdominal Pain  . Pelvic Pain  . Hip Pain   Heidi Martin is a 20 y.o. G2P0010 at [redacted]w[redacted]d who receives care at [facility].  She presents today for Abdominal Pain, Pelvic Pain, and Hip Pain.  Patient states she was seen on April 26th for the same complaint and has not received any relief with the pain medication prescribed.  She can not recall medication given, but review of chart shows she was not prescribed pain medication but given Procardia and Vistaril.  Patient states she has taken Procardia at 12pm.  She reports she has not taken anything for pain.  She states she has not tried any compresses.  She states she tried to sit in the tub yesterday, but it causes more hip pain.  She states she "just goes to sleep" to help cope with the pain.  She endorses fetal movement and denies contractions and vaginal discharge, bleeding, or leaking.    OB History    Gravida  2   Para      Term      Preterm      AB  1   Living        SAB  1   IAB      Ectopic      Multiple      Live Births              Past Medical History:  Diagnosis Date  . COVID-19   . Irritable bowel syndrome (IBS)   . Sickle cell trait [facility])     Past Surgical History:  Procedure Laterality Date  . INDUCED ABORTION    . NO PAST SURGERIES      Family History  Problem Relation Age of Onset  . Cancer Mother   . Depression Mother   . Miscarriages / [place] Mother   . Hypertension Mother   . Hypertension Father   . Cancer Maternal Grandmother   . Diabetes Maternal Grandmother   . Miscarriages / Stillbirths Maternal Grandmother     Social History   Tobacco Use  . Smoking status: Never Smoker  . Smokeless tobacco: Never Used  Vaping Use  . Vaping Use: Never used  Substance Use Topics  . Alcohol  use: No  . Drug use: No    Allergies:  Allergies  Allergen Reactions  . Milk [Lac Bovis] Nausea And Vomiting    Stomach cramping    Medications Prior to Admission  Medication Sig Dispense Refill Last Dose  . hydrOXYzine (ATARAX/VISTARIL) 25 MG tablet Take 1 tablet (25 mg total) by mouth every 6 (six) hours as needed for itching. 30 tablet 2 10/30/2020 at Unknown time  . NIFEdipine (PROCARDIA) 10 MG capsule Take 1 capsule (10 mg total) by mouth every 6 (six) hours. 20 capsule 1 10/31/2020 at 1200  . Prenatal Vit-Fe Fumarate-FA (PRENATAL MULTIVITAMIN) TABS tablet Take 1 tablet by mouth daily at 12 noon.   10/31/2020 at Unknown time  . progesterone (PROMETRIUM) 200 MG capsule Place 200 mg vaginally at bedtime.   Past Week at Unknown time  . acetaminophen (TYLENOL) 500 MG tablet Take 2 tablets (1,000 mg total) by mouth every 8 (eight) hours as needed. 30 tablet 0   . ferrous sulfate 325 (65 FE) MG  EC tablet Take 1 tablet (325 mg total) by mouth every other day. (Patient not taking: No sig reported) 45 tablet 1   . ondansetron (ZOFRAN ODT) 8 MG disintegrating tablet Take 1 tablet (8 mg total) by mouth every 8 (eight) hours as needed for nausea or vomiting. 20 tablet 3     Review of Systems  Gastrointestinal: Positive for abdominal pain (Lower ). Negative for constipation, diarrhea, nausea and vomiting.  Genitourinary: Positive for pelvic pain and vaginal pain. Negative for difficulty urinating, dysuria, vaginal bleeding and vaginal discharge.  Neurological: Negative for dizziness, light-headedness and headaches.   Physical Exam   Blood pressure 105/64, pulse 97, temperature 98.7 F (37.1 C), resp. rate 16, height 5\' 7"  (1.702 m), weight 76.1 kg, last menstrual period 03/09/2020, SpO2 100 %, unknown if currently breastfeeding.  Physical Exam Vitals reviewed. Exam conducted with a chaperone present.  Constitutional:      Appearance: She is well-developed.  HENT:     Head: Normocephalic and  atraumatic.  Cardiovascular:     Rate and Rhythm: Normal rate.  Pulmonary:     Effort: Pulmonary effort is normal. No respiratory distress.  Abdominal:     Palpations: Abdomen is soft.     Tenderness: There is abdominal tenderness in the suprapubic area.     Comments: Gravid  Genitourinary:    Comments: Wet prep collected blindly BME: 3/80/-2, Posterior, Medium to Soft, Vertex Skin:    General: Skin is warm and dry.  Neurological:     Mental Status: She is alert.     Fetal Assessment 145 bpm, Mod Var, -Decels, +Accels Toco: No ctx graphed  MAU Course   Results for orders placed or performed during the hospital encounter of 10/31/20 (from the past 24 hour(s))  Urinalysis, Routine w reflex microscopic Urine, Clean Catch     Status: Abnormal   Collection Time: 10/31/20  9:07 PM  Result Value Ref Range   Color, Urine YELLOW YELLOW   APPearance HAZY (A) CLEAR   Specific Gravity, Urine 1.006 1.005 - 1.030   pH 6.0 5.0 - 8.0   Glucose, UA NEGATIVE NEGATIVE mg/dL   Hgb urine dipstick NEGATIVE NEGATIVE   Bilirubin Urine NEGATIVE NEGATIVE   Ketones, ur NEGATIVE NEGATIVE mg/dL   Protein, ur NEGATIVE NEGATIVE mg/dL   Nitrite NEGATIVE NEGATIVE   Leukocytes,Ua LARGE (A) NEGATIVE   RBC / HPF 0-5 0 - 5 RBC/hpf   WBC, UA 21-50 0 - 5 WBC/hpf   Bacteria, UA FEW (A) NONE SEEN   Squamous Epithelial / LPF 6-10 0 - 5  Wet prep, genital     Status: Abnormal   Collection Time: 10/31/20  9:08 PM   Specimen: Urine, Clean Catch  Result Value Ref Range   Yeast Wet Prep HPF POC NONE SEEN NONE SEEN   Trich, Wet Prep NONE SEEN NONE SEEN   Clue Cells Wet Prep HPF POC PRESENT (A) NONE SEEN   WBC, Wet Prep HPF POC MANY (A) NONE SEEN   Sperm NONE SEEN    No results found.  MDM PE Labs: UA, WP, UC EFM Muscle Relaxant  Assessment and Plan  20 year old G2P0010  SIUP at 33.5weeks Cat I FT Pelvic Pain Abdominal Pain  -POC Reviewed -Reassured that no contractions noted/graphed. -Exam  performed and findings discussed. -Reassured that no cervical change has occurred. -Discussed methods to reduce pelvic and abdominal pain.  Cautioned that pain may not completely subside, but some interventions should cause for improvement. -Reviewed position  changes, belly band usage, warm compresses, and pain medication. -Informed that Procardia is for contractions and that tylenol should be used for pain. -Will give warm compress, tylenol, and flexeril. -Patient reports she was not treated for BV. -Will retest and treat if clue cells remain present. -NST reactive and okay to discontinue FM, but leave toco in place.   Cherre Robins MSN, CNM 10/31/2020, 8:50 PM    Reassessment (9:47 PM) -Results return as above. -Will send script for BV treatment.  Patient prefers oral treatment.   -Patient reports improvement with flexeril and tylenol dosing. Will send script for Flexeril.  -Instructed to keep next appt as scheduled: May 2nd -Precautions given. -Will give warm compresses for home use as she did not receive while here. -Encouraged to call or return to MAU if symptoms worsen or with the onset of new symptoms. -Discharged to home in improved condition.  Cherre Robins MSN, CNM Advanced Practice Provider, [facility] [employer]

## 2020-11-01 ENCOUNTER — Ambulatory Visit (INDEPENDENT_AMBULATORY_CARE_PROVIDER_SITE_OTHER): Payer: Medicaid Other | Admitting: Student

## 2020-11-01 VITALS — BP 109/69 | HR 84 | Wt 170.0 lb

## 2020-11-01 DIAGNOSIS — O365990 Maternal care for other known or suspected poor fetal growth, unspecified trimester, not applicable or unspecified: Secondary | ICD-10-CM

## 2020-11-01 DIAGNOSIS — Z3480 Encounter for supervision of other normal pregnancy, unspecified trimester: Secondary | ICD-10-CM

## 2020-11-01 DIAGNOSIS — Z3A33 33 weeks gestation of pregnancy: Secondary | ICD-10-CM

## 2020-11-01 MED ORDER — DOCUSATE SODIUM 100 MG PO CAPS: 100.0000 mg | ORAL_CAPSULE | Freq: Three times a day (TID) | ORAL | 0 refills | Status: DC

## 2020-11-01 MED ORDER — FLINTSTONES COMPLETE 18 MG PO CHEW: 1.0000 | CHEWABLE_TABLET | Freq: Every day | ORAL | 1 refills | Status: DC

## 2020-11-01 NOTE — Progress Notes (Signed)
Patient ID: Heidi Martin, female   DOB: 12-18-2000, 20 y.o.   MRN: [id number]   PRENATAL VISIT NOTE  Subjective:  Heidi Martin is a 20 y.o. G2P0010 at [redacted]w[redacted]d being seen today for ongoing prenatal care.  She is currently monitored for the following issues for this low-risk pregnancy and has Supervision of other normal pregnancy, antepartum; History of therapeutic abortion; Positive GBS test; Sickle cell trait (HCC); Short cervix; Preterm labor; and Pregnancy affected by fetal growth restriction on their problem list.  Patient reports pelvic pain like cramps, white creamy thick discharge. No odor, no itching with discharge. . Occasional gets blurry vision. Reports that she does not like her prenatal vitamins, requesting RX for iron pills and wants to know about all her medications. She reports that the nifedipine is making her have heart palpitations.   Contractions: Not present. Vag. Bleeding: None.  Movement: Present. Denies leaking of fluid.   The following portions of the patient's history were reviewed and updated as appropriate: allergies, current medications, past family history, past medical history, past social history, past surgical history and problem list.   Objective:   Vitals:   11/01/20 1355  BP: 109/69  Pulse: 84  Weight: 170 lb (77.1 kg)    Fetal Status: Fetal Heart Rate (bpm): 131 Fundal Height: 31 cm Movement: Present     General:  Alert, oriented and cooperative. Patient is in no acute distress.  Skin: Skin is warm and dry. No rash noted.   Cardiovascular: Normal heart rate noted  Respiratory: Normal respiratory effort, no problems with respiration noted  Abdomen: Soft, gravid, appropriate for gestational age.  Pain/Pressure: Present     Pelvic: Cervical exam deferred        Extremities: Normal range of motion.  Edema: Trace  Mental Status: Normal mood and affect. Normal behavior. Normal judgment and thought content.   Assessment and Plan:  Pregnancy: G2P0010 at  [redacted]w[redacted]d  1. Pregnancy affected by fetal growth restriction   2. [redacted] weeks gestation of pregnancy   3. Supervision of other normal pregnancy, antepartum   -Reviewed medicine with patient. Medicine regimen will be as follows:  -patient will take flagyl as prescribed; given in MAU last night -reduce taking procardia to twice a day and see if that helps with headaches and palpitations -keep taking progesterone at night -Will try flintstones gummies for PNV -She has elderberry with iron that she can take at home  Patient is FGR; she needs appts which have not been scheduled.  -Appt made for growth scan -App made for weekly Dopplers  Preterm labor symptoms and general obstetric precautions including but not limited to vaginal bleeding, contractions, leaking of fluid and fetal movement were reviewed in detail with the patient. Please refer to After Visit Summary for other counseling recommendations.   Return in about 3 weeks (around 11/22/2020), or HROB with MD.  Future Appointments  Date Time Provider Department Center  11/02/2020  3:30 PM [facility] NURSE [facility]  11/02/2020  3:45 PM [facility] US2 [facility]  11/17/2020  3:30 PM [facility] NURSE [facility]  11/17/2020  3:45 PM [facility] US4 [facility]  11/22/2020  3:55 PM Hermina Staggers, MD [facility]    Marylene Land, CNM

## 2020-11-01 NOTE — Patient Instructions (Signed)
How a Baby Grows During Pregnancy Pregnancy begins when a female's sperm enters a female's egg. This is called fertilization. Fertilization usually happens in one of the fallopian tubes that connect the ovaries to the uterus. The fertilized egg moves down the fallopian tube to the uterus. Once it reaches the uterus, it implants into the lining of the uterus and begins to grow. For the first 8 weeks, the fertilized egg is called an embryo. After 8 weeks, it is called a fetus. As the fetus continues to grow, it receives oxygen and nutrients through the placenta, which is an organ that grows to support the developing baby. The placenta is the life support system for the baby. It provides oxygen and nutrition and removes waste. How long does a typical pregnancy last? A pregnancy usually lasts 280 days, or about 40 weeks. Pregnancy is divided into three periods of growth, also called trimesters:  First trimester: 0-12 weeks.  Second trimester: 13-27 weeks.  Third trimester: 28-40 weeks. The day when your baby is ready to be born (full term) is your estimated date of delivery. However, most babies are not born on their estimated date of delivery. How does my baby develop month by month? First month  The fertilized egg attaches to the inside of the uterus.  Some cells will form the placenta. Others will form the fetus.  The arms, legs, brain, spinal cord, lungs, and heart begin to develop.  At the end of the first month, the heart begins to beat. Second month  The bones, inner ear, eyelids, hands, and feet form.  The genitals develop.  By the end of 8 weeks, all major organs are developing. Third month  All of the internal organs are forming.  Teeth develop below the gums.  Bones and muscles begin to grow. The spine can flex.  The skin is transparent.  Fingernails and toenails begin to form.  Arms and legs continue to grow longer, and hands and feet develop.  The fetus is about 3  inches (7.6 cm) long. Fourth month  The placenta is completely formed.  The external sex organs, neck, outer ear, eyebrows, eyelids, and fingernails are formed.  The fetus can hear, swallow, and move its arms and legs.  The kidneys begin to produce urine.  The skin is covered with a white, waxy coating (vernix) and very fine hair (lanugo). Fifth month  The fetus moves around more and can be felt for the first time (quickening).  The fetus starts to sleep and wake up and may begin to suck a finger.  The nails grow to the end of the fingers.  The organ in the digestive system that makes bile (gallbladder) functions and helps to digest nutrients.  If the fetus is a female, eggs are present in the ovaries. If the fetus is a female, testicles start to move down into the scrotum. Sixth month  The lungs are formed.  The eyes open. The brain continues to develop.  Your baby has fingerprints and toe prints. Your baby's hair grows thicker.  At the end of the second trimester, the fetus is about 9 inches (22.9 cm) long. Seventh month  The fetus kicks and stretches.  The eyes are developed enough to sense changes in light.  The hands can make a grasping motion.  The fetus responds to sound. Eighth month  Most organs and body systems are fully developed and functioning.  Bones harden, and taste buds develop. The fetus may hiccup.  Certain areas  of the brain are still developing. The skull remains soft. Ninth month  The fetus gains about  lb (0.23 kg) each week.  The lungs are fully developed.  Patterns of sleep develop.  The fetus's head typically moves into a head-down position (vertex) in the uterus to prepare for birth.  The fetus weighs 6-9 lb (2.72-4.08 kg) and is 19-20 inches (48.26-50.8 cm) long.   How do I know if my baby is developing well? Always talk with your health care provider about any concerns that you may have about your pregnancy and your baby. At each  prenatal visit, your health care provider will do several different tests to check on your health and keep track of your baby's development. These include:  Fundal height and position. To do this, your health care provider will: ? Measure your growing belly from your pubic bone to the top of the uterus using a tape measure. ? Feel your belly to determine your baby's position.  Heartbeat. An ultrasound in the first trimester can confirm pregnancy and show a heartbeat, depending on how far along you are. Your health care provider will check your baby's heart rate at every prenatal visit. You will also have a second trimester ultrasound to check your baby's development. Follow these instructions at home:  Take prenatal vitamins as told by your health care provider. These include vitamins such as folic acid, iron, calcium, and vitamin D. They are important for healthy development.  Take over-the-counter and prescription medicines only as told by your health care provider.  Keep all follow-up visits. This is important. Follow-up visits include prenatal care and screening tests. Summary  A pregnancy usually lasts 280 days, or about 40 weeks. Pregnancy is divided into three periods of growth, also called trimesters.  Your health care provider will monitor your baby's growth and development throughout your pregnancy.  Follow your health care provider's recommendations about taking prenatal vitamins and medicines during your pregnancy.  Talk with your health care provider if you have any concerns about your pregnancy or your developing baby. This information is not intended to replace advice given to you by your health care provider. Make sure you discuss any questions you have with your health care provider. Document Revised: 11/26/2019 Document Reviewed: 10/02/2019 Elsevier Patient Education  2021 Elsevier Inc.  

## 2020-11-01 NOTE — Progress Notes (Signed)
Occasional lower abdominal pain along with "period cramps" in pelvis. Patient also complains painful migraines, states her vision gets blurry sometimes

## 2020-11-02 ENCOUNTER — Ambulatory Visit: Payer: Medicaid Other

## 2020-11-02 ENCOUNTER — Ambulatory Visit: Payer: Medicaid Other | Attending: Student

## 2020-11-02 LAB — CULTURE, OB URINE

## 2020-11-05 ENCOUNTER — Inpatient Hospital Stay (HOSPITAL_COMMUNITY)
Admission: AD | Admit: 2020-11-05 | Discharge: 2020-11-05 | Disposition: A | Discharge status: Home / Self Care | Payer: Medicaid Other | Attending: Obstetrics and Gynecology | Admitting: Obstetrics and Gynecology

## 2020-11-05 ENCOUNTER — Encounter (HOSPITAL_COMMUNITY): Payer: Self-pay | Admitting: Obstetrics and Gynecology

## 2020-11-05 DIAGNOSIS — O365930 Maternal care for other known or suspected poor fetal growth, third trimester, not applicable or unspecified: Secondary | ICD-10-CM

## 2020-11-05 DIAGNOSIS — O4703 False labor before 37 completed weeks of gestation, third trimester: Secondary | ICD-10-CM

## 2020-11-05 DIAGNOSIS — Z3A34 34 weeks gestation of pregnancy: Secondary | ICD-10-CM | POA: Diagnosis not present

## 2020-11-05 MED ORDER — NIFEDIPINE 10 MG PO CAPS
10.0000 mg | ORAL_CAPSULE | ORAL | Status: AC
  Administered 2020-11-05 (×2): 10 mg via ORAL
  Filled 2020-11-05 (×3): qty 1

## 2020-11-05 MED ORDER — LACTATED RINGERS IV BOLUS
1000.0000 mL | Freq: Once | INTRAVENOUS | Status: AC
  Administered 2020-11-05: 1000 mL via INTRAVENOUS

## 2020-11-05 NOTE — MAU Provider Note (Incomplete)
History     CSN: [id number]  Arrival date and time: 11/05/20 2014   Event Date/Time   First Provider Initiated Contact with Patient 11/05/20 2034      Chief Complaint  Patient presents with  . Back Pain  . Contractions   [name] is a 20 y.o. year old G40P0010 female at [redacted]w[redacted]d weeks gestation who presents to MAU via EMS reporting low back pain (rated 10/10), pelvic pressure, vaginal and rectal pain. She has a h/o PTL; was 3 cm on 10/10/2020. Her pregnancy is complicated by FGR. She denies VB or LOF. She reports good (+) FM. She receives her [facility] at [facility].   OB History    Gravida  2   Para      Term      Preterm      AB  1   Living        SAB  1   IAB      Ectopic      Multiple      Live Births              Past Medical History:  Diagnosis Date  . COVID-19   . Irritable bowel syndrome (IBS)   . Sickle cell trait [facility])     Past Surgical History:  Procedure Laterality Date  . INDUCED ABORTION    . NO PAST SURGERIES      Family History  Problem Relation Age of Onset  . Cancer Mother   . Depression Mother   . Miscarriages / [place] Mother   . Hypertension Mother   . Hypertension Father   . Cancer Maternal Grandmother   . Diabetes Maternal Grandmother   . Miscarriages / Stillbirths Maternal Grandmother     Social History   Tobacco Use  . Smoking status: Never Smoker  . Smokeless tobacco: Never Used  Vaping Use  . Vaping Use: Never used  Substance Use Topics  . Alcohol use: No  . Drug use: No    Allergies:  Allergies  Allergen Reactions  . Milk [Lac Bovis] Nausea And Vomiting    Stomach cramping    Medications Prior to Admission  Medication Sig Dispense Refill Last Dose  . NIFEdipine (PROCARDIA) 10 MG capsule Take 1 capsule (10 mg total) by mouth every 6 (six) hours. 20 capsule 1 11/05/2020 at Unknown time  . Prenatal Vit-Fe Fumarate-FA (PRENATAL MULTIVITAMIN) TABS tablet Take 1 tablet by mouth daily at 12 noon.    11/05/2020 at Unknown time  . acetaminophen (TYLENOL) 500 MG tablet Take 2 tablets (1,000 mg total) by mouth every 8 (eight) hours as needed. (Patient not taking: Reported on 11/01/2020) 30 tablet 0   . cyclobenzaprine (FLEXERIL) 10 MG tablet Take 1 tablet (10 mg total) by mouth 2 (two) times daily as needed for muscle spasms. 10 tablet 0   . docusate sodium (STOOL SOFTENER) 100 MG capsule Take 1 capsule (100 mg total) by mouth in the morning, at noon, and at bedtime. 10 capsule 0   . ferrous sulfate 325 (65 FE) MG EC tablet Take 1 tablet (325 mg total) by mouth every other day. (Patient not taking: No sig reported) 45 tablet 1   . hydrOXYzine (ATARAX/VISTARIL) 25 MG tablet Take 1 tablet (25 mg total) by mouth every 6 (six) hours as needed for itching. 30 tablet 2   . metroNIDAZOLE (FLAGYL) 500 MG tablet Take 1 tablet (500 mg total) by mouth 2 (two) times daily. 14 tablet 0   .  ondansetron (ZOFRAN ODT) 8 MG disintegrating tablet Take 1 tablet (8 mg total) by mouth every 8 (eight) hours as needed for nausea or vomiting. 20 tablet 3   . Pediatric Multivitamins-Iron (FLINTSTONES COMPLETE) 18 MG CHEW Chew 1 tablet by mouth daily. 30 tablet 1   . progesterone (PROMETRIUM) 200 MG capsule Place 200 mg vaginally at bedtime.       Review of Systems  Constitutional: Negative.   Gastrointestinal: Positive for rectal pain.  Endocrine: Negative.   Genitourinary: Positive for pelvic pain (pressure in lower pelvis) and vaginal pain.   Physical Exam   Blood pressure 114/77, pulse 79, temperature 98.4 F (36.9 C), temperature source Oral, resp. rate 20, last menstrual period 03/09/2020, SpO2 98 %, unknown if currently breastfeeding.  Physical Exam  MAU Course  Procedures  MDM ***  Assessment and Plan  ***  Raelyn Mora, CNM 11/05/2020, 10:26 PM

## 2020-11-05 NOTE — MAU Provider Note (Signed)
History     CSN: [id number]  Arrival date and time: 11/05/20 2014   Event Date/Time   First Provider Initiated Contact with Patient 11/05/20 2034      Chief Complaint  Patient presents with  . Back Pain  . Contractions   Heidi Martin is a 20 y.o. year old G47P0010 female at [redacted]w[redacted]d weeks gestation who presents to MAU via EMS reporting low back pain (rated 10/10), pelvic pressure, vaginal and rectal pain. She has a h/o PTL; was 3 cm on 10/10/2020. Her pregnancy is complicated by FGR. She denies VB or LOF. She reports good (+) FM. She receives her [facility] at [facility]. Her pregnancy is complicated by PTL and FGR. She currently takes Procardia twice for preterm contractions; last dose was ~1300 on 11/05/20. Her mother is present and contributing to the history taking.    OB History    Gravida  2   Para      Term      Preterm      AB  1   Living        SAB  1   IAB      Ectopic      Multiple      Live Births              Past Medical History:  Diagnosis Date  . COVID-19   . Irritable bowel syndrome (IBS)   . Sickle cell trait [facility])     Past Surgical History:  Procedure Laterality Date  . INDUCED ABORTION    . NO PAST SURGERIES      Family History  Problem Relation Age of Onset  . Cancer Mother   . Depression Mother   . Miscarriages / [place] Mother   . Hypertension Mother   . Hypertension Father   . Cancer Maternal Grandmother   . Diabetes Maternal Grandmother   . Miscarriages / Stillbirths Maternal Grandmother     Social History   Tobacco Use  . Smoking status: Never Smoker  . Smokeless tobacco: Never Used  Vaping Use  . Vaping Use: Never used  Substance Use Topics  . Alcohol use: No  . Drug use: No    Allergies:  Allergies  Allergen Reactions  . Milk [Lac Bovis] Nausea And Vomiting    Stomach cramping    Medications Prior to Admission  Medication Sig Dispense Refill Last Dose  . NIFEdipine (PROCARDIA) 10 MG capsule Take 1  capsule (10 mg total) by mouth every 6 (six) hours. 20 capsule 1 11/05/2020 at Unknown time  . Prenatal Vit-Fe Fumarate-FA (PRENATAL MULTIVITAMIN) TABS tablet Take 1 tablet by mouth daily at 12 noon.   11/05/2020 at Unknown time  . acetaminophen (TYLENOL) 500 MG tablet Take 2 tablets (1,000 mg total) by mouth every 8 (eight) hours as needed. (Patient not taking: Reported on 11/01/2020) 30 tablet 0   . cyclobenzaprine (FLEXERIL) 10 MG tablet Take 1 tablet (10 mg total) by mouth 2 (two) times daily as needed for muscle spasms. 10 tablet 0   . docusate sodium (STOOL SOFTENER) 100 MG capsule Take 1 capsule (100 mg total) by mouth in the morning, at noon, and at bedtime. 10 capsule 0   . ferrous sulfate 325 (65 FE) MG EC tablet Take 1 tablet (325 mg total) by mouth every other day. (Patient not taking: No sig reported) 45 tablet 1   . hydrOXYzine (ATARAX/VISTARIL) 25 MG tablet Take 1 tablet (25 mg total) by mouth every 6 (  six) hours as needed for itching. 30 tablet 2   . metroNIDAZOLE (FLAGYL) 500 MG tablet Take 1 tablet (500 mg total) by mouth 2 (two) times daily. 14 tablet 0   . ondansetron (ZOFRAN ODT) 8 MG disintegrating tablet Take 1 tablet (8 mg total) by mouth every 8 (eight) hours as needed for nausea or vomiting. 20 tablet 3   . Pediatric Multivitamins-Iron (FLINTSTONES COMPLETE) 18 MG CHEW Chew 1 tablet by mouth daily. 30 tablet 1   . progesterone (PROMETRIUM) 200 MG capsule Place 200 mg vaginally at bedtime.       Review of Systems  Constitutional: Negative.   HENT: Negative.   Eyes: Negative.   Respiratory: Negative.   Cardiovascular: Negative.   Gastrointestinal: Positive for rectal pain.  Endocrine: Negative.   Genitourinary: Positive for pelvic pain (pressure in lower pelvis) and vaginal pain.  Musculoskeletal: Negative.   Skin: Negative.   Allergic/Immunologic: Negative.   Neurological: Negative.   Hematological: Negative.   Psychiatric/Behavioral: Negative.    Physical Exam    Blood pressure 114/77, pulse 79, temperature 98.4 F (36.9 C), temperature source Oral, resp. rate 20, last menstrual period 03/09/2020, SpO2 98 %, unknown if currently breastfeeding.  Physical Exam Vitals and nursing note reviewed. Exam conducted with a chaperone present.  Constitutional:      Appearance: Normal appearance. She is normal weight.  Cardiovascular:     Rate and Rhythm: Normal rate.  Abdominal:     Palpations: Abdomen is soft.  Genitourinary:    General: Normal vulva.     Comments: Dilation: 3 Effacement (%): 60 Cervical Position: Posterior Station: -3 Presentation: Vertex Exam by: Carloyn Jaeger, CNM  Musculoskeletal:        General: Normal range of motion.  Skin:    General: Skin is warm and dry.  Neurological:     Mental Status: She is alert and oriented to person, place, and time.  Psychiatric:        Mood and Affect: Mood normal.        Behavior: Behavior normal.        Thought Content: Thought content normal.        Judgment: Judgment normal.    REACTIVE NST - FHR: 135 bpm / moderate variability / accels present / decels absent / TOCO: regular every 2-4 mins; none after 2 doses of Procardia 10 mg MAU Course  Procedures  MDM LR bolus Procardia 10 mg every 20 mins x 3 doses -- only 2 doses given; 3rd not needed  Assessment and Plan  Preterm uterine contractions in third trimester, antepartum - Plan: Discharge patient - Advised to continue Procardia regimen as previously ordered - Return to MAU for 6 or more painful UC's in one hour - Keep[ scheduled appts with [facility] - Patient verbalized an understanding of the plan of care and agrees.   Raelyn Mora, CNM 11/05/2020, 10:26 PM

## 2020-11-05 NOTE — MAU Note (Addendum)
Pt came in EMS. Reports 10/10 back pain that started this afternoon, also reports pressure in her bottom. Denies VB, LOF and reports +FM. Vitals WNL. Pt previously here 4/10 PTL.

## 2020-11-08 ENCOUNTER — Telehealth: Payer: Self-pay | Admitting: Lactation Services

## 2020-11-08 NOTE — Telephone Encounter (Signed)
Called patient at her request. Request for call was sent to the office 3 days ago. Patient did not answer. LM for her to call the office at her convenience if she still has questions or concerns. My Chart message sent.

## 2020-11-10 ENCOUNTER — Ambulatory Visit: Payer: Medicaid Other | Admitting: *Deleted

## 2020-11-10 ENCOUNTER — Other Ambulatory Visit: Payer: Self-pay

## 2020-11-10 ENCOUNTER — Other Ambulatory Visit: Payer: Self-pay | Admitting: *Deleted

## 2020-11-10 ENCOUNTER — Encounter: Payer: Self-pay | Admitting: *Deleted

## 2020-11-10 ENCOUNTER — Ambulatory Visit: Payer: Medicaid Other | Attending: Student

## 2020-11-10 DIAGNOSIS — O365990 Maternal care for other known or suspected poor fetal growth, unspecified trimester, not applicable or unspecified: Secondary | ICD-10-CM | POA: Diagnosis present

## 2020-11-10 DIAGNOSIS — Z3480 Encounter for supervision of other normal pregnancy, unspecified trimester: Secondary | ICD-10-CM | POA: Diagnosis present

## 2020-11-10 DIAGNOSIS — Z853 Personal history of malignant neoplasm of breast: Secondary | ICD-10-CM | POA: Diagnosis not present

## 2020-11-10 DIAGNOSIS — O26873 Cervical shortening, third trimester: Secondary | ICD-10-CM | POA: Diagnosis not present

## 2020-11-10 DIAGNOSIS — Z362 Encounter for other antenatal screening follow-up: Secondary | ICD-10-CM | POA: Diagnosis not present

## 2020-11-10 DIAGNOSIS — O365930 Maternal care for other known or suspected poor fetal growth, third trimester, not applicable or unspecified: Secondary | ICD-10-CM

## 2020-11-10 DIAGNOSIS — Z3A33 33 weeks gestation of pregnancy: Secondary | ICD-10-CM

## 2020-11-10 DIAGNOSIS — Z148 Genetic carrier of other disease: Secondary | ICD-10-CM

## 2020-11-14 ENCOUNTER — Encounter (HOSPITAL_COMMUNITY): Payer: Self-pay | Admitting: Obstetrics and Gynecology

## 2020-11-14 ENCOUNTER — Inpatient Hospital Stay (HOSPITAL_COMMUNITY)
Admission: AD | Admit: 2020-11-14 | Discharge: 2020-11-14 | Disposition: A | Discharge status: Home / Self Care | Payer: Medicaid Other | Attending: Obstetrics and Gynecology | Admitting: Obstetrics and Gynecology

## 2020-11-14 ENCOUNTER — Other Ambulatory Visit: Payer: Self-pay

## 2020-11-14 DIAGNOSIS — Z3A35 35 weeks gestation of pregnancy: Secondary | ICD-10-CM | POA: Diagnosis not present

## 2020-11-14 DIAGNOSIS — R102 Pelvic and perineal pain: Secondary | ICD-10-CM | POA: Insufficient documentation

## 2020-11-14 DIAGNOSIS — O26893 Other specified pregnancy related conditions, third trimester: Secondary | ICD-10-CM | POA: Insufficient documentation

## 2020-11-14 DIAGNOSIS — O365930 Maternal care for other known or suspected poor fetal growth, third trimester, not applicable or unspecified: Secondary | ICD-10-CM | POA: Diagnosis not present

## 2020-11-14 DIAGNOSIS — M25559 Pain in unspecified hip: Secondary | ICD-10-CM | POA: Insufficient documentation

## 2020-11-14 DIAGNOSIS — O368130 Decreased fetal movements, third trimester, not applicable or unspecified: Secondary | ICD-10-CM | POA: Diagnosis present

## 2020-11-14 DIAGNOSIS — O09213 Supervision of pregnancy with history of pre-term labor, third trimester: Secondary | ICD-10-CM | POA: Insufficient documentation

## 2020-11-14 DIAGNOSIS — M7918 Myalgia, other site: Secondary | ICD-10-CM | POA: Diagnosis present

## 2020-11-14 DIAGNOSIS — O99891 Other specified diseases and conditions complicating pregnancy: Secondary | ICD-10-CM

## 2020-11-14 DIAGNOSIS — O365990 Maternal care for other known or suspected poor fetal growth, unspecified trimester, not applicable or unspecified: Secondary | ICD-10-CM

## 2020-11-14 MED ORDER — CYCLOBENZAPRINE HCL 10 MG PO TABS: 10.0000 mg | ORAL_TABLET | Freq: Three times a day (TID) | ORAL | 1 refills | Status: DC | PRN

## 2020-11-14 MED ORDER — CYCLOBENZAPRINE HCL 5 MG PO TABS
10.0000 mg | ORAL_TABLET | Freq: Once | ORAL | Status: AC
  Administered 2020-11-14: 10 mg via ORAL
  Filled 2020-11-14: qty 2

## 2020-11-14 NOTE — MAU Provider Note (Signed)
Chief Complaint:  Decreased Fetal Movement and Hip Pain   Event Date/Time   First Provider Initiated Contact with Patient 11/14/20 1942      HPI: Heidi Martin is a 20 y.o. G2P0010 at [redacted]w[redacted]d who presents to maternity admissions reporting pelvic/hip pain that is severe, making it hard to walk. She has hx preterm labor with admission during this pregnancy and has been 3 cm at recent exams.  She was not feeling good fetal movement at home but is feeling more movement since arriving in MAU.      Location: low abdomen/pubic bone Quality: sharp Severity: 10/10 on pain scale Duration: 2-3 weeks, worsening today Timing: intermittent, irregular Modifying factors: walking, getting out of bed Associated signs and symptoms: none  HPI  Past Medical History: Past Medical History:  Diagnosis Date  . COVID-19   . Irritable bowel syndrome (IBS)   . Sickle cell trait (HCC)     Past obstetric history: OB History  Gravida Para Term Preterm AB Living  2       1    SAB IAB Ectopic Multiple Live Births  0 1          # Outcome Date GA Lbr Len/2nd Weight Sex Delivery Anes PTL Lv  2 Current           1 IAB 2021            Past Surgical History: Past Surgical History:  Procedure Laterality Date  . INDUCED ABORTION    . NO PAST SURGERIES      Family History: Family History  Problem Relation Age of Onset  . Cancer Mother   . Depression Mother   . Miscarriages / [place] Mother   . Hypertension Mother   . Hypertension Father   . Cancer Maternal Grandmother   . Diabetes Maternal Grandmother   . Miscarriages / Stillbirths Maternal Grandmother     Social History: Social History   Tobacco Use  . Smoking status: Never Smoker  . Smokeless tobacco: Never Used  Vaping Use  . Vaping Use: Never used  Substance Use Topics  . Alcohol use: No  . Drug use: No    Allergies:  Allergies  Allergen Reactions  . Milk [Lac Bovis] Nausea And Vomiting    Stomach cramping    Meds:  No  medications prior to admission.    ROS:  Review of Systems  Constitutional: Negative for chills, fatigue and fever.  Eyes: Negative for visual disturbance.  Respiratory: Negative for shortness of breath.   Cardiovascular: Negative for chest pain.  Gastrointestinal: Positive for abdominal pain. Negative for nausea and vomiting.  Genitourinary: Positive for pelvic pain. Negative for difficulty urinating, dysuria, flank pain, vaginal bleeding, vaginal discharge and vaginal pain.  Neurological: Negative for dizziness and headaches.  Psychiatric/Behavioral: Negative.      I have reviewed patient's Past Medical Hx, Surgical Hx, Family Hx, Social Hx, medications and allergies.   Physical Exam   Patient Vitals for the past 24 hrs:  BP Temp Temp src Pulse Resp SpO2 Height Weight  11/14/20 2038 103/65 -- Oral 86 18 98 % -- --  11/14/20 1924 (!) 94/55 98 F (36.7 C) Oral 92 15 98 % 5\' 7"  (1.702 m) 77.1 kg   Constitutional: Well-developed, well-nourished female in no acute distress.  Cardiovascular: normal rate Respiratory: normal effort GI: Abd soft, non-tender, gravid appropriate for gestational age.  MS: Extremities nontender, no edema, normal ROM Neurologic: Alert and oriented x 4.  GU: Neg  CVAT.  PELVIC EXAM: Cervix pink, visually closed, without lesion, scant white creamy discharge, vaginal walls and external genitalia normal Bimanual exam: Cervix 0/long/high, firm, anterior, neg CMT, uterus nontender, nonenlarged, adnexa without tenderness, enlargement, or mass  Dilation: 3 Effacement (%): 70 Cervical Position: Posterior Station: -2 Exam by:: Sharen Counter, CNM  FHT:  Baseline 150 , moderate variability, accelerations present, no decelerations Contractions: 2-10 min, mild to palpation   Labs: No results found for this or any previous visit (from the past 24 hour(s)). --/--/O POS (04/10 1320)  Imaging:    MAU Course/MDM: Orders Placed This Encounter  Procedures   . Ambulatory referral to Physical Therapy  . Discharge patient    Meds ordered this encounter  Medications  . cyclobenzaprine (FLEXERIL) tablet 10 mg  . cyclobenzaprine (FLEXERIL) 10 MG tablet    Sig: Take 1 tablet (10 mg total) by mouth 3 (three) times daily as needed for muscle spasms.    Dispense:  20 tablet    Refill:  1    Order Specific Question:   Supervising Provider    Answer:   CONSTANT, PEGGY [[id number]]     NST reviewed and reactive, pt feeling normal fetal movement in MAU Cervix 3/70/-2, unchanged from previous exams, contractions irregular, mild to palpation so no evidence of preterm labor Pain is with lifting of legs, walking, certain positions, most consistent with pubic symphysis pain/pelvic girdle pain Discussed diagnosis with pt, recommend pregnancy support belt, ice, rest, physical therapy and referral made Flexeril 10 mg PO given in MAU, Rx renewed for Flexeril at home Pt to f/u in office as scheduled, return to MAU as needed for signs of labor or emergencies  Assessment: 1. Pain in symphysis pubis during pregnancy   2. Pelvic pain affecting pregnancy in third trimester, antepartum   3. [redacted] weeks gestation of pregnancy   4. Pregnancy affected by fetal growth restriction   5. Decreased fetal movements in third trimester, single or unspecified fetus     Plan: Discharge home Labor precautions and fetal kick counts  Follow-up Information    [facility] Follow up in 1 day(s).   Specialty: Obstetrics and Gynecology Why: Follow up in 1-2 weeks in the office Contact information: [address] [place] [address] [phone]             Allergies as of 11/14/2020      Reactions   Milk [lac Bovis] Nausea And Vomiting   Stomach cramping      Medication List    TAKE these medications   acetaminophen 500 MG tablet Commonly known as: TYLENOL Take 2 tablets (1,000 mg total) by mouth  every 8 (eight) hours as needed.   cyclobenzaprine 10 MG tablet Commonly known as: FLEXERIL Take 1 tablet (10 mg total) by mouth 3 (three) times daily as needed for muscle spasms. What changed: when to take this   docusate sodium 100 MG capsule Commonly known as: Stool Softener Take 1 capsule (100 mg total) by mouth in the morning, at noon, and at bedtime.   ferrous sulfate 325 (65 FE) MG EC tablet Take 1 tablet (325 mg total) by mouth every other day.   Flintstones Complete 18 MG Chew Chew 1 tablet by mouth daily.   hydrOXYzine 25 MG tablet Commonly known as: ATARAX/VISTARIL Take 1 tablet (25 mg total) by mouth every 6 (six) hours as needed for itching.   metroNIDAZOLE 500 MG tablet Commonly known as: FLAGYL  Take 1 tablet (500 mg total) by mouth 2 (two) times daily.   NIFEdipine 10 MG capsule Commonly known as: PROCARDIA Take 1 capsule (10 mg total) by mouth every 6 (six) hours.   ondansetron 8 MG disintegrating tablet Commonly known as: Zofran ODT Take 1 tablet (8 mg total) by mouth every 8 (eight) hours as needed for nausea or vomiting.   prenatal multivitamin Tabs tablet Take 1 tablet by mouth daily at 12 noon.   progesterone 200 MG capsule Commonly known as: PROMETRIUM Place 200 mg vaginally at bedtime.       Sharen Counter Certified Nurse-Midwife 11/14/2020 9:21 PM

## 2020-11-14 NOTE — MAU Note (Signed)
Pt reports bilateral hip pain over the last few days, worsening since last night. Also reports decreased fetal movement today but is feeling movement now. Denies bleeding.

## 2020-11-14 NOTE — Discharge Instructions (Signed)
Source: [facility]  Seven Things to Know About the Pain from Pubic Symphysis By: Jeananne Rama, PT, DPT     Today we welcome guest writer, Jeananne Rama, PT,  DPT, a physical therapist specializing in pelvic floor health for the childbearing year.  I am grateful that the topic of pelvic floor health during pregnancy and postpartum is being discussed, as many people suffer in silence and without helpful resources. Greggory Stallion,  Medical sales representative, [employer]  Introduction Joint pain is a very common physical challenge resulting from pregnancy. In particular, pelvic joint pain affects 16 to 25% of pregnancies, with onset anywhere from the first to third trimester (Kanakaris, 2011). This article will give you answers to the seven most common questions childbirth educators get about pain in the pubic symphysis--the joint at the very front of the pelvis that expands as pregnancy progresses.    1. What does pubic symphysis pain feel like? Sometimes pubic symphysis pain can feel like a slight pinch or ache. Other times it hurts so much someone will not want to walk. In certain cases, the pain will not be over the pubic symphysis, but in the creases of the groins or along the inner thighs. Occasionally, pain is only felt on one side of the body. Pubic symphysis joint pain is commonly provoked by moving the legs apart, such as getting in and out of a car, climbing out of bed, rolling in bed, or going up and down stairs. Standing up from prolonged sitting, particularly on a soft couch, is another known trigger.  2. Do I need to tell my midwife or doctor about my pain? Yes! Any time you have notable pain, you should be directed to a knowledgeable medical provider for diagnosis. The medical term for problematic pubic symphysis pain is symphysis pubis dysfunction (SPD).  3. Is pubic symphysis pain treatable? Yes! There are a number of at-home treatment options including rest, bracing, and following  self-help tips. Many people with pubic symphysis joint pain in pregnancy also benefit from physical therapy, massage therapy, acupuncture, or chiropractic care. Here's a list of self-help tips:   Walking: Walk with shorter steps to decrease pubic symphysis irritation. When carrying items, wear them in a backpack or hold them close to the chest to avoid uneven loading of the pelvis causing pain.    Sleep: Keep a pillow between the legs that spans from the knees to the ankles (not just between the knees).   Rolling over in bed: To go from one side to the other, (1) bend knees to 90 degrees, (2) tense the belly gently for stability (sometimes doing a Kegel helps too!), and (3) roll with knees, hips, and shoulders in line like a log. Avoid flopping side to side in bed or letting the spine twist.   Dressing: Sit down on the edge of the bed or on a chair to get dressed. Standing on one leg can irritate the pubic symphysis.   Stairs: Face the railing and try going up stairs stepping to the side. Alternatively, take the steps one at a time to avoid a large movement with the pelvis.   Car: When getting in a car, turn their back to the seat and aim their bottom in. Once seated, keep the knees close together as they slowly turn to face forward. Getting out is the same process in reverse. Keeping the knees close together helps prevent the pubic symphysis from feeling pulled on.   Sitting: Rest as evenly as  possible on their sit bones. Consider putting a pillow behind the back for support. Avoid slumping or leaning on one sit bone more than the other. Sitting on a large exercise ball (about 65 cm) may offer relief of pain. Avoid sitting cross legged on the floor.   Sexual activities: Aim to keep the knees together as much as possible. This may require some creativity for positioning (think spooning or using a chair).   4. Are support belts a good idea?  Most, but not all, people with SPD feel less pain and  have an easier time walking while wearing a support belt. One of the most common support belts for SPD treatment is the Serola Sacroiliac Belt. Another common support belt is the Upsie Belly. Many support belts can be purchased using a Health Savings Account (HSA) or Flexible Health Savings (FSA). Some people use a support belt just when they need more support, such as for a walk around the neighborhood. Other people wear the belt 23 hours per day, just taking it off to shower. Many people take them off for sitting. Sometimes people find that they need help from a physical therapist or chiropractor to adjust the pubic bones before splinting them with a brace.   5. What should I expect during birth? It is safe to labor with pain in the pubic symphysis joints, and this pain does not change the physiological process of birth. However, many birthing people find it helpful to adjust their positioning in labor and delivery to manage discomfort in the pubic symphysis joint. While going through contractions, positions that take weight off the pelvis may feel better. These positions include resting the upper body on an exercise ball, slow dance, being in a tub or pool, and sidelying. During pushing, hands and knees and sidelying are frequently more comfortable positions. Common positions to avoid if possible include one or both knees to chest, lunging, and climbing stairs.   6. What should I expect after I give birth?  Right after birth, the pubic symphysis pain may suddenly go away or it may feel worse from the physical rigors of labor. The first few days postpartum are often the most challenging, and a walker or wheelchair may be needed temporarily. Wearing a support belt is often helpful. If SPD is treated, most people get back to an active lifestyle by three to four months postpartum.   7. What about a future pregnancy? Joint pain that occurs during one pregnancy is worse during a subsequent pregnancy, unless  the underlying risk factors are addressed (Kanakaris, 2011). Early use of interventions to manage pain, such as a support belt or targeted exercise, can lead to a more satisfying pregnancy experience overall. On occasion, a person who experienced a traumatic pubic symphysis injury during birth will choose an elective cesarean for the following birth. A cesarean is not required in these cases, but it can help the birthing person to feel more in control and to avoid a triggering experience.     Resources Websites   [facility] (https://www.babycentre.co.uk/a546492/pelvic-pain-in-pregnancy-spd)  [place]'s National Health Service (https://www.nhs.uk/pregnancy/related-conditions/common-symptoms/pelvic-pain/)  At [facility], we have pelvic floor/female physical therapy specialists available. Ask your midwife or doctor.    Or here are other sites to search for therapists:   American Physical Therapy Associations (https://ptl.womenshealthapta.org).   Global Pelvic Health Alliance (https://pelvicguru.com)  Kanakaris, N. K., Roberts, C. S., & Giannoudis, P. V. (2011). Pregnancy-related pelvic girdle pain: an update. BMC Medicine, 9(15). https://doi.org/10.1186/1741-7014-03-17  So  

## 2020-11-17 ENCOUNTER — Other Ambulatory Visit: Payer: Self-pay | Admitting: Student

## 2020-11-17 ENCOUNTER — Encounter: Payer: Self-pay | Admitting: *Deleted

## 2020-11-17 ENCOUNTER — Other Ambulatory Visit: Payer: Self-pay

## 2020-11-17 ENCOUNTER — Ambulatory Visit: Payer: Medicaid Other | Admitting: *Deleted

## 2020-11-17 ENCOUNTER — Ambulatory Visit: Payer: Medicaid Other | Attending: Student

## 2020-11-17 DIAGNOSIS — Z3A36 36 weeks gestation of pregnancy: Secondary | ICD-10-CM | POA: Insufficient documentation

## 2020-11-17 DIAGNOSIS — Z3480 Encounter for supervision of other normal pregnancy, unspecified trimester: Secondary | ICD-10-CM

## 2020-11-17 DIAGNOSIS — Z3A33 33 weeks gestation of pregnancy: Secondary | ICD-10-CM

## 2020-11-17 DIAGNOSIS — O26873 Cervical shortening, third trimester: Secondary | ICD-10-CM

## 2020-11-17 DIAGNOSIS — O365930 Maternal care for other known or suspected poor fetal growth, third trimester, not applicable or unspecified: Secondary | ICD-10-CM | POA: Diagnosis not present

## 2020-11-17 DIAGNOSIS — Z148 Genetic carrier of other disease: Secondary | ICD-10-CM

## 2020-11-17 DIAGNOSIS — Z862 Personal history of diseases of the blood and blood-forming organs and certain disorders involving the immune mechanism: Secondary | ICD-10-CM

## 2020-11-18 ENCOUNTER — Other Ambulatory Visit: Payer: Self-pay | Admitting: Maternal & Fetal Medicine

## 2020-11-18 DIAGNOSIS — O365930 Maternal care for other known or suspected poor fetal growth, third trimester, not applicable or unspecified: Secondary | ICD-10-CM

## 2020-11-22 ENCOUNTER — Other Ambulatory Visit: Payer: Self-pay

## 2020-11-22 ENCOUNTER — Ambulatory Visit (INDEPENDENT_AMBULATORY_CARE_PROVIDER_SITE_OTHER): Payer: Medicaid Other | Admitting: Obstetrics and Gynecology

## 2020-11-22 ENCOUNTER — Encounter: Payer: Self-pay | Admitting: Obstetrics and Gynecology

## 2020-11-22 VITALS — BP 119/67 | HR 89 | Wt 174.1 lb

## 2020-11-22 DIAGNOSIS — Z3480 Encounter for supervision of other normal pregnancy, unspecified trimester: Secondary | ICD-10-CM

## 2020-11-22 DIAGNOSIS — B951 Streptococcus, group B, as the cause of diseases classified elsewhere: Secondary | ICD-10-CM

## 2020-11-22 DIAGNOSIS — O365990 Maternal care for other known or suspected poor fetal growth, unspecified trimester, not applicable or unspecified: Secondary | ICD-10-CM

## 2020-11-22 MED ORDER — PANTOPRAZOLE SODIUM 20 MG PO TBEC: 20.0000 mg | DELAYED_RELEASE_TABLET | Freq: Every day | ORAL | 1 refills | Status: DC

## 2020-11-22 NOTE — Progress Notes (Signed)
Subjective:  Heidi Martin is a 20 y.o. G2P0010 at [redacted]w[redacted]d being seen today for ongoing prenatal care.  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  Patient reports heartburn.  Contractions: Not present. Vag. Bleeding: None.  Movement: Present. Denies leaking of fluid.   The following portions of the patient's history were reviewed and updated as appropriate: allergies, current medications, past family history, past medical history, past social history, past surgical history and problem list. Problem list updated.  Objective:   Vitals:   11/22/20 1549  BP: 119/67  Pulse: 89  Weight: 174 lb 1.6 oz (79 kg)    Fetal Status: Fetal Heart Rate (bpm): 155   Movement: Present     General:  Alert, oriented and cooperative. Patient is in no acute distress.  Skin: Skin is warm and dry. No rash noted.   Cardiovascular: Normal heart rate noted  Respiratory: Normal respiratory effort, no problems with respiration noted  Abdomen: Soft, gravid, appropriate for gestational age. Pain/Pressure: Present     Pelvic:  Cervical exam deferred        Extremities: Normal range of motion.  Edema: Trace  Mental Status: Normal mood and affect. Normal behavior. Normal judgment and thought content.   Urinalysis:      Assessment and Plan:  Pregnancy: G2P0010 at [redacted]w[redacted]d  1. Supervision of other normal pregnancy, antepartum Stable Labor precautions  2. Positive GBS test Tx while in labor  3. Pregnancy affected by fetal growth restriction Continue with antenatal testing as per MFM IOL at 38 weeks, scheduled  4. Preterm labor in third  trimester without delivery Continue with Prometrium until 37 weeks and then Procardia PRN  Term labor symptoms and general obstetric precautions including but not limited to vaginal bleeding, contractions, leaking of fluid and fetal movement were reviewed in detail with the patient. Please refer to After Visit Summary for other counseling recommendations.  Return in about 1 week (around 11/29/2020) for OB visit, face to face, MD only.   Hermina Staggers, MD

## 2020-11-22 NOTE — Patient Instructions (Signed)

## 2020-11-23 ENCOUNTER — Other Ambulatory Visit: Payer: Self-pay | Admitting: Advanced Practice Midwife

## 2020-11-24 ENCOUNTER — Encounter: Payer: Self-pay | Admitting: *Deleted

## 2020-11-24 ENCOUNTER — Encounter (HOSPITAL_COMMUNITY): Payer: Self-pay | Admitting: Obstetrics & Gynecology

## 2020-11-24 ENCOUNTER — Other Ambulatory Visit: Payer: Self-pay

## 2020-11-24 ENCOUNTER — Ambulatory Visit: Payer: Medicaid Other | Admitting: *Deleted

## 2020-11-24 ENCOUNTER — Telehealth: Payer: Self-pay | Admitting: *Deleted

## 2020-11-24 ENCOUNTER — Ambulatory Visit: Payer: Medicaid Other | Attending: Obstetrics

## 2020-11-24 ENCOUNTER — Inpatient Hospital Stay (HOSPITAL_COMMUNITY)
Admission: AD | Admit: 2020-11-24 | Discharge: 2020-11-24 | Disposition: A | Discharge status: Home / Self Care | Payer: Medicaid Other | Attending: Obstetrics & Gynecology | Admitting: Obstetrics & Gynecology

## 2020-11-24 DIAGNOSIS — Z3480 Encounter for supervision of other normal pregnancy, unspecified trimester: Secondary | ICD-10-CM | POA: Insufficient documentation

## 2020-11-24 DIAGNOSIS — O471 False labor at or after 37 completed weeks of gestation: Secondary | ICD-10-CM | POA: Diagnosis present

## 2020-11-24 DIAGNOSIS — O479 False labor, unspecified: Secondary | ICD-10-CM

## 2020-11-24 DIAGNOSIS — Z3A37 37 weeks gestation of pregnancy: Secondary | ICD-10-CM | POA: Insufficient documentation

## 2020-11-24 DIAGNOSIS — O365930 Maternal care for other known or suspected poor fetal growth, third trimester, not applicable or unspecified: Secondary | ICD-10-CM | POA: Insufficient documentation

## 2020-11-24 NOTE — MAU Note (Addendum)
...  Heidi Martin is a 20 y.o. at [redacted]w[redacted]d here in MAU reporting: CTX that began at 1100 this morning. Unable to state how far apart they are. Patient states, "I can deal with them I just can't deal with the coming and going." Patient states she had two teeth removed at 0900 this morning. No pain medication since being sent home from procedure. +FM. No VB or LOF.    Pain score: 10/10 lower abdominal FHT: 125 external

## 2020-11-24 NOTE — MAU Note (Signed)
I have communicated with Dr. Darvin Neighbours, MD and reviewed vital signs:  Vitals:   11/24/20 1302 11/24/20 1414  BP: 121/78 116/71  Pulse: 81 75  Resp: 16   Temp: (!) 97.4 F (36.3 C)   SpO2: 100%     Vaginal exam:  Dilation: 3 Effacement (%): 80 Cervical Position: Middle Station: -2 Presentation: Vertex Exam by:: Erle Crocker, RN,   Also reviewed contraction pattern and that non-stress test is reactive.  It has been documented that patient only felt one contraction when being placed on the monitor. Patient denies any contractions currently. Patient has had no cervical change over 1 hour not indicating active labor.  Patient denies any other complaints.  Based on this report provider has given order for discharge.  A discharge order and diagnosis entered by a provider.   Labor discharge instructions reviewed with patient.

## 2020-11-24 NOTE — Discharge Instructions (Signed)

## 2020-11-24 NOTE — Telephone Encounter (Signed)
I called Heidi Martin as requested per her MyChart message and left a message I was calling as she requested and if she still needs assist to call us or send detailed MyChart message. Heidi Bertran,RN

## 2020-11-30 ENCOUNTER — Encounter (HOSPITAL_COMMUNITY): Payer: Self-pay | Admitting: Obstetrics and Gynecology

## 2020-11-30 ENCOUNTER — Inpatient Hospital Stay (HOSPITAL_COMMUNITY): Payer: Medicaid Other | Admitting: Anesthesiology

## 2020-11-30 ENCOUNTER — Other Ambulatory Visit: Payer: Self-pay

## 2020-11-30 ENCOUNTER — Inpatient Hospital Stay (HOSPITAL_COMMUNITY)
Admission: AD | Admit: 2020-11-30 | Discharge: 2020-12-01 | DRG: 807 | Disposition: A | Discharge status: Home / Self Care | Payer: Medicaid Other | Attending: Family Medicine | Admitting: Family Medicine

## 2020-11-30 ENCOUNTER — Inpatient Hospital Stay (HOSPITAL_COMMUNITY): Payer: Medicaid Other

## 2020-11-30 DIAGNOSIS — O9902 Anemia complicating childbirth: Secondary | ICD-10-CM | POA: Diagnosis present

## 2020-11-30 DIAGNOSIS — O365930 Maternal care for other known or suspected poor fetal growth, third trimester, not applicable or unspecified: Principal | ICD-10-CM

## 2020-11-30 DIAGNOSIS — D573 Sickle-cell trait: Secondary | ICD-10-CM | POA: Diagnosis present

## 2020-11-30 DIAGNOSIS — Z3A38 38 weeks gestation of pregnancy: Secondary | ICD-10-CM

## 2020-11-30 DIAGNOSIS — O99824 Streptococcus B carrier state complicating childbirth: Secondary | ICD-10-CM | POA: Diagnosis present

## 2020-11-30 DIAGNOSIS — B951 Streptococcus, group B, as the cause of diseases classified elsewhere: Secondary | ICD-10-CM | POA: Diagnosis present

## 2020-11-30 DIAGNOSIS — Z20822 Contact with and (suspected) exposure to covid-19: Secondary | ICD-10-CM | POA: Diagnosis present

## 2020-11-30 DIAGNOSIS — O365990 Maternal care for other known or suspected poor fetal growth, unspecified trimester, not applicable or unspecified: Secondary | ICD-10-CM | POA: Diagnosis present

## 2020-11-30 LAB — CBC
HCT: 33.5 % — ABNORMAL LOW (ref 36.0–46.0)
Hemoglobin: 11.2 g/dL — ABNORMAL LOW (ref 12.0–15.0)
MCH: 26.7 pg (ref 26.0–34.0)
MCHC: 33.4 g/dL (ref 30.0–36.0)
MCV: 80 fL (ref 80.0–100.0)
Platelets: 298 10*3/uL (ref 150–400)
RBC: 4.19 MIL/uL (ref 3.87–5.11)
RDW: 14.3 % (ref 11.5–15.5)
WBC: 16.3 10*3/uL — ABNORMAL HIGH (ref 4.0–10.5)
nRBC: 0 % (ref 0.0–0.2)

## 2020-11-30 LAB — TYPE AND SCREEN
ABO/RH(D): O POS
Antibody Screen: NEGATIVE

## 2020-11-30 LAB — RESP PANEL BY RT-PCR (FLU A&B, COVID) ARPGX2
Influenza A by PCR: NEGATIVE
Influenza B by PCR: NEGATIVE
SARS Coronavirus 2 by RT PCR: NEGATIVE

## 2020-11-30 LAB — RPR: RPR Ser Ql: NONREACTIVE

## 2020-11-30 MED ORDER — SENNOSIDES-DOCUSATE SODIUM 8.6-50 MG PO TABS
2.0000 | ORAL_TABLET | Freq: Every day | ORAL | Status: DC
  Administered 2020-12-01: 2 via ORAL
  Filled 2020-11-30: qty 2

## 2020-11-30 MED ORDER — DIPHENHYDRAMINE HCL 25 MG PO CAPS: 25.0000 mg | ORAL_CAPSULE | Freq: Four times a day (QID) | ORAL | Status: DC | PRN

## 2020-11-30 MED ORDER — FENTANYL-BUPIVACAINE-NACL 0.5-0.125-0.9 MG/250ML-% EP SOLN
12.0000 mL/h | EPIDURAL | Status: DC | PRN
  Administered 2020-11-30: 12 mL/h via EPIDURAL
  Filled 2020-11-30: qty 250

## 2020-11-30 MED ORDER — LACTATED RINGERS IV SOLN: 500.0000 mL | Freq: Once | INTRAVENOUS | Status: DC

## 2020-11-30 MED ORDER — TETANUS-DIPHTH-ACELL PERTUSSIS 5-2.5-18.5 LF-MCG/0.5 IM SUSY: 0.5000 mL | PREFILLED_SYRINGE | Freq: Once | INTRAMUSCULAR | Status: DC

## 2020-11-30 MED ORDER — ONDANSETRON HCL 4 MG/2ML IJ SOLN
4.0000 mg | Freq: Four times a day (QID) | INTRAMUSCULAR | Status: DC | PRN
  Administered 2020-11-30: 4 mg via INTRAVENOUS
  Filled 2020-11-30: qty 2

## 2020-11-30 MED ORDER — OXYTOCIN-SODIUM CHLORIDE 30-0.9 UT/500ML-% IV SOLN
1.0000 m[IU]/min | INTRAVENOUS | Status: DC
  Administered 2020-11-30: 2 m[IU]/min via INTRAVENOUS
  Filled 2020-11-30: qty 500

## 2020-11-30 MED ORDER — ZOLPIDEM TARTRATE 5 MG PO TABS: 5.0000 mg | ORAL_TABLET | Freq: Every evening | ORAL | Status: DC | PRN

## 2020-11-30 MED ORDER — ONDANSETRON HCL 4 MG/2ML IJ SOLN: 4.0000 mg | INTRAMUSCULAR | Status: DC | PRN

## 2020-11-30 MED ORDER — OXYTOCIN BOLUS FROM INFUSION: 333.0000 mL | Freq: Once | INTRAVENOUS | Status: DC

## 2020-11-30 MED ORDER — COCONUT OIL OIL: 1.0000 "application " | TOPICAL_OIL | Status: DC | PRN

## 2020-11-30 MED ORDER — OXYTOCIN-SODIUM CHLORIDE 30-0.9 UT/500ML-% IV SOLN: 2.5000 [IU]/h | INTRAVENOUS | Status: DC

## 2020-11-30 MED ORDER — PHENYLEPHRINE 40 MCG/ML (10ML) SYRINGE FOR IV PUSH (FOR BLOOD PRESSURE SUPPORT): 80.0000 ug | PREFILLED_SYRINGE | INTRAVENOUS | Status: DC | PRN

## 2020-11-30 MED ORDER — MEDROXYPROGESTERONE ACETATE 150 MG/ML IM SUSP: 150.0000 mg | INTRAMUSCULAR | Status: DC | PRN

## 2020-11-30 MED ORDER — PRENATAL MULTIVITAMIN CH
1.0000 | ORAL_TABLET | Freq: Every day | ORAL | Status: DC
  Administered 2020-11-30 – 2020-12-01 (×2): 1 via ORAL
  Filled 2020-11-30 (×2): qty 1

## 2020-11-30 MED ORDER — OXYCODONE-ACETAMINOPHEN 5-325 MG PO TABS
2.0000 | ORAL_TABLET | ORAL | Status: DC | PRN
Start: 2020-11-30 — End: 2020-11-30

## 2020-11-30 MED ORDER — ACETAMINOPHEN 325 MG PO TABS
650.0000 mg | ORAL_TABLET | ORAL | Status: DC | PRN
  Administered 2020-11-30: 650 mg via ORAL
  Filled 2020-11-30: qty 2

## 2020-11-30 MED ORDER — DIPHENHYDRAMINE HCL 50 MG/ML IJ SOLN: 12.5000 mg | INTRAMUSCULAR | Status: DC | PRN

## 2020-11-30 MED ORDER — TERBUTALINE SULFATE 1 MG/ML IJ SOLN: 0.2500 mg | Freq: Once | INTRAMUSCULAR | Status: DC | PRN

## 2020-11-30 MED ORDER — SODIUM CHLORIDE 0.9 % IV SOLN
5.0000 10*6.[IU] | Freq: Once | INTRAVENOUS | Status: AC
  Administered 2020-11-30: 5 10*6.[IU] via INTRAVENOUS
  Filled 2020-11-30: qty 5

## 2020-11-30 MED ORDER — SIMETHICONE 80 MG PO CHEW: 80.0000 mg | CHEWABLE_TABLET | ORAL | Status: DC | PRN

## 2020-11-30 MED ORDER — BENZOCAINE-MENTHOL 20-0.5 % EX AERO: 1.0000 "application " | INHALATION_SPRAY | CUTANEOUS | Status: DC | PRN

## 2020-11-30 MED ORDER — EPHEDRINE 5 MG/ML INJ: 10.0000 mg | INTRAVENOUS | Status: DC | PRN

## 2020-11-30 MED ORDER — ONDANSETRON HCL 4 MG PO TABS: 4.0000 mg | ORAL_TABLET | ORAL | Status: DC | PRN

## 2020-11-30 MED ORDER — IBUPROFEN 600 MG PO TABS
600.0000 mg | ORAL_TABLET | Freq: Four times a day (QID) | ORAL | Status: DC
  Administered 2020-11-30 – 2020-12-01 (×5): 600 mg via ORAL
  Filled 2020-11-30 (×5): qty 1

## 2020-11-30 MED ORDER — LACTATED RINGERS IV SOLN
500.0000 mL | INTRAVENOUS | Status: DC | PRN
  Administered 2020-11-30: 500 mL via INTRAVENOUS

## 2020-11-30 MED ORDER — SOD CITRATE-CITRIC ACID 500-334 MG/5ML PO SOLN: 30.0000 mL | ORAL | Status: DC | PRN

## 2020-11-30 MED ORDER — OXYCODONE-ACETAMINOPHEN 5-325 MG PO TABS: 1.0000 | ORAL_TABLET | ORAL | Status: DC | PRN

## 2020-11-30 MED ORDER — DIBUCAINE (PERIANAL) 1 % EX OINT: 1.0000 "application " | TOPICAL_OINTMENT | CUTANEOUS | Status: DC | PRN

## 2020-11-30 MED ORDER — FENTANYL CITRATE (PF) 100 MCG/2ML IJ SOLN
50.0000 ug | INTRAMUSCULAR | Status: DC | PRN
  Administered 2020-11-30: 100 ug via INTRAVENOUS
  Filled 2020-11-30: qty 2

## 2020-11-30 MED ORDER — PENICILLIN G POT IN DEXTROSE 60000 UNIT/ML IV SOLN
3.0000 10*6.[IU] | INTRAVENOUS | Status: DC
  Administered 2020-11-30: 3 10*6.[IU] via INTRAVENOUS
  Filled 2020-11-30: qty 50

## 2020-11-30 MED ORDER — LIDOCAINE HCL (PF) 1 % IJ SOLN: 30.0000 mL | INTRAMUSCULAR | Status: DC | PRN

## 2020-11-30 MED ORDER — LIDOCAINE-EPINEPHRINE (PF) 2 %-1:200000 IJ SOLN
INTRAMUSCULAR | Status: DC | PRN
  Administered 2020-11-30: 4 mL via EPIDURAL

## 2020-11-30 MED ORDER — ACETAMINOPHEN 325 MG PO TABS
650.0000 mg | ORAL_TABLET | ORAL | Status: DC | PRN
  Administered 2020-11-30 – 2020-12-01 (×2): 650 mg via ORAL
  Filled 2020-11-30 (×2): qty 2

## 2020-11-30 MED ORDER — LACTATED RINGERS IV SOLN: INTRAVENOUS | Status: DC

## 2020-11-30 MED ORDER — WITCH HAZEL-GLYCERIN EX PADS: 1.0000 "application " | MEDICATED_PAD | CUTANEOUS | Status: DC | PRN

## 2020-11-30 NOTE — Anesthesia Procedure Notes (Signed)
Epidural Patient location during procedure: OB Start time: 11/30/2020 5:20 AM End time: 11/30/2020 5:30 AM  Staffing Anesthesiologist: Elmer Picker, MD Performed: anesthesiologist   Preanesthetic Checklist Completed: patient identified, IV checked, risks and benefits discussed, monitors and equipment checked, pre-op evaluation and timeout performed  Epidural Patient position: sitting Prep: DuraPrep and site prepped and draped Patient monitoring: continuous pulse ox, blood pressure, heart rate and cardiac monitor Approach: midline Location: L3-L4 Injection technique: LOR air  Needle:  Needle type: Tuohy  Needle gauge: 17 G Needle length: 9 cm Needle insertion depth: 5.5 cm Catheter type: closed end flexible Catheter size: 19 Gauge Catheter at skin depth: 11 cm Test dose: negative  Assessment Sensory level: T8 Events: blood not aspirated, injection not painful, no injection resistance, no paresthesia and negative IV test  Additional Notes Patient identified. Risks/Benefits/Options discussed with patient including but not limited to bleeding, infection, nerve damage, paralysis, failed block, incomplete pain control, headache, blood pressure changes, nausea, vomiting, reactions to medication both or allergic, itching and postpartum back pain. Confirmed with bedside nurse the patient's most recent platelet count. Confirmed with patient that they are not currently taking any anticoagulation, have any bleeding history or any family history of bleeding disorders. Patient expressed understanding and wished to proceed. All questions were answered. Sterile technique was used throughout the entire procedure. Please see nursing notes for vital signs. Test dose was given through epidural catheter and negative prior to continuing to dose epidural or start infusion. Warning signs of high block given to the patient including shortness of breath, tingling/numbness in hands, complete motor block,  or any concerning symptoms with instructions to call for help. Patient was given instructions on fall risk and not to get out of bed. All questions and concerns addressed with instructions to call with any issues or inadequate analgesia.  Reason for block:procedure for pain

## 2020-11-30 NOTE — H&P (Signed)
OBSTETRIC ADMISSION HISTORY AND PHYSICAL  Heidi Martin is a 20 y.o. female G2P0010 with IUP at [redacted]w[redacted]d by L/8 presenting for IOL secondary to IUGR. She reports +FMs, No LOF, no VB, no blurry vision, headaches or peripheral edema, and RUQ pain.  She plans on breast feeding. She requests depo for birth control. She received her prenatal care at [facility]   Dating: By L/8 --->  Estimated Date of Delivery: 12/14/20  Sono:  @[redacted]w[redacted]d , CWD, normal anatomy, cephalic presentation, 2379g, EFW   Prenatal History/Complications:  - IUGR (diagnosed at [redacted]w[redacted]d based on EFW 10%) - maternal sickle cell trait - short cervix (vaginal progesterone in pregnancy)  Past Medical History: Past Medical History:  Diagnosis Date  . COVID-19   . Irritable bowel syndrome (IBS)   . Sickle cell trait [facility])     Past Surgical History: Past Surgical History:  Procedure Laterality Date  . INDUCED ABORTION    . NO PAST SURGERIES      Obstetrical History: OB History    Gravida  2   Para      Term      Preterm      AB  1   Living        SAB  0   IAB  1   Ectopic      Multiple      Live Births              Social History Social History   Socioeconomic History  . Marital status: Single    Spouse name: Not on file  . Number of children: Not on file  . Years of education: Not on file  . Highest education level: Not on file  Occupational History  . Not on file  Tobacco Use  . Smoking status: Never Smoker  . Smokeless tobacco: Never Used  Vaping Use  . Vaping Use: Never used  Substance and Sexual Activity  . Alcohol use: No  . Drug use: No  . Sexual activity: Not Currently    Birth control/protection: None  Other Topics Concern  . Not on file  Social History Narrative  . Not on file   Social Determinants of Health   Financial Resource Strain: Not on file  Food Insecurity: No Food Insecurity  . Worried About [facility] in the Last Year: Never true  . Ran Out of Food in  the Last Year: Never true  Transportation Needs: Unmet Transportation Needs  . Lack of Transportation (Medical): Yes  . Lack of Transportation (Non-Medical): Yes  Physical Activity: Not on file  Stress: Not on file  Social Connections: Not on file    Family History: Family History  Problem Relation Age of Onset  . Cancer Mother   . Depression Mother   . Miscarriages / Programme researcher, broadcasting/film/video Mother   . Hypertension Mother   . Hypertension Father   . Cancer Maternal Grandmother   . Diabetes Maternal Grandmother   . Miscarriages / Stillbirths Maternal Grandmother     Allergies: Allergies  Allergen Reactions  . Milk [Lac Bovis] Nausea And Vomiting    Stomach cramping    Medications Prior to Admission  Medication Sig Dispense Refill Last Dose  . cyclobenzaprine (FLEXERIL) 10 MG tablet Take 1 tablet (10 mg total) by mouth 3 (three) times daily as needed for muscle spasms. 20 tablet 1   . docusate sodium (STOOL SOFTENER) 100 MG capsule Take 1 capsule (100 mg total) by mouth in the morning, at  noon, and at bedtime. 10 capsule 0   . hydrOXYzine (ATARAX/VISTARIL) 25 MG tablet Take 1 tablet (25 mg total) by mouth every 6 (six) hours as needed for itching. (Patient not taking: Reported on 11/24/2020) 30 tablet 2   . NIFEdipine (PROCARDIA) 10 MG capsule Take 1 capsule (10 mg total) by mouth every 6 (six) hours. 20 capsule 1   . ondansetron (ZOFRAN ODT) 8 MG disintegrating tablet Take 1 tablet (8 mg total) by mouth every 8 (eight) hours as needed for nausea or vomiting. 20 tablet 3   . pantoprazole (PROTONIX) 20 MG tablet Take 1 tablet (20 mg total) by mouth daily. 30 tablet 1   . Pediatric Multivitamins-Iron (FLINTSTONES COMPLETE) 18 MG CHEW Chew 1 tablet by mouth daily. (Patient not taking: No sig reported) 30 tablet 1   . Prenatal Vit-Fe Fumarate-FA (PRENATAL MULTIVITAMIN) TABS tablet Take 1 tablet by mouth daily at 12 noon.     . progesterone (PROMETRIUM) 200 MG capsule Place 200 mg vaginally at  bedtime.        Review of Systems   All systems reviewed and negative except as stated in HPI  Blood pressure 111/75, pulse 96, last menstrual period 03/09/2020, unknown if currently breastfeeding. General appearance: alert, cooperative and appears stated age Lungs: normal WOB Heart: regular rate Abdomen: soft, non-tender Extremities: no sign of DVT Presentation: cephalic Fetal monitoringBaseline: 145 bpm, Variability: Good {> 6 bpm), Accelerations: Reactive and Decelerations: Absent Uterine activityFrequency: Every 2-4 minutes Dilation: 5 Effacement (%): 80 Station: -1 Exam by:: Shadia Larose, MD  Prenatal labs: ABO, Rh: --/--/PENDING (05/31 0045) Antibody: PENDING (05/31 0045) Rubella: 5.09 (12/22 1433) RPR: NON REACTIVE (04/10 1641)  HBsAg: Negative (12/22 1433)  HIV: Non Reactive (04/08 0849)  GBS: POSITIVE/-- (04/10 1447)  2 hr Glucola wnl Genetic screening wnl except for maternal sickle cell trait Anatomy US wnl except for IUGR  Prenatal Transfer Tool  Maternal Diabetes: No Genetic Screening: Normal except for maternal sickle cell trait Maternal Ultrasounds/Referrals: IUGR Fetal Ultrasounds or other Referrals:  Referred to Materal Fetal Medicine  Maternal Substance Abuse:  No Significant Maternal Medications: vaginal progesterone Significant Maternal Lab Results: Group B Strep positive  Results for orders placed or performed during the hospital encounter of 11/30/20 (from the past 24 hour(s))  Type and screen   Collection Time: 11/30/20 12:45 AM  Result Value Ref Range   ABO/RH(D) PENDING    Antibody Screen PENDING    Sample Expiration      12/03/2020,2359 Performed at [facility], [address] [place], [place] [address]     Patient Active Problem List   Diagnosis Date Noted  . IUGR (intrauterine growth restriction) affecting care of mother 11/30/2020  . 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 Pelvic pain affecting pregnancy in third  trimester, antepartum 11/14/2020  . Pregnancy affected by fetal growth restriction 10/26/2020  . Preterm labor 10/10/2020  . Short cervix 07/23/2020  . Positive GBS test 06/27/2020  . Supervision of other normal pregnancy, antepartum 06/23/2020  . History of therapeutic abortion 06/23/2020  . Sickle cell trait (HCC) 05/31/2015    Assessment/Plan:  Heidi Martin is a 20 y.o. G2P0010 at [redacted]w[redacted]d here for IOL secondary to IUGR.  #Labor: Given initial cervical exam will initiate pitocin and plan to recheck cervical exam in 4 hours. #Pain: TBD per pt request #FWB:  Category 1 strip #ID: GBS+ > PCN started on admission #MOF: breast #MOC: depo #Circ: n/a  Sheila Oats, MD  OB Fellow, Faculty Practice 11/30/2020 1:26 AM

## 2020-11-30 NOTE — Discharge Summary (Signed)
Postpartum Discharge Summary    Patient Name: Heidi Martin DOB: [birth year] MRN: [id number]  Date of admission: 11/30/2020 Delivery date:11/30/2020  Delivering provider: Renee Harder  Date of discharge: 12/01/2020  Admitting diagnosis: IUGR (intrauterine growth restriction) affecting care of mother [O36.5990] Intrauterine pregnancy: [redacted]w[redacted]d    Secondary diagnosis:  Active Problems:   Positive GBS test   IUGR (intrauterine growth restriction) affecting care of mother   Vaginal delivery  Additional problems: n/a    Discharge diagnosis: Term Pregnancy Delivered                                              Post partum procedures:n/a Augmentation: AROM and Pitocin Complications: None  Hospital course: Induction of Labor With Vaginal Delivery   20y.o. yo G2P0010 at 375w0das admitted to the hospital 11/30/2020 for induction of labor.  Indication for induction: IUGR.  Patient had an uncomplicated labor course as follows: Membrane Rupture Time/Date: 5:58 AM ,11/30/2020   Delivery Method:Vaginal, Spontaneous  Episiotomy: None  Lacerations:  Labial  Details of delivery can be found in separate delivery note.  Patient had a routine postpartum course. Patient is discharged home 12/01/20.  Newborn Data: Birth date:11/30/2020  Birth time:9:24 AM  Gender:Female  Living status:Living  Apgars:9 ,9  Weight:2605 g   Magnesium Sulfate received: No BMZ received: No Rhophylac:N/A MMR:N/A, immune T-DaP:Given prenatally Flu: N/A Transfusion:No  Physical exam  Vitals:   11/30/20 1600 11/30/20 1945 12/01/20 0000 12/01/20 0526  BP: 104/67 122/65 103/68 (!) 93/59  Pulse: 71 77 69 72  Resp: '16 18 19 18  ' Temp: 97.8 F (36.6 C) (!) 97.3 F (36.3 C) 98 F (36.7 C) 97.8 F (36.6 C)  TempSrc: Oral Oral Oral Oral  SpO2:  100% 98% 95%  Weight:      Height:       General: alert, cooperative and no distress Lochia: appropriate Uterine Fundus: firm Incision: N/A DVT Evaluation: No  evidence of DVT seen on physical exam. Labs: Lab Results  Component Value Date   WBC 16.3 (H) 11/30/2020   HGB 11.2 (L) 11/30/2020   HCT 33.5 (L) 11/30/2020   MCV 80.0 11/30/2020   PLT 298 11/30/2020   CMP Latest Ref Rng & Units 07/24/2020  Glucose 70 - 99 mg/dL 86  BUN 6 - 20 mg/dL 6  Creatinine 0.44 - 1.00 mg/dL 0.64  Sodium 135 - 145 mmol/L 136  Potassium 3.5 - 5.1 mmol/L 4.4  Chloride 98 - 111 mmol/L 108  CO2 22 - 32 mmol/L 17(L)  Calcium 8.9 - 10.3 mg/dL 8.4(L)  Total Protein 6.5 - 8.1 g/dL 5.8(L)  Total Bilirubin 0.3 - 1.2 mg/dL 0.3  Alkaline Phos 38 - 126 U/L 43  AST 15 - 41 U/L 22  ALT 0 - 44 U/L 8   Edinburgh Score: No flowsheet data found.   After visit meds:  Allergies as of 12/01/2020      Reactions   Milk [lac Bovis] Nausea And Vomiting   Stomach cramping      Medication List    STOP taking these medications   cyclobenzaprine 10 MG tablet Commonly known as: FLEXERIL   docusate sodium 100 MG capsule Commonly known as: Stool Softener   Flintstones Complete 18 MG Chew   hydrOXYzine 25 MG tablet Commonly known as: ATARAX/VISTARIL   NIFEdipine 10 MG capsule  Commonly known as: PROCARDIA   ondansetron 8 MG disintegrating tablet Commonly known as: Zofran ODT   pantoprazole 20 MG tablet Commonly known as: Protonix   progesterone 200 MG capsule Commonly known as: PROMETRIUM     TAKE these medications   ibuprofen 600 MG tablet Commonly known as: ADVIL Take 1 tablet (600 mg total) by mouth every 6 (six) hours.   prenatal multivitamin Tabs tablet Take 1 tablet by mouth daily at 12 noon.        Discharge home in stable condition Infant Feeding: Breast Infant Disposition:home with mother Discharge instruction: per After Visit Summary and Postpartum booklet. Activity: Advance as tolerated. Pelvic rest for 6 weeks.  Diet: routine diet Future Appointments:No future appointments. Follow up Visit:  [facility]. Schedule an appointment as soon as possible for a visit in 4 week(s).   Specialty: Obstetrics and Gynecology Contact information: [place] [phone] [phone]               Please schedule this patient for a In person postpartum visit in 6 weeks with the following provider: Any provider. Additional Postpartum F/U:n/a  Low risk pregnancy complicated by: IUGR Delivery mode:  Vaginal, Spontaneous  Anticipated Birth Control:  Depo  PP message sent to [facility] by Maryagnes Amos, CNM   12/01/2020 Hansel Feinstein, CNM

## 2020-11-30 NOTE — Lactation Note (Signed)
Lactation Consultation Note  Patient Name: Heidi Martin UKRCV'K Date: 11/30/2020 Reason for consult: L&D Initial assessment Age:20 y.o.  I conducted L&D lactation visit. Baby was rooting upon entry and STS. I assisted with moving baby to the cradle position on mother's right breast. I showed her how to HE and noted colostrum. Nipples are slightly short, but everted and WNL. Baby began to lick and root. I assisted by sandwiching her areolar tissue to help baby achieve latch. Baby latched with rhythmic suckling sequences.  I educated at the bedside regarding baby's stomach size and intake needs today. I encouraged STS and breast feeding on demand. Cues reviewed. I discussed mother's expected milk volume on days 1-3.  [name] is interested in lactation follow up on MB floor.  Maternal Data Has patient been taught Hand Expression?: Yes Does the patient have breastfeeding experience prior to this delivery?: No   LATCH Score Latch: Grasps breast easily, tongue down, lips flanged, rhythmical sucking.  Audible Swallowing: A few with stimulation  Type of Nipple: Everted at rest and after stimulation  Comfort (Breast/Nipple): Soft / non-tender  Hold (Positioning): Assistance needed to correctly position infant at breast and maintain latch.  LATCH Score: 8   Lactation Tools Discussed/Used    Interventions Interventions: Breast feeding basics reviewed;Assisted with latch;Skin to skin;Hand express;Breast compression;Education  Discharge    Consult Status Consult Status: Follow-up Date: 11/30/20 Follow-up type: In-patient    Walker Shadow 11/30/2020, 10:16 AM

## 2020-11-30 NOTE — Anesthesia Preprocedure Evaluation (Signed)
Anesthesia Evaluation  Patient identified by MRN, date of birth, ID band Patient awake    Reviewed: Allergy & Precautions, NPO status , Patient's Chart, lab work & pertinent test results  Airway Mallampati: II  TM Distance: >3 FB Neck ROM: Full    Dental no notable dental hx.    Pulmonary neg pulmonary ROS,    Pulmonary exam normal breath sounds clear to auscultation       Cardiovascular negative cardio ROS Normal cardiovascular exam Rhythm:Regular Rate:Normal     Neuro/Psych negative neurological ROS  negative psych ROS   GI/Hepatic negative GI ROS, Neg liver ROS,   Endo/Other  negative endocrine ROS  Renal/GU negative Renal ROS  negative genitourinary   Musculoskeletal negative musculoskeletal ROS (+)   Abdominal   Peds  Hematology negative hematology ROS (+) Sickle cell trait ,   Anesthesia Other Findings IOL for IUGR  Reproductive/Obstetrics (+) Pregnancy                             Anesthesia Physical Anesthesia Plan  ASA: II  Anesthesia Plan: Epidural   Post-op Pain Management:    Induction:   PONV Risk Score and Plan: Treatment may vary due to age or medical condition  Airway Management Planned: Natural Airway  Additional Equipment:   Intra-op Plan:   Post-operative Plan:   Informed Consent: I have reviewed the patients History and Physical, chart, labs and discussed the procedure including the risks, benefits and alternatives for the proposed anesthesia with the patient or authorized representative who has indicated his/her understanding and acceptance.       Plan Discussed with: Anesthesiologist  Anesthesia Plan Comments: (Patient identified. Risks, benefits, options discussed with patient including but not limited to bleeding, infection, nerve damage, paralysis, failed block, incomplete pain control, headache, blood pressure changes, nausea, vomiting,  reactions to medication, itching, and post partum back pain. Confirmed with bedside nurse the patient's most recent platelet count. Confirmed with the patient that they are not taking any anticoagulation, have any bleeding history or any family history of bleeding disorders. Patient expressed understanding and wishes to proceed. All questions were answered. )        Anesthesia Quick Evaluation

## 2020-11-30 NOTE — Lactation Note (Signed)
This note was copied from a baby's chart. Lactation Consultation Note LC to room for initial visit. Mother is breast and bottle feeding today. Discouraged formula use unless medically indicated. Reviewed feeding cues and bf norms on day 1 of life. RN present during visit. Mother is aware of LC services. Will plan f/u visit to assist further.   Patient Name: Heidi Martin IRCVE'L Date: 11/30/2020 Reason for consult: Initial assessment;Infant < 6lbs Age:29 hours  Maternal Data Has patient been taught Hand Expression?: Yes Does the patient have breastfeeding experience prior to this delivery?: No  Feeding Mother's Current Feeding Choice: Breast Milk and Formula   Lactation Tools Discussed/Used  Lactation book and booklet   Interventions Interventions: Breast feeding basics reviewed;Education;Expressed milk;Skin to skin   Consult Status Consult Status: Follow-up Follow-up type: In-patient  Heidi Negus, MA IBCLC 11/30/2020, 5:40 PM

## 2020-12-01 ENCOUNTER — Other Ambulatory Visit (HOSPITAL_COMMUNITY): Payer: Self-pay

## 2020-12-01 MED ORDER — IBUPROFEN 600 MG PO TABS: 600.0000 mg | ORAL_TABLET | Freq: Four times a day (QID) | ORAL | 0 refills | Status: DC

## 2020-12-01 MED ORDER — IBUPROFEN 600 MG PO TABS
600.0000 mg | ORAL_TABLET | Freq: Four times a day (QID) | ORAL | 0 refills | Status: DC
  Filled 2020-12-01: qty 30, 8d supply, fill #0

## 2020-12-01 NOTE — Discharge Instructions (Signed)

## 2020-12-01 NOTE — Anesthesia Postprocedure Evaluation (Signed)
Anesthesia Post Note  Patient: Heidi Martin  Procedure(s) Performed: AN AD HOC LABOR EPIDURAL     Patient location during evaluation: Mother Baby Anesthesia Type: Epidural Level of consciousness: awake, awake and alert and oriented Pain management: pain level controlled Vital Signs Assessment: post-procedure vital signs reviewed and stable Respiratory status: spontaneous breathing, nonlabored ventilation and respiratory function stable Cardiovascular status: stable Postop Assessment: no headache, patient able to bend at knees, no apparent nausea or vomiting, adequate PO intake and able to ambulate Anesthetic complications: no   No complications documented.  Last Vitals:  Vitals:   12/01/20 0000 12/01/20 0526  BP: 103/68 (!) 93/59  Pulse: 69 72  Resp: 19 18  Temp: 36.7 C 36.6 C  SpO2: 98% 95%    Last Pain:  Vitals:   12/01/20 0928  TempSrc:   PainSc: 8    Pain Goal:                   Gabryela Kimbrell

## 2020-12-01 NOTE — Lactation Note (Signed)
This note was copied from a baby's chart. Lactation Consultation Note  Patient Name: Heidi Martin WGNFA'O Date: 12/01/2020 Reason for consult: Follow-up assessment;Early term 37-38.6wks;[place]apara Age:20 hours  When I entered room, Mom was laying in bed on her side. I noted that infant was also in the bed laying on its side on pillows facing Mom. I asked Mom if she was sleeping and she said, "I'm trying." I reminded her about safe sleep & asked her not to sleep with infant in bed or with infant on pillows.  Mom said she didn't have any questions for me about breastfeeding and has not been pumping. I reminded Mom that to maximize her potential supply, she should pump every time infant receives formula.  RN was made aware of the above.   Lurline Hare [facility] 12/01/2020, 11:34 AM

## 2020-12-01 NOTE — Progress Notes (Deleted)
POSTPARTUM PROGRESS NOTE  Post Partum Day 1  Subjective:  Heidi Martin is a 20 y.o. G2P1011 s/p spontaneous vaginal delivery at [redacted]w[redacted]d. Patient denies any acute events overnight. Patient denies problems with ambulating, voiding or PO intake.  She denies nausea or vomiting. Pain is well controlled. She has had flatus. She has not had bowel movement as of my encounter with her this morning. She has voided. Her lochia is minimal.   Objective: Blood pressure (!) 93/59, pulse 72, temperature 97.8 F (36.6 C), temperature source Oral, resp. rate 18, height 5\' 7"  (1.702 m), weight 80.1 kg, last menstrual period 03/09/2020, SpO2 95 %, unknown if currently breastfeeding.  Physical Exam:  General: Alert, cooperative and no distress. Chest: No respiratory distress Heart: Regular rate, distal pulses are intact. Abdomen: Soft, non-tender.  Incision: N/A Uterine Fundus: Firm. DVT Evaluation: No calf swelling or tenderness. Extremities: No evidence of DVT. No lower extremity edema or color change. Skin: Warm and dry.  Recent Labs    11/30/20 0100  HGB 11.2*  HCT 33.5*    Assessment/Plan: Heidi Martin is a 20 y.o. G2P1011 s/p spontaneous vaginal delivery at [redacted]w[redacted]d.   Patient is PPD#1 - she is doing well. Pain is under good control.  Contraception: Depo Feeding: Breast and bottle feeding Dispo: Plan for discharge today.   LOS: 1 day   [redacted]w[redacted]d 12/01/2020, 8:31 AM

## 2020-12-02 LAB — SURGICAL PATHOLOGY

## 2020-12-03 ENCOUNTER — Ambulatory Visit: Payer: Medicaid Other

## 2020-12-03 ENCOUNTER — Other Ambulatory Visit: Payer: Self-pay

## 2020-12-14 ENCOUNTER — Inpatient Hospital Stay (HOSPITAL_COMMUNITY): Admit: 2020-12-14 | Payer: Self-pay

## 2021-01-05 ENCOUNTER — Other Ambulatory Visit: Payer: Self-pay

## 2021-01-05 ENCOUNTER — Encounter: Payer: Self-pay | Admitting: Obstetrics & Gynecology

## 2021-01-05 ENCOUNTER — Ambulatory Visit (INDEPENDENT_AMBULATORY_CARE_PROVIDER_SITE_OTHER): Payer: Medicaid Other | Admitting: Obstetrics & Gynecology

## 2021-01-05 VITALS — BP 121/79 | HR 77 | Wt 160.0 lb

## 2021-01-05 DIAGNOSIS — Z3042 Encounter for surveillance of injectable contraceptive: Secondary | ICD-10-CM

## 2021-01-05 MED ORDER — MEDROXYPROGESTERONE ACETATE 150 MG/ML IM SUSP: 150.0000 mg | INTRAMUSCULAR | 3 refills | Status: DC

## 2021-01-05 MED ORDER — MEDROXYPROGESTERONE ACETATE 150 MG/ML IM SUSP
150.0000 mg | Freq: Once | INTRAMUSCULAR | Status: AC
Start: 2021-01-05 — End: 2021-01-05
  Administered 2021-01-05: 150 mg via INTRAMUSCULAR

## 2021-01-05 NOTE — Progress Notes (Signed)
    Post Partum Visit Note  Heidi Martin is a 20 y.o. G62P1011 female who presents for a postpartum visit. She is 5 weeks postpartum following a normal spontaneous vaginal delivery.  I have fully reviewed the prenatal and intrapartum course. The delivery was at 38 gestational weeks.  Anesthesia: epidural. Postpartum course has been good. Baby is doing well . Baby is feeding by bottle - Gerber Soy . Bleeding changing a maxi pad every 2 hours. Bowel function is normal. Bladder function is normal. Patient is not sexually active. Contraception method is Depo-Provera injections. Postpartum depression screening: negative.   The pregnancy intention screening data noted above was reviewed. Potential methods of contraception were discussed. The patient elected to proceed with Hormonal Injection.     Health Maintenance Due  Topic Date Due   Pneumococcal Vaccine 80-10 Years old (1 - PCV) Never done   HPV VACCINES (1 - 2-dose series) Never done   COVID-19 Vaccine (3 - Moderna risk series) 05/12/2020    The following portions of the patient's history were reviewed and updated as appropriate: allergies, current medications, past family history, past medical history, past social history, past surgical history, and problem list.  Review of Systems Pertinent items are noted in HPI.  Objective:  BP 121/79   Pulse 77   Wt 160 lb (72.6 kg)   LMP 03/09/2020 (Exact Date) Comment: neg preg test 03/30/20  BMI 25.06 kg/m    General:  alert, cooperative, and no distress   Breasts:  not indicated  Lungs: Effort is normal  Heart:  RRR  Abdomen: soft, non-tender; bowel sounds normal; no masses,  no organomegaly   Wound N/a  GU exam:  not indicated       Assessment:    There are no diagnoses linked to this encounter.  Normal postpartum exam.   Plan:   Essential components of care per ACOG recommendations:  1.  Mood and well being: Patient with negative depression screening today. Reviewed local  resources for support.  - Patient tobacco use? No.   - hx of drug use? No.    2. Infant care and feeding:  -Patient currently breastmilk feeding? No.  -Social determinants of health (SDOH) reviewed in EPIC. No concerns  3. Sexuality, contraception and birth spacing - Patient does not want a pregnancy in the next year.  Desired family size is 2 children.  - Reviewed forms of contraception in tiered fashion. Patient desired Depo-Provera today.   - Discussed birth spacing of 18 months  4. Sleep and fatigue -Encouraged family/partner/community support of 4 hrs of uninterrupted sleep to help with mood and fatigue  5. Physical Recovery  - Discussed patients delivery and complications. She describes her labor as good. - Patient had a Vaginal, no problems at delivery. Patient had a  labial  laceration. Perineal healing reviewed. Patient expressed understanding - Patient has urinary incontinence? No. - Patient is safe to resume physical and sexual activity  6.  Health Maintenance - HM due items addressed Yes - Last pap smear No results found for: DIAGPAP Pap smear not done at today's visit.  -Breast Cancer screening indicated? No.   7. Chronic Disease/Pregnancy Condition follow up: None  - PCP follow up  Adam Phenix, MD [facility], [facility]

## 2021-01-05 NOTE — Progress Notes (Signed)
Patient is here for post partum care. Heidi Martin stated that she is doing well along with negative score on post-partum screening. Patient did have a question about Depo Provera that she was prescribed from home pharmacy. All questions and concerns were addressed and depo provera 150 mg was administered into left upper outer quadrant without any complications. Next dose will be due between March 23, 2021 and April 06, 2021   Dawayne Patricia, [place]  01/05/21

## 2021-01-31 ENCOUNTER — Other Ambulatory Visit: Payer: Self-pay | Admitting: Obstetrics and Gynecology

## 2021-01-31 DIAGNOSIS — Z3480 Encounter for supervision of other normal pregnancy, unspecified trimester: Secondary | ICD-10-CM

## 2021-02-10 ENCOUNTER — Other Ambulatory Visit: Payer: Self-pay

## 2021-02-10 ENCOUNTER — Emergency Department (HOSPITAL_COMMUNITY)
Admission: EM | Admit: 2021-02-10 | Discharge: 2021-02-11 | Disposition: A | Discharge status: Home / Self Care | Payer: Medicaid Other | Attending: Emergency Medicine | Admitting: Emergency Medicine

## 2021-02-10 ENCOUNTER — Encounter (HOSPITAL_COMMUNITY): Payer: Self-pay | Admitting: Emergency Medicine

## 2021-02-10 DIAGNOSIS — Z20822 Contact with and (suspected) exposure to covid-19: Secondary | ICD-10-CM | POA: Insufficient documentation

## 2021-02-10 DIAGNOSIS — R509 Fever, unspecified: Secondary | ICD-10-CM | POA: Insufficient documentation

## 2021-02-10 DIAGNOSIS — R109 Unspecified abdominal pain: Secondary | ICD-10-CM | POA: Diagnosis not present

## 2021-02-10 DIAGNOSIS — R300 Dysuria: Secondary | ICD-10-CM | POA: Diagnosis not present

## 2021-02-10 DIAGNOSIS — M545 Low back pain, unspecified: Secondary | ICD-10-CM | POA: Diagnosis not present

## 2021-02-10 DIAGNOSIS — Z5321 Procedure and treatment not carried out due to patient leaving prior to being seen by health care provider: Secondary | ICD-10-CM | POA: Insufficient documentation

## 2021-02-10 LAB — URINALYSIS, ROUTINE W REFLEX MICROSCOPIC
Bacteria, UA: NONE SEEN
Bilirubin Urine: NEGATIVE
Glucose, UA: NEGATIVE mg/dL
Ketones, ur: 5 mg/dL — AB
Nitrite: NEGATIVE
Protein, ur: 100 mg/dL — AB
RBC / HPF: >50 RBC/hpf — ABNORMAL HIGH (ref 0–5)
Specific Gravity, Urine: 1.024 (ref 1.005–1.030)
WBC, UA: >50 WBC/hpf — ABNORMAL HIGH (ref 0–5)
pH: 5 (ref 5.0–8.0)

## 2021-02-10 LAB — COMPREHENSIVE METABOLIC PANEL
ALT: 20 U/L (ref 0–44)
AST: 23 U/L (ref 15–41)
Albumin: 4 g/dL (ref 3.5–5.0)
Alkaline Phosphatase: 69 U/L (ref 38–126)
Anion gap: 8 (ref 5–15)
BUN: 6 mg/dL (ref 6–20)
CO2: 25 mmol/L (ref 22–32)
Calcium: 8.9 mg/dL (ref 8.9–10.3)
Chloride: 103 mmol/L (ref 98–111)
Creatinine, Ser: 0.96 mg/dL (ref 0.44–1.00)
GFR, Estimated: >60 mL/min (ref >60)
Glucose, Bld: 98 mg/dL (ref 70–99)
Potassium: 3.4 mmol/L — ABNORMAL LOW (ref 3.5–5.1)
Sodium: 136 mmol/L (ref 135–145)
Total Bilirubin: 0.3 mg/dL (ref 0.3–1.2)
Total Protein: 7.1 g/dL (ref 6.5–8.1)

## 2021-02-10 LAB — I-STAT BETA HCG BLOOD, ED (MC, WL, AP ONLY): I-stat hCG, quantitative: <5 m[IU]/mL (ref <5)

## 2021-02-10 MED ORDER — ACETAMINOPHEN 500 MG PO TABS
1000.0000 mg | ORAL_TABLET | Freq: Once | ORAL | Status: AC
  Administered 2021-02-10: 1000 mg via ORAL
  Filled 2021-02-10: qty 2

## 2021-02-10 NOTE — ED Provider Notes (Signed)
Emergency Medicine Provider Triage Evaluation Note  Heidi Martin , a 20 y.o. female  was evaluated in triage.  Pt complains of fevers, chills, generalized body aches.  Onset of symptoms was today.  She complains of right-sided flank and low back pain.  Also complains of mild dysuria.  She states that she has vaginal bleeding, but denies any new discharge.  She reports that she has been bleeding since she delivered her baby back at the end of May.  She did have an epidural at that time.  Onset of symptoms was earlier today.  Review of Systems  Positive: Back pain, dysuria, fever, vaginal bleeding Negative: Nausea, vomiting, diarrhea  Physical Exam  BP 115/81 (BP Location: Left Arm)   Pulse (!) 123   Temp 100.2 F (37.9 C) (Oral)   Resp 16   SpO2 99%  Gen:   Awake, no distress   Resp:  Normal effort  MSK:   Moves extremities without difficulty  Other:    Medical Decision Making  Medically screening exam initiated at 10:35 PM.  Appropriate orders placed.  JASMAINE ROCHEL was informed that the remainder of the evaluation will be completed by another provider, this initial triage assessment does not replace that evaluation, and the importance of remaining in the ED until their evaluation is complete.  Fever, back pain   Felipa Furnace 02/10/21 2236    Eber Hong, MD 02/11/21 [phone]

## 2021-02-10 NOTE — ED Triage Notes (Signed)
Pt c/o generalized body aches, and chills that started today. Reports low back pain, but states she had an epidural x 2 mos ago. C/o mild "sting" when urinating.

## 2021-02-11 LAB — CBC WITH DIFFERENTIAL/PLATELET
Abs Immature Granulocytes: 0.02 10*3/uL (ref 0.00–0.07)
Basophils Absolute: 0 10*3/uL (ref 0.0–0.1)
Basophils Relative: 1 %
Eosinophils Absolute: 0 10*3/uL (ref 0.0–0.5)
Eosinophils Relative: 0 %
HCT: 38 % (ref 36.0–46.0)
Hemoglobin: 12.6 g/dL (ref 12.0–15.0)
Immature Granulocytes: 0 %
Lymphocytes Relative: 19 %
Lymphs Abs: 1.3 10*3/uL (ref 0.7–4.0)
MCH: 26 pg (ref 26.0–34.0)
MCHC: 33.2 g/dL (ref 30.0–36.0)
MCV: 78.4 fL — ABNORMAL LOW (ref 80.0–100.0)
Monocytes Absolute: 0.8 10*3/uL (ref 0.1–1.0)
Monocytes Relative: 13 %
Neutro Abs: 4.4 10*3/uL (ref 1.7–7.7)
Neutrophils Relative %: 67 %
Platelets: 222 10*3/uL (ref 150–400)
RBC: 4.85 MIL/uL (ref 3.87–5.11)
RDW: 15.2 % (ref 11.5–15.5)
WBC: 6.6 10*3/uL (ref 4.0–10.5)
nRBC: 0 % (ref 0.0–0.2)

## 2021-02-11 LAB — RESP PANEL BY RT-PCR (FLU A&B, COVID) ARPGX2
Influenza A by PCR: NEGATIVE
Influenza B by PCR: NEGATIVE
SARS Coronavirus 2 by RT PCR: NEGATIVE

## 2021-02-11 IMAGING — US US MFM OB TRANSVAGINAL
1 series · 15 of 28 positions shown · non-contrast
Comparison: none

[Series 1: us mfm ob transvaginal · 28 acquisitions, 15 frames shown]
[im 1/28]
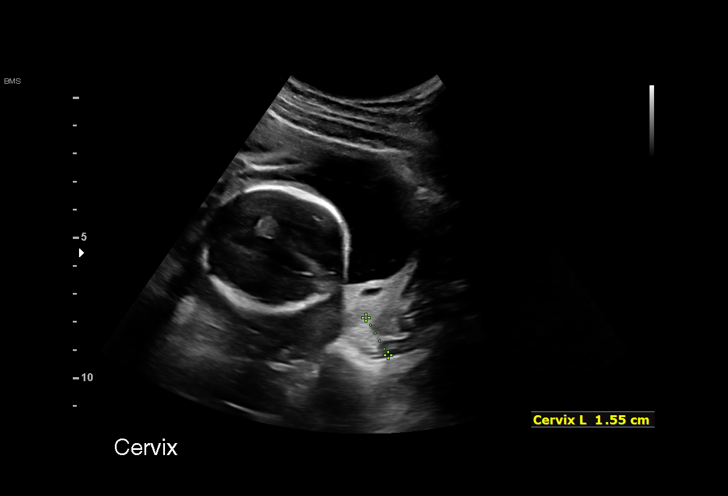
[im 3/28]
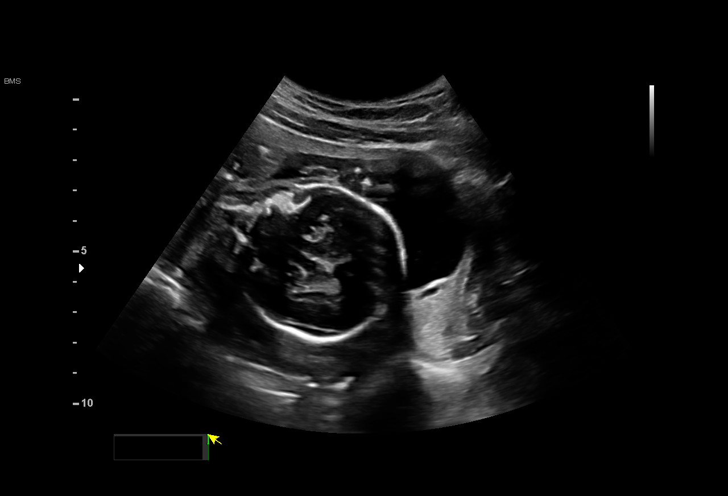
[im 5/28]
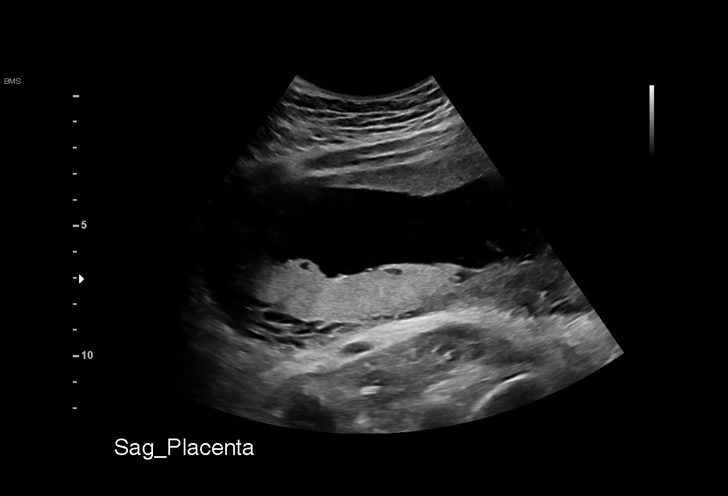
[im 7/28]
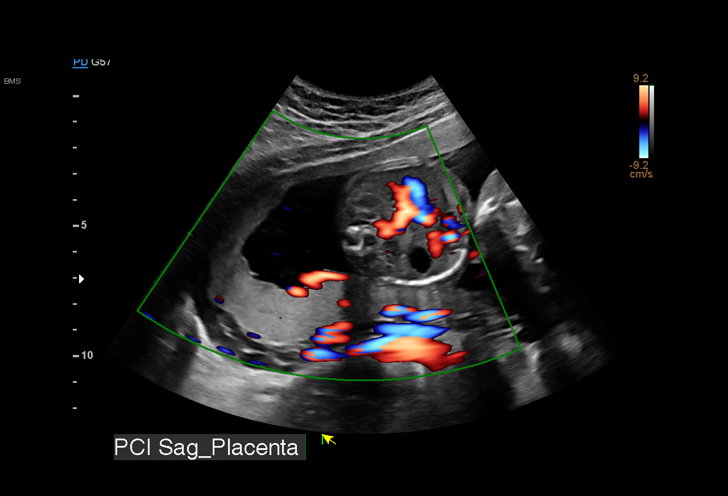
[im 9/28]
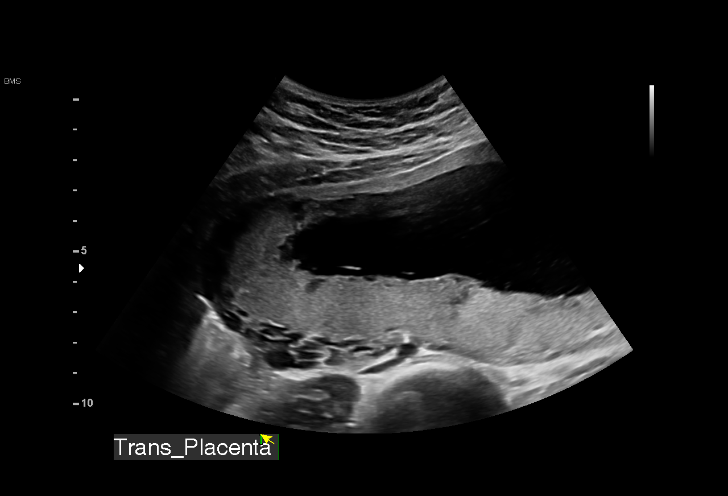
[im 11/28]
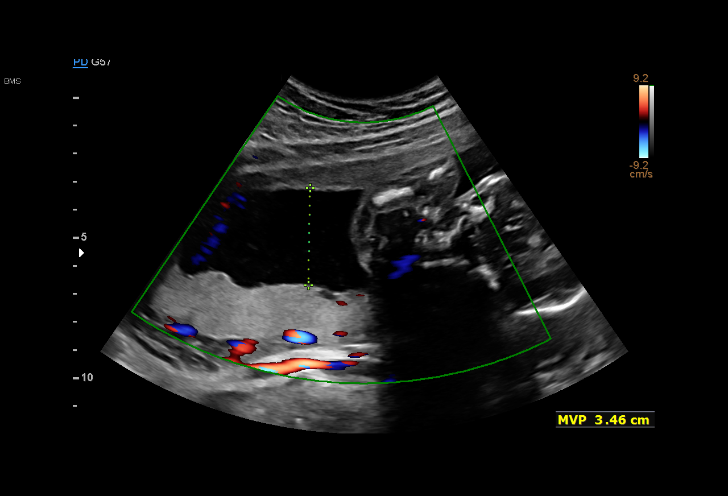
[im 13/28]
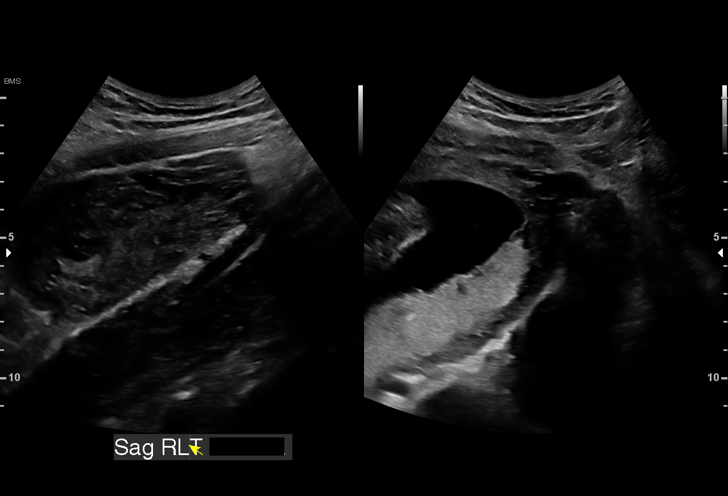
[im 15/28]
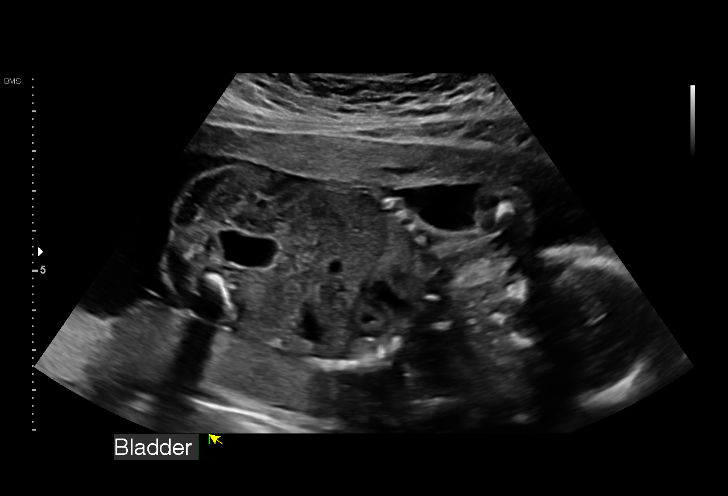
[im 16/28]
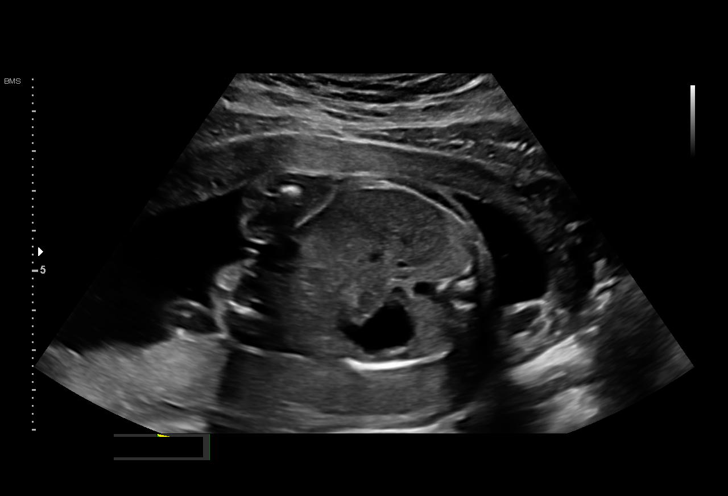
[im 18/28]
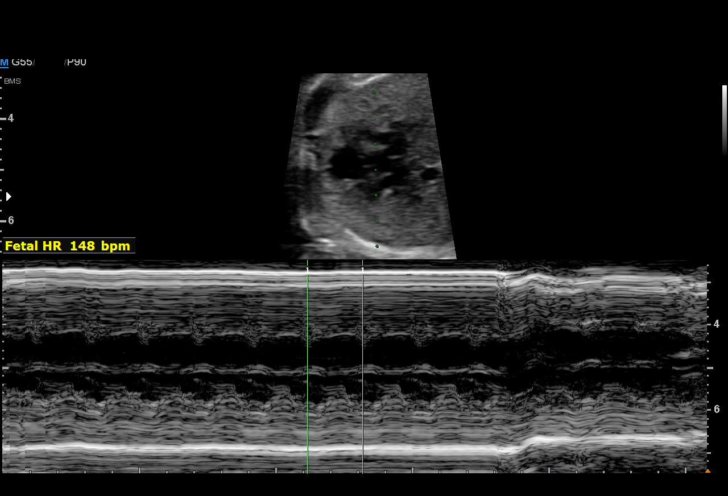
[im 20/28]
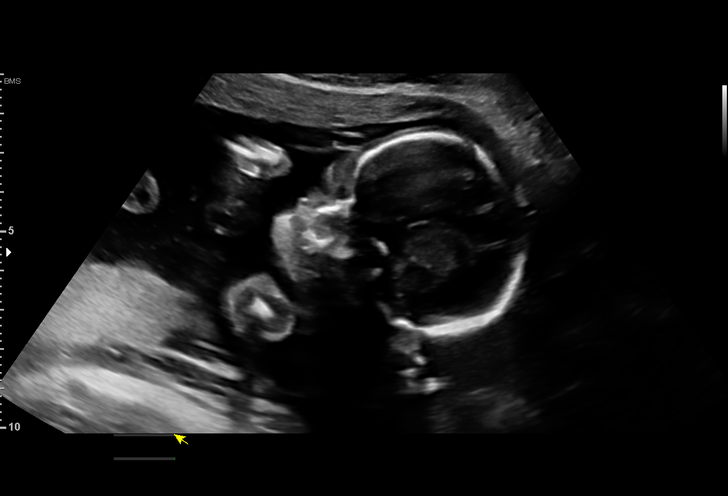
[im 22/28]
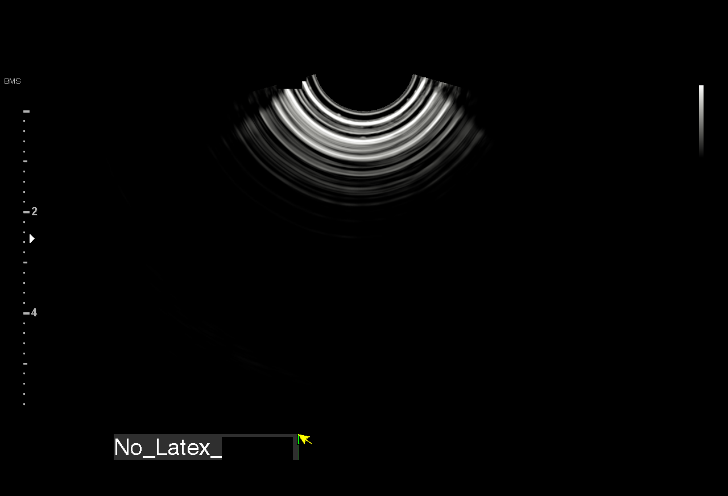
[im 24/28]
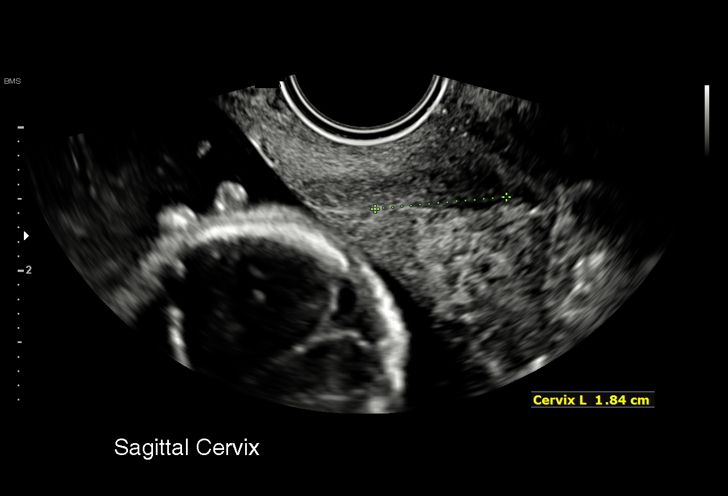
[im 26/28]
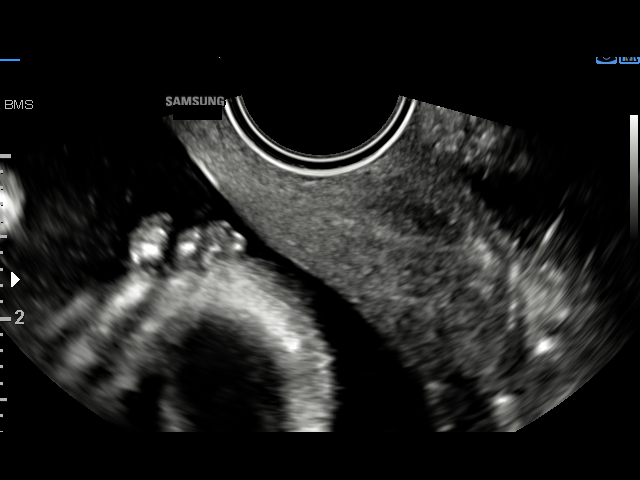
[im 28/28]
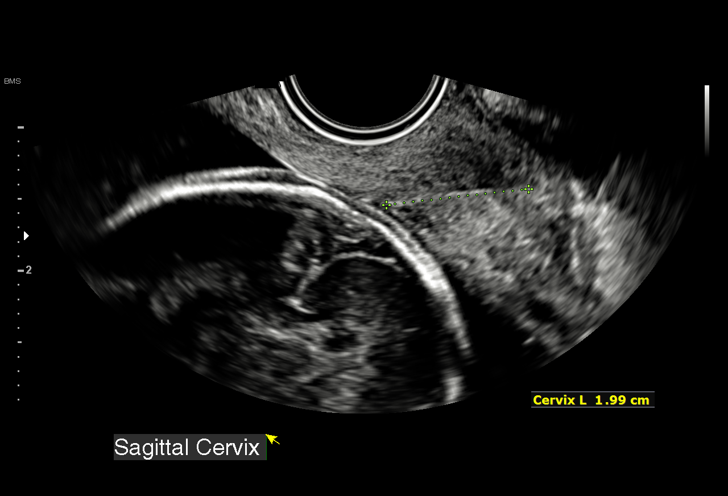

[15 of 28 positions shown; findings below may reference images not displayed]

[facility]                                [HOSPITAL] at

Indications

 21 weeks gestation of pregnancy
 Cervical shortening, second trimester
 History of sickle cell trait
 Encounter for antenatal screening for
 malformations
 Negative AFP
Fetal Evaluation

 Num Of Fetuses:         1
 Fetal Heart Rate(bpm):  148
 Cardiac Activity:       Observed
 Presentation:           Cephalic
 Placenta:               Posterior
 P. Cord Insertion:      Visualized

 Amniotic Fluid
 AFI FV:      Within normal limits

                             Largest Pocket(cm)

OB History

 Gravidity:    2         Term:   0        Prem:   0        SAB:   0
 TOP:          1       Ectopic:  0        Living: 0
Gestational Age
 LMP:           21w 2d        Date:  03/09/20                 EDD:   12/14/20
 Best:          21w 2d     Det. By:  LMP  (03/09/20)          EDD:   12/14/20
Anatomy

 Ventricles:            Appears normal         Stomach:                Appears normal, left
                                                                       sided
 Diaphragm:             Appears normal         Bladder:                Appears normal
Cervix Uterus Adnexa

 Cervix
 Length:              2  cm.
 Measured transvaginally.

 Uterus
 No abnormality visualized.

 Right Ovary
 Not visualized.

 Left Ovary
 Not visualized.

 Cul De Sac
 No free fluid seen.

 Adnexa
 No adnexal mass visualized.
Impression

 Follow up CL for shortened cervix on previous examination.
 Today a transvaginal ultrasound was performed to assess the
 cervical length
 The CL was measured at 2 cm today and there was no
 dynamic elements to the cervix.

 I reviewed today's examination with Ms. Giorgi she will follow
 up in 2 weeks for a repeat CL given the stable nature of her
 cervix.
Recommendations

 Cervical length scheduled in 2 weeks.

## 2021-02-11 NOTE — ED Notes (Signed)
Pt left AMA °

## 2021-02-12 ENCOUNTER — Ambulatory Visit (HOSPITAL_COMMUNITY)
Admission: EM | Admit: 2021-02-12 | Discharge: 2021-02-12 | Disposition: A | Discharge status: Home / Self Care | Payer: Medicaid Other | Attending: Family Medicine | Admitting: Family Medicine

## 2021-02-12 ENCOUNTER — Encounter (HOSPITAL_COMMUNITY): Payer: Self-pay

## 2021-02-12 ENCOUNTER — Other Ambulatory Visit: Payer: Self-pay

## 2021-02-12 DIAGNOSIS — R300 Dysuria: Secondary | ICD-10-CM | POA: Diagnosis not present

## 2021-02-12 DIAGNOSIS — N898 Other specified noninflammatory disorders of vagina: Secondary | ICD-10-CM | POA: Diagnosis present

## 2021-02-12 DIAGNOSIS — N938 Other specified abnormal uterine and vaginal bleeding: Secondary | ICD-10-CM

## 2021-02-12 DIAGNOSIS — R6883 Chills (without fever): Secondary | ICD-10-CM

## 2021-02-12 LAB — POCT URINALYSIS DIPSTICK, ED / UC
Bilirubin Urine: NEGATIVE
Glucose, UA: NEGATIVE mg/dL
Ketones, ur: NEGATIVE mg/dL
Nitrite: NEGATIVE
Protein, ur: NEGATIVE mg/dL
Specific Gravity, Urine: 1.025 (ref 1.005–1.030)
Urobilinogen, UA: 0.2 mg/dL (ref 0.0–1.0)
pH: 5 (ref 5.0–8.0)

## 2021-02-12 MED ORDER — NITROFURANTOIN MONOHYD MACRO 100 MG PO CAPS: 100.0000 mg | ORAL_CAPSULE | Freq: Two times a day (BID) | ORAL | 0 refills | Status: DC

## 2021-02-12 NOTE — ED Provider Notes (Addendum)
[facility]    CSN: [id number] Arrival date & time: 02/12/21  1615      History   Chief Complaint Chief Complaint  Patient presents with   Generalized Body Aches    HPI Heidi Martin is a 20 y.o. female.   Presenting today with about a week of chills, aches, and back aching and now the past 2 days dysuria, vaginal discharge. States she has been bleeding and spotting for about 3 weeks now, recently got on depo injections post partum and has had irregular bleeding since. Went to ED 2 days ago, did not stay past triage due to wait times. Per chart review neg preg blood test, fairly benign labs, neg respiratory panel. Not taking anything OTC for sxs. Tolerating PO well.    Past Medical History:  Diagnosis Date   COVID-19    Irritable bowel syndrome (IBS)    Sickle cell trait [facility])     Patient Active Problem List   Diagnosis Date Noted   History of therapeutic abortion 06/23/2020   Sickle cell trait (HCC) 05/31/2015    Past Surgical History:  Procedure Laterality Date   INDUCED ABORTION     NO PAST SURGERIES      OB History     Gravida  2   Para  1   Term  1   Preterm      AB  1   Living  1      SAB  0   IAB  1   Ectopic      Multiple  0   Live Births  1            Home Medications    Prior to Admission medications   Medication Sig Start Date End Date Taking? Authorizing Provider  nitrofurantoin, macrocrystal-monohydrate, (MACROBID) 100 MG capsule Take 1 capsule (100 mg total) by mouth 2 (two) times daily. 02/12/21  Yes Particia Nearing, PA-C  ibuprofen (ADVIL) 600 MG tablet Take 1 tablet (600 mg total) by mouth every 6 (six) hours. Patient not taking: Reported on 01/05/2021 12/01/20   Littie Deeds, MD  medroxyPROGESTERone (DEPO-PROVERA) 150 MG/ML injection Inject 1 mL (150 mg total) into the muscle every 3 (three) months. 01/05/21   Adam Phenix, MD  Prenatal Vit-Fe Fumarate-FA (PRENATAL MULTIVITAMIN) TABS tablet Take 1 tablet  by mouth daily at 12 noon. Patient not taking: Reported on 01/05/2021    [provider]  dicyclomine (BENTYL) 20 MG tablet Take 1 tablet (20 mg total) by mouth 2 (two) times daily. Patient not taking: No sig reported 11/14/19 02/14/20  Renne Crigler, PA-C  metoCLOPramide (REGLAN) 10 MG tablet Take 1 tablet (10 mg total) by mouth 3 (three) times daily between meals as needed for nausea. Patient not taking: Reported on 02/14/2020 10/04/19 02/14/20  Leftwich-Kirby, Wilmer Floor, CNM  promethazine (PHENERGAN) 12.5 MG tablet Take 1-2 tablets (12.5-25 mg total) by mouth at bedtime. Patient not taking: Reported on 02/14/2020 10/04/19 02/14/20  Leftwich-Kirby, Wilmer Floor, CNM    Family History Family History  Problem Relation Age of Onset   Cancer Mother    Depression Mother    Miscarriages / [place] Mother    Hypertension Mother    Hypertension Father    Cancer Maternal Grandmother    Diabetes Maternal Grandmother    Miscarriages / Stillbirths Maternal Grandmother     Social History Social History   Tobacco Use   Smoking status: Never   Smokeless tobacco: Never  Vaping Use  Vaping Use: Never used  Substance Use Topics   Alcohol use: No   Drug use: No     Allergies   Milk [lac bovis]   Review of Systems Review of Systems PER HPI    Physical Exam Triage Vital Signs ED Triage Vitals  Enc Vitals Group     BP 02/12/21 1630 114/70     Pulse Rate 02/12/21 1630 97     Resp 02/12/21 1630 18     Temp 02/12/21 1630 99.2 F (37.3 C)     Temp Source 02/12/21 1630 Oral     SpO2 02/12/21 1630 100 %     Weight --      Height --      Head Circumference --      Peak Flow --      Pain Score 02/12/21 1629 0     Pain Loc --      Pain Edu? --      Excl. in GC? --    No data found.  Updated Vital Signs BP 114/70 (BP Location: Right Arm)   Pulse 97   Temp 99.2 F (37.3 C) (Oral)   Resp 18   SpO2 100%   Visual Acuity Right Eye Distance:   Left Eye Distance:   Bilateral  Distance:    Right Eye Near:   Left Eye Near:    Bilateral Near:     Physical Exam Vitals and nursing note reviewed.  Constitutional:      Appearance: Normal appearance. She is not ill-appearing.  HENT:     Head: Atraumatic.  Eyes:     Extraocular Movements: Extraocular movements intact.     Conjunctiva/sclera: Conjunctivae normal.  Cardiovascular:     Rate and Rhythm: Normal rate and regular rhythm.     Heart sounds: Normal heart sounds.  Pulmonary:     Effort: Pulmonary effort is normal.     Breath sounds: Normal breath sounds.  Abdominal:     General: Bowel sounds are normal. There is no distension.     Palpations: Abdomen is soft.     Tenderness: There is no abdominal tenderness. There is no right CVA tenderness, left CVA tenderness or guarding.  Genitourinary:    Comments: GU exam deferred, self swab performed Musculoskeletal:        General: Normal range of motion.     Cervical back: Normal range of motion and neck supple.  Skin:    General: Skin is warm and dry.  Neurological:     Mental Status: She is alert and oriented to person, place, and time.  Psychiatric:        Mood and Affect: Mood normal.        Thought Content: Thought content normal.        Judgment: Judgment normal.   UC Treatments / Results  Labs (all labs ordered are listed, but only abnormal results are displayed) Labs Reviewed  POCT URINALYSIS DIPSTICK, ED / UC - Abnormal; Notable for the following components:      Result Value   Hgb urine dipstick MODERATE (*)    Leukocytes,Ua SMALL (*)    All other components within normal limits  URINE CULTURE  CERVICOVAGINAL ANCILLARY ONLY    EKG   Radiology No results found.  Procedures Procedures (including critical care time)  Medications Ordered in UC Medications - No data to display  Initial Impression / Assessment and Plan / UC Course  I have reviewed the triage vital signs and the nursing  notes.  Pertinent labs & imaging results  that were available during my care of the patient were reviewed by me and considered in my medical decision making (see chart for details).     Given sxs and leuks on U/A both here and in ED, will cover for UTI with macrobid while awaiting urine culture, vaginal swab results. Discussed OTC supportive medications and home care, return precautions, f/u with OBGYN regarding irregular bleeding on depo if not resolving.   Final Clinical Impressions(s) / UC Diagnoses   Final diagnoses:  Dysuria  Chills  Uterine bleeding, dysfunctional  Vaginal discharge   Discharge Instructions   None    ED Prescriptions     Medication Sig Dispense Auth. Provider   nitrofurantoin, macrocrystal-monohydrate, (MACROBID) 100 MG capsule Take 1 capsule (100 mg total) by mouth 2 (two) times daily. 10 capsule Particia Nearing, [place]      PDMP not reviewed this encounter.   Particia Nearing, PA-C 02/12/21 1712    Particia Nearing, [place] 02/12/21 1713

## 2021-02-12 NOTE — ED Triage Notes (Signed)
Pt reports by aches x 1 week.   Reports she had a negative COVID test 2 days ago ata the ED.   Pt reports she was recently pregnant (2 months ago) and his menstrual period still going on x 3 week.

## 2021-02-14 LAB — CERVICOVAGINAL ANCILLARY ONLY
Bacterial Vaginitis (gardnerella): POSITIVE — AB
Candida Glabrata: NEGATIVE
Candida Vaginitis: NEGATIVE
Chlamydia: POSITIVE — AB
Comment: NEGATIVE
Comment: NEGATIVE
Comment: NEGATIVE
Comment: NEGATIVE
Comment: NEGATIVE
Comment: NORMAL
Neisseria Gonorrhea: POSITIVE — AB
Trichomonas: NEGATIVE

## 2021-02-14 LAB — URINE CULTURE

## 2021-02-17 ENCOUNTER — Telehealth (HOSPITAL_COMMUNITY): Payer: Self-pay | Admitting: Emergency Medicine

## 2021-02-18 MED ORDER — METRONIDAZOLE 0.75 % VA GEL: 1.0000 | Freq: Every day | VAGINAL | 0 refills | Status: AC

## 2021-02-18 MED ORDER — DOXYCYCLINE HYCLATE 100 MG PO CAPS: 100.0000 mg | ORAL_CAPSULE | Freq: Two times a day (BID) | ORAL | 0 refills | Status: AC

## 2021-03-18 ENCOUNTER — Emergency Department (HOSPITAL_COMMUNITY)
Admission: EM | Admit: 2021-03-18 | Discharge: 2021-03-19 | Disposition: A | Discharge status: Home / Self Care | Payer: Medicaid Other | Attending: Emergency Medicine | Admitting: Emergency Medicine

## 2021-03-18 ENCOUNTER — Ambulatory Visit: Payer: Self-pay

## 2021-03-18 ENCOUNTER — Other Ambulatory Visit: Payer: Self-pay

## 2021-03-18 ENCOUNTER — Encounter (HOSPITAL_COMMUNITY): Payer: Self-pay | Admitting: Emergency Medicine

## 2021-03-18 DIAGNOSIS — R109 Unspecified abdominal pain: Secondary | ICD-10-CM | POA: Insufficient documentation

## 2021-03-18 DIAGNOSIS — R309 Painful micturition, unspecified: Secondary | ICD-10-CM | POA: Insufficient documentation

## 2021-03-18 DIAGNOSIS — Z5321 Procedure and treatment not carried out due to patient leaving prior to being seen by health care provider: Secondary | ICD-10-CM | POA: Diagnosis not present

## 2021-03-18 DIAGNOSIS — R198 Other specified symptoms and signs involving the digestive system and abdomen: Secondary | ICD-10-CM | POA: Insufficient documentation

## 2021-03-18 LAB — CBC WITH DIFFERENTIAL/PLATELET
Abs Immature Granulocytes: 0.02 10*3/uL (ref 0.00–0.07)
Basophils Absolute: 0 10*3/uL (ref 0.0–0.1)
Basophils Relative: 1 %
Eosinophils Absolute: 0.1 10*3/uL (ref 0.0–0.5)
Eosinophils Relative: 2 %
HCT: 35.7 % — ABNORMAL LOW (ref 36.0–46.0)
Hemoglobin: 11.7 g/dL — ABNORMAL LOW (ref 12.0–15.0)
Immature Granulocytes: 0 %
Lymphocytes Relative: 28 %
Lymphs Abs: 2.3 10*3/uL (ref 0.7–4.0)
MCH: 25.7 pg — ABNORMAL LOW (ref 26.0–34.0)
MCHC: 32.8 g/dL (ref 30.0–36.0)
MCV: 78.3 fL — ABNORMAL LOW (ref 80.0–100.0)
Monocytes Absolute: 0.6 10*3/uL (ref 0.1–1.0)
Monocytes Relative: 7 %
Neutro Abs: 5.1 10*3/uL (ref 1.7–7.7)
Neutrophils Relative %: 62 %
Platelets: 307 10*3/uL (ref 150–400)
RBC: 4.56 MIL/uL (ref 3.87–5.11)
RDW: 15.8 % — ABNORMAL HIGH (ref 11.5–15.5)
WBC: 8.1 10*3/uL (ref 4.0–10.5)
nRBC: 0 % (ref 0.0–0.2)

## 2021-03-18 LAB — COMPREHENSIVE METABOLIC PANEL
ALT: 16 U/L (ref 0–44)
AST: 21 U/L (ref 15–41)
Albumin: 3.9 g/dL (ref 3.5–5.0)
Alkaline Phosphatase: 67 U/L (ref 38–126)
Anion gap: 7 (ref 5–15)
BUN: 7 mg/dL (ref 6–20)
CO2: 25 mmol/L (ref 22–32)
Calcium: 9.1 mg/dL (ref 8.9–10.3)
Chloride: 107 mmol/L (ref 98–111)
Creatinine, Ser: 0.88 mg/dL (ref 0.44–1.00)
GFR, Estimated: >60 mL/min (ref >60)
Glucose, Bld: 99 mg/dL (ref 70–99)
Potassium: 3.6 mmol/L (ref 3.5–5.1)
Sodium: 139 mmol/L (ref 135–145)
Total Bilirubin: 0.6 mg/dL (ref 0.3–1.2)
Total Protein: 6.8 g/dL (ref 6.5–8.1)

## 2021-03-18 LAB — LIPASE, BLOOD: Lipase: 29 U/L (ref 11–51)

## 2021-03-18 LAB — I-STAT BETA HCG BLOOD, ED (MC, WL, AP ONLY): I-stat hCG, quantitative: <5 m[IU]/mL (ref <5)

## 2021-03-18 NOTE — ED Provider Notes (Signed)
Emergency Medicine Provider Triage Evaluation Note  Heidi Martin , a 20 y.o. female  was evaluated in triage.  Pt complains of abdominal pain. States she started having vaginal bleeding that is fairly dark and light 6 days ago, different from her typical menstrual cycle, subsequently developed crampy/contraction type left sided abdominal pain. Having constipation and some dysuria. Unsure of when her last period was prior to this. Sexually active in a monogamous relationship.   Review of Systems  Positive: Abdominal pain, vaginal bleeding, constipation, dysuria Negative: Fever  Physical Exam  BP 111/71   Pulse 77   Temp 98.1 F (36.7 C) (Oral)   Resp 16   SpO2 100%  Gen:   Awake, no distress   Resp:  Normal effort  MSK:   Moves extremities without difficulty  Other:  Mild left sided abdominal TTP, no peritoneal signs.   Medical Decision Making  Medically screening exam initiated at 10:21 PM.  Appropriate orders placed.  Heidi Martin was informed that the remainder of the evaluation will be completed by another provider, this initial triage assessment does not replace that evaluation, and the importance of remaining in the ED until their evaluation is complete.  Abdominal pain.    Desmond Lope 03/18/21 2231    Cheryll Cockayne, MD 03/19/21 2302

## 2021-03-18 NOTE — ED Triage Notes (Signed)
Pt reports left sided abd pain that started before her cycle however her period started on the 10th which has been abnormal.  Reports pain w/ urination and pain w/ bowel movement (not sure when last one was.)

## 2021-03-18 NOTE — Telephone Encounter (Signed)
Patient called and says she's been having off and on abdominal pain since 03/14/21 and that it feels like menstrual cramps after having a baby. She says the pain is a 10 when she has it, but no pain at present. She says she has burning with urination and her menstrual blood has been brown the whole time since it started on 03/12/21, denies any other symptoms. I advised to go to the UC or ED for the abdominal pain and urination symptoms, care advice given, patient verbalized understanding.   Reason for Disposition  [1] MODERATE pain (e.g., interferes with normal activities) AND [2] pain comes and goes (cramps) AND [3] present > 24 hours  (Exception: pain with Vomiting or Diarrhea - see that Guideline)  Answer Assessment - Initial Assessment Questions 1. LOCATION: "Where does it hurt?"      Lower left side 2. RADIATION: "Does the pain shoot anywhere else?" (e.g., chest, back)     No 3. ONSET: "When did the pain begin?" (e.g., minutes, hours or days ago)      03/14/21 2 days after menstrual started 4. SUDDEN: "Gradual or sudden onset?"     Sudden 5. PATTERN "Does the pain come and go, or is it constant?"    - If constant: "Is it getting better, staying the same, or worsening?"      (Note: Constant means the pain never goes away completely; most serious pain is constant and it progresses)     - If intermittent: "How long does it last?" "Do you have pain now?"     (Note: Intermittent means the pain goes away completely between bouts)     Comes and goes 6. SEVERITY: "How bad is the pain?"  (e.g., Scale 1-10; mild, moderate, or severe)   - MILD (1-3): doesn't interfere with normal activities, abdomen soft and not tender to touch    - MODERATE (4-7): interferes with normal activities or awakens from sleep, abdomen tender to touch    - SEVERE (8-10): excruciating pain, doubled over, unable to do any normal activities      10 when it happens; no pain at present 7. RECURRENT SYMPTOM: "Have you ever had  this type of stomach pain before?" If Yes, ask: "When was the last time?" and "What happened that time?"      Not since being pregnant 8. CAUSE: "What do you think is causing the stomach pain?"     I don't know 9. RELIEVING/AGGRAVATING FACTORS: "What makes it better or worse?" (e.g., movement, antacids, bowel movement)     Nothing really 10. OTHER SYMPTOMS: "Do you have any other symptoms?" (e.g., back pain, diarrhea, fever, urination pain, vomiting)       Urination pain, brown blood with menstrual period 11. PREGNANCY: "Is there any chance you are pregnant?" "When was your last menstrual period?"       I don't know, on menstrual period now  Protocols used: Abdominal Pain - [facility]

## 2021-03-19 NOTE — ED Notes (Signed)
Called pt for vitals, no response x1 

## 2021-03-24 ENCOUNTER — Ambulatory Visit: Payer: Self-pay | Admitting: *Deleted

## 2021-03-24 NOTE — Telephone Encounter (Signed)
Pt reports vaginal bleeding onset one hour ago. States "Just a little, you can tell" Has not had to change any pads in that hour. Reports abdominal cramping 8/10. States last period 4 days ago "Dark red blood." Went to ED 03/18/21 and left after waiting. States cramping went away, returns now last hour. Denies fever, no dizziness or weakness. States possibility she could b epreganct. Has 96 month old. States she is seen at [facility]. Advised to reach out to them, walk in clinic. Advised ED if symptoms worsen. Pt verbalizes understanding.  No PCP.     Reason for Disposition  [1] MILD bleeding or SPOTTING AND [2] could be pregnant (e.g., missed last period)  Answer Assessment - Initial Assessment Questions 1. AMOUNT: "Describe the bleeding that you are having."    - SPOTTING: spotting, or pinkish / brownish mucous discharge; does not fill panty liner or pad    - MILD:  less than 1 pad / hour; less than patient's usual menstrual bleeding   - MODERATE: 1-2 pads / hour; 1 menstrual cup every 6 hours; small-medium blood clots (e.g., pea, grape, small coin)   - SEVERE: soaking 2 or more pads/hour for 2 or more hours; 1 menstrual cup every 2 hours; bleeding not contained by pads or continuous red blood from vagina; large blood clots (e.g., golf ball, large coin)      Not at all 2. ONSET: "When did the bleeding begin?" "Is it continuing now?"     One hour 3. MENSTRUAL PERIOD: "When was the last normal menstrual period?" "How is this different than your period?"     4 days ago 4. REGULARITY: "How regular are your periods?"     Yes 5. ABDOMINAL PAIN: "Do you have any pain?" "How bad is the pain?"  (e.g., Scale 1-10; mild, moderate, or severe)   - MILD (1-3): doesn't interfere with normal activities, abdomen soft and not tender to touch    - MODERATE (4-7): interferes with normal activities or awakens from sleep, abdomen tender to touch    - SEVERE (8-10): excruciating pain, doubled over, unable to  do any normal activities      Cramping, 8/10 6. PREGNANCY: "Could you be pregnant?" "Are you sexually active?" "Did you recently give birth?"     4 months ago. Possible chance pregnant again 7. BREASTFEEDING: "Are you breastfeeding?"     no 8. HORMONES: "Are you taking any hormone medications, prescription or OTC?" (e.g., birth control pills, estrogen)     Depo shots 9. BLOOD THINNERS: "Do you take any blood thinners?" (e.g., Coumadin/warfarin, Pradaxa/dabigatran, aspirin)     no 10. CAUSE: "What do you think is causing the bleeding?" (e.g., recent gyn surgery, recent gyn procedure; known bleeding disorder, cervical cancer, polycystic ovarian disease, fibroids)         unsure 11. HEMODYNAMIC STATUS: "Are you weak or feeling lightheaded?" If Yes, ask: "Can you stand and walk normally?"        no 12. OTHER SYMPTOMS: "What other symptoms are you having with the bleeding?" (e.g., passed tissue, vaginal discharge, fever, menstrual-type cramps)       Last period dark  Protocols used: Vaginal Bleeding - Abnormal-A-AH

## 2021-04-07 ENCOUNTER — Ambulatory Visit: Payer: Medicaid Other

## 2021-05-11 ENCOUNTER — Emergency Department (HOSPITAL_COMMUNITY)
Admission: EM | Admit: 2021-05-11 | Discharge: 2021-05-11 | Disposition: A | Discharge status: Home / Self Care | Payer: Medicaid Other | Attending: Emergency Medicine | Admitting: Emergency Medicine

## 2021-05-11 ENCOUNTER — Encounter (HOSPITAL_COMMUNITY): Payer: Self-pay | Admitting: Emergency Medicine

## 2021-05-11 DIAGNOSIS — U071 COVID-19: Secondary | ICD-10-CM | POA: Insufficient documentation

## 2021-05-11 DIAGNOSIS — M791 Myalgia, unspecified site: Secondary | ICD-10-CM | POA: Diagnosis present

## 2021-05-11 DIAGNOSIS — J069 Acute upper respiratory infection, unspecified: Secondary | ICD-10-CM

## 2021-05-11 LAB — RESP PANEL BY RT-PCR (FLU A&B, COVID) ARPGX2
Influenza A by PCR: POSITIVE — AB
Influenza B by PCR: NEGATIVE
SARS Coronavirus 2 by RT PCR: NEGATIVE

## 2021-05-11 MED ORDER — ONDANSETRON 4 MG PO TBDP: 4.0000 mg | ORAL_TABLET | Freq: Three times a day (TID) | ORAL | 0 refills | Status: DC | PRN

## 2021-05-11 MED ORDER — ONDANSETRON 4 MG PO TBDP
4.0000 mg | ORAL_TABLET | Freq: Once | ORAL | Status: AC
  Administered 2021-05-11: 4 mg via ORAL
  Filled 2021-05-11: qty 1

## 2021-05-11 NOTE — ED Provider Notes (Signed)
[facility] EMERGENCY DEPARTMENT Provider Note   CSN: [id number] Arrival date & time: 05/11/21  1041     History Chief Complaint  Patient presents with   Generalized Body Aches    Heidi Martin is a 20 y.o. female.  20 year old female presents with complaint of cough, body aches, vomiting and feeling unwell x3 days.  Patient was exposed to her daughter who has tested positive for the flu.  Patient is taking Motrin and Tylenol at home.  No other complaints or concerns today.      Past Medical History:  Diagnosis Date   COVID-19    Irritable bowel syndrome (IBS)    Sickle cell trait [facility])     Patient Active Problem List   Diagnosis Date Noted   History of therapeutic abortion 06/23/2020   Sickle cell trait (HCC) 05/31/2015    Past Surgical History:  Procedure Laterality Date   INDUCED ABORTION     NO PAST SURGERIES       OB History     Gravida  2   Para  1   Term  1   Preterm      AB  1   Living  1      SAB  0   IAB  1   Ectopic      Multiple  0   Live Births  1           Family History  Problem Relation Age of Onset   Cancer Mother    Depression Mother    Miscarriages / Stillbirths Mother    Hypertension Mother    Hypertension Father    Cancer Maternal Grandmother    Diabetes Maternal Grandmother    Miscarriages / Stillbirths Maternal Grandmother     Social History   Tobacco Use   Smoking status: Never   Smokeless tobacco: Never  Vaping Use   Vaping Use: Never used  Substance Use Topics   Alcohol use: No   Drug use: No    Home Medications Prior to Admission medications   Medication Sig Start Date End Date Taking? Authorizing Provider  ondansetron (ZOFRAN ODT) 4 MG disintegrating tablet Take 1 tablet (4 mg total) by mouth every 8 (eight) hours as needed for nausea or vomiting. 05/11/21  Yes Jeannie Fend, PA-C  ibuprofen (ADVIL) 600 MG tablet Take 1 tablet (600 mg total) by mouth every 6 (six)  hours. Patient not taking: Reported on 01/05/2021 12/01/20   Littie Deeds, MD  medroxyPROGESTERone (DEPO-PROVERA) 150 MG/ML injection Inject 1 mL (150 mg total) into the muscle every 3 (three) months. 01/05/21   Adam Phenix, MD  nitrofurantoin, macrocrystal-monohydrate, (MACROBID) 100 MG capsule Take 1 capsule (100 mg total) by mouth 2 (two) times daily. 02/12/21   Particia Nearing, PA-C  Prenatal Vit-Fe Fumarate-FA (PRENATAL MULTIVITAMIN) TABS tablet Take 1 tablet by mouth daily at 12 noon. Patient not taking: Reported on 01/05/2021    [provider]  dicyclomine (BENTYL) 20 MG tablet Take 1 tablet (20 mg total) by mouth 2 (two) times daily. Patient not taking: No sig reported 11/14/19 02/14/20  Renne Crigler, PA-C  metoCLOPramide (REGLAN) 10 MG tablet Take 1 tablet (10 mg total) by mouth 3 (three) times daily between meals as needed for nausea. Patient not taking: Reported on 02/14/2020 10/04/19 02/14/20  Leftwich-Kirby, Wilmer Floor, CNM  promethazine (PHENERGAN) 12.5 MG tablet Take 1-2 tablets (12.5-25 mg total) by mouth at bedtime. Patient not taking: Reported on 02/14/2020  10/04/19 02/14/20  Leftwich-Kirby, Wilmer Floor, CNM    Allergies    Milk [lac bovis]  Review of Systems   Review of Systems  Constitutional:  Positive for chills and fever.  HENT:  Positive for congestion.   Respiratory:  Positive for cough.   Gastrointestinal:  Positive for nausea and vomiting.  Genitourinary:  Negative for dysuria.  Musculoskeletal:  Positive for arthralgias and myalgias.  Skin:  Negative for rash and wound.  Allergic/Immunologic: Negative for immunocompromised state.  Neurological:  Positive for headaches.  Hematological:  Negative for adenopathy.  All other systems reviewed and are negative.  Physical Exam Updated Vital Signs BP 108/80 (BP Location: Right Arm)   Pulse 82   Temp 99.1 F (37.3 C) (Oral)   Resp 18   SpO2 100%   Physical Exam Vitals and nursing note reviewed.  Constitutional:       General: She is not in acute distress.    Appearance: She is well-developed. She is not diaphoretic.  HENT:     Head: Normocephalic and atraumatic.     Right Ear: Tympanic membrane and ear canal normal.     Left Ear: Tympanic membrane and ear canal normal.     Nose: Congestion present.     Mouth/Throat:     Mouth: Mucous membranes are moist.  Eyes:     Conjunctiva/sclera: Conjunctivae normal.  Cardiovascular:     Rate and Rhythm: Normal rate and regular rhythm.     Heart sounds: Normal heart sounds.  Pulmonary:     Effort: Pulmonary effort is normal.     Breath sounds: Normal breath sounds.  Abdominal:     Palpations: Abdomen is soft.     Tenderness: There is no abdominal tenderness.  Musculoskeletal:     Cervical back: Neck supple.  Lymphadenopathy:     Cervical: No cervical adenopathy.  Skin:    General: Skin is warm and dry.     Findings: No erythema or rash.  Neurological:     Mental Status: She is alert and oriented to person, place, and time.  Psychiatric:        Behavior: Behavior normal.    ED Results / Procedures / Treatments   Labs (all labs ordered are listed, but only abnormal results are displayed) Labs Reviewed  RESP PANEL BY RT-PCR (FLU A&B, COVID) [id number]    EKG None  Radiology No results found.  Procedures Procedures   Medications Ordered in ED Medications  ondansetron (ZOFRAN-ODT) disintegrating tablet 4 mg (has no administration in time range)    ED Course  I have reviewed the triage vital signs and the nursing notes.  Pertinent labs & imaging results that were available during my care of the patient were reviewed by me and considered in my medical decision making (see chart for details).  Clinical Course as of 05/11/21 1133  Wed May 11, 2021  [id number] 20 year old female with URI symptoms with vomiting, exposed to her child who has the flu.  Patient is well-appearing, vitals reviewed and reassuring including O2 sat 100% on room air.   Patient will be tested for COVID/flu, advised to follow-up in MyChart for her test results.  Given Zofran for her vomiting with prescription for same.  Recommend recheck with PCP as needed. [LM]    Clinical Course User Index [LM] Alden Hipp   MDM Rules/Calculators/A&P  Final Clinical Impression(s) / ED Diagnoses Final diagnoses:  Viral upper respiratory tract infection    Rx / DC Orders ED Discharge Orders          Ordered    ondansetron (ZOFRAN ODT) 4 MG disintegrating tablet  Every 8 hours PRN        05/11/21 1127             Jeannie Fend, PA-C 05/11/21 1133    Benjiman Core, MD 05/11/21 1926

## 2021-05-11 NOTE — ED Notes (Signed)
RN reviewed discharge instructions w/ pt. Follow up and prescriptions reviewed, pt had no further questions °

## 2021-05-11 NOTE — ED Triage Notes (Signed)
Patient complains of generalized body aches, headaches, and emesis that started last night. Patient states she has tried tylenol and motrin without relief. Patient alert, oriented, and in no apparent distress at this time.

## 2021-05-11 NOTE — Discharge Instructions (Addendum)
Home to rest.  Follow-up in your MyChart account for your flu test results which will be available later this evening. Take Zofran as needed as prescribed for nausea and vomiting.  Continue with Motrin Tylenol as needed as directed for fevers and body aches.  Recheck with your doctor as needed.

## 2021-06-02 IMAGING — US US MFM FETAL BPP W/O NON-STRESS
1 series · 14 of 14 positions shown · non-contrast
Comparison: none

[Series 1: us mfm fetal bpp w/o non-stress · 14 acquisitions, 14 frames shown]
[im 1/14]
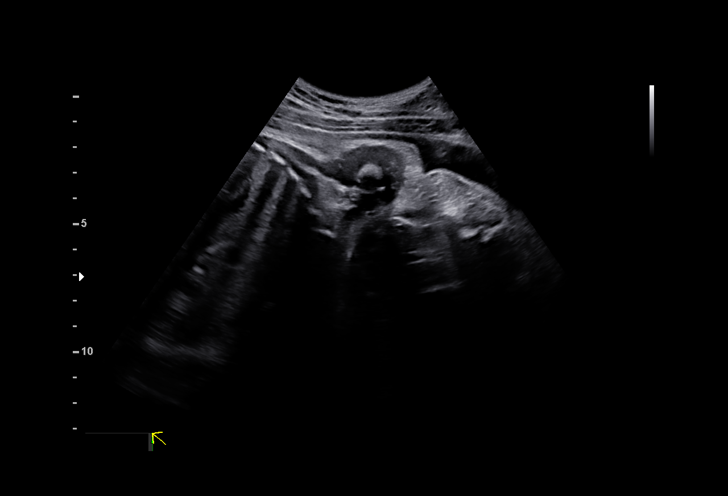
[im 2/14]
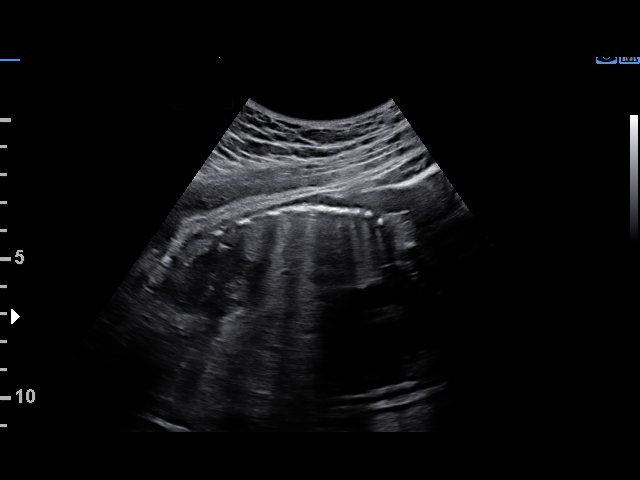
[im 3/14]
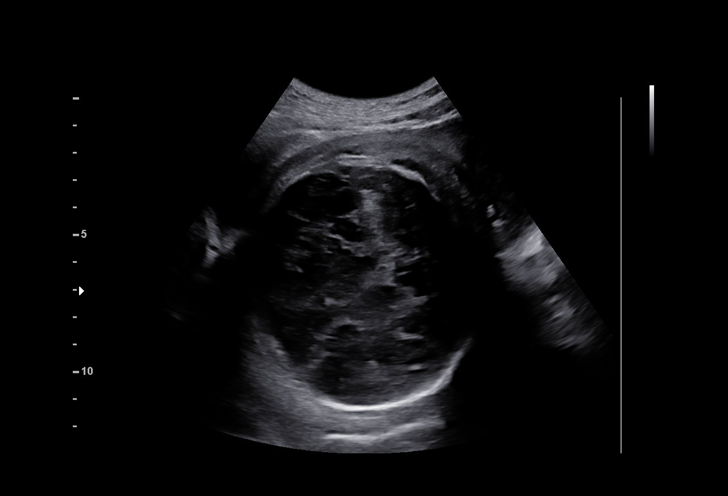
[im 4/14]
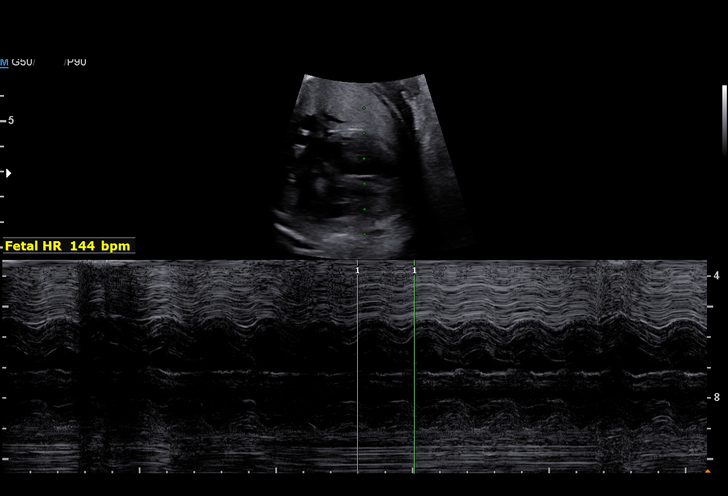
[im 5/14]
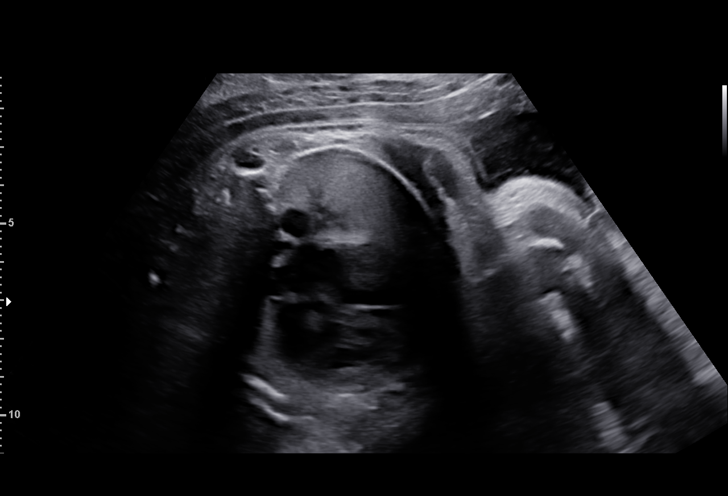
[im 6/14]
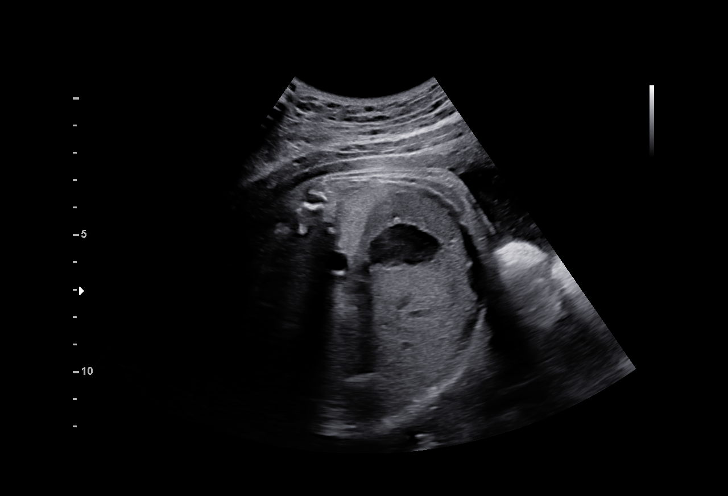
[im 7/14]
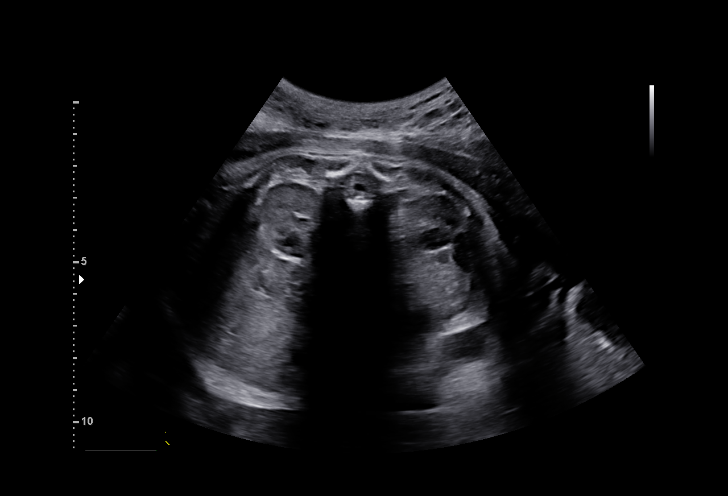
[im 8/14]
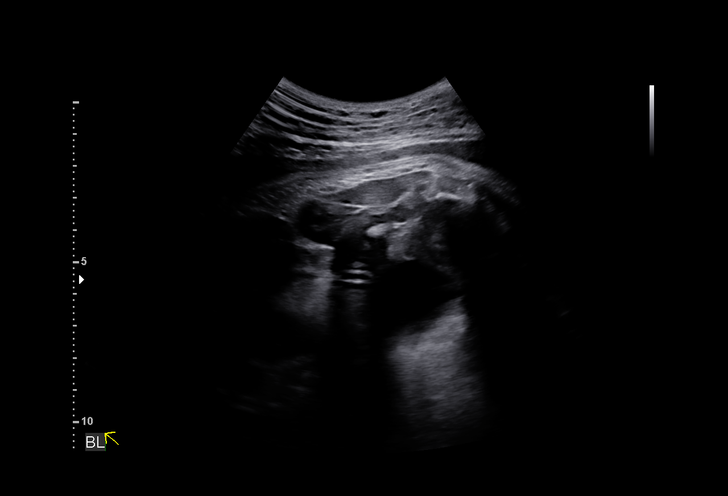
[im 9/14]
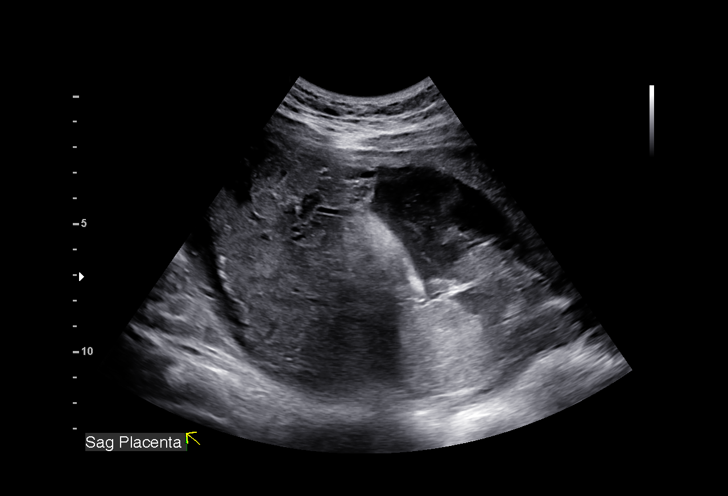
[im 10/14]
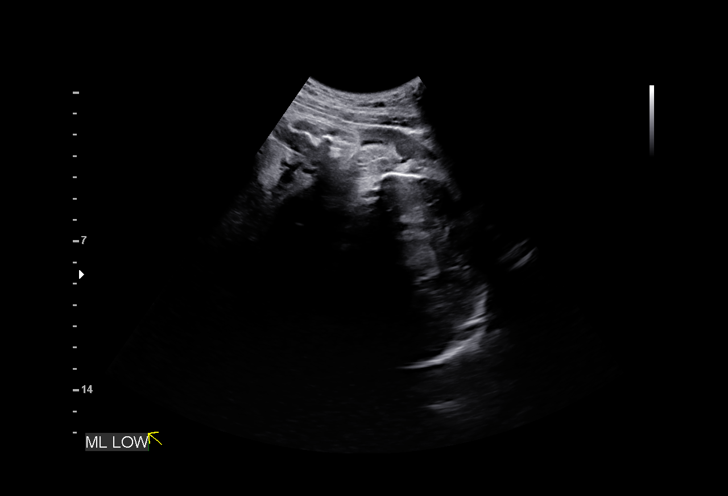
[im 11/14]
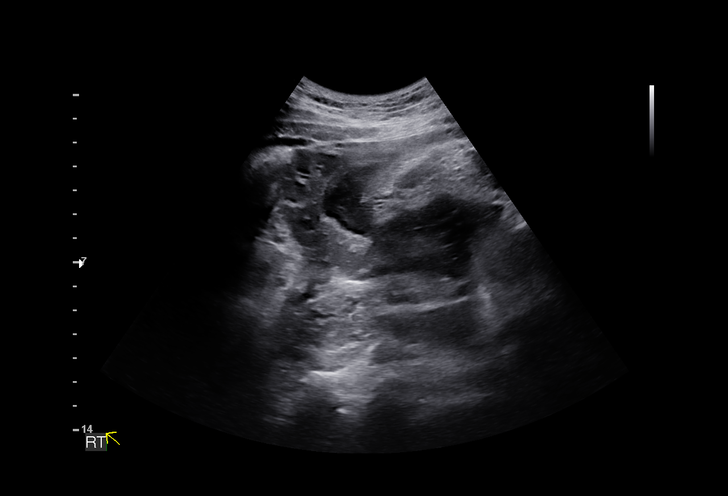
[im 12/14]
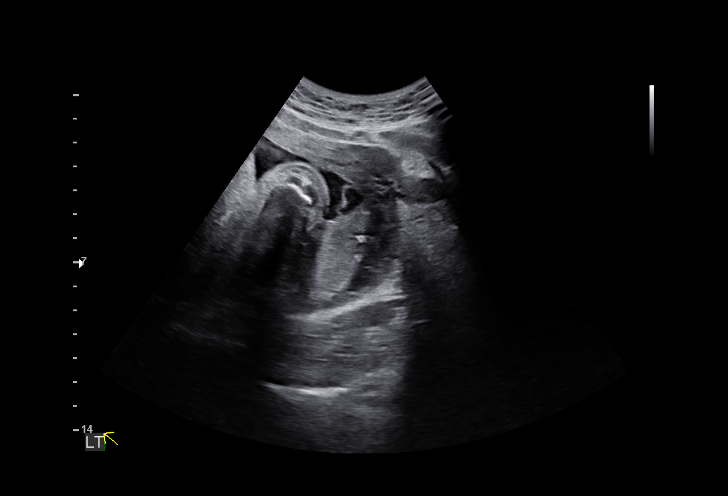
[im 13/14]
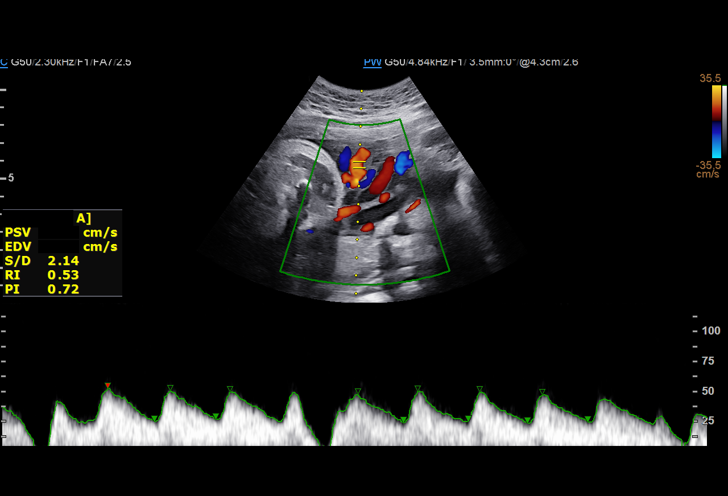
[im 14/14]
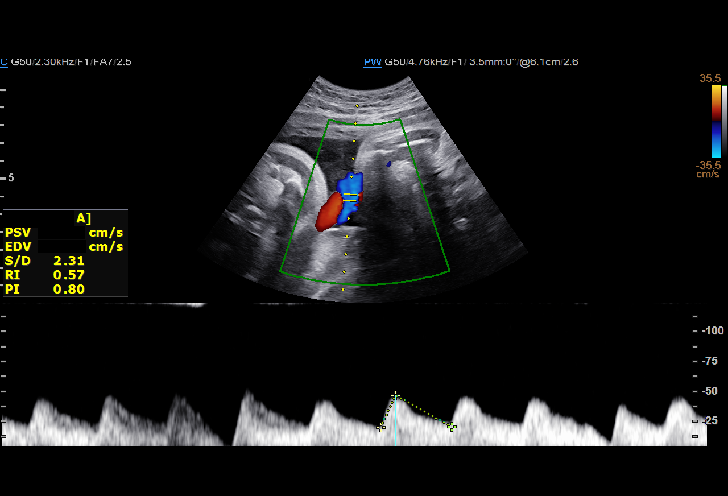

[14 of 14 positions shown; findings below may reference images not displayed]

[facility]                                [HOSPITAL] at

Indications

 37 weeks gestation of pregnancy
 Maternal care for known or suspected poor
 fetal growth, third trimester, not applicable or
 unspecified IUGR
 Cervical shortening, third trimester
 History of sickle cell trait
 Negative AFP/ LR NIPS
 Genetic carrier (Predseda Badakhsh, Sicle
 Cell, SMA)
 Encounter for other antenatal screening
 follow-up
 Covid-18 September, 2020
Fetal Evaluation

 Num Of Fetuses:         1
 Fetal Heart Rate(bpm):  144
 Cardiac Activity:       Observed
 Presentation:           Cephalic
 Placenta:               Posterior
 P. Cord Insertion:      Previously Visualized

 Amniotic Fluid
 AFI FV:      Within normal limits

 AFI Sum(cm)     %Tile       Largest Pocket(cm)
 15.79           60
 RUQ(cm)       RLQ(cm)       LUQ(cm)        LLQ(cm)

Biophysical Evaluation

 Amniotic F.V:   Within normal limits       F. Tone:        Observed
 F. Movement:    Observed                   Score:          [DATE]
 F. Breathing:   Observed
OB History

 Gravidity:    2         Term:   0        Prem:   0        SAB:   0
 TOP:          1       Ectopic:  0        Living: 0
Gestational Age

 LMP:           37w 1d        Date:  03/09/20                 EDD:   12/14/20
 Best:          37w 1d     Det. By:  LMP  (03/09/20)          EDD:   12/14/20
Anatomy

 Ventricles:            Appears normal         Kidneys:                Appear normal
 Heart:                 Appears normal         Bladder:                Appears normal
                        (4CH, axis, and
                        situs)
 Stomach:               Appears normal, left
                        sided

 Other:  A full anatomical study was previously performed.
Doppler - Fetal Vessels

 Umbilical Artery
  S/D     %tile      RI    %tile      PI    %tile
  2.22       42    0.55       46    0.75       39

Cervix Uterus Adnexa

 Cervix
 Not visualized (advanced GA >42wks)

 Right Ovary
 Not visualized.

 Left Ovary
 Not visualized.
Impression

 Patient returned for antenatal testing.  On previous
 ultrasound, the estimated fetal weight was at the 10th
 percentile.
 She had TDPM7-BN infection in August 2020.
 Amniotic fluid is normal and good fetal activity seen.
 Antenatal testing is reassuring.  BPP [DATE].
 Blood pressure today at her office is 105/78 mmHg.
 She will be undergoing induction of labor next week.
                 Bonner, Dayan

## 2021-07-20 ENCOUNTER — Encounter (HOSPITAL_COMMUNITY): Payer: Self-pay | Admitting: *Deleted

## 2021-07-20 ENCOUNTER — Emergency Department (HOSPITAL_COMMUNITY)
Admission: EM | Admit: 2021-07-20 | Discharge: 2021-07-20 | Disposition: A | Discharge status: Home / Self Care | Payer: Medicaid Other | Attending: Emergency Medicine | Admitting: Emergency Medicine

## 2021-07-20 ENCOUNTER — Other Ambulatory Visit: Payer: Self-pay

## 2021-07-20 DIAGNOSIS — Z20822 Contact with and (suspected) exposure to covid-19: Secondary | ICD-10-CM | POA: Diagnosis not present

## 2021-07-20 DIAGNOSIS — B349 Viral infection, unspecified: Secondary | ICD-10-CM | POA: Insufficient documentation

## 2021-07-20 DIAGNOSIS — R509 Fever, unspecified: Secondary | ICD-10-CM | POA: Diagnosis present

## 2021-07-20 DIAGNOSIS — D573 Sickle-cell trait: Secondary | ICD-10-CM | POA: Diagnosis not present

## 2021-07-20 LAB — COMPREHENSIVE METABOLIC PANEL
ALT: 14 U/L (ref 0–44)
AST: 18 U/L (ref 15–41)
Albumin: 3.6 g/dL (ref 3.5–5.0)
Alkaline Phosphatase: 67 U/L (ref 38–126)
Anion gap: 7 (ref 5–15)
BUN: <5 mg/dL — ABNORMAL LOW (ref 6–20)
CO2: 21 mmol/L — ABNORMAL LOW (ref 22–32)
Calcium: 8.7 mg/dL — ABNORMAL LOW (ref 8.9–10.3)
Chloride: 103 mmol/L (ref 98–111)
Creatinine, Ser: 0.72 mg/dL (ref 0.44–1.00)
GFR, Estimated: >60 mL/min (ref >60)
Glucose, Bld: 106 mg/dL — ABNORMAL HIGH (ref 70–99)
Potassium: 3.6 mmol/L (ref 3.5–5.1)
Sodium: 131 mmol/L — ABNORMAL LOW (ref 135–145)
Total Bilirubin: 0.9 mg/dL (ref 0.3–1.2)
Total Protein: 6.6 g/dL (ref 6.5–8.1)

## 2021-07-20 LAB — CBC WITH DIFFERENTIAL/PLATELET
Abs Immature Granulocytes: 0.04 10*3/uL (ref 0.00–0.07)
Basophils Absolute: 0 10*3/uL (ref 0.0–0.1)
Basophils Relative: 0 %
Eosinophils Absolute: 0 10*3/uL (ref 0.0–0.5)
Eosinophils Relative: 0 %
HCT: 34.3 % — ABNORMAL LOW (ref 36.0–46.0)
Hemoglobin: 11.1 g/dL — ABNORMAL LOW (ref 12.0–15.0)
Immature Granulocytes: 0 %
Lymphocytes Relative: 6 %
Lymphs Abs: 0.9 10*3/uL (ref 0.7–4.0)
MCH: 25.6 pg — ABNORMAL LOW (ref 26.0–34.0)
MCHC: 32.4 g/dL (ref 30.0–36.0)
MCV: 79.2 fL — ABNORMAL LOW (ref 80.0–100.0)
Monocytes Absolute: 0.8 10*3/uL (ref 0.1–1.0)
Monocytes Relative: 5 %
Neutro Abs: 12.7 10*3/uL — ABNORMAL HIGH (ref 1.7–7.7)
Neutrophils Relative %: 89 %
Platelets: 272 10*3/uL (ref 150–400)
RBC: 4.33 MIL/uL (ref 3.87–5.11)
RDW: 15 % (ref 11.5–15.5)
WBC: 14.4 10*3/uL — ABNORMAL HIGH (ref 4.0–10.5)
nRBC: 0 % (ref 0.0–0.2)

## 2021-07-20 LAB — RETICULOCYTES
Immature Retic Fract: 8.9 % (ref 2.3–15.9)
RBC.: 4.36 MIL/uL (ref 3.87–5.11)
Retic Count, Absolute: 53.6 10*3/uL (ref 19.0–186.0)
Retic Ct Pct: 1.2 % (ref 0.4–3.1)

## 2021-07-20 LAB — I-STAT BETA HCG BLOOD, ED (MC, WL, AP ONLY): I-stat hCG, quantitative: <5 m[IU]/mL (ref <5)

## 2021-07-20 LAB — RESP PANEL BY RT-PCR (FLU A&B, COVID) ARPGX2
Influenza A by PCR: NEGATIVE
Influenza B by PCR: NEGATIVE
SARS Coronavirus 2 by RT PCR: NEGATIVE

## 2021-07-20 LAB — LIPASE, BLOOD: Lipase: 24 U/L (ref 11–51)

## 2021-07-20 MED ORDER — LACTATED RINGERS IV BOLUS
1000.0000 mL | Freq: Once | INTRAVENOUS | Status: AC
  Administered 2021-07-20: 1000 mL via INTRAVENOUS

## 2021-07-20 MED ORDER — KETOROLAC TROMETHAMINE 30 MG/ML IJ SOLN
30.0000 mg | Freq: Once | INTRAMUSCULAR | Status: AC
  Administered 2021-07-20: 30 mg via INTRAVENOUS
  Filled 2021-07-20: qty 1

## 2021-07-20 NOTE — ED Notes (Signed)
Pt ambulatory to and from restroom  

## 2021-07-20 NOTE — ED Triage Notes (Signed)
Sickle cell crisis body aches nausea no vomiting  no diarrhea  lmp now

## 2021-07-20 NOTE — ED Notes (Signed)
The pt took tylenol for a temp of 101 while she was at work this evening

## 2021-07-20 NOTE — ED Notes (Signed)
Patient verbalizes understanding of discharge instructions. Opportunity for questioning and answers were provided. Armband removed by staff, pt discharged from ED ambulatory.   

## 2021-07-20 NOTE — ED Provider Notes (Signed)
[facility] EMERGENCY DEPARTMENT Provider Note    CSN: [id number] Arrival date & time: 07/20/21 1945  History Chief Complaint  Patient presents with   Generalized Body Aches    Heidi Martin is a 21 y.o. female with history of sickle trait and beta thal trait reports she was having generalized body aches and a fever today. She thought this might be due to her sickle cell. She took some APAP with improvement in fever, still having body aches. No cough, SOB, N/V/D or dysuria. No known sick contacts.She does not think she is pregnant.    Home Medications Prior to Admission medications   Medication Sig Start Date End Date Taking? Authorizing Provider  ibuprofen (ADVIL) 600 MG tablet Take 1 tablet (600 mg total) by mouth every 6 (six) hours. Patient not taking: Reported on 01/05/2021 12/01/20   Littie Deeds, MD  medroxyPROGESTERone (DEPO-PROVERA) 150 MG/ML injection Inject 1 mL (150 mg total) into the muscle every 3 (three) months. 01/05/21   Adam Phenix, MD  nitrofurantoin, macrocrystal-monohydrate, (MACROBID) 100 MG capsule Take 1 capsule (100 mg total) by mouth 2 (two) times daily. 02/12/21   Particia Nearing, PA-C  ondansetron (ZOFRAN ODT) 4 MG disintegrating tablet Take 1 tablet (4 mg total) by mouth every 8 (eight) hours as needed for nausea or vomiting. 05/11/21   Jeannie Fend, PA-C  Prenatal Vit-Fe Fumarate-FA (PRENATAL MULTIVITAMIN) TABS tablet Take 1 tablet by mouth daily at 12 noon. Patient not taking: Reported on 01/05/2021    [provider]  dicyclomine (BENTYL) 20 MG tablet Take 1 tablet (20 mg total) by mouth 2 (two) times daily. Patient not taking: No sig reported 11/14/19 02/14/20  Renne Crigler, PA-C  metoCLOPramide (REGLAN) 10 MG tablet Take 1 tablet (10 mg total) by mouth 3 (three) times daily between meals as needed for nausea. Patient not taking: Reported on 02/14/2020 10/04/19 02/14/20  Leftwich-Kirby, Wilmer Floor, CNM  promethazine (PHENERGAN) 12.5 MG tablet  Take 1-2 tablets (12.5-25 mg total) by mouth at bedtime. Patient not taking: Reported on 02/14/2020 10/04/19 02/14/20  Leftwich-Kirby, Wilmer Floor, CNM     Allergies    Milk [lac bovis]   Review of Systems   Review of Systems Please see HPI for pertinent positives and negatives  Physical Exam BP 100/62    Pulse (!) 104    Temp 99.8 F (37.7 C)    Resp 17    Ht 5\' 6"  (1.676 m)    Wt 72.6 kg    LMP 07/20/2021    SpO2 100%    BMI 25.83 kg/m   Physical Exam Vitals and nursing note reviewed.  Constitutional:      Appearance: Normal appearance.  HENT:     Head: Normocephalic and atraumatic.     Nose: Nose normal.     Mouth/Throat:     Mouth: Mucous membranes are moist.     Pharynx: No oropharyngeal exudate or posterior oropharyngeal erythema.  Eyes:     Extraocular Movements: Extraocular movements intact.     Conjunctiva/sclera: Conjunctivae normal.  Cardiovascular:     Rate and Rhythm: Normal rate.  Pulmonary:     Effort: Pulmonary effort is normal.     Breath sounds: Normal breath sounds.  Abdominal:     General: Abdomen is flat.     Palpations: Abdomen is soft.     Tenderness: There is no abdominal tenderness.  Musculoskeletal:        General: No swelling. Normal range of motion.  Cervical back: Neck supple.  Skin:    General: Skin is warm and dry.  Neurological:     General: No focal deficit present.     Mental Status: She is alert.  Psychiatric:        Mood and Affect: Mood normal.    ED Results / Procedures / Treatments   EKG None  Procedures Procedures  Medications Ordered in the ED Medications  lactated ringers bolus 1,000 mL (0 mLs Intravenous Stopped 07/20/21 2202)  ketorolac (TORADOL) 30 MG/ML injection 30 mg (30 mg Intravenous Given 07/20/21 2139)    Initial Impression and Plan  Patient with sickle trait does not have SS Disease. She is here for fever and body aches. Likely a viral process. Labs ordered in triage (presuming sickle pain crisis) are  pending. Will add Covid swab. Plan IVF and Toradol if not pregnant.   ED Course   Clinical Course as of 07/20/21 2218  Wed Jul 20, 2021  2053 CBC without significant anemia. Mild leukocytosis. Retics are normal.  [CS]  2114 CMP lipase are neg.  [CS]  2129 HCG is neg. Toradol ordered for pain.  [CS]  2145 Covid/Flu is neg.  [CS]  2216 Patient reports she is feeling better after after IVF and meds and is ready to go home. Advised to continue motrin, APAP for myalgias and fever. Drink plenty of fluid. RTED or follow up with PCP for any other concerning symptoms.  [CS]    Clinical Course User Index [CS] Pollyann Savoy, MD     MDM Rules/Calculators/A&P Medical Decision Making Problems Addressed: Sickle cell trait [facility]): chronic illness or injury Viral syndrome: acute illness or injury with systemic symptoms  Amount and/or Complexity of Data Reviewed External Data Reviewed: labs. Labs: ordered. Decision-making details documented in ED Course. Radiology: ordered and independent interpretation performed. Decision-making details documented in ED Course. ECG/medicine tests: ordered and independent interpretation performed. Decision-making details documented in ED Course.  Risk Prescription drug management.    Final Clinical Impression(s) / ED Diagnoses Final diagnoses:  Viral syndrome  Sickle cell trait [facility])    Rx / DC Orders ED Discharge Orders     None        Pollyann Savoy, MD 07/20/21 2218

## 2021-07-21 ENCOUNTER — Emergency Department (HOSPITAL_COMMUNITY)
Admission: EM | Admit: 2021-07-21 | Discharge: 2021-07-21 | Disposition: A | Discharge status: Home / Self Care | Payer: Medicaid Other | Attending: Emergency Medicine | Admitting: Emergency Medicine

## 2021-07-21 ENCOUNTER — Encounter (HOSPITAL_COMMUNITY): Payer: Self-pay | Admitting: Emergency Medicine

## 2021-07-21 ENCOUNTER — Emergency Department (HOSPITAL_COMMUNITY): Payer: Medicaid Other

## 2021-07-21 DIAGNOSIS — R000 Tachycardia, unspecified: Secondary | ICD-10-CM | POA: Insufficient documentation

## 2021-07-21 DIAGNOSIS — M2550 Pain in unspecified joint: Secondary | ICD-10-CM | POA: Diagnosis not present

## 2021-07-21 DIAGNOSIS — R519 Headache, unspecified: Secondary | ICD-10-CM

## 2021-07-21 DIAGNOSIS — H53149 Visual discomfort, unspecified: Secondary | ICD-10-CM | POA: Diagnosis not present

## 2021-07-21 DIAGNOSIS — B349 Viral infection, unspecified: Secondary | ICD-10-CM | POA: Diagnosis not present

## 2021-07-21 LAB — CBC WITH DIFFERENTIAL/PLATELET
Abs Immature Granulocytes: 0.04 10*3/uL (ref 0.00–0.07)
Basophils Absolute: 0 10*3/uL (ref 0.0–0.1)
Basophils Relative: 0 %
Eosinophils Absolute: 0 10*3/uL (ref 0.0–0.5)
Eosinophils Relative: 0 %
HCT: 32.8 % — ABNORMAL LOW (ref 36.0–46.0)
Hemoglobin: 10.7 g/dL — ABNORMAL LOW (ref 12.0–15.0)
Immature Granulocytes: 0 %
Lymphocytes Relative: 10 %
Lymphs Abs: 1.2 10*3/uL (ref 0.7–4.0)
MCH: 25.8 pg — ABNORMAL LOW (ref 26.0–34.0)
MCHC: 32.6 g/dL (ref 30.0–36.0)
MCV: 79 fL — ABNORMAL LOW (ref 80.0–100.0)
Monocytes Absolute: 0.6 10*3/uL (ref 0.1–1.0)
Monocytes Relative: 5 %
Neutro Abs: 10 10*3/uL — ABNORMAL HIGH (ref 1.7–7.7)
Neutrophils Relative %: 85 %
Platelets: 243 10*3/uL (ref 150–400)
RBC: 4.15 MIL/uL (ref 3.87–5.11)
RDW: 15 % (ref 11.5–15.5)
WBC: 11.9 10*3/uL — ABNORMAL HIGH (ref 4.0–10.5)
nRBC: 0 % (ref 0.0–0.2)

## 2021-07-21 LAB — URINALYSIS, ROUTINE W REFLEX MICROSCOPIC
Bilirubin Urine: NEGATIVE
Glucose, UA: 100 mg/dL — AB
Ketones, ur: NEGATIVE mg/dL
Leukocytes,Ua: NEGATIVE
Nitrite: NEGATIVE
Protein, ur: 30 mg/dL — AB
Specific Gravity, Urine: <1.005 — ABNORMAL LOW (ref 1.005–1.030)
pH: 6.5 (ref 5.0–8.0)

## 2021-07-21 LAB — URINALYSIS, MICROSCOPIC (REFLEX)

## 2021-07-21 LAB — COMPREHENSIVE METABOLIC PANEL
ALT: 13 U/L (ref 0–44)
AST: 18 U/L (ref 15–41)
Albumin: 3.5 g/dL (ref 3.5–5.0)
Alkaline Phosphatase: 66 U/L (ref 38–126)
Anion gap: 8 (ref 5–15)
BUN: 6 mg/dL (ref 6–20)
CO2: 23 mmol/L (ref 22–32)
Calcium: 8.5 mg/dL — ABNORMAL LOW (ref 8.9–10.3)
Chloride: 106 mmol/L (ref 98–111)
Creatinine, Ser: 0.98 mg/dL (ref 0.44–1.00)
GFR, Estimated: >60 mL/min (ref >60)
Glucose, Bld: 104 mg/dL — ABNORMAL HIGH (ref 70–99)
Potassium: 3.3 mmol/L — ABNORMAL LOW (ref 3.5–5.1)
Sodium: 137 mmol/L (ref 135–145)
Total Bilirubin: 0.6 mg/dL (ref 0.3–1.2)
Total Protein: 6.3 g/dL — ABNORMAL LOW (ref 6.5–8.1)

## 2021-07-21 LAB — MONONUCLEOSIS SCREEN: Mono Screen: NEGATIVE

## 2021-07-21 LAB — HCG, SERUM, QUALITATIVE: Preg, Serum: NEGATIVE

## 2021-07-21 LAB — CK: Total CK: 79 U/L (ref 38–234)

## 2021-07-21 MED ORDER — BUTALBITAL-APAP-CAFFEINE 50-325-40 MG PO TABS
1.0000 | ORAL_TABLET | Freq: Four times a day (QID) | ORAL | 0 refills | Status: DC | PRN
Start: 2021-07-21 — End: 2021-10-29

## 2021-07-21 MED ORDER — SODIUM CHLORIDE 0.9 % IV BOLUS
1000.0000 mL | Freq: Once | INTRAVENOUS | Status: AC
  Administered 2021-07-21: 1000 mL via INTRAVENOUS

## 2021-07-21 MED ORDER — METOCLOPRAMIDE HCL 5 MG/ML IJ SOLN
5.0000 mg | Freq: Once | INTRAMUSCULAR | Status: AC
Start: 2021-07-21 — End: 2021-07-21
  Administered 2021-07-21: 5 mg via INTRAVENOUS
  Filled 2021-07-21: qty 2

## 2021-07-21 MED ORDER — LIDOCAINE 5 % EX PTCH
1.0000 | MEDICATED_PATCH | Freq: Every day | CUTANEOUS | 0 refills | Status: DC | PRN
Start: 2021-07-21 — End: 2022-04-15

## 2021-07-21 MED ORDER — POTASSIUM CHLORIDE CRYS ER 20 MEQ PO TBCR
40.0000 meq | EXTENDED_RELEASE_TABLET | Freq: Once | ORAL | Status: AC
  Administered 2021-07-21: 40 meq via ORAL
  Filled 2021-07-21: qty 2

## 2021-07-21 MED ORDER — DIPHENHYDRAMINE HCL 50 MG/ML IJ SOLN
25.0000 mg | Freq: Once | INTRAMUSCULAR | Status: AC
  Administered 2021-07-21: 25 mg via INTRAVENOUS
  Filled 2021-07-21: qty 1

## 2021-07-21 MED ORDER — MAGNESIUM SULFATE 2 GM/50ML IV SOLN
2.0000 g | Freq: Once | INTRAVENOUS | Status: AC
  Administered 2021-07-21: 2 g via INTRAVENOUS
  Filled 2021-07-21: qty 50

## 2021-07-21 MED ORDER — ACETAMINOPHEN 325 MG PO TABS
650.0000 mg | ORAL_TABLET | Freq: Once | ORAL | Status: AC
  Administered 2021-07-21: 650 mg via ORAL
  Filled 2021-07-21: qty 2

## 2021-07-21 MED ORDER — ONDANSETRON HCL 4 MG/2ML IJ SOLN
4.0000 mg | Freq: Once | INTRAMUSCULAR | Status: AC
  Administered 2021-07-21: 4 mg via INTRAVENOUS
  Filled 2021-07-21: qty 2

## 2021-07-21 MED ORDER — MORPHINE SULFATE (PF) 4 MG/ML IV SOLN
4.0000 mg | Freq: Once | INTRAVENOUS | Status: AC
  Administered 2021-07-21: 4 mg via INTRAVENOUS
  Filled 2021-07-21: qty 1

## 2021-07-21 MED ORDER — KETOROLAC TROMETHAMINE 15 MG/ML IJ SOLN
15.0000 mg | Freq: Once | INTRAMUSCULAR | Status: AC
  Administered 2021-07-21: 15 mg via INTRAVENOUS
  Filled 2021-07-21: qty 1

## 2021-07-21 MED ORDER — KETOROLAC TROMETHAMINE 30 MG/ML IJ SOLN
30.0000 mg | Freq: Once | INTRAMUSCULAR | Status: AC
  Administered 2021-07-21: 30 mg via INTRAVENOUS
  Filled 2021-07-21: qty 1

## 2021-07-21 NOTE — ED Triage Notes (Signed)
Pt. Stated, I was just here last night and Im no better. I have body aches all over and they are still here.

## 2021-07-21 NOTE — Discharge Instructions (Addendum)
It was a pleasure caring for you today in the emergency department. ° °Please return to the emergency department for any worsening or worrisome symptoms. ° ° °

## 2021-07-21 NOTE — ED Provider Notes (Signed)
[facility] EMERGENCY DEPARTMENT Provider Note   CSN: [id number] Arrival date & time: 07/21/21  [id number]     History  Chief Complaint  Patient presents with   Generalized Body Aches    Heidi Martin is a 21 y.o. female.  This is a 21 y.o. female with significant medical history as below, including IBS, sickle cell trait who presents to the ED with complaint of myalgias, arthralgias, headache.  Patient ports onset of symptoms yesterday, she is having diffuse body aches.  Had fever at work, took Tylenol, fever resolved.  She was seen in the emergency department last night, labs and COVID-19 testing was stable.  She was given IV fluids and Toradol with symptomatic improvement.  Patient went home and she had resumption of her symptoms.  T-max last night 103.  She took Tylenol around midnight which mildly improved her symptoms.  Is not taking any other analgesics or antipyretics since then.  She is tolerant oral intake without difficulty.  No vomiting.  She is having some diffuse arthralgias, myalgias.  No rashes.  No chills.  No cough, congestion, rhinorrhea, sore throat.  No change in bowel or bladder function.  No recent travel or sick contacts.  Patient does report history of recurrent headaches, she is having a headache at this time.  Headache not described as the worst of her life.  Headache localized to the frontal portion of her head, described as throbbing, pulsing, associate with photophobia and phonophobia.  Mild nausea.  Similar to prior headaches.  No numbness, tingling, visual disturbance.  No gait disturbance.  She takes OTC medication for these headaches.    Past Medical History: No date: COVID-19 No date: Irritable bowel syndrome (IBS) No date: Sickle cell trait ([place])     The history is provided by the patient. No language interpreter was used.      Home Medications Prior to Admission medications   Medication Sig Start Date End Date Taking? Authorizing  Provider  acetaminophen (TYLENOL) 500 MG tablet Take 500 mg by mouth every 6 (six) hours as needed for mild pain.   Yes [provider]  butalbital-acetaminophen-caffeine (FIORICET) 50-325-40 MG tablet Take 1 tablet by mouth every 6 (six) hours as needed for headache. 07/21/21  Yes Wynona Dove A, DO  lidocaine (LIDODERM) 5 % Place 1 patch onto the skin daily as needed for up to 15 doses. Remove & Discard patch within 12 hours or as directed by MD 07/21/21  Yes Jeanell Sparrow, DO  medroxyPROGESTERone (PROVERA) 10 MG tablet Take 10 mg by mouth daily. Until bleeding stops 07/19/21  Yes [provider]  Prenatal Vit-Fe Fumarate-FA (PRENATAL MULTIVITAMIN) TABS tablet Take 1 tablet by mouth daily at 12 noon.   Yes [provider]  medroxyPROGESTERone (DEPO-PROVERA) 150 MG/ML injection Inject 1 mL (150 mg total) into the muscle every 3 (three) months. Patient not taking: Reported on 07/21/2021 01/05/21   Woodroe Mode, MD  nitrofurantoin, macrocrystal-monohydrate, (MACROBID) 100 MG capsule Take 1 capsule (100 mg total) by mouth 2 (two) times daily. Patient not taking: Reported on 07/21/2021 02/12/21   Volney American, PA-C  ondansetron (ZOFRAN ODT) 4 MG disintegrating tablet Take 1 tablet (4 mg total) by mouth every 8 (eight) hours as needed for nausea or vomiting. Patient not taking: Reported on 07/21/2021 05/11/21   Suella Broad A, PA-C  dicyclomine (BENTYL) 20 MG tablet Take 1 tablet (20 mg total) by mouth 2 (two) times daily. Patient not taking: No  sig reported 11/14/19 02/14/20  Carlisle Cater, PA-C  metoCLOPramide (REGLAN) 10 MG tablet Take 1 tablet (10 mg total) by mouth 3 (three) times daily between meals as needed for nausea. Patient not taking: Reported on 02/14/2020 10/04/19 02/14/20  Leftwich-Kirby, Kathie Dike, CNM  promethazine (PHENERGAN) 12.5 MG tablet Take 1-2 tablets (12.5-25 mg total) by mouth at bedtime. Patient not taking: Reported on 02/14/2020 10/04/19 02/14/20   Leftwich-Kirby, Kathie Dike, CNM      Allergies    Milk [lac bovis]    Review of Systems   Review of Systems  Constitutional:  Negative for chills and fever.  HENT:  Negative for facial swelling and trouble swallowing.   Eyes:  Negative for photophobia and visual disturbance.  Respiratory:  Negative for cough and shortness of breath.   Cardiovascular:  Negative for chest pain and palpitations.  Gastrointestinal:  Positive for nausea. Negative for abdominal pain and vomiting.  Endocrine: Negative for polydipsia and polyuria.  Genitourinary:  Negative for difficulty urinating and hematuria.  Musculoskeletal:  Positive for arthralgias and myalgias. Negative for gait problem and joint swelling.  Skin:  Negative for pallor and rash.  Neurological:  Positive for headaches. Negative for syncope.  Psychiatric/Behavioral:  Negative for agitation and confusion.    Physical Exam Updated Vital Signs BP 107/71    Pulse 79    Temp 99.3 F (37.4 C) (Oral)    Resp (!) 22    LMP 07/20/2021    SpO2 98%  Physical Exam Vitals and nursing note reviewed.  Constitutional:      General: She is not in acute distress.    Appearance: Normal appearance.  HENT:     Head: Normocephalic and atraumatic.     Right Ear: External ear normal.     Left Ear: External ear normal.     Nose: Nose normal.     Mouth/Throat:     Mouth: Mucous membranes are moist.  Eyes:     General: No scleral icterus.       Right eye: No discharge.        Left eye: No discharge.     Extraocular Movements: Extraocular movements intact.     Pupils: Pupils are equal, round, and reactive to light.  Cardiovascular:     Rate and Rhythm: Regular rhythm. Tachycardia present.     Pulses: Normal pulses.     Heart sounds: Normal heart sounds.  Pulmonary:     Effort: Pulmonary effort is normal. No respiratory distress.     Breath sounds: Normal breath sounds.  Abdominal:     General: Abdomen is flat.     Palpations: Abdomen is soft.      Tenderness: There is no abdominal tenderness.  Musculoskeletal:        General: Normal range of motion.     Cervical back: Normal range of motion.     Right lower leg: No edema.     Left lower leg: No edema.  Skin:    General: Skin is warm and dry.     Capillary Refill: Capillary refill takes less than 2 seconds.  Neurological:     Mental Status: She is alert and oriented to person, place, and time.     GCS: GCS eye subscore is 4. GCS verbal subscore is 5. GCS motor subscore is 6.     Cranial Nerves: Cranial nerves 2-12 are intact.     Sensory: Sensation is intact.     Motor: No weakness or tremor.  Coordination: Coordination is intact.     Gait: Gait is intact.  Psychiatric:        Mood and Affect: Mood normal.        Behavior: Behavior normal.    ED Results / Procedures / Treatments   Labs (all labs ordered are listed, but only abnormal results are displayed) Labs Reviewed  CBC WITH DIFFERENTIAL/PLATELET - Abnormal; Notable for the following components:      Result Value   WBC 11.9 (*)    Hemoglobin 10.7 (*)    HCT 32.8 (*)    MCV 79.0 (*)    MCH 25.8 (*)    Neutro Abs 10.0 (*)    All other components within normal limits  COMPREHENSIVE METABOLIC PANEL - Abnormal; Notable for the following components:   Potassium 3.3 (*)    Glucose, Bld 104 (*)    Calcium 8.5 (*)    Total Protein 6.3 (*)    All other components within normal limits  URINALYSIS, ROUTINE W REFLEX MICROSCOPIC - Abnormal; Notable for the following components:   Specific Gravity, Urine <1.005 (*)    Glucose, UA 100 (*)    Hgb urine dipstick LARGE (*)    Protein, ur 30 (*)    All other components within normal limits  URINALYSIS, MICROSCOPIC (REFLEX) - Abnormal; Notable for the following components:   Bacteria, UA RARE (*)    All other components within normal limits  CK  MONONUCLEOSIS SCREEN  HCG, SERUM, QUALITATIVE    EKG EKG Interpretation  Date/Time:  Thursday July 21 2021 09:43:19  EST Ventricular Rate:  82 PR Interval:  156 QRS Duration: 94 QT Interval:  380 QTC Calculation: 444 R Axis:   44 Text Interpretation: Sinus rhythm Probable left atrial enlargement Low voltage, precordial leads RSR' in V1 or V2, probably normal variant no prior tracing no stemi Confirmed by Wynona Dove ([id number]) on 07/21/2021 9:47:21 AM  Radiology CT HEAD WO CONTRAST (5MM)  Result Date: 07/21/2021 CLINICAL DATA:  Headache, chronic, new features or increased frequency EXAM: CT HEAD WITHOUT CONTRAST TECHNIQUE: Contiguous axial images were obtained from the base of the skull through the vertex without intravenous contrast. RADIATION DOSE REDUCTION: This exam was performed according to the departmental dose-optimization program which includes automated exposure control, adjustment of the mA and/or kV according to patient size and/or use of iterative reconstruction technique. COMPARISON:  September 2021 FINDINGS: Brain: There is no acute intracranial hemorrhage, mass effect, or edema. Kareema Keitt-white differentiation is preserved. There is no extra-axial fluid collection. Ventricles and sulci are within normal limits in size and configuration. Vascular: No hyperdense vessel or unexpected calcification. Skull: Calvarium is unremarkable. Sinuses/Orbits: No acute finding. Other: None. IMPRESSION: No acute intracranial abnormality. Electronically Signed   By: Macy Mis M.D.   On: 07/21/2021 14:13   DG Chest Portable 1 View  Result Date: 07/21/2021 CLINICAL DATA:  21 year old female with sickle cell disease. Body ache, pain since yesterday. EXAM: PORTABLE CHEST 1 VIEW COMPARISON:  Portable chest 09/12/2019. FINDINGS: Portable AP semi upright view at 0815 hours. Lung volumes and mediastinal contours are stable and within normal limits. Visualized tracheal air column is within normal limits. Allowing for portable technique the lungs are clear. No pneumothorax. No osseous abnormality identified. Negative visible bowel  gas. IMPRESSION: Negative portable chest. Electronically Signed   By: Genevie Ann M.D.   On: 07/21/2021 08:29    Procedures Procedures    Medications Ordered in ED Medications  ketorolac (TORADOL) 15 MG/ML injection 15 mg (  has no administration in time range)  sodium chloride 0.9 % bolus 1,000 mL (0 mLs Intravenous Stopped 07/21/21 1527)  acetaminophen (TYLENOL) tablet 650 mg (650 mg Oral Given 07/21/21 0959)  diphenhydrAMINE (BENADRYL) injection 25 mg (25 mg Intravenous Given 07/21/21 1052)  metoCLOPramide (REGLAN) injection 5 mg (5 mg Intravenous Given 07/21/21 1052)  ketorolac (TORADOL) 30 MG/ML injection 30 mg (30 mg Intravenous Given 07/21/21 1053)  magnesium sulfate IVPB 2 g 50 mL (0 g Intravenous Stopped 07/21/21 1203)  potassium chloride SA (KLOR-CON M) CR tablet 40 mEq (40 mEq Oral Given 07/21/21 1047)  morphine 4 MG/ML injection 4 mg (4 mg Intravenous Given 07/21/21 1344)  ondansetron (ZOFRAN) injection 4 mg (4 mg Intravenous Given 07/21/21 1343)  sodium chloride 0.9 % bolus 1,000 mL (0 mLs Intravenous Stopped 07/21/21 1527)    ED Course/ Medical Decision Making/ A&P Clinical Course as of 07/21/21 1528  Thu Jul 21, 2021  0944 Hemoglobin(!): 10.7 Mildly reduced hemoglobin, patient is currently menstruating. [SG]    Clinical Course User Index [SG] Jeanell Sparrow, DO                           Medical Decision Making Amount and/or Complexity of Data Reviewed External Data Reviewed: labs, radiology and notes. Labs: ordered. Decision-making details documented in ED Course. Radiology: ordered. Decision-making details documented in ED Course.  Risk OTC drugs. Prescription drug management.    CC: Myalgias, arthralgias, headache, fever  This patient complains of above; this involves an extensive number of treatment options and is a complaint that carries with it a high risk of complications and morbidity. Vital signs were reviewed. Serious etiologies considered.  Record review:   Previous records obtained and reviewed    Work up as above, notable for:  Labs & imaging results that were available during my care of the patient were reviewed by me and considered in my medical decision making.   I ordered imaging studies which included chest x-ray and I independently visualized and interpreted imaging which showed no acute process  Cardiac monitoring reviewed and interpreted personally which shows normal sinus rhythm  Social determinants of health include -no PCP  Management: IV fluids, analgesics, headache cocktail  Reassessment:  Patient reports improvement to her symptoms.  Patient is ambulatory with a steady gait.  Repeat neurologic exam is nonfocal.  She is tolerating oral intake without difficulty.  No nausea or vomiting.  No changes to bowel or bladder function.  Patient does have history of recurrent headaches, she has not seen neurology for these headaches.  Recommend patient follow-up with neurology as an outpatient.  Ambulatory referral has been ordered.  Patient started on Fioricet.   Patient presents with headache. Based on the patient's history and physical there is very low clinical suspicion for significant intracranial pathology. The headache was not sudden onset, not maximal at onset, there are no neurologic findings on exam, the patient does not have a fever, the patient does not have any jaw claudication, the patient does not endorse a clotting disorder, patient denies any trauma or eye pain and the headache is not associated with dizziness, weakness on one side of the body, diplopia, vertigo, slurred speech, or ataxia. Given the extremely low risk of these diagnoses further testing and evaluation for these possibilities does not appear to be indicated at this time.   Patient with likely viral syndrome.   The patient improved significantly and was discharged in stable condition. Detailed  discussions were had with the patient regarding current  findings, and need for close f/u with PCP or on call doctor. The patient has been instructed to return immediately if the symptoms worsen in any way for re-evaluation. Patient verbalized understanding and is in agreement with current care plan. All questions answered prior to discharge.      This chart was dictated using voice recognition software.  Despite best efforts to proofread,  errors can occur which can change the documentation meaning.         Final Clinical Impression(s) / ED Diagnoses Final diagnoses:  Viral illness  Nonintractable headache, unspecified chronicity pattern, unspecified headache type    Rx / DC Orders ED Discharge Orders          Ordered    Ambulatory referral to Neurology       Comments: An appointment is requested in approximately: 1 week   07/21/21 1525    butalbital-acetaminophen-caffeine (FIORICET) 50-325-40 MG tablet  Every 6 hours PRN        07/21/21 1525    lidocaine (LIDODERM) 5 %  Daily PRN        07/21/21 1525              Jeanell Sparrow, DO 07/21/21 1528

## 2021-07-21 NOTE — ED Notes (Signed)
Patient transported to CT 

## 2021-08-30 ENCOUNTER — Encounter (HOSPITAL_COMMUNITY): Payer: Self-pay | Admitting: Emergency Medicine

## 2021-08-30 ENCOUNTER — Emergency Department (HOSPITAL_COMMUNITY)
Admission: EM | Admit: 2021-08-30 | Discharge: 2021-08-30 | Disposition: A | Discharge status: Home / Self Care | Payer: Medicaid Other | Attending: Emergency Medicine | Admitting: Emergency Medicine

## 2021-08-30 DIAGNOSIS — R112 Nausea with vomiting, unspecified: Secondary | ICD-10-CM | POA: Diagnosis present

## 2021-08-30 DIAGNOSIS — B349 Viral infection, unspecified: Secondary | ICD-10-CM | POA: Diagnosis not present

## 2021-08-30 DIAGNOSIS — Z20822 Contact with and (suspected) exposure to covid-19: Secondary | ICD-10-CM | POA: Insufficient documentation

## 2021-08-30 LAB — RESP PANEL BY RT-PCR (FLU A&B, COVID) ARPGX2
Influenza A by PCR: NEGATIVE
Influenza B by PCR: NEGATIVE
SARS Coronavirus 2 by RT PCR: NEGATIVE

## 2021-08-30 LAB — GROUP A STREP BY PCR: Group A Strep by PCR: NOT DETECTED

## 2021-08-30 MED ORDER — ONDANSETRON 4 MG PO TBDP
8.0000 mg | ORAL_TABLET | Freq: Once | ORAL | Status: AC
  Administered 2021-08-30: 8 mg via ORAL
  Filled 2021-08-30: qty 2

## 2021-08-30 MED ORDER — PROCHLORPERAZINE EDISYLATE 10 MG/2ML IJ SOLN
5.0000 mg | Freq: Once | INTRAMUSCULAR | Status: AC
  Administered 2021-08-30: 5 mg via INTRAVENOUS
  Filled 2021-08-30: qty 2

## 2021-08-30 MED ORDER — SODIUM CHLORIDE 0.9 % IV BOLUS
1000.0000 mL | Freq: Once | INTRAVENOUS | Status: AC
  Administered 2021-08-30: 1000 mL via INTRAVENOUS

## 2021-08-30 MED ORDER — ONDANSETRON 4 MG PO TBDP: 4.0000 mg | ORAL_TABLET | Freq: Three times a day (TID) | ORAL | 0 refills | Status: DC | PRN

## 2021-08-30 MED ORDER — ACETAMINOPHEN 500 MG PO TABS
500.0000 mg | ORAL_TABLET | Freq: Once | ORAL | Status: AC
  Administered 2021-08-30: 500 mg via ORAL
  Filled 2021-08-30: qty 1

## 2021-08-30 NOTE — ED Triage Notes (Signed)
Patient here with complaint of nausea, vomiting, headaches, and sore throat x2 days. Patient afebrile in triage, is alert, speaking in complete sentences, oriented, and in no apparent distress at this time.

## 2021-08-30 NOTE — ED Provider Notes (Signed)
[facility] Provider Note   CSN: [id number] Arrival date & time: 08/30/21  1202     History  Chief Complaint  Patient presents with   Emesis    Heidi Martin is a 21 y.o. female.   Emesis Associated symptoms: headaches and sore throat   Associated symptoms: no cough and no diarrhea   Patient presents with about 3-day history of aching sore throat nausea and vomiting.  Sore throat started today but is felt bad and vomiting for couple days.  Also has a headache.  Has had headache before.  States really this headache is not that much different from before.  No fevers or chills.  Has had sick contacts with the family number but states they are not throwing up or feeling like her.  States she had a negative pregnancy test in the last 2 days at home    Home Medications Prior to Admission medications   Medication Sig Start Date End Date Taking? Authorizing Provider  acetaminophen (TYLENOL) 500 MG tablet Take 500 mg by mouth every 6 (six) hours as needed for mild pain.    [provider]  butalbital-acetaminophen-caffeine (FIORICET) 50-325-40 MG tablet Take 1 tablet by mouth every 6 (six) hours as needed for headache. 07/21/21   Tanda RockersGray, Samuel A, DO  lidocaine (LIDODERM) 5 % Place 1 patch onto the skin daily as needed for up to 15 doses. Remove & Discard patch within 12 hours or as directed by MD 07/21/21   Tanda RockersGray, Samuel A, DO  medroxyPROGESTERone (DEPO-PROVERA) 150 MG/ML injection Inject 1 mL (150 mg total) into the muscle every 3 (three) months. Patient not taking: Reported on 07/21/2021 01/05/21   Adam PhenixArnold, James G, MD  medroxyPROGESTERone (PROVERA) 10 MG tablet Take 10 mg by mouth daily. Until bleeding stops 07/19/21   [provider]  nitrofurantoin, macrocrystal-monohydrate, (MACROBID) 100 MG capsule Take 1 capsule (100 mg total) by mouth 2 (two) times daily. Patient not taking: Reported on 07/21/2021 02/12/21   Particia NearingLane, Rachel Elizabeth, PA-C   ondansetron [facility](ZOFRAN ODT) 4 MG disintegrating tablet Take 1 tablet (4 mg total) by mouth every 8 (eight) hours as needed for nausea or vomiting. 08/30/21   Benjiman CorePickering, Jiraiya Mcewan, MD  Prenatal Vit-Fe Fumarate-FA (PRENATAL MULTIVITAMIN) TABS tablet Take 1 tablet by mouth daily at 12 noon.    [provider]  dicyclomine (BENTYL) 20 MG tablet Take 1 tablet (20 mg total) by mouth 2 (two) times daily. Patient not taking: No sig reported 11/14/19 02/14/20  Renne CriglerGeiple, Joshua, PA-C  metoCLOPramide (REGLAN) 10 MG tablet Take 1 tablet (10 mg total) by mouth 3 (three) times daily between meals as needed for nausea. Patient not taking: Reported on 02/14/2020 10/04/19 02/14/20  Leftwich-Kirby, Wilmer FloorLisa A, CNM  promethazine (PHENERGAN) 12.5 MG tablet Take 1-2 tablets (12.5-25 mg total) by mouth at bedtime. Patient not taking: Reported on 02/14/2020 10/04/19 02/14/20  Leftwich-Kirby, Wilmer FloorLisa A, CNM      Allergies    Milk [lac bovis]    Review of Systems   Review of Systems  Constitutional:  Positive for appetite change.  HENT:  Positive for sore throat.   Respiratory:  Negative for cough.   Gastrointestinal:  Positive for nausea and vomiting. Negative for diarrhea.  Neurological:  Positive for headaches.   Physical Exam Updated Vital Signs BP 120/75 (BP Location: Right Arm)    Pulse 87    Temp 98.9 F (37.2 C) (Oral)    Resp 16    SpO2 100%  Physical Exam HENT:     Mouth/Throat:     Comments: Posterior pharyngeal erythema without exudate. Pulmonary:     Breath sounds: No wheezing or rhonchi.  Abdominal:     Tenderness: There is no abdominal tenderness.  Musculoskeletal:        General: No tenderness.     Cervical back: Neck supple.  Skin:    General: Skin is warm.     Capillary Refill: Capillary refill takes less than 2 seconds.  Neurological:     Mental Status: She is alert.    ED Results / Procedures / Treatments   Labs (all labs ordered are listed, but only abnormal results are displayed) Labs  Reviewed  RESP PANEL BY RT-PCR (FLU A&B, COVID) ARPGX2  GROUP A STREP BY PCR    EKG None  Radiology No results found.  Procedures Procedures    Medications Ordered in ED Medications  ondansetron (ZOFRAN-ODT) disintegrating tablet 8 mg (8 mg Oral Given 08/30/21 1349)  acetaminophen (TYLENOL) tablet 500 mg (500 mg Oral Given 08/30/21 1349)  sodium chloride 0.9 % bolus 1,000 mL (1,000 mLs Intravenous New Bag/Given 08/30/21 1417)  prochlorperazine (COMPAZINE) injection 5 mg (5 mg Intravenous Given 08/30/21 1418)    ED Course/ Medical Decision Making/ A&P                           Medical Decision Making Risk OTC drugs. Prescription drug management.   Patient presents with nausea vomiting diarrhea.  Headache.  Sore throat.  Has a history of previous headaches like this.  Posterior pharyngeal erythema without exudate.  Patient continued to vomit after oral Zofran been given.  Fluid bolus and Compazine been given.  Had negative for COVID and flu testing.  Negative pregnancy test at home Care turned over to Dr Roslynn Amble        Final Clinical Impression(s) / ED Diagnoses Final diagnoses:  Nausea and vomiting, unspecified vomiting type  Viral syndrome    Rx / DC Orders ED Discharge Orders          Ordered    ondansetron (ZOFRAN ODT) 4 MG disintegrating tablet  Every 8 hours PRN        08/30/21 1511              Davonna Belling, MD 08/30/21 1512

## 2021-08-30 NOTE — ED Provider Triage Note (Signed)
Emergency Medicine Provider Triage Evaluation Note  DAJIAH KOOI , a 21 y.o. female  was evaluated in triage.  Pt complains of general malaise, myalgias, nausea, and vomiting that started several days ago.  Sore throat started today.  Also complaining of a headache which is chronic for the patient. No fever or chills.   Review of Systems  Positive: See above  Negative:   Physical Exam  BP 120/75 (BP Location: Right Arm)    Pulse 87    Temp 98.9 F (37.2 C) (Oral)    Resp 16    SpO2 100%  Gen:   Awake, no distress   Resp:  Normal effort, clear to auscultation bilaterally MSK:   Moves extremities without difficulty  Other:  Tonsillar erythema without any exudate.  Posterior pharynx is clear.  Talking complete sentences.  No signs of PTA.  Medical Decision Making  Medically screening exam initiated at 12:21 PM.  Appropriate orders placed.  HARLEI LEHRMANN was informed that the remainder of the evaluation will be completed by another provider, this initial triage assessment does not replace that evaluation, and the importance of remaining in the ED until their evaluation is complete.     Honor Loh [place], [place] 08/30/21 1223

## 2021-10-03 ENCOUNTER — Telehealth: Payer: Self-pay | Admitting: Family Medicine

## 2021-10-03 ENCOUNTER — Inpatient Hospital Stay (HOSPITAL_COMMUNITY)
Admission: EM | Admit: 2021-10-03 | Discharge: 2021-10-03 | Disposition: A | Discharge status: Home / Self Care | Payer: Medicaid Other | Attending: Obstetrics and Gynecology | Admitting: Obstetrics and Gynecology

## 2021-10-03 ENCOUNTER — Other Ambulatory Visit: Payer: Self-pay

## 2021-10-03 ENCOUNTER — Encounter (HOSPITAL_COMMUNITY): Payer: Self-pay | Admitting: Obstetrics and Gynecology

## 2021-10-03 ENCOUNTER — Inpatient Hospital Stay (HOSPITAL_COMMUNITY): Payer: Medicaid Other

## 2021-10-03 DIAGNOSIS — R102 Pelvic and perineal pain: Secondary | ICD-10-CM | POA: Diagnosis not present

## 2021-10-03 DIAGNOSIS — O209 Hemorrhage in early pregnancy, unspecified: Secondary | ICD-10-CM

## 2021-10-03 DIAGNOSIS — Z8616 Personal history of COVID-19: Secondary | ICD-10-CM | POA: Insufficient documentation

## 2021-10-03 DIAGNOSIS — O26851 Spotting complicating pregnancy, first trimester: Secondary | ICD-10-CM | POA: Insufficient documentation

## 2021-10-03 DIAGNOSIS — O26891 Other specified pregnancy related conditions, first trimester: Secondary | ICD-10-CM | POA: Insufficient documentation

## 2021-10-03 DIAGNOSIS — Z3A01 Less than 8 weeks gestation of pregnancy: Secondary | ICD-10-CM

## 2021-10-03 DIAGNOSIS — O3680X0 Pregnancy with inconclusive fetal viability, not applicable or unspecified: Secondary | ICD-10-CM | POA: Diagnosis not present

## 2021-10-03 LAB — WET PREP, GENITAL
Clue Cells Wet Prep HPF POC: NONE SEEN
Sperm: NONE SEEN
Trich, Wet Prep: NONE SEEN
WBC, Wet Prep HPF POC: <10 (ref <10)
Yeast Wet Prep HPF POC: NONE SEEN

## 2021-10-03 LAB — CBC
HCT: 32.7 % — ABNORMAL LOW (ref 36.0–46.0)
Hemoglobin: 10.5 g/dL — ABNORMAL LOW (ref 12.0–15.0)
MCH: 25.1 pg — ABNORMAL LOW (ref 26.0–34.0)
MCHC: 32.1 g/dL (ref 30.0–36.0)
MCV: 78 fL — ABNORMAL LOW (ref 80.0–100.0)
Platelets: 268 10*3/uL (ref 150–400)
RBC: 4.19 MIL/uL (ref 3.87–5.11)
RDW: 15.2 % (ref 11.5–15.5)
WBC: 6.9 10*3/uL (ref 4.0–10.5)
nRBC: 0 % (ref 0.0–0.2)

## 2021-10-03 LAB — COMPREHENSIVE METABOLIC PANEL
ALT: 15 U/L (ref 0–44)
AST: 22 U/L (ref 15–41)
Albumin: 3.5 g/dL (ref 3.5–5.0)
Alkaline Phosphatase: 60 U/L (ref 38–126)
Anion gap: 6 (ref 5–15)
BUN: 12 mg/dL (ref 6–20)
CO2: 24 mmol/L (ref 22–32)
Calcium: 9 mg/dL (ref 8.9–10.3)
Chloride: 108 mmol/L (ref 98–111)
Creatinine, Ser: 0.86 mg/dL (ref 0.44–1.00)
GFR, Estimated: >60 mL/min (ref >60)
Glucose, Bld: 93 mg/dL (ref 70–99)
Potassium: 4 mmol/L (ref 3.5–5.1)
Sodium: 138 mmol/L (ref 135–145)
Total Bilirubin: 0.1 mg/dL — ABNORMAL LOW (ref 0.3–1.2)
Total Protein: 6.1 g/dL — ABNORMAL LOW (ref 6.5–8.1)

## 2021-10-03 LAB — HIV ANTIBODY (ROUTINE TESTING W REFLEX): HIV Screen 4th Generation wRfx: NONREACTIVE

## 2021-10-03 LAB — I-STAT BETA HCG BLOOD, ED (MC, WL, AP ONLY): I-stat hCG, quantitative: 1090.2 m[IU]/mL — ABNORMAL HIGH (ref <5)

## 2021-10-03 MED ORDER — METRONIDAZOLE 500 MG PO TABS: 500.0000 mg | ORAL_TABLET | Freq: Two times a day (BID) | ORAL | 0 refills | Status: DC

## 2021-10-03 NOTE — ED Triage Notes (Signed)
Pt reported to ED with c/o lower abdominal cramping and headache x1 week. Pt states she has recently discontinued depo shot and had positive home pregnancy test. LMP 09/02/2021. G2P1 ?

## 2021-10-03 NOTE — MAU Provider Note (Signed)
Chief Complaint: Abdominal Pain ? ? Event Date/Time  ? First Provider Initiated Contact with Patient 10/03/21 0253   ?  ?   ?SUBJECTIVE ?HPI: Heidi Martin is a 21 y.o. G3P1011 at 5382w3d by LMP who presents to maternity admissions reporting pelvic cramping for a week and some light spotting yesterday.  Stopped DepoProvera recently.   ?She denies vaginal itching/burning, urinary symptoms, h/a, dizziness, n/v, or fever/chills.   ? ?Abdominal Pain ?This is a new problem. The current episode started in the past 7 days. The onset quality is gradual. The problem occurs intermittently. The problem is unchanged. The pain is located in the LLQ, RLQ and suprapubic region. The quality of the pain is described as cramping. Pertinent negatives include no constipation, diarrhea, dysuria, fever or frequency. Nothing relieves the symptoms. Past treatments include nothing.  ?Vaginal Bleeding ?The patient's primary symptoms include pelvic pain and vaginal bleeding. The patient's pertinent negatives include no genital itching, genital lesions or genital odor. This is a new problem. The current episode started yesterday. The problem has been resolved. The pain is mild. She is pregnant. Associated symptoms include abdominal pain. Pertinent negatives include no constipation, diarrhea, dysuria, fever or frequency. The vaginal discharge was bloody. The vaginal bleeding is spotting. She has not been passing clots. She has not been passing tissue. Nothing aggravates the symptoms. She has tried nothing for the symptoms.  ? ?RN Note: ?[place] x 1 week. Pt reports vaginal bleeding on tissue after urination but does not go on to pad. Reports  ? ?Past Medical History:  ?Diagnosis Date  ? COVID-19   ? Irritable bowel syndrome (IBS)   ? Sickle cell trait (HCC)   ? ?Past Surgical History:  ?Procedure Laterality Date  ? INDUCED ABORTION    ? NO PAST SURGERIES    ? ?Social History  ? ?Socioeconomic History   ? Marital status: Single  ?  Spouse name: Not on file  ? Number of children: Not on file  ? Years of education: Not on file  ? Highest education level: Not on file  ?Occupational History  ? Not on file  ?Tobacco Use  ? Smoking status: Never  ? Smokeless tobacco: Never  ?Vaping Use  ? Vaping Use: Never used  ?Substance and Sexual Activity  ? Alcohol use: No  ? Drug use: No  ? Sexual activity: Not Currently  ?  Birth control/protection: None  ?Other Topics Concern  ? Not on file  ?Social History Narrative  ? Not on file  ? ?Social Determinants of Health  ? ?Financial Resource Strain: Not on file  ?Food Insecurity: No Food Insecurity  ? Worried About Programme researcher, broadcasting/film/videounning Out of Food in the Last Year: Never true  ? Ran Out of Food in the Last Year: Never true  ?Transportation Needs: No Transportation Needs  ? Lack of Transportation (Medical): No  ? Lack of Transportation (Non-Medical): No  ?Physical Activity: Not on file  ?Stress: Not on file  ?Social Connections: Not on file  ?Intimate Partner Violence: Not on file  ? ?No current facility-administered medications on file prior to encounter.  ? ?Current Outpatient Medications on File Prior to Encounter  ?Medication Sig Dispense Refill  ? acetaminophen (TYLENOL) 500 MG tablet Take 500 mg by mouth every 6 (six) hours as needed for mild pain.    ? butalbital-acetaminophen-caffeine (FIORICET) 50-325-40 MG tablet Take 1 tablet by mouth every 6 (six) hours as needed for headache. 20 tablet  0  ? lidocaine (LIDODERM) 5 % Place 1 patch onto the skin daily as needed for up to 15 doses. Remove & Discard patch within 12 hours or as directed by MD 15 patch 0  ? medroxyPROGESTERone (DEPO-PROVERA) 150 MG/ML injection Inject 1 mL (150 mg total) into the muscle every 3 (three) months. (Patient not taking: Reported on 07/21/2021) 1 mL 3  ? medroxyPROGESTERone (PROVERA) 10 MG tablet Take 10 mg by mouth daily. Until bleeding stops    ? nitrofurantoin, macrocrystal-monohydrate, (MACROBID) 100 MG capsule  Take 1 capsule (100 mg total) by mouth 2 (two) times daily. (Patient not taking: Reported on 07/21/2021) 10 capsule 0  ? ondansetron (ZOFRAN ODT) 4 MG disintegrating tablet Take 1 tablet (4 mg total) by mouth every 8 (eight) hours as needed for nausea or vomiting. 12 tablet 0  ? Prenatal Vit-Fe Fumarate-FA (PRENATAL MULTIVITAMIN) TABS tablet Take 1 tablet by mouth daily at 12 noon.    ? [DISCONTINUED] dicyclomine (BENTYL) 20 MG tablet Take 1 tablet (20 mg total) by mouth 2 (two) times daily. (Patient not taking: No sig reported) 20 tablet 0  ? [DISCONTINUED] metoCLOPramide (REGLAN) 10 MG tablet Take 1 tablet (10 mg total) by mouth 3 (three) times daily between meals as needed for nausea. (Patient not taking: Reported on 02/14/2020) 30 tablet 0  ? [DISCONTINUED] promethazine (PHENERGAN) 12.5 MG tablet Take 1-2 tablets (12.5-25 mg total) by mouth at bedtime. (Patient not taking: Reported on 02/14/2020) 30 tablet 0  ? ?Allergies  ?Allergen Reactions  ? Milk [Lac Bovis] Nausea And Vomiting  ?  Stomach cramping  ? ? ?I have reviewed patient's Past Medical Hx, Surgical Hx, Family Hx, Social Hx, medications and allergies.  ? ?ROS:  ?Review of Systems  ?Constitutional:  Negative for fever.  ?Gastrointestinal:  Positive for abdominal pain. Negative for constipation and diarrhea.  ?Genitourinary:  Positive for pelvic pain and vaginal bleeding. Negative for dysuria and frequency.  ?Review of Systems  ?Other systems negative ? ? ?Physical Exam  ?Physical Exam ?Patient Vitals for the past 24 hrs: ? BP Temp Temp src Pulse Resp SpO2  ?10/03/21 0129 112/73 97.9 ?F (36.6 ?C) Oral 78 16 100 %  ? ?Constitutional: Well-developed, well-nourished female in no acute distress.  ?Cardiovascular: normal rate ?Respiratory: normal effort ?GI: Abd soft, non-tender.  ?MS: Extremities nontender, no edema, normal ROM ?Neurologic: Alert and oriented x 4.  ?GU: Neg CVAT. ? ?PELVIC EXAM: No bleeding visible.  ? ?LAB RESULTS ?Results for orders placed or  performed during the hospital encounter of 10/03/21 (from the past 24 hour(s))  ?I-Stat beta hCG blood, ED     Status: Abnormal  ? Collection Time: 10/03/21  2:05 AM  ?Result Value Ref Range  ? I-stat hCG, quantitative 1,090.2 (H) <5 mIU/mL  ? Comment 3          ? ?Results for orders placed or performed during the hospital encounter of 10/03/21 (from the past 24 hour(s))  ?I-Stat beta hCG blood, ED     Status: Abnormal  ? Collection Time: 10/03/21  2:05 AM  ?Result Value Ref Range  ? I-stat hCG, quantitative 1,090.2 (H) <5 mIU/mL  ? Comment 3          ?CBC     Status: Abnormal  ? Collection Time: 10/03/21  3:19 AM  ?Result Value Ref Range  ? WBC 6.9 4.0 - 10.5 K/uL  ? RBC 4.19 3.87 - 5.11 MIL/uL  ? Hemoglobin 10.5 (L) 12.0 - 15.0 g/dL  ?  HCT 32.7 (L) 36.0 - 46.0 %  ? MCV 78.0 (L) 80.0 - 100.0 fL  ? MCH 25.1 (L) 26.0 - 34.0 pg  ? MCHC 32.1 30.0 - 36.0 g/dL  ? RDW 15.2 11.5 - 15.5 %  ? Platelets 268 150 - 400 K/uL  ? nRBC 0.0 0.0 - 0.2 %  ?Comprehensive metabolic panel     Status: Abnormal  ? Collection Time: 10/03/21  3:19 AM  ?Result Value Ref Range  ? Sodium 138 135 - 145 mmol/L  ? Potassium 4.0 3.5 - 5.1 mmol/L  ? Chloride 108 98 - 111 mmol/L  ? CO2 24 22 - 32 mmol/L  ? Glucose, Bld 93 70 - 99 mg/dL  ? BUN 12 6 - 20 mg/dL  ? Creatinine, Ser 0.86 0.44 - 1.00 mg/dL  ? Calcium 9.0 8.9 - 10.3 mg/dL  ? Total Protein 6.1 (L) 6.5 - 8.1 g/dL  ? Albumin 3.5 3.5 - 5.0 g/dL  ? AST 22 15 - 41 U/L  ? ALT 15 0 - 44 U/L  ? Alkaline Phosphatase 60 38 - 126 U/L  ? Total Bilirubin 0.1 (L) 0.3 - 1.2 mg/dL  ? GFR, Estimated >60 >60 mL/min  ? Anion gap 6 5 - 15  ?Wet prep, genital     Status: None  ? Collection Time: 10/03/21  3:43 AM  ? Specimen: Vaginal  ?Result Value Ref Range  ? Yeast Wet Prep HPF POC NONE SEEN NONE SEEN  ? Trich, Wet Prep NONE SEEN NONE SEEN  ? Clue Cells Wet Prep HPF POC NONE SEEN NONE SEEN  ? WBC, Wet Prep HPF POC <10 <10  ? Sperm NONE SEEN   ? ?--/--/O POS (05/31 0045) ? ?IMAGING ?US OB LESS THAN 14 WEEKS WITH  OB TRANSVAGINAL ? ?Result Date: 10/03/2021 ?CLINICAL DATA:  Pelvic pain for 1 week. EXAM: OBSTETRIC <14 WK [place] AND TRANSVAGINAL OB US TECHNIQUE: Both transabdominal and transvaginal ultrasound examin

## 2021-10-03 NOTE — Telephone Encounter (Signed)
Called patient to tell her of appointment time, there was no answer to the phone call so a voicemail was left with the call back number for the office. ?

## 2021-10-03 NOTE — MAU Note (Signed)
Pt arrived in [place] with complaints lower abdominal pain x 1 week. Pt reports vaginal bleeding on tissue after urination but does not go on to pad. Reports  ?

## 2021-10-05 ENCOUNTER — Telehealth: Payer: Self-pay

## 2021-10-05 ENCOUNTER — Ambulatory Visit: Payer: Medicaid Other

## 2021-10-05 ENCOUNTER — Telehealth: Payer: Self-pay | Admitting: Family Medicine

## 2021-10-05 ENCOUNTER — Ambulatory Visit (INDEPENDENT_AMBULATORY_CARE_PROVIDER_SITE_OTHER): Payer: Medicaid Other

## 2021-10-05 VITALS — BP 110/71 | HR 72 | Temp 98.5°F | Wt 170.5 lb

## 2021-10-05 DIAGNOSIS — F319 Bipolar disorder, unspecified: Secondary | ICD-10-CM

## 2021-10-05 DIAGNOSIS — O3680X0 Pregnancy with inconclusive fetal viability, not applicable or unspecified: Secondary | ICD-10-CM

## 2021-10-05 DIAGNOSIS — F909 Attention-deficit hyperactivity disorder, unspecified type: Secondary | ICD-10-CM

## 2021-10-05 DIAGNOSIS — R110 Nausea: Secondary | ICD-10-CM

## 2021-10-05 DIAGNOSIS — A749 Chlamydial infection, unspecified: Secondary | ICD-10-CM

## 2021-10-05 LAB — GC/CHLAMYDIA PROBE AMP (~~LOC~~) NOT AT ARMC
Chlamydia: POSITIVE — AB
Comment: NEGATIVE
Comment: NORMAL
Neisseria Gonorrhea: NEGATIVE

## 2021-10-05 LAB — BETA HCG QUANT (REF LAB): hCG Quant: 3122 m[IU]/mL

## 2021-10-05 MED ORDER — AZITHROMYCIN 500 MG PO TABS: 1000.0000 mg | ORAL_TABLET | Freq: Every day | ORAL | 0 refills | Status: DC

## 2021-10-05 MED ORDER — PROMETHAZINE HCL 25 MG PO TABS: 25.0000 mg | ORAL_TABLET | Freq: Four times a day (QID) | ORAL | 0 refills | Status: DC | PRN

## 2021-10-05 NOTE — Telephone Encounter (Signed)
Pt's concerns were addressed in another encounter.  

## 2021-10-05 NOTE — Telephone Encounter (Signed)
Called patient to follow up on missed appt for repeat beta HCG. VM left stating I am calling regarding missed appt and requesting pt to call back to reschedule. MyChart message sent.  ?

## 2021-10-05 NOTE — Telephone Encounter (Signed)
Patient called wanting to have a nurse call her back to go over her results that she received over mychart today. ?

## 2021-10-05 NOTE — Progress Notes (Signed)
Beta HCG Follow-up Visit ? ?Heidi Martin presents to [facility] for follow-up beta HCG lab. She was seen in MAU for abdominal pain and vaginal bleeding on 10/05/21. Patient denies pain or bleeding today. Discussed with patient that we are following beta HCG levels today. Valid contact number for patient confirmed. I will call the patient with results. Pt reports this is unplanned pregnancy. Pt has not decided if this pregnancy is desired. PHQ-9 and GAD-7 positive today; denies SI or thoughts of harm. Pt reports history of ADHD and Bipolar Depression. Not currently being followed for mental health concerns. Has taken medication in the past but not currently. Desires appt with [facility] Heidi Martin. Referral placed. ? ?HCG results: ?10/03/21 @ 0319 1090.2  ?10/05/21 @ 1052 3122  ? ?Results and patient history reviewed with Edd Arbour, CNM, who states this is appropriate rise in beta HCG. Pt should be scheduled for [place] in 10-14 days. Patient called with results. Offered [place] appt 10/18/21, pt would like to schedule this today. Encouraged pt to follow up with any concerns before [place] appt. Return to MAU precautions reviewed. Pt reports she received result of positive Chlamydia test on 10/03/21. Zithromax 1 g once sent per protocol. Explained any recent partners should be notified and may receive treatment at PCP or health department. Test of cure vaginal swab visit scheduled for 10/28/21. ? ?[name] ?10/05/2021 11:13 AM  ? ?

## 2021-10-06 NOTE — BH Specialist Note (Signed)
Pt did not arrive to video visit and did not answer the phone; Left HIPPA-compliant message to call back Ameliah Baskins from [facility] at  [facility] at  [phone] (Lauramae Kneisley's office).  ?; left MyChart message for patient.  ? ?

## 2021-10-18 ENCOUNTER — Ambulatory Visit (INDEPENDENT_AMBULATORY_CARE_PROVIDER_SITE_OTHER): Payer: Medicaid Other

## 2021-10-18 ENCOUNTER — Ambulatory Visit (INDEPENDENT_AMBULATORY_CARE_PROVIDER_SITE_OTHER): Payer: Medicaid Other | Admitting: General Practice

## 2021-10-18 DIAGNOSIS — A749 Chlamydial infection, unspecified: Secondary | ICD-10-CM

## 2021-10-18 DIAGNOSIS — Z712 Person consulting for explanation of examination or test findings: Secondary | ICD-10-CM

## 2021-10-18 DIAGNOSIS — O3680X0 Pregnancy with inconclusive fetal viability, not applicable or unspecified: Secondary | ICD-10-CM

## 2021-10-18 NOTE — Progress Notes (Signed)
Patient presents to office today for viability ultrasound results. She reports continued spotting with cramping that is severe at times. Reviewed ultrasound images with Dr Donavan Foil who finds progression of pregnancy since last scan on 4/3. Ultrasound now reveals larger gestational sac with yolk sac but no fetal pole, threatened SAB versus normal pregnancy. Patient should have follow up ultrasound in 2 weeks per Dr Donavan Foil. Scheduled for 4/25. Reviewed results with patient and discussed ultrasound appt. Advised patient if bleeding becomes heavy like a period or pain becomes constant/severe to go to MAU for evaluation. Patient verbalized understanding. ? ?Chase Caller RN BSN ?10/18/21 ? ?

## 2021-10-19 ENCOUNTER — Ambulatory Visit: Payer: Medicaid Other | Admitting: Clinical

## 2021-10-19 DIAGNOSIS — Z91199 Patient's noncompliance with other medical treatment and regimen due to unspecified reason: Secondary | ICD-10-CM

## 2021-10-25 ENCOUNTER — Ambulatory Visit (INDEPENDENT_AMBULATORY_CARE_PROVIDER_SITE_OTHER): Payer: Medicaid Other | Admitting: Family Medicine

## 2021-10-25 ENCOUNTER — Ambulatory Visit (INDEPENDENT_AMBULATORY_CARE_PROVIDER_SITE_OTHER): Payer: Medicaid Other

## 2021-10-25 ENCOUNTER — Encounter: Payer: Self-pay | Admitting: Family Medicine

## 2021-10-25 DIAGNOSIS — O3680X0 Pregnancy with inconclusive fetal viability, not applicable or unspecified: Secondary | ICD-10-CM

## 2021-10-25 DIAGNOSIS — O0289 Other abnormal products of conception: Secondary | ICD-10-CM | POA: Diagnosis not present

## 2021-10-25 NOTE — Progress Notes (Signed)
? ?GYNECOLOGY OFFICE VISIT NOTE ? ?History:  ? Heidi Martin is a 21 y.o. [phone] here today for [place] results. ? ?Patient seen in MAU on 10/03/2021, at that time had GS but no other findings ?Has repeat US on 10/18/2021 that showede IUGS with yolk sac but no fetal pole ?Today's US shows IUGS with yolk sac, another cystic structure not totally c/w fetal pole ?Overall findings c/w failed pregnancy ?Patient not interested in birth control ?Not bleeding or having pain at present ? ?Health Maintenance Due  ?Topic Date Due  ? HPV VACCINES (1 - 2-dose series) Never done  ? COVID-19 Vaccine (3 - Moderna risk series) 05/12/2020  ? ? ?Past Medical History:  ?Diagnosis Date  ? COVID-19   ? Irritable bowel syndrome (IBS)   ? Sickle cell trait ([place])   ? ? ?Past Surgical History:  ?Procedure Laterality Date  ? INDUCED ABORTION    ? NO PAST SURGERIES    ? ? ?The following portions of the patient's history were reviewed and updated as appropriate: allergies, current medications, past family history, past medical history, past social history, past surgical history and problem list.  ? ?Health Maintenance:   ?Last pap: ?No results found for: DIAGPAP, HPV, HPVHIGH ?N/a ? ?Last mammogram:  ?N/a  ? ? ?Review of Systems:  ?Pertinent items noted in HPI and remainder of comprehensive ROS otherwise negative. ? ?Physical Exam:  ?LMP 09/02/2021  ?CONSTITUTIONAL: Well-developed, well-nourished female in no acute distress.  ?HEENT:  Normocephalic, atraumatic. External right and left ear normal. No scleral icterus.  ?NECK: Normal range of motion, supple, no masses noted on observation ?SKIN: No rash noted. Not diaphoretic. No erythema. No pallor. ?MUSCULOSKELETAL: Normal range of motion. No edema noted. ?NEUROLOGIC: Alert and oriented to person, place, and time. Normal muscle tone coordination.  ?PSYCHIATRIC: Normal mood and affect. Normal behavior. Normal judgment and thought content. ?RESPIRATORY: Effort normal, no problems with respiration  noted ? ?Labs and Imaging ?No results found for this or any previous visit (from the past 168 hour(s)). ?US OB Transvaginal ? ?Result Date: 10/24/2021 ?----------------------------------------------------------------------  OBSTETRICS REPORT                       (Signed Final 10/24/2021 12:20 pm) ---------------------------------------------------------------------- Patient Info  ID #:       [id number]                          D.O.B.:  [birth year] (20 yrs)  Name:       Heidi Martin                  Visit Date: 10/18/2021 12:26 pm ---------------------------------------------------------------------- Performed By  Attending:        Darron Doom MD         Ref. Address:     [address]                                                             [place], [place]  [id number]  Performed By:     Johnsie Kindred,         Location:         [facility]                    [place] [facility]                                                             [place] [facility]                                                             [facility]  Referred By:      [facility] ---------------------------------------------------------------------- Orders  #  Description                           Code        Ordered By  1  US OB TRANSVAGINAL                    [phone]     Gaylan Gerold ----------------------------------------------------------------------  #  Order #                     Accession #                Episode #  1  [id number]                   [id number]                 [id number] ---------------------------------------------------------------------- Indications  Pregnancy with inconclusive fetal viability    O36.80X0 ---------------------------------------------------------------------- Fetal Evaluation  Num Of Fetuses:         1  Preg. Location:          Intrauterine  Gest. Sac:              Intrauterine  Yolk Sac:               Visualized  Fetal Pole:             Not visualized  Cardiac Activity:       Not visualized ---------------------------------------------------------------------- Biometry  GS:       22.1  mm     G. Age:  7w 3d  EDD:   06/03/22 ---------------------------------------------------------------------- Cervix Uterus Adnexa  Cervix  Closed  Uterus  Retroverted.  Right Ovary  Within normal limits.  Left Ovary  Within normal limits. ---------------------------------------------------------------------- Impression  Single IUGS with yolk sac, no fetal pole  Normal adnexa ---------------------------------------------------------------------- Recommendations  Pregnancy of unknown viability--Previous u/s showed smaller  sac without yolk sac  F/u in 2 weeks to see progression ----------------------------------------------------------------------                  Darron Doom, MD Electronically Signed Final Report   10/24/2021 12:20 pm ---------------------------------------------------------------------- ? ?US OB LESS THAN 14 WEEKS WITH OB TRANSVAGINAL ? ?Result Date: 10/03/2021 ?CLINICAL DATA:  Pelvic pain for 1 week. EXAM: OBSTETRIC <14 WK [place] AND TRANSVAGINAL OB US TECHNIQUE: Both transabdominal and transvaginal ultrasound examinations were performed for complete evaluation of the gestation as well as the maternal uterus, adnexal regions, and pelvic cul-de-sac. Transvaginal technique was performed to assess early pregnancy. COMPARISON:  11/24/2020. FINDINGS: Intrauterine gestational sac: Single Yolk sac:  No Embryo:  No Cardiac Activity: No Heart Rate: None. MSD: 2.6 mm   4 w   6 d Subchorionic hemorrhage:  None visualized. Maternal uterus/adnexae: A corpus luteal cyst is noted in the right ovary. The left ovary is within normal limits. A small amount of free fluid is present in the pelvis. IMPRESSION: Single intrauterine gestational sac with  estimated gestational age of [redacted] weeks 6 days. No yolk sac, fetal pole, or cardiac activity is seen at this time which is likely related to early pregnancy. Correlation with beta HCG and short-term follow-up ultrasound is recommended. Electronically Signed   By: Brett Fairy M.D.   On: 10/03/2021 04:15      ?Assessment and Plan:  ? ?Problem List Items Addressed This Visit   ? ?  ? Other  ? Nonviable pregnancy  ?  Reviewed [place] images over past month with Dr. Ilda Basset who is in agreement with diagnosis of nonviable pregnancy. Reassured patient that miscarriage is common. Reassured patient that there is nothing she did or did not do to cause this. Reviewed most common reason is presumed to be genetic abnormalities that allow a pregnancy to start but not continue past an early stage, but realistically we do not know the cause in most cases. Reviewed options of expectant, medical, or surgical management. After counseling she elected for surgical management. Message sent to scheduler. Advised patient to expect phone call in the next few days.  ? ?  ?  ? ? ?Routine preventative health maintenance measures emphasized. ?Please refer to After Visit Summary for other counseling recommendations.  ? ?Return in about 4 weeks (around 11/22/2021) for follow up SAB.   ? ?Total face-to-face time with patient: 20 minutes.  Over 50% of encounter was spent on counseling and coordination of care. ? ? ?Clarnce Flock, MD/MPH ?Attending Family Medicine Physician, Faculty Practice ?[facility] [place] ?

## 2021-10-25 NOTE — Assessment & Plan Note (Addendum)
Reviewed [place] images over past month with Dr. Ilda Basset who is in agreement with diagnosis of nonviable pregnancy. Reassured patient that miscarriage is common. Reassured patient that there is nothing she did or did not do to cause this. Reviewed most common reason is presumed to be genetic abnormalities that allow a pregnancy to start but not continue past an early stage, but realistically we do not know the cause in most cases. Reviewed options of expectant, medical, or surgical management. After counseling she elected for surgical management. Message sent to scheduler. Advised patient to expect phone call in the next few days.  ?

## 2021-10-25 NOTE — Progress Notes (Unsigned)
***  non viable ?

## 2021-10-26 ENCOUNTER — Other Ambulatory Visit: Payer: Self-pay

## 2021-10-26 ENCOUNTER — Encounter (HOSPITAL_COMMUNITY): Payer: Self-pay | Admitting: Obstetrics and Gynecology

## 2021-10-26 ENCOUNTER — Other Ambulatory Visit: Payer: Self-pay | Admitting: Obstetrics and Gynecology

## 2021-10-26 ENCOUNTER — Telehealth: Payer: Self-pay

## 2021-10-26 NOTE — Progress Notes (Signed)
? ?  Anesthesia review: no ? ?------------- ? ?SDW INSTRUCTIONS: ? ?Your procedure is scheduled on Thursday 4/27. Please report to [facility] Main Entrance "A" at 0900 A.M., and check in at the Admitting office. Call this number if you have problems the morning of surgery: [phone] ? ? ?Remember: Do not eat after midnight the night before your surgery ? ?You may drink clear liquids until 0830 the morning of your surgery.   ?Clear liquids allowed are: Water, Non-Citrus Juices (without pulp), Carbonated Beverages, Clear Tea, Black Coffee Only, and Gatorade ?  ?Medications to take morning of surgery with a sip of water include: ?none ? ?As of today, STOP taking any Aspirin (unless otherwise instructed by your surgeon), Aleve, Naproxen, Ibuprofen, Motrin, Advil, Goody's, BC's, all herbal medications, fish oil, and all vitamins. ? ?  ?The Morning of Surgery ?Do not wear jewelry, make-up or nail polish. ?Do not wear lotions, powders, or perfumes/colognes, or deodorant ?Do not bring valuables to the hospital. ?[place] is not responsible for any belongings or valuables. ? ?If you are a smoker, DO NOT Smoke 24 hours prior to surgery ? ?If you wear a CPAP at night please bring your mask the morning of surgery  ? ?Remember that you must have someone to transport you home after your surgery, and remain with you for 24 hours if you are discharged the same day. ? ?Please bring cases for contacts, glasses, hearing aids, dentures or bridgework because it cannot be worn into surgery.  ? ?Patients discharged the day of surgery will not be allowed to drive home.  ? ?Please shower the NIGHT BEFORE/MORNING OF SURGERY (use antibacterial soap like DIAL soap if possible). Wear comfortable clothes the morning of surgery. Oral Hygiene is also important to reduce your risk of infection.  Remember - BRUSH YOUR TEETH THE MORNING OF SURGERY WITH YOUR REGULAR TOOTHPASTE ? ?Patient denies shortness of breath, fever, cough and chest pain.   ? ? ?   ? ?

## 2021-10-26 NOTE — Telephone Encounter (Signed)
Called patient, no answer, left voicemail with surgery date, time and preop instructions. ?

## 2021-10-26 NOTE — Telephone Encounter (Signed)
-----   Message from [name], MD sent at 10/25/2021  1:06 PM EDT ----- ?Regarding: Schedule D&C for nonviable pregnancy ?Thanks! ?Heidi Martin ? ?

## 2021-10-27 ENCOUNTER — Encounter: Payer: Self-pay | Admitting: Obstetrics and Gynecology

## 2021-10-27 ENCOUNTER — Telehealth: Payer: Self-pay | Admitting: Obstetrics and Gynecology

## 2021-10-27 ENCOUNTER — Encounter (HOSPITAL_COMMUNITY)
Admission: AC | Disposition: A | Discharge status: Home / Self Care | Payer: Self-pay | Source: Home / Self Care | Attending: Obstetrics and Gynecology

## 2021-10-27 ENCOUNTER — Ambulatory Visit (HOSPITAL_COMMUNITY): Payer: Medicaid Other | Admitting: Anesthesiology

## 2021-10-27 ENCOUNTER — Ambulatory Visit (HOSPITAL_COMMUNITY)
Admission: AC | Admit: 2021-10-27 | Discharge: 2021-10-27 | Disposition: A | Discharge status: Home / Self Care | Payer: Medicaid Other | Attending: Obstetrics and Gynecology | Admitting: Obstetrics and Gynecology

## 2021-10-27 ENCOUNTER — Other Ambulatory Visit: Payer: Self-pay

## 2021-10-27 ENCOUNTER — Ambulatory Visit (HOSPITAL_BASED_OUTPATIENT_CLINIC_OR_DEPARTMENT_OTHER): Payer: Medicaid Other | Admitting: Anesthesiology

## 2021-10-27 ENCOUNTER — Encounter (HOSPITAL_COMMUNITY): Payer: Self-pay | Admitting: Obstetrics and Gynecology

## 2021-10-27 DIAGNOSIS — O039 Complete or unspecified spontaneous abortion without complication: Secondary | ICD-10-CM | POA: Diagnosis present

## 2021-10-27 DIAGNOSIS — Z3A00 Weeks of gestation of pregnancy not specified: Secondary | ICD-10-CM | POA: Diagnosis not present

## 2021-10-27 DIAGNOSIS — O021 Missed abortion: Secondary | ICD-10-CM

## 2021-10-27 DIAGNOSIS — D573 Sickle-cell trait: Secondary | ICD-10-CM | POA: Diagnosis not present

## 2021-10-27 HISTORY — DX: Thalassemia minor: D56.3

## 2021-10-27 HISTORY — PX: DILATION AND EVACUATION: SHX1459

## 2021-10-27 LAB — POCT I-STAT, CHEM 8
BUN: <3 mg/dL — ABNORMAL LOW (ref 6–20)
Calcium, Ion: 1.24 mmol/L (ref 1.15–1.40)
Chloride: 98 mmol/L (ref 98–111)
Creatinine, Ser: 0.7 mg/dL (ref 0.44–1.00)
Glucose, Bld: 131 mg/dL — ABNORMAL HIGH (ref 70–99)
HCT: 30 % — ABNORMAL LOW (ref 36.0–46.0)
Hemoglobin: 10.2 g/dL — ABNORMAL LOW (ref 12.0–15.0)
Potassium: 3.4 mmol/L — ABNORMAL LOW (ref 3.5–5.1)
Sodium: 133 mmol/L — ABNORMAL LOW (ref 135–145)
TCO2: 22 mmol/L (ref 22–32)

## 2021-10-27 LAB — CBC
HCT: 28.6 % — ABNORMAL LOW (ref 36.0–46.0)
Hemoglobin: 9.4 g/dL — ABNORMAL LOW (ref 12.0–15.0)
MCH: 25.7 pg — ABNORMAL LOW (ref 26.0–34.0)
MCHC: 32.9 g/dL (ref 30.0–36.0)
MCV: 78.1 fL — ABNORMAL LOW (ref 80.0–100.0)
Platelets: 206 10*3/uL (ref 150–400)
RBC: 3.66 MIL/uL — ABNORMAL LOW (ref 3.87–5.11)
RDW: 15.4 % (ref 11.5–15.5)
WBC: 13.3 10*3/uL — ABNORMAL HIGH (ref 4.0–10.5)
nRBC: 0 % (ref 0.0–0.2)

## 2021-10-27 LAB — TYPE AND SCREEN
ABO/RH(D): O POS
Antibody Screen: NEGATIVE

## 2021-10-27 SURGERY — DILATION AND EVACUATION, UTERUS
Anesthesia: General

## 2021-10-27 MED ORDER — MIDAZOLAM HCL 2 MG/2ML IJ SOLN
1.0000 mg | Freq: Once | INTRAMUSCULAR | Status: AC
  Administered 2021-10-27: 1 mg via INTRAVENOUS

## 2021-10-27 MED ORDER — MIDAZOLAM HCL 2 MG/2ML IJ SOLN
INTRAMUSCULAR | Status: DC | PRN
  Administered 2021-10-27 (×2): 1 mg via INTRAVENOUS

## 2021-10-27 MED ORDER — ROCURONIUM 10MG/ML (10ML) SYRINGE FOR MEDFUSION PUMP - OPTIME
INTRAVENOUS | Status: DC | PRN
  Administered 2021-10-27: 50 mg via INTRAVENOUS

## 2021-10-27 MED ORDER — FERROUS SULFATE 325 (65 FE) MG PO TABS: 325.0000 mg | ORAL_TABLET | ORAL | 0 refills | Status: DC

## 2021-10-27 MED ORDER — SODIUM CHLORIDE 0.9 % IV SOLN: INTRAVENOUS | Status: DC

## 2021-10-27 MED ORDER — MIDAZOLAM HCL 2 MG/2ML IJ SOLN
INTRAMUSCULAR | Status: AC
  Filled 2021-10-27: qty 2

## 2021-10-27 MED ORDER — SUGAMMADEX SODIUM 200 MG/2ML IV SOLN
INTRAVENOUS | Status: DC | PRN
  Administered 2021-10-27: 200 mg via INTRAVENOUS

## 2021-10-27 MED ORDER — LIDOCAINE HCL (PF) 1 % IJ SOLN
INTRAMUSCULAR | Status: AC
  Filled 2021-10-27: qty 60

## 2021-10-27 MED ORDER — FENTANYL CITRATE (PF) 250 MCG/5ML IJ SOLN
INTRAMUSCULAR | Status: AC
Start: 2021-10-27 — End: ?
  Filled 2021-10-27: qty 5

## 2021-10-27 MED ORDER — IBUPROFEN 600 MG PO TABS: 600.0000 mg | ORAL_TABLET | Freq: Four times a day (QID) | ORAL | 0 refills | Status: DC | PRN

## 2021-10-27 MED ORDER — PROPOFOL 10 MG/ML IV BOLUS
INTRAVENOUS | Status: DC | PRN
  Administered 2021-10-27: 140 mg via INTRAVENOUS

## 2021-10-27 MED ORDER — LIDOCAINE 2% (20 MG/ML) 5 ML SYRINGE
INTRAMUSCULAR | Status: DC | PRN
  Administered 2021-10-27: 60 mg via INTRAVENOUS

## 2021-10-27 MED ORDER — KETOROLAC TROMETHAMINE 30 MG/ML IJ SOLN
INTRAMUSCULAR | Status: DC | PRN
  Administered 2021-10-27: 30 mg via INTRAVENOUS

## 2021-10-27 MED ORDER — OXYCODONE-ACETAMINOPHEN 5-325 MG PO TABS: 1.0000 | ORAL_TABLET | Freq: Four times a day (QID) | ORAL | 0 refills | Status: DC | PRN

## 2021-10-27 MED ORDER — ONDANSETRON HCL 4 MG/2ML IJ SOLN: 4.0000 mg | Freq: Once | INTRAMUSCULAR | Status: DC | PRN

## 2021-10-27 MED ORDER — DOXYCYCLINE HYCLATE 100 MG IV SOLR
200.0000 mg | Freq: Once | INTRAVENOUS | Status: AC
  Administered 2021-10-27: 200 mg via INTRAVENOUS
  Filled 2021-10-27: qty 200

## 2021-10-27 MED ORDER — ACETAMINOPHEN 500 MG PO TABS: 1000.0000 mg | ORAL_TABLET | Freq: Once | ORAL | Status: AC

## 2021-10-27 MED ORDER — ESMOLOL HCL 100 MG/10ML IV SOLN
INTRAVENOUS | Status: DC | PRN
  Administered 2021-10-27: 20 mg via INTRAVENOUS
  Administered 2021-10-27: 30 mg via INTRAVENOUS

## 2021-10-27 MED ORDER — DOCUSATE SODIUM 100 MG PO CAPS: 100.0000 mg | ORAL_CAPSULE | Freq: Every day | ORAL | 1 refills | Status: DC

## 2021-10-27 MED ORDER — FENTANYL CITRATE (PF) 100 MCG/2ML IJ SOLN
INTRAMUSCULAR | Status: AC
  Filled 2021-10-27: qty 2

## 2021-10-27 MED ORDER — LACTATED RINGERS IV SOLN: INTRAVENOUS | Status: DC

## 2021-10-27 MED ORDER — TRANEXAMIC ACID-NACL 1000-0.7 MG/100ML-% IV SOLN
INTRAVENOUS | Status: AC
  Filled 2021-10-27: qty 100

## 2021-10-27 MED ORDER — ACETAMINOPHEN 500 MG PO TABS
ORAL_TABLET | ORAL | Status: AC
  Administered 2021-10-27: 1000 mg via ORAL
  Filled 2021-10-27: qty 2

## 2021-10-27 MED ORDER — ORAL CARE MOUTH RINSE: 15.0000 mL | Freq: Once | OROMUCOSAL | Status: AC

## 2021-10-27 MED ORDER — PROPOFOL 10 MG/ML IV BOLUS
INTRAVENOUS | Status: AC
  Filled 2021-10-27: qty 20

## 2021-10-27 MED ORDER — DEXAMETHASONE SODIUM PHOSPHATE 10 MG/ML IJ SOLN
INTRAMUSCULAR | Status: DC | PRN
  Administered 2021-10-27: 10 mg via INTRAVENOUS

## 2021-10-27 MED ORDER — CHLORHEXIDINE GLUCONATE 0.12 % MT SOLN
OROMUCOSAL | Status: AC
  Administered 2021-10-27: 15 mL via OROMUCOSAL
  Filled 2021-10-27: qty 15

## 2021-10-27 MED ORDER — FENTANYL CITRATE (PF) 250 MCG/5ML IJ SOLN
INTRAMUSCULAR | Status: DC | PRN
  Administered 2021-10-27 (×2): 50 ug via INTRAVENOUS
  Administered 2021-10-27: 100 ug via INTRAVENOUS
  Administered 2021-10-27: 50 ug via INTRAVENOUS

## 2021-10-27 MED ORDER — LIDOCAINE HCL 1 % IJ SOLN
INTRAMUSCULAR | Status: DC | PRN
  Administered 2021-10-27: 12 mL via INTRADERMAL

## 2021-10-27 MED ORDER — FENTANYL CITRATE (PF) 100 MCG/2ML IJ SOLN
25.0000 ug | INTRAMUSCULAR | Status: DC | PRN
  Administered 2021-10-27: 50 ug via INTRAVENOUS
  Administered 2021-10-27: 25 ug via INTRAVENOUS

## 2021-10-27 MED ORDER — ONDANSETRON HCL 4 MG/2ML IJ SOLN
INTRAMUSCULAR | Status: DC | PRN
  Administered 2021-10-27: 4 mg via INTRAVENOUS

## 2021-10-27 MED ORDER — TRANEXAMIC ACID-NACL 1000-0.7 MG/100ML-% IV SOLN
INTRAVENOUS | Status: DC | PRN
  Administered 2021-10-27: 1000 mg via INTRAVENOUS

## 2021-10-27 MED ORDER — CHLORHEXIDINE GLUCONATE 0.12 % MT SOLN: 15.0000 mL | Freq: Once | OROMUCOSAL | Status: AC

## 2021-10-27 MED ORDER — SILVER NITRATE-POT NITRATE 75-25 % EX MISC
CUTANEOUS | Status: DC | PRN
  Administered 2021-10-27: 3

## 2021-10-27 SURGICAL SUPPLY — 21 items
CATH ROBINSON RED A/P 16FR (CATHETERS) ×1 IMPLANT
FILTER UTR ASPR ASSEMBLY (MISCELLANEOUS) ×2 IMPLANT
GAUZE 4X4 16PLY ~~LOC~~+RFID DBL (SPONGE) ×1 IMPLANT
GLOVE BIOGEL PI IND STRL 7.0 (GLOVE) ×1 IMPLANT
GLOVE BIOGEL PI IND STRL 7.5 (GLOVE) ×1 IMPLANT
GLOVE BIOGEL PI INDICATOR 7.0 (GLOVE) ×1
GLOVE BIOGEL PI INDICATOR 7.5 (GLOVE) ×1
GLOVE NEODERM STER SZ 7 (GLOVE) ×2 IMPLANT
GOWN STRL REUS W/ TWL LRG LVL3 (GOWN DISPOSABLE) ×1 IMPLANT
GOWN STRL REUS W/ TWL XL LVL3 (GOWN DISPOSABLE) ×1 IMPLANT
GOWN STRL REUS W/TWL LRG LVL3 (GOWN DISPOSABLE) ×1
GOWN STRL REUS W/TWL XL LVL3 (GOWN DISPOSABLE) ×1
HOSE CONNECTING 18IN BERKELEY (TUBING) ×2 IMPLANT
KIT BERKELEY 1ST TRI 3/8 NO TR (MISCELLANEOUS) ×2 IMPLANT
KIT BERKELEY 1ST TRIMESTER 3/8 (MISCELLANEOUS) ×2 IMPLANT
NS IRRIG 1000ML POUR BTL (IV SOLUTION) ×2 IMPLANT
PACK VAGINAL MINOR WOMEN LF (CUSTOM PROCEDURE TRAY) ×2 IMPLANT
PAD OB MATERNITY 4.3X12.25 (PERSONAL CARE ITEMS) ×2 IMPLANT
SET BERKELEY SUCTION TUBING (SUCTIONS) ×2 IMPLANT
TOWEL GREEN STERILE FF (TOWEL DISPOSABLE) ×4 IMPLANT
VACURETTE 8 RIGID CVD (CANNULA) ×1 IMPLANT

## 2021-10-27 NOTE — Transfer of Care (Signed)
Immediate Anesthesia Transfer of Care Note ? ?Patient: Heidi Martin ? ?Procedure(s) Performed: DILATATION AND EVACUATION ? ?Patient Location: PACU ? ?Anesthesia Type:General ? ?Level of Consciousness: awake, alert  and oriented ? ?Airway & Oxygen Therapy: Patient Spontanous Breathing and Patient connected to nasal cannula oxygen ? ?Post-op Assessment: Report given to RN, Post -op Vital signs reviewed and stable and complaining chest pain. Dr.Turk notified. ? ?Post vital signs: Reviewed and stable ? ?Last Vitals:  ?Vitals Value Taken Time  ?BP 128/87 10/27/21 1144  ?Temp    ?Pulse 116 10/27/21 1156  ?Resp 19 10/27/21 1156  ?SpO2 100 % 10/27/21 1156  ?Vitals shown include unvalidated device data. ? ?Last Pain:  ?Vitals:  ? 10/27/21 0944  ?PainSc: 9   ?   ? ?  ? ?Complications: No notable events documented. ?

## 2021-10-27 NOTE — Telephone Encounter (Deleted)
See note

## 2021-10-27 NOTE — Addendum Note (Signed)
Addendum  created 10/27/21 1432 by Collene Schlichter, MD  ? SmartForm saved  ?  ?

## 2021-10-27 NOTE — Progress Notes (Signed)
Pt called via nurse line with c/o pain ?S/P D & C today ?Was discharged home on Motrin q 6 hrs and Percocet q 6 . ?Took both @ 6 hrs ago ?Bleeding normal ? ?Pt instructed to take Motrin now and every 6 hrs for the next 48 hrs. ?Take 2 Percocet now and repeat in 4 hours. ? ?Continue with routine discharge instructions and to call back if suggestions did not help or for any other concerns. ? ? ?

## 2021-10-27 NOTE — Op Note (Addendum)
Operative Note  ? ?10/27/2021 ? ?PRE-OP DIAGNOSIS: Non viable intrauterine pregnancy ?  ?POST-OP DIAGNOSIS: Same. ?anterior, lower uterine segment fibroid ? ?SURGEON: Surgeon(s) and Role: ?   * Aletha Halim, MD - Primary ? ?ASSISTANT: None ? ?PROCEDURE:  Suction dilation and curettage ? ?ANESTHESIA: General and paracervical block ? ?ESTIMATED BLOOD LOSS: 829mL ? ?DRAINS: I/O cath done pre procedure ? ?TOTAL IV FLUIDS: per anesthesia note ? ?SPECIMENS: products of conception to pathology ? ?VTE PROPHYLAXIS: SCDs to the bilateral lower extremities ? ?ANTIBIOTICS: Doxycycline 200mg  IV x 1 pre op ? ?COMPLICATIONS: none ? ?DISPOSITION: PACU - hemodynamically stable. ? ?CONDITION: stable ? ?BLOOD TYPE: O POS. Rhogam given:not applicable ? ?FINDINGS: Exam under anesthesia revealed 8 week sized uterus with no masses and bilateral adnexa without masses or fullness. Necrotic appearing products of conception were seen. During the [facility], there was brisk bleeding noted and what felt to be a mass in the anterior part of the lower uterine segment that I was able to curve around and get to the fundus but bleeding continued until I did a manual curettage of that LUS area and I was able to get tissue that looked like products of conception vs ?fibroid ? ?PROCEDURE IN DETAIL:  After informed consent was obtained, the patient was taken to the operating room where anesthesia was obtained without difficulty. The patient was positioned in the dorsal lithotomy position in [place]. The patient was examined under anesthesia, with the above noted findings.  The bi-valved speculum was placed inside the patient's vagina, and the the anterior lip of the cervix was seen and grasped with the tenaculum.  A paracervical block was achieved with 41mL of 1% lidocaine with 10 on the right and only 2 on the left b/c I got +blood on aspiration when re-injecting at a different area. The cervix already passed a #8 cannula. The suction was then  calibrated to 17mmHg and connected to the number 8 cannula, which was then introduced with the above noted findings. Lysteda was given as well as a bimanual massage done. A gentle curettage was done at the end and yield no products of conception.   The suction was then done one more time to remove any remaining curettage material.  ? Excellent hemostasis was noted, and all instruments were removed, with excellent hemostasis noted throughout; silver nitrate was applied to the tenaculum sites prior to removal of speculum. She was then taken out of dorsal lithotomy. ?The patient tolerated the procedure well.  Sponge, lap and instrument counts were correct x2.  The patient was taken to recovery room in excellent condition. ? ?Plan to check CBC in the PACU ? ?Durene Romans MD ?Attending ?[facility] [employer] [facility]) ? ?

## 2021-10-27 NOTE — Anesthesia Preprocedure Evaluation (Addendum)
Anesthesia Evaluation  ?Patient identified by MRN, date of birth, ID band ?Patient awake ? ? ? ?Reviewed: ?Allergy & Precautions, NPO status , Patient's Chart, lab work & pertinent test results ? ?Airway ?Mallampati: III ? ?TM Distance: >3 FB ?Neck ROM: Full ? ? ? Dental ? ?(+) Teeth Intact, Dental Advisory Given ?  ?Pulmonary ?neg pulmonary ROS,  ?  ?Pulmonary exam normal ?breath sounds clear to auscultation ? ? ? ? ? ? Cardiovascular ?negative cardio ROS ?Normal cardiovascular exam ?Rhythm:Regular Rate:Normal ? ? ?  ?Neuro/Psych ?negative neurological ROS ? negative psych ROS  ? GI/Hepatic ?negative GI ROS, Neg liver ROS,   ?Endo/Other  ?negative endocrine ROS ? Renal/GU ?negative Renal ROS  ? ?  ?Musculoskeletal ?negative musculoskeletal ROS ?(+)  ? Abdominal ?  ?Peds ? Hematology ? ?(+) Blood dyscrasia, Sickle cell trait ,   ?Anesthesia Other Findings ?Day of surgery medications reviewed with the patient. ? Reproductive/Obstetrics ?Missed AB ? ?  ? ? ? ? ? ? ? ? ? ? ? ? ? ?  ?  ? ? ? ? ? ? ? ?Anesthesia Physical ?Anesthesia Plan ? ?ASA: 2 ? ?Anesthesia Plan: MAC  ? ?Post-op Pain Management: Tylenol PO (pre-op)* and Minimal or no pain anticipated  ? ?Induction: Intravenous ? ?PONV Risk Score and Plan: 2 and Midazolam, TIVA, Dexamethasone and Ondansetron ? ?Airway Management Planned: Natural Airway and Nasal Cannula ? ?Additional Equipment:  ? ?Intra-op Plan:  ? ?Post-operative Plan:  ? ?Informed Consent: I have reviewed the patients History and Physical, chart, labs and discussed the procedure including the risks, benefits and alternatives for the proposed anesthesia with the patient or authorized representative who has indicated his/her understanding and acceptance.  ? ? ? ?Dental advisory given ? ?Plan Discussed with: CRNA ? ?Anesthesia Plan Comments:   ? ? ? ? ? ? ?Anesthesia Quick Evaluation ? ?

## 2021-10-27 NOTE — H&P (Signed)
Obstetrics & Gynecology Surgical H&P  ? ?Date of Surgery: 10/27/2021  ? ?[facility] ? ?Reason for Admission: scheduled d&c for miscarriage ? ?History of Present Illness: Heidi Martin is a 21 y.o. [phone] (Patient's last menstrual period was 09/02/2021.), with the above CC. PMHx is significant for nothing. ? ?Patient had serial u/s but unfortunately they did not show any progression of the progression of the pregnancy. Patient seen on 4/25 and diagnosed with non viable pregnancy due to u/s at that visit showing a yolk sac but no embryo and she had an u/s on 4/3 that showed a gestational sac.  ? ?She is O POS and denies any s/s of miscarriage since her last clinic visit.  ? ?ROS: A 12-point review of systems was performed and negative, except as stated in the above HPI. ? ?OBGYN History: As per HPI. ?OB History  ?Gravida Para Term Preterm AB Living  ?3 1 1   1 1   ?SAB IAB Ectopic Multiple Live Births  ?0 1   0 1  ?  ?# Outcome Date GA Lbr Len/2nd Weight Sex Delivery Anes PTL Lv  ?3 Current           ?2 Term 11/30/20 [redacted]w[redacted]d 02:51 / 00:35 2605 g F Vag-Spont EPI  LIV  ?1 IAB 2021          ? ?  ?Past Medical History: ?Past Medical History:  ?Diagnosis Date  ? COVID-19   ? Irritable bowel syndrome (IBS)   ? Sickle cell trait ([facility])   ? ? ?Past Surgical History: ?Past Surgical History:  ?Procedure Laterality Date  ? INDUCED ABORTION    ? NO PAST SURGERIES    ? ? ?Family History:  ?Family History  ?Problem Relation Age of Onset  ? Cancer Mother   ? Depression Mother   ? Miscarriages / 2022 Mother   ? Hypertension Mother   ? Hypertension Father   ? Cancer Maternal Grandmother   ? Diabetes Maternal Grandmother   ? Miscarriages / Stillbirths Maternal Grandmother   ? ? ?Social History:  ?Social History  ? ?Socioeconomic History  ? Marital status: Single  ?  Spouse name: Not on file  ? Number of children: Not on file  ? Years of education: Not on file  ? Highest education  level: Not on file  ?Occupational History  ? Not on file  ?Tobacco Use  ? Smoking status: Never  ? Smokeless tobacco: Never  ?Vaping Use  ? Vaping Use: Never used  ?Substance and Sexual Activity  ? Alcohol use: No  ? Drug use: No  ? Sexual activity: Not Currently  ?  Birth control/protection: None  ?Other Topics Concern  ? Not on file  ?Social History Narrative  ? Not on file  ? ?Social Determinants of Health  ? ?Financial Resource Strain: Not on file  ?Food Insecurity: No Food Insecurity  ? Worried About [place] in the Last Year: Never true  ? Ran Out of Food in the Last Year: Never true  ?Transportation Needs: No Transportation Needs  ? Lack of Transportation (Medical): No  ? Lack of Transportation (Non-Medical): No  ?Physical Activity: Not on file  ?Stress: Not on file  ?Social Connections: Not on file  ?Intimate Partner Violence: Not on file  ? ? ?Allergy: ?Allergies  ?Allergen Reactions  ? Milk [Lac Bovis] Nausea And Vomiting  ?  Stomach cramping  ? ? ?Current Outpatient Medications: ?Medications Prior to  Admission  ?Medication Sig Dispense Refill Last Dose  ? promethazine (PHENERGAN) 25 MG tablet Take 1 tablet (25 mg total) by mouth every 6 (six) hours as needed for nausea or vomiting. 30 tablet 0 10/26/2021 at 2355  ? azithromycin (ZITHROMAX) 500 MG tablet Take 2 tablets (1,000 mg total) by mouth daily. (Patient not taking: Reported on 10/26/2021) 2 tablet 0 Not Taking  ? butalbital-acetaminophen-caffeine (FIORICET) 50-325-40 MG tablet Take 1 tablet by mouth every 6 (six) hours as needed for headache. (Patient not taking: Reported on 10/05/2021) 20 tablet 0 Not Taking  ? lidocaine (LIDODERM) 5 % Place 1 patch onto the skin daily as needed for up to 15 doses. Remove & Discard patch within 12 hours or as directed by MD (Patient not taking: Reported on 10/26/2021) 15 patch 0 Not Taking  ? ondansetron (ZOFRAN ODT) 4 MG disintegrating tablet Take 1 tablet (4 mg total) by mouth every 8 (eight) hours as  needed for nausea or vomiting. (Patient not taking: Reported on 10/05/2021) 12 tablet 0 Not Taking  ? ? ? ?Hospital Medications: ?Current Facility-Administered Medications  ?Medication Dose Route Frequency Provider Last Rate Last Admin  ? 0.9 %  sodium chloride infusion   Intravenous Continuous Worthing Bing, MD      ? doxycycline (VIBRAMYCIN) 200 mg in dextrose 5 % 250 mL IVPB  200 mg Intravenous Once Pomaria Bing, MD      ? lactated ringers infusion   Intravenous Continuous Collene Schlichter, MD      ? ? ? ?Physical Exam: ? Current Vital Signs 24h Vital Sign Ranges  ?T 98.4 ?F (36.9 ?C) Temp  Avg: 98.4 ?F (36.9 ?C)  Min: 98.4 ?F (36.9 ?C)  Max: 98.4 ?F (36.9 ?C)  ?BP (!) 104/59 ? BP  Min: 104/59  Max: 104/59  ?HR 99 Pulse  Avg: 99  Min: 99  Max: 99  ?RR 16 Resp  Avg: 16  Min: 16  Max: 16  ?SaO2 98 %   SpO2  Avg: 98 %  Min: 98 %  Max: 98 %  ?    ? 24 Hour I/O Current Shift I/O  ?Time ?Ins ?Outs No intake/output data recorded. No intake/output data recorded.  ? ? ?Body mass index is 27.44 kg/m?. ?General appearance: Well nourished, well developed female in no acute distress.  ?Cardiovascular: S1, S2 normal, no murmur, rub or gallop, regular rate and rhythm ?Respiratory:  Clear to auscultation bilateral. Normal respiratory effort ?Abdomen: positive bowel sounds and no masses, hernias; diffusely non tender to palpation, non distended ?Neuro/Psych:  Normal mood and affect.  ?Skin:  Warm and dry.  ?Extremities: no clubbing, cyanosis, or edema.  ?Lymphatic:  No inguinal lymphadenopathy.  ? ?Laboratory: ?No new labs ? ?  Latest Ref Rng & Units 10/03/2021  ?  3:19 AM 07/21/2021  ?  8:45 AM 07/20/2021  ?  8:13 PM  ?CBC  ?WBC 4.0 - 10.5 K/uL 6.9   11.9   14.4    ?Hemoglobin 12.0 - 15.0 g/dL 14.4   81.8   56.3    ?Hematocrit 36.0 - 46.0 % 32.7   32.8   34.3    ?Platelets 150 - 400 K/uL 268   243   272    ?O POS ? ?Imaging:  ?See above. 4/25 and 4/3 images reviewed by me ? ?Assessment: pt stable ? ?Plan: ?Plan for suction d&c.  Can proceed when OR is ready ? ?Cornelia Copa MD ?Attending ?[facility] [employer] [facility]) ?  [phone] ? ? ?

## 2021-10-27 NOTE — Telephone Encounter (Signed)
See note

## 2021-10-27 NOTE — Anesthesia Postprocedure Evaluation (Addendum)
Anesthesia Post Note ? ?Patient: Heidi Martin ? ?Procedure(s) Performed: DILATATION AND EVACUATION ? ?  ? ?Patient location during evaluation: PACU ?Anesthesia Type: MAC ?Level of consciousness: awake and alert ?Pain management: pain level controlled ?Vital Signs Assessment: post-procedure vital signs reviewed and stable ?Respiratory status: spontaneous breathing, nonlabored ventilation, respiratory function stable and patient connected to nasal cannula oxygen ?Cardiovascular status: blood pressure returned to baseline, stable and tachycardic ?Postop Assessment: no apparent nausea or vomiting ?Anesthetic complications: no ?Comments: Patient reporting chest pain in PACU.  EKG obtained with showed sinus tachycardia.  History of sickle cell trait with pain crises per patient.  Low suspicion for cardiac source of pain.  Improved with additional anxiolytic in PACU. ? ? ?No notable events documented. ? ?Last Vitals:  ?Vitals:  ? 10/27/21 1200 10/27/21 1215  ?BP: 115/74 102/63  ?Pulse: (!) 105 91  ?Resp: 15 12  ?Temp:  36.6 ?C  ?SpO2: 100% 100%  ?  ?Last Pain:  ?Vitals:  ? 10/27/21 1215  ?PainSc: 6   ? ? ?  ?  ?  ?  ?  ?  ? ?[place] ? ? ? ? ?

## 2021-10-27 NOTE — Addendum Note (Signed)
Addendum  created 10/27/21 1431 by Collene Schlichter, MD  ? Clinical Note Signed  ?  ?

## 2021-10-27 NOTE — Discharge Instructions (Addendum)
   We will discuss your surgery once again in detail at your post-op visit in two to four weeks. If you haven't already done so, please call to make your appointment as soon as possible.  Dilation and Curettage or Vacuum Curettage, Care After These instructions give you information on caring for yourself after your procedure. Your doctor may also give you more specific instructions. Call your doctor if you have any problems or questions after your procedure. HOME CARE Do not drive for 24 hours. Wait 1 week before doing any activities that wear you out. Do not stand for a long time. Limit stair climbing to once or twice a day. Rest often. Continue with your usual diet. Drink enough fluids to keep your pee (urine) clear or pale yellow. If you have a hard time pooping (constipation), you may: Take a medicine to help you go poop (laxative) as told by your doctor. Eat more fruit and bran. Drink more fluids. Take showers, not baths, for as long as told by your doctor. Do not swim or use a hot tub until your doctor says it is okay. Have someone with you for 1day after the procedure. Do not douche, use tampons, or have sex (intercourse) until seen by your doctor Only take medicines as told by your doctor. Do not take aspirin. It can cause bleeding. Keep all doctor visits. GET HELP IF: You have cramps or pain not helped by medicine. You have new pain in the belly (abdomen). You have a bad smelling fluid coming from your vagina. You have a rash. You have problems with any medicine. GET HELP RIGHT AWAY IF:  You start to bleed more than a regular period. You have a fever. You have chest pain. You have trouble breathing. You feel dizzy or feel like passing out (fainting). You pass out. You have pain in the tops of your shoulders. You have vaginal bleeding with or without clumps of blood (blood clots). MAKE SURE YOU: Understand these instructions. Will watch your condition. Will get help  right away if you are not doing well or get worse. Document Released: 03/28/2008 Document Revised: 06/24/2013 Document Reviewed: 01/16/2013 Exit[employer] Patient Information 2015 [employer]. This information is not intended to replace advice given to you by your health care provider. Make sure you discuss any questions you have with your health care provider.   

## 2021-10-27 NOTE — Anesthesia Procedure Notes (Signed)
Procedure Name: Intubation ?Date/Time: 10/27/2021 10:50 AM ?Performed by: Clearnce Sorrel, CRNA ?Pre-anesthesia Checklist: Patient identified, Emergency Drugs available, Suction available and Patient being monitored ?Patient Re-evaluated:Patient Re-evaluated prior to induction ?Oxygen Delivery Method: Circle System Utilized ?Preoxygenation: Pre-oxygenation with 100% oxygen ?Induction Type: IV induction ?Ventilation: Mask ventilation without difficulty ?Laryngoscope Size: Mac and 3 ?Tube type: Oral ?Tube size: 7.0 mm ?Number of attempts: 1 ?Airway Equipment and Method: Stylet and Oral airway ?Placement Confirmation: ETT inserted through vocal cords under direct vision, positive ETCO2 and breath sounds checked- equal and bilateral ?Secured at: 22 cm ?Tube secured with: Tape ?Dental Injury: Teeth and Oropharynx as per pre-operative assessment  ? ? ? ? ?

## 2021-10-28 ENCOUNTER — Ambulatory Visit: Payer: Medicaid Other

## 2021-10-28 ENCOUNTER — Encounter (HOSPITAL_COMMUNITY): Payer: Self-pay | Admitting: Obstetrics and Gynecology

## 2021-10-28 ENCOUNTER — Telehealth: Payer: Self-pay | Admitting: Family Medicine

## 2021-10-28 LAB — SURGICAL PATHOLOGY

## 2021-10-28 NOTE — Telephone Encounter (Signed)
Patient called after hours RN line with poorly controlled pain. She was directed to come to [facility] because she used the RX that prescribed last night by Dr. Rip Harbour and her pain is not improved.  ? ? ?

## 2021-11-02 ENCOUNTER — Other Ambulatory Visit: Payer: Medicaid Other

## 2021-11-02 ENCOUNTER — Other Ambulatory Visit: Payer: Self-pay

## 2021-11-02 ENCOUNTER — Ambulatory Visit: Payer: Medicaid Other

## 2021-11-02 DIAGNOSIS — O0289 Other abnormal products of conception: Secondary | ICD-10-CM

## 2021-11-04 ENCOUNTER — Emergency Department (HOSPITAL_COMMUNITY)
Admission: EM | Admit: 2021-11-04 | Discharge: 2021-11-05 | Discharge status: Against Medical Advice | Payer: Medicaid Other | Attending: Emergency Medicine | Admitting: Emergency Medicine

## 2021-11-04 ENCOUNTER — Other Ambulatory Visit: Payer: Self-pay

## 2021-11-04 DIAGNOSIS — J029 Acute pharyngitis, unspecified: Secondary | ICD-10-CM | POA: Diagnosis not present

## 2021-11-04 DIAGNOSIS — Z5321 Procedure and treatment not carried out due to patient leaving prior to being seen by health care provider: Secondary | ICD-10-CM | POA: Insufficient documentation

## 2021-11-05 ENCOUNTER — Other Ambulatory Visit: Payer: Self-pay

## 2021-11-05 ENCOUNTER — Encounter (HOSPITAL_COMMUNITY): Payer: Self-pay

## 2021-11-05 LAB — GROUP A STREP BY PCR: Group A Strep by PCR: NOT DETECTED

## 2021-11-05 NOTE — ED Triage Notes (Signed)
Pt had sore throat for 3 days. Went to pcp and covid and strep were negative. While in the lobby opt tongue began to feel funny and began to swell. Pt has noticed "white clumps" in the back of her throat. Denies fevers. Airway intact. Pt speaking in full sentences and managing secretions ?

## 2021-11-07 ENCOUNTER — Telehealth: Payer: Self-pay

## 2021-11-07 NOTE — Telephone Encounter (Signed)
-----   Message from [place] Bing, MD sent at 11/06/2021 10:01 PM EDT ----- ?Regarding: concern for molar pregnancy ?It looks like she no showed to her 5/3 hcg visit. Please have her come in asap for one due to concern for molar pregnancy which will be discussed with her further at her next MD visit ? ?

## 2021-11-07 NOTE — Telephone Encounter (Signed)
Pt did not answer. Left VM to return call to office to schedule beta ASAP per Dr Ilda Basset. Mychart message sent to patient.  ?Jaicob Dia,RN  ?

## 2021-11-08 NOTE — Telephone Encounter (Signed)
Called patient, no answer- left message to call us back. 

## 2021-11-09 ENCOUNTER — Other Ambulatory Visit: Payer: Medicaid Other

## 2021-11-09 DIAGNOSIS — O0289 Other abnormal products of conception: Secondary | ICD-10-CM

## 2021-11-09 NOTE — Telephone Encounter (Signed)
Called patient, no answer- left message to call us back. Unable to reach emergency contact.  ? ?Will send mychart message ?

## 2021-11-10 LAB — BETA HCG QUANT (REF LAB): hCG Quant: 308 m[IU]/mL

## 2021-11-15 ENCOUNTER — Telehealth: Payer: Self-pay

## 2021-11-15 NOTE — Telephone Encounter (Signed)
-----   Message from [place] Bing, MD sent at 11/15/2021  2:38 PM EDT ----- ?Please let her know that everything looks okay with falling pregnancy hormone levels and she just needs to come in every week to make sure they continue to go down and stay down. Please have her come in on the 18th for a repeat nonstop Beta. Thanks. ?

## 2021-11-15 NOTE — Telephone Encounter (Signed)
I called patient and reviewed her results with her. Appointment scheduled for a non stat beta on 11/17/21. Patient verbalized understanding. Patient also states she is still having a lot of pain following her procedure. Per patient she was prescribed percocet and she has run out of them. I recommended patient try taking extra strength Tylenol or Ibuprofen to help with the pain. Per patient she has tried this and it has not helped. I recommended patient try heat or ice to aid with the pain. Patient also states she has tried this and it has not helped. I called first attending- Dr. Damita Dunnings answered and I explained patient is still having a significant amount of pain since her procedure. Per Dr. Damita Dunnings, patient had procedure almost 3 weeks ago, if patient is still having significant pain as described she should be seen by a provider for evaluation. I reviewed this with the patient. Patient states she does not want to have to wait in a waiting room at the hospital to be seen by a provider. I acknowledged patient's concern about this but expressed it is recommended she be seen by a provider. Patient states she will think about it. I reviewed signs and symptoms of infection with patient- patient denies experiencing these. I reviewed patient's upcoming follow up appointment with her.  ? ?Paulina Fusi, RN ?11/15/21 ?

## 2021-11-17 ENCOUNTER — Other Ambulatory Visit: Payer: Medicaid Other

## 2021-11-17 ENCOUNTER — Other Ambulatory Visit: Payer: Self-pay

## 2021-11-17 DIAGNOSIS — O039 Complete or unspecified spontaneous abortion without complication: Secondary | ICD-10-CM

## 2021-11-23 ENCOUNTER — Ambulatory Visit: Payer: Medicaid Other | Admitting: Obstetrics and Gynecology

## 2021-11-24 ENCOUNTER — Ambulatory Visit: Payer: Medicaid Other | Admitting: Obstetrics and Gynecology

## 2021-12-14 ENCOUNTER — Telehealth: Payer: Self-pay | Admitting: Family Medicine

## 2021-12-14 NOTE — Telephone Encounter (Signed)
Called patient to see if we could reschedule her appt from 5/25 but patient didn't answer, Left message to call us back

## 2022-01-27 IMAGING — CT CT HEAD W/O CM
4 series · 17 of 47 positions shown, 19 images · non-contrast
Comparison: March 2020

CLINICAL DATA: Headache, chronic, new features or increased
frequency



[Series 3: head wo · axial · 0.39mm/px · z∈[-154,-34]mm · 7 of 32 slices shown, 9 images]
[im 4/32  brain]
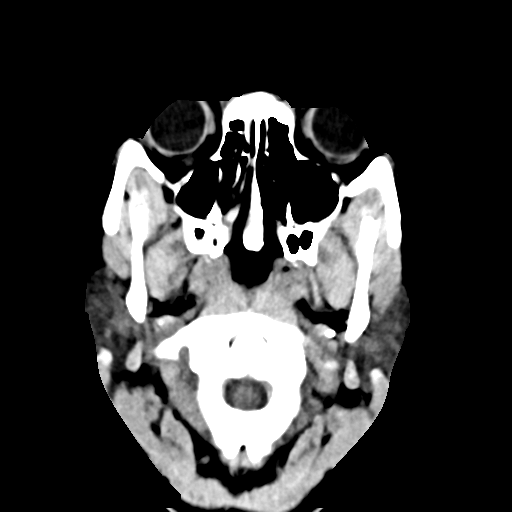
[im 4/32  bone]
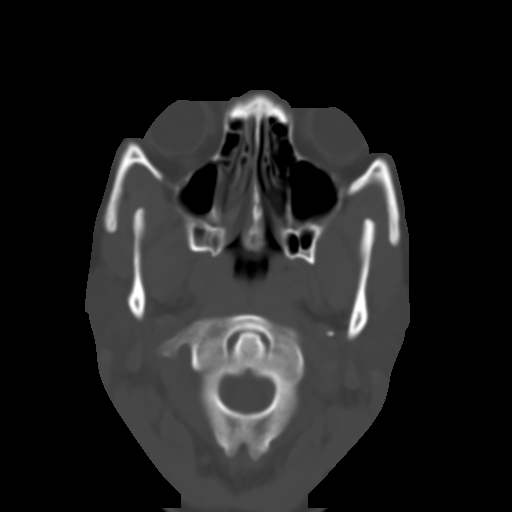
[im 8/32  brain]
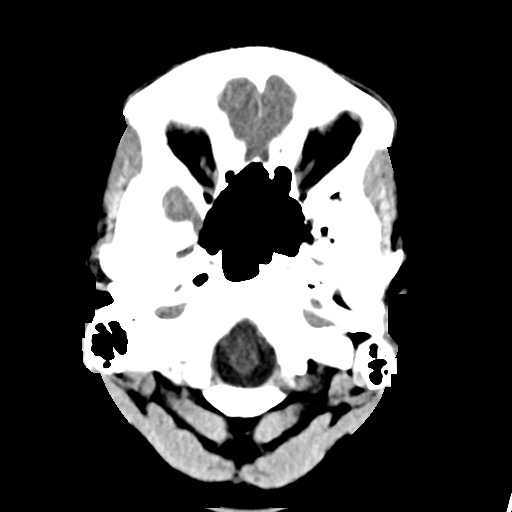
[im 12/32  brain]
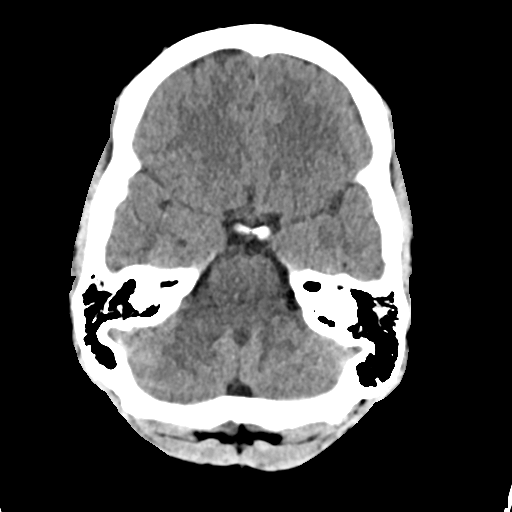
[im 16/32  brain]
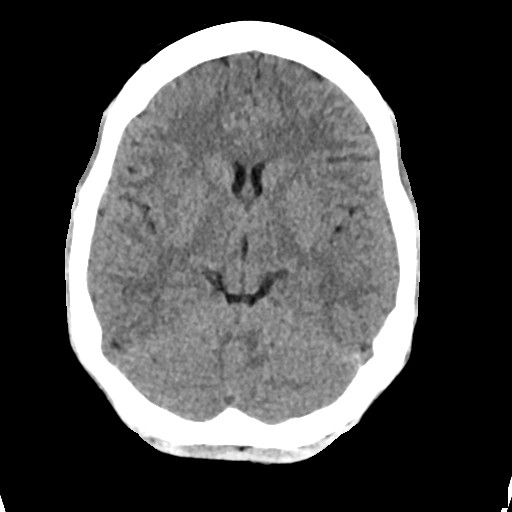
[im 20/32  brain]
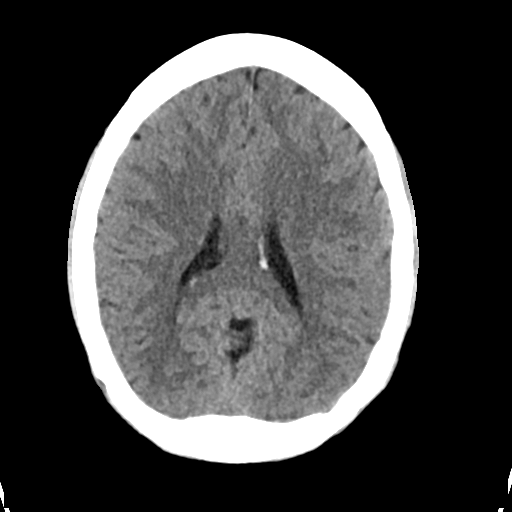
[im 20/32  bone]
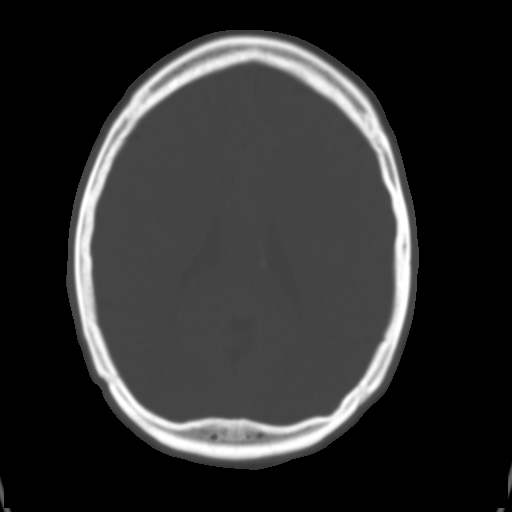
[im 24/32  brain]
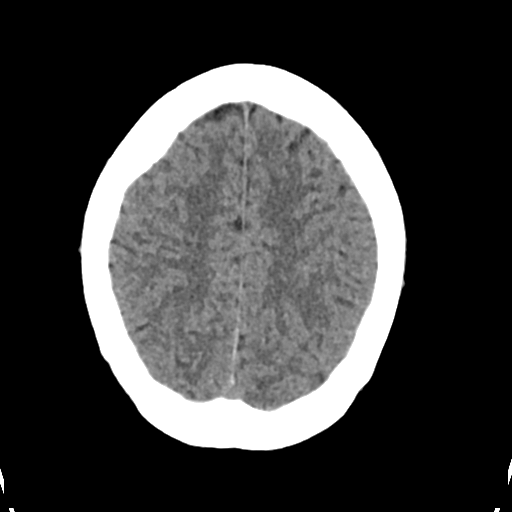
[im 28/32  brain]
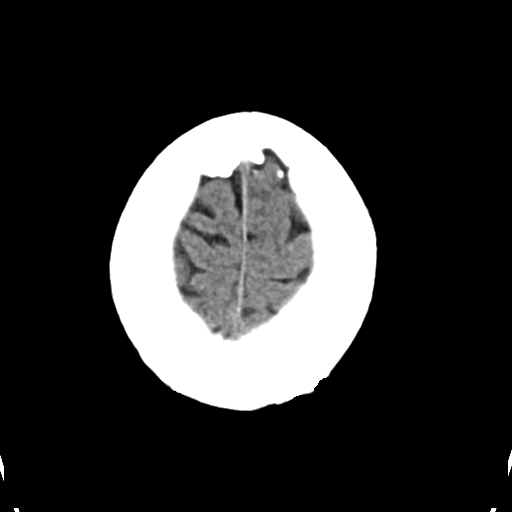

[Series 4: head bone · axial · 0.39mm/px · z∈[-156,-100]mm · 4 of 80 slices shown]
[im 8/80  bone]
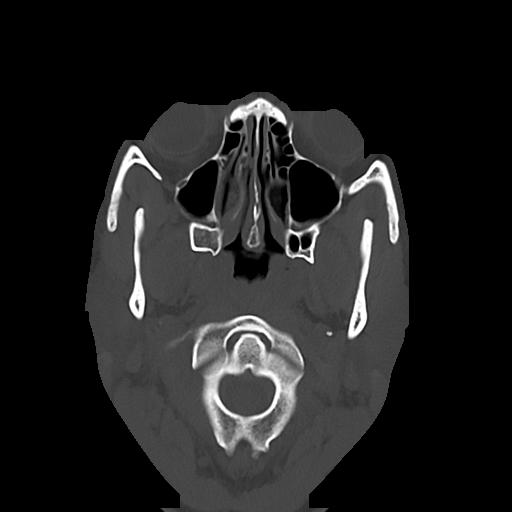
[im 16/80  bone]
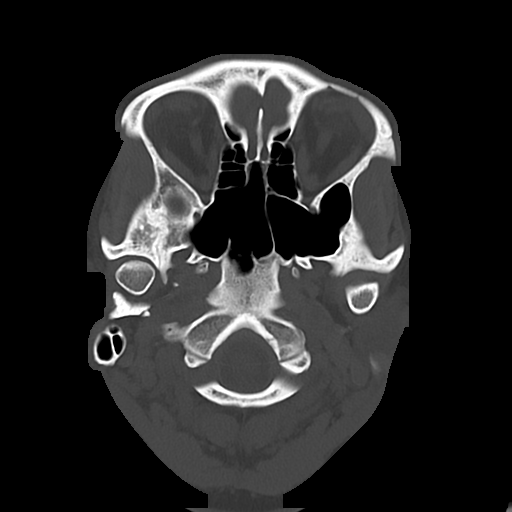
[im 24/80  bone]
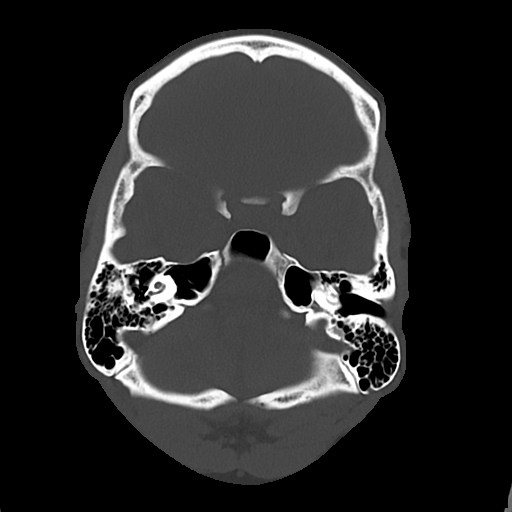
[im 36/80  bone]
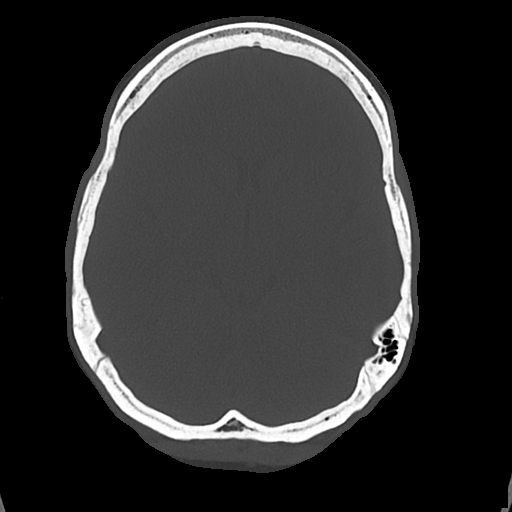

[Series 5: cor soft · coronal · 0.33mm/px · 3 of 70 slices shown]
[im 26/70  brain]
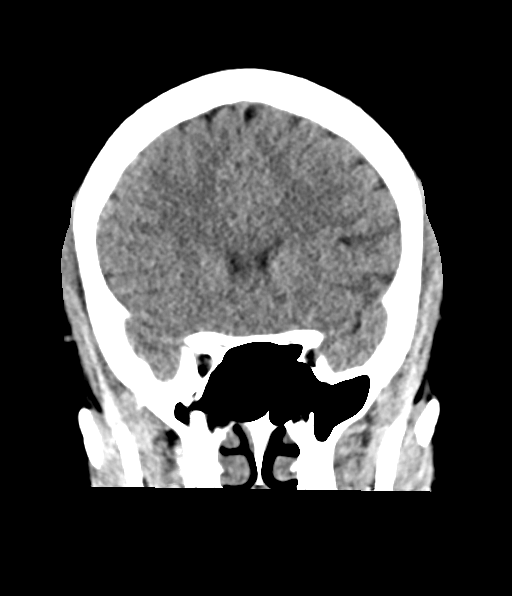
[im 32/70  brain]
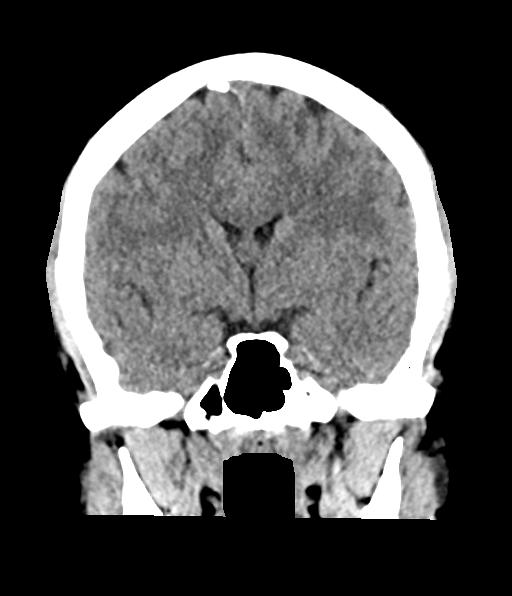
[im 38/70  brain]
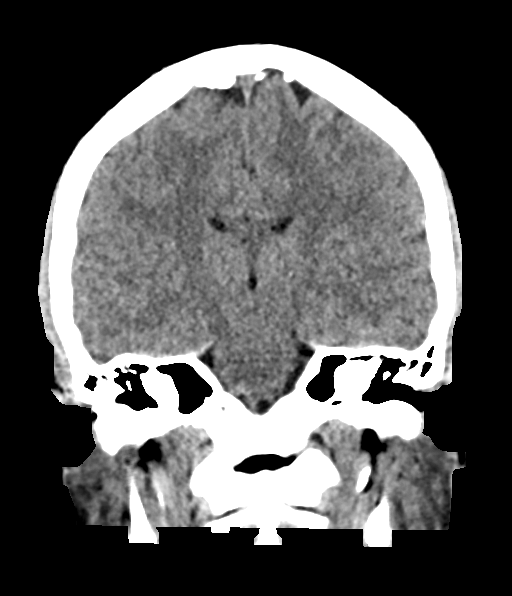

[Series 6: sag soft · sagittal · 0.39mm/px · 3 of 57 slices shown]
[im 19/57  brain]
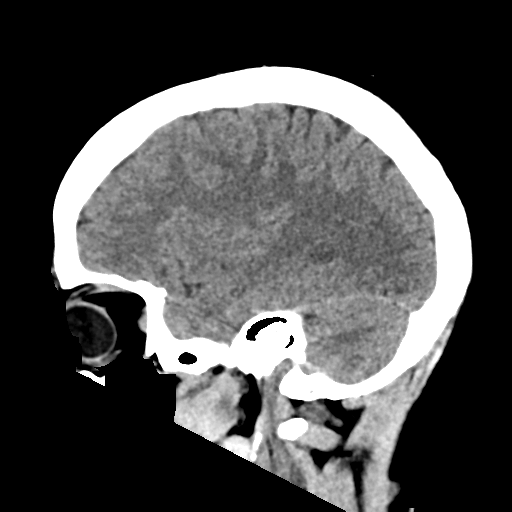
[im 29/57  brain]
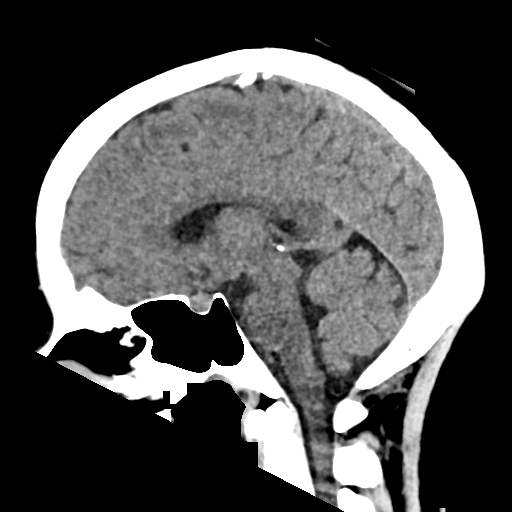
[im 38/57  brain]
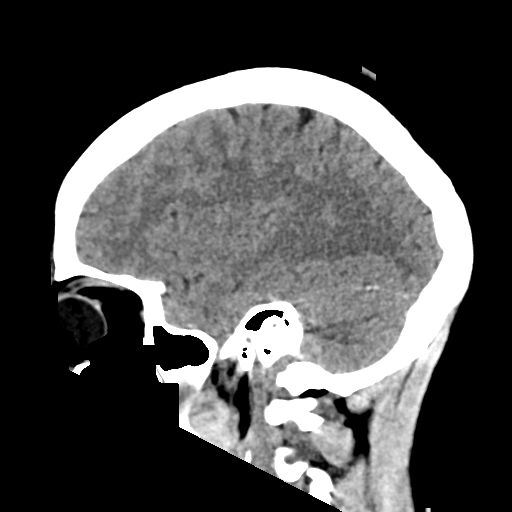

[17 of 47 positions shown; findings below may reference images not displayed]

FINDINGS: Brain: There is no acute intracranial hemorrhage, mass effect, or
edema. Gray-white differentiation is preserved. There is no
extra-axial fluid collection. Ventricles and sulci are within normal
limits in size and configuration.

Vascular: No hyperdense vessel or unexpected calcification.

Skull: Calvarium is unremarkable.

Sinuses/Orbits: No acute finding.

Other: None.
IMPRESSION: No acute intracranial abnormality.

## 2022-01-27 IMAGING — DX DG CHEST 1V PORT
1 series · 1 of 1 positions shown · non-contrast
Comparison: Portable chest 09/12/2019.

CLINICAL DATA: 20-year-old female with sickle cell disease. Body
ache, pain since yesterday.

EXAM:
PORTABLE CHEST 1 VIEW

[chest ap]
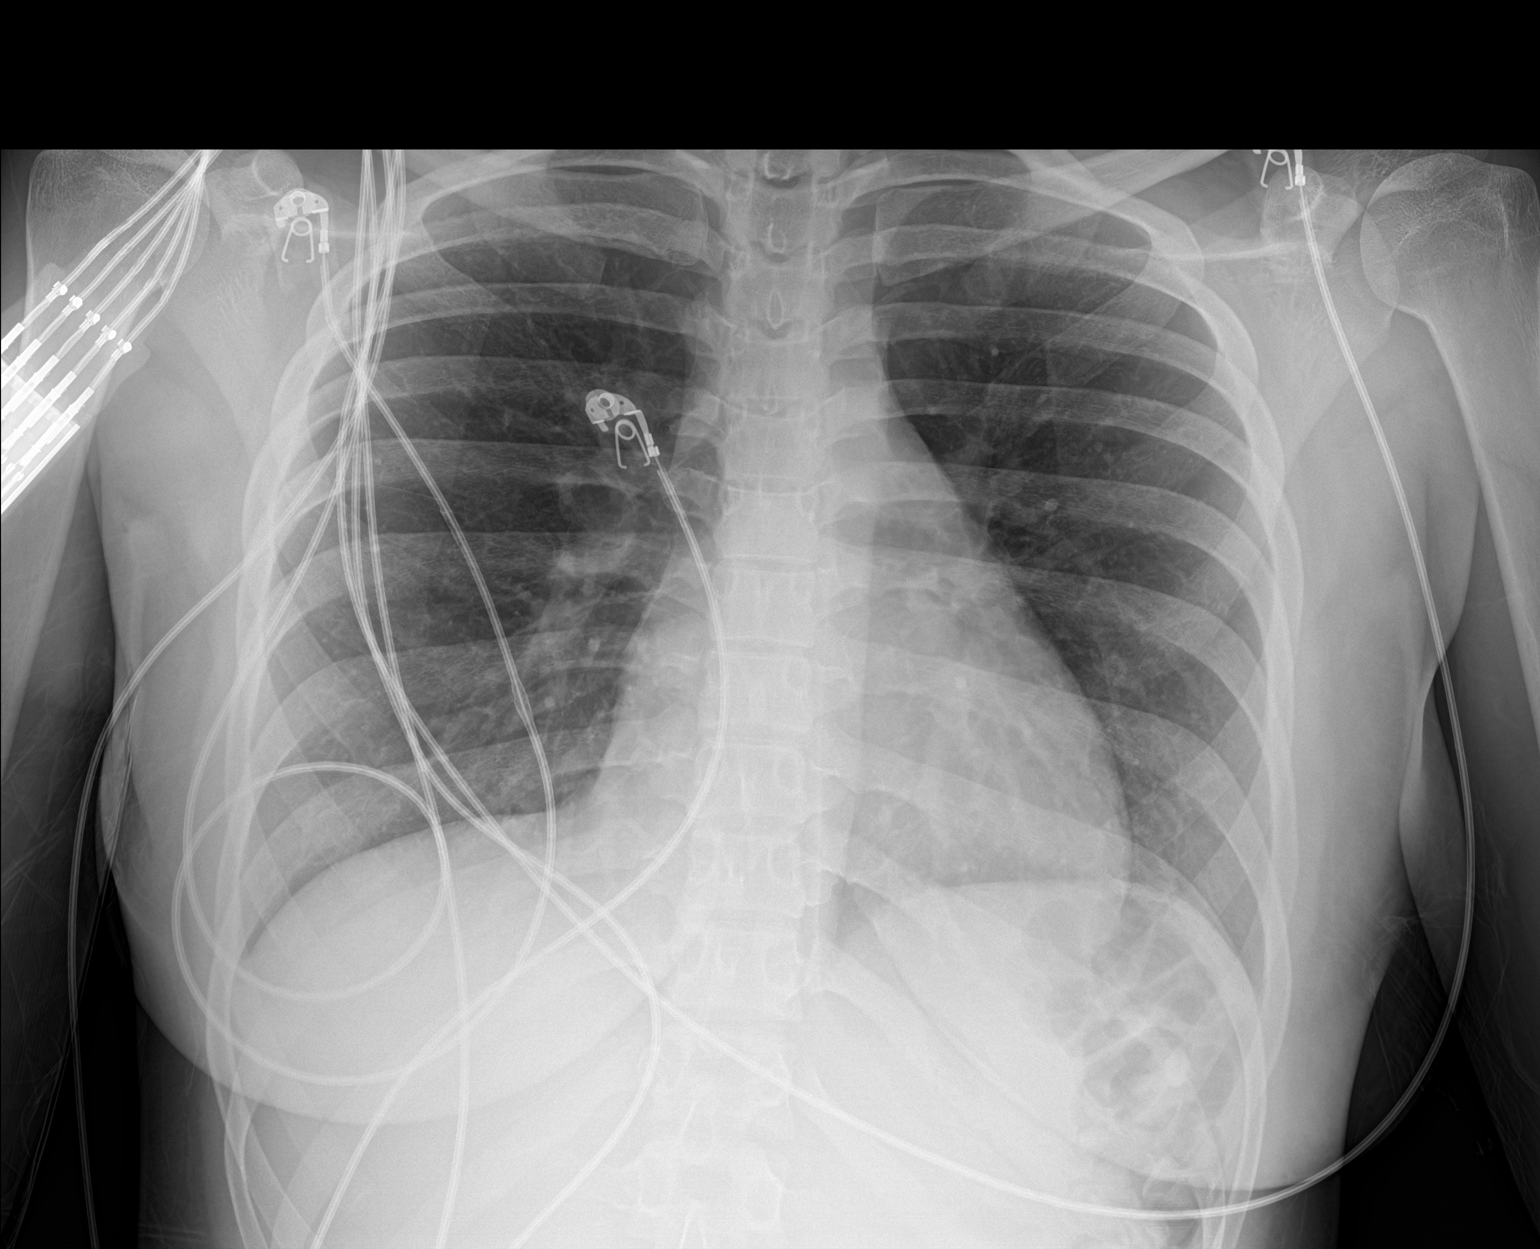

[1 of 1 positions shown; findings below may reference images not displayed]

FINDINGS: Portable AP semi upright view at 6935 hours. Lung volumes and
mediastinal contours are stable and within normal limits. Visualized
tracheal air column is within normal limits. Allowing for portable
technique the lungs are clear. No pneumothorax. No osseous
abnormality identified. Negative visible bowel gas.
IMPRESSION: Negative portable chest.

## 2022-02-17 ENCOUNTER — Encounter (HOSPITAL_COMMUNITY): Payer: Self-pay

## 2022-02-17 ENCOUNTER — Ambulatory Visit (HOSPITAL_COMMUNITY)
Admission: EM | Admit: 2022-02-17 | Discharge: 2022-02-17 | Disposition: A | Discharge status: Home / Self Care | Payer: Medicaid Other | Attending: Family Medicine | Admitting: Family Medicine

## 2022-02-17 DIAGNOSIS — R519 Headache, unspecified: Secondary | ICD-10-CM | POA: Diagnosis not present

## 2022-02-17 DIAGNOSIS — R102 Pelvic and perineal pain: Secondary | ICD-10-CM | POA: Diagnosis not present

## 2022-02-17 LAB — POCT URINALYSIS DIPSTICK, ED / UC
Bilirubin Urine: NEGATIVE
Glucose, UA: NEGATIVE mg/dL
Hgb urine dipstick: NEGATIVE
Ketones, ur: NEGATIVE mg/dL
Leukocytes,Ua: NEGATIVE
Nitrite: NEGATIVE
Protein, ur: NEGATIVE mg/dL
Specific Gravity, Urine: 1.02 (ref 1.005–1.030)
Urobilinogen, UA: 0.2 mg/dL (ref 0.0–1.0)
pH: 6.5 (ref 5.0–8.0)

## 2022-02-17 LAB — POC URINE PREG, ED: Preg Test, Ur: NEGATIVE

## 2022-02-17 MED ORDER — KETOROLAC TROMETHAMINE 30 MG/ML IJ SOLN
30.0000 mg | Freq: Once | INTRAMUSCULAR | Status: AC
  Administered 2022-02-17: 30 mg via INTRAMUSCULAR

## 2022-02-17 MED ORDER — KETOROLAC TROMETHAMINE 30 MG/ML IJ SOLN
INTRAMUSCULAR | Status: AC
  Filled 2022-02-17: qty 1

## 2022-02-17 NOTE — Discharge Instructions (Addendum)
The urinalysis was negative  Pregnancy test was negative  You have been given a shot of Toradol 30 mg today.  Ondansetron dissolved in the mouth every 8 hours as needed for nausea or vomiting.   Take famotidine 20 mg--1 tablet 2 times daily.  This is for stomach acid and acid reflux  If your headache returns or does not go away, consider going to the emergency room for further evaluation that we cannot accomplish here for you in the urgent care  You can use the QR code/website at the back of the summary paperwork to schedule yourself a new patient appointment with primary care

## 2022-02-17 NOTE — ED Triage Notes (Signed)
Pt is here for headache and stomach pain x2wks and constipation

## 2022-02-17 NOTE — ED Provider Notes (Signed)
[facility]    CSN: [id number] Arrival date & time: 02/17/22  1308      History   Chief Complaint Chief Complaint  Patient presents with   Abdominal Pain    Pt has been having stomach pain and headaches x2wks    HPI Heidi Martin is a 21 y.o. female.    Abdominal Pain  Here for lower stomach pain, mainly in the suprapubic area, this been bothering her for about 2 weeks.  She also has had some left frontal headache and left occipital headache.  Is tried ibuprofen.  She has had a lot of nausea and vomiting in the mornings.  She is very sensitive to smells.  Last menstrual cycle was July 24.  She states that cycle came at the right time.  No fever or chills.  No diarrhea.  She is actually feeling a little constipated and her last bowel movement was yesterday.  No dysuria.  She has had some vaginal discharge  Past Medical History:  Diagnosis Date   Alpha thalassemia silent carrier    COVID-19    Irritable bowel syndrome (IBS)    Sickle cell trait [facility])     Patient Active Problem List   Diagnosis Date Noted   Nonviable pregnancy 10/25/2021   History of therapeutic abortion 06/23/2020   Sickle cell trait (HCC) 05/31/2015    Past Surgical History:  Procedure Laterality Date   DILATION AND EVACUATION N/A 10/27/2021   Procedure: DILATATION AND EVACUATION;  Surgeon: [place] Bing, MD;  Location: [facility] OR;  Service: Gynecology;  Laterality: N/A;   INDUCED ABORTION     NO PAST SURGERIES      OB History     Gravida  3   Para  1   Term  1   Preterm      AB  1   Living  1      SAB  0   IAB  1   Ectopic      Multiple  0   Live Births  1            Home Medications    Prior to Admission medications   Medication Sig Start Date End Date Taking? Authorizing Provider  docusate sodium (COLACE) 100 MG capsule Take 1 capsule (100 mg total) by mouth daily. Can increase to twice per day for constipation 10/27/21   [place] Bing, MD   ferrous sulfate (FERROUSUL) 325 (65 FE) MG tablet Take 1 tablet (325 mg total) by mouth every other day. 10/27/21 11/26/21  [place] Bing, MD  ibuprofen (ADVIL) 600 MG tablet Take 1 tablet (600 mg total) by mouth every 6 (six) hours as needed. 10/27/21   [place] Bing, MD  lidocaine (LIDODERM) 5 % Place 1 patch onto the skin daily as needed for up to 15 doses. Remove & Discard patch within 12 hours or as directed by MD Patient not taking: Reported on 10/26/2021 07/21/21   Tanda Rockers A, DO  ondansetron (ZOFRAN ODT) 4 MG disintegrating tablet Take 1 tablet (4 mg total) by mouth every 8 (eight) hours as needed for nausea or vomiting. Patient not taking: Reported on 10/05/2021 08/30/21   Benjiman Core, MD  oxyCODONE-acetaminophen (PERCOCET/ROXICET) 5-325 MG tablet Take 1 tablet by mouth every 6 (six) hours as needed. 10/27/21   [place] Bing, MD  promethazine (PHENERGAN) 25 MG tablet Take 1 tablet (25 mg total) by mouth every 6 (six) hours as needed for nausea or vomiting. 10/05/21   Edd Arbour  R, CNM  dicyclomine (BENTYL) 20 MG tablet Take 1 tablet (20 mg total) by mouth 2 (two) times daily. Patient not taking: No sig reported 11/14/19 02/14/20  Renne Crigler, PA-C  metoCLOPramide (REGLAN) 10 MG tablet Take 1 tablet (10 mg total) by mouth 3 (three) times daily between meals as needed for nausea. Patient not taking: Reported on 02/14/2020 10/04/19 02/14/20  Leftwich-Kirby, Wilmer Floor, CNM    Family History Family History  Problem Relation Age of Onset   Cancer Mother    Depression Mother    Miscarriages / Stillbirths Mother    Hypertension Mother    Hypertension Father    Cancer Maternal Grandmother    Diabetes Maternal Grandmother    Miscarriages / Stillbirths Maternal Grandmother     Social History Social History   Tobacco Use   Smoking status: Never   Smokeless tobacco: Never  Vaping Use   Vaping Use: Never used  Substance Use Topics   Alcohol use: No   Drug use: No      Allergies   Milk [milk (cow)]   Review of Systems Review of Systems  Gastrointestinal:  Positive for abdominal pain.     Physical Exam Triage Vital Signs ED Triage Vitals  Enc Vitals Group     BP 02/17/22 1335 106/73     Pulse Rate 02/17/22 1335 73     Resp 02/17/22 1335 12     Temp 02/17/22 1335 99 F (37.2 C)     Temp Source 02/17/22 1335 Oral     SpO2 02/17/22 1335 98 %     Weight 02/17/22 1331 174 lb 12.8 oz (79.3 kg)     Height 02/17/22 1331 5\' 7"  (1.702 m)     Head Circumference --      Peak Flow --      Pain Score 02/17/22 1333 6     Pain Loc --      Pain Edu? --      Excl. in GC? --    No data found.  Updated Vital Signs BP 106/73 (BP Location: Right Arm)   Pulse 73   Temp 99 F (37.2 C) (Oral)   Resp 12   Ht 5\' 7"  (1.702 m)   Wt 79.3 kg   LMP 01/23/2022   SpO2 98%   Breastfeeding Unknown   BMI 27.38 kg/m   Visual Acuity Right Eye Distance:   Left Eye Distance:   Bilateral Distance:    Right Eye Near:   Left Eye Near:    Bilateral Near:     Physical Exam Vitals reviewed.  Constitutional:      General: She is not in acute distress.    Appearance: She is not toxic-appearing.  HENT:     Nose: Nose normal.     Mouth/Throat:     Mouth: Mucous membranes are moist.     Pharynx: No oropharyngeal exudate or posterior oropharyngeal erythema.  Eyes:     Extraocular Movements: Extraocular movements intact.     Conjunctiva/sclera: Conjunctivae normal.     Pupils: Pupils are equal, round, and reactive to light.  Cardiovascular:     Rate and Rhythm: Normal rate and regular rhythm.     Heart sounds: No murmur heard. Pulmonary:     Effort: Pulmonary effort is normal. No respiratory distress.     Breath sounds: No wheezing, rhonchi or rales.  Chest:     Chest wall: No tenderness.  Abdominal:     General: There is no distension.  Palpations: Abdomen is soft.     Tenderness: There is no abdominal tenderness. There is no guarding.   Musculoskeletal:     Cervical back: Neck supple.  Lymphadenopathy:     Cervical: No cervical adenopathy.  Skin:    Capillary Refill: Capillary refill takes less than 2 seconds.     Coloration: Skin is not jaundiced or pale.  Neurological:     General: No focal deficit present.     Mental Status: She is alert and oriented to person, place, and time.  Psychiatric:        Behavior: Behavior normal.      UC Treatments / Results  Labs (all labs ordered are listed, but only abnormal results are displayed) Labs Reviewed  POCT URINALYSIS DIPSTICK, ED / UC  POC URINE PREG, ED  CERVICOVAGINAL ANCILLARY ONLY    EKG   Radiology No results found.  Procedures Procedures (including critical care time)  Medications Ordered in UC Medications  ketorolac (TORADOL) 30 MG/ML injection 30 mg (has no administration in time range)    Initial Impression / Assessment and Plan / UC Course  I have reviewed the triage vital signs and the nursing notes.  Pertinent labs & imaging results that were available during my care of the patient were reviewed by me and considered in my medical decision making (see chart for details).     UPT is negative  Urinalysis is negative  Recommended treat the headache with a one-time shot of Toradol.  I am going to send in Pepcid for possible reflux, and also some Zofran for the nausea.  She can use the [place] website to schedule an appointment for PCP Final Clinical Impressions(s) / UC Diagnoses   Final diagnoses:  Pelvic pain in female  Intractable headache, unspecified chronicity pattern, unspecified headache type     Discharge Instructions      The urinalysis was negative  Pregnancy test was negative  You have been given a shot of Toradol 30 mg today.  Ondansetron dissolved in the mouth every 8 hours as needed for nausea or vomiting.   Take famotidine 20 mg--1 tablet 2 times daily.  This is for stomach acid and acid reflux  If your  headache returns or does not go away, consider going to the emergency room for further evaluation that we cannot accomplish here for you in the urgent care  You can use the QR code/website at the back of the summary paperwork to schedule yourself a new patient appointment with primary care      ED Prescriptions   None    PDMP not reviewed this encounter.   Barrett Henle, MD 02/17/22 [phone]

## 2022-02-20 LAB — CERVICOVAGINAL ANCILLARY ONLY
Bacterial Vaginitis (gardnerella): POSITIVE — AB
Candida Glabrata: NEGATIVE
Candida Vaginitis: NEGATIVE
Chlamydia: NEGATIVE
Comment: NEGATIVE
Comment: NEGATIVE
Comment: NEGATIVE
Comment: NEGATIVE
Comment: NEGATIVE
Comment: NORMAL
Neisseria Gonorrhea: NEGATIVE
Trichomonas: NEGATIVE

## 2022-02-21 ENCOUNTER — Telehealth (HOSPITAL_COMMUNITY): Payer: Self-pay | Admitting: Emergency Medicine

## 2022-02-21 MED ORDER — METRONIDAZOLE 500 MG PO TABS: 500.0000 mg | ORAL_TABLET | Freq: Two times a day (BID) | ORAL | 0 refills | Status: DC

## 2022-03-03 ENCOUNTER — Ambulatory Visit (HOSPITAL_COMMUNITY)
Admission: EM | Admit: 2022-03-03 | Discharge: 2022-03-03 | Disposition: A | Discharge status: Home / Self Care | Payer: Medicaid Other | Attending: Emergency Medicine | Admitting: Emergency Medicine

## 2022-03-03 ENCOUNTER — Encounter (HOSPITAL_COMMUNITY): Payer: Self-pay | Admitting: Emergency Medicine

## 2022-03-03 DIAGNOSIS — J069 Acute upper respiratory infection, unspecified: Secondary | ICD-10-CM

## 2022-03-03 DIAGNOSIS — Z20822 Contact with and (suspected) exposure to covid-19: Secondary | ICD-10-CM | POA: Insufficient documentation

## 2022-03-03 LAB — POC INFLUENZA A AND B ANTIGEN (URGENT CARE ONLY)
INFLUENZA A ANTIGEN, POC: NEGATIVE
INFLUENZA B ANTIGEN, POC: NEGATIVE

## 2022-03-03 MED ORDER — IBUPROFEN 800 MG PO TABS: 800.0000 mg | ORAL_TABLET | Freq: Three times a day (TID) | ORAL | 0 refills | Status: DC

## 2022-03-03 MED ORDER — CYCLOBENZAPRINE HCL 10 MG PO TABS: 10.0000 mg | ORAL_TABLET | Freq: Two times a day (BID) | ORAL | 0 refills | Status: DC | PRN

## 2022-03-03 NOTE — ED Triage Notes (Signed)
Pt reports sore throat, nasal congestion, headache and generalized body aches. States symptoms began yesterday. Has been taking tylenol and ibuprofen for relief.

## 2022-03-03 NOTE — Discharge Instructions (Addendum)
We will contact you if your COVID test is positive.  Please quarantine while you wait for the results.  If your test is negative you may resume normal activities.  If your test is positive please continue to quarantine for at least 5 days from your symptom onset , if symptoms are still present after 5 days please wear masks while completing activities.  Flu test is negative  May use ibuprofen every 8 hours for headaches, may use with tylenol  to strengthen effect  May sue muscle relaxer twice a day to help with body aches, this medicine has the potential to make you drowsy      For cough: honey 1/2 to 1 teaspoon (you can dilute the honey in water or another fluid).  You can also use guaifenesin and dextromethorphan for cough. You can use a humidifier for chest congestion and cough.  If you don't have a humidifier, you can sit in the bathroom with the hot shower running.      For sore throat: try warm salt water gargles, cepacol lozenges, throat spray, warm tea or water with lemon/honey, popsicles or ice, or OTC cold relief medicine for throat discomfort.   For congestion: take a daily anti-histamine like Zyrtec, Claritin, and a oral decongestant, such as pseudoephedrine.  You can also use Flonase 1-2 sprays in each nostril daily.   It is important to stay hydrated: drink plenty of fluids (water, gatorade/powerade/pedialyte, juices, or teas) to keep your throat moisturized and help further relieve irritation/discomfort.

## 2022-03-03 NOTE — ED Provider Notes (Signed)
[facility]    CSN: [id number] Arrival date & time: 03/03/22  1428      History   Chief Complaint Chief Complaint  Patient presents with   Sore Throat   Headache   Generalized Body Aches   Nasal Congestion    HPI Heidi Martin is a 21 y.o. female.   Patient presents with increased fatigue, nasal congestion, rhinorrhea, sore throat, nonproductive cough and intermittent generalized headaches for 1 day.  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  Decreased appetite but tolerating fluids.  Has attempted use of Tylenol and ibuprofen which have been minimally helpful.  History of IBS and sickle cell trait.    Past Medical History:  Diagnosis Date   Alpha thalassemia silent carrier    COVID-19    Irritable bowel syndrome (IBS)    Sickle cell trait [facility])     Patient Active Problem List   Diagnosis Date Noted   Nonviable pregnancy 10/25/2021   History of therapeutic abortion 06/23/2020   Sickle cell trait (HCC) 05/31/2015    Past Surgical History:  Procedure Laterality Date   DILATION AND EVACUATION N/A 10/27/2021   Procedure: DILATATION AND EVACUATION;  Surgeon: Playita Bing, MD;  Location: [facility];  Service: Gynecology;  Laterality: N/A;   INDUCED ABORTION     NO PAST SURGERIES      OB History     Gravida  3   Para  1   Term  1   Preterm      AB  1   Living  1      SAB  0   IAB  1   Ectopic      Multiple  0   Live Births  1            Home Medications    Prior to Admission medications   Medication Sig Start Date End Date Taking? Authorizing Provider  docusate sodium (COLACE) 100 MG capsule Take 1 capsule (100 mg total) by mouth daily. Can increase to twice per day for constipation 10/27/21   [place] Bing, MD  ferrous sulfate (FERROUSUL) 325 (65 FE) MG tablet Take 1 tablet (325 mg total) by mouth every other day. 10/27/21 11/26/21  [place] Bing, MD  ibuprofen (ADVIL) 600 MG tablet Take 1 tablet (600 mg total) by  mouth every 6 (six) hours as needed. 10/27/21   [place] Bing, MD  lidocaine (LIDODERM) 5 % Place 1 patch onto the skin daily as needed for up to 15 doses. Remove & Discard patch within 12 hours or as directed by MD Patient not taking: Reported on 10/26/2021 07/21/21   Tanda Rockers A, DO  metroNIDAZOLE (FLAGYL) 500 MG tablet Take 1 tablet (500 mg total) by mouth 2 (two) times daily. 02/21/22   Merrilee Jansky, MD  ondansetron (ZOFRAN ODT) 4 MG disintegrating tablet Take 1 tablet (4 mg total) by mouth every 8 (eight) hours as needed for nausea or vomiting. Patient not taking: Reported on 10/05/2021 08/30/21   Benjiman Core, MD  oxyCODONE-acetaminophen (PERCOCET/ROXICET) 5-325 MG tablet Take 1 tablet by mouth every 6 (six) hours as needed. 10/27/21    Bing, MD  promethazine (PHENERGAN) 25 MG tablet Take 1 tablet (25 mg total) by mouth every 6 (six) hours as needed for nausea or vomiting. 10/05/21   Bernerd Limbo, CNM  dicyclomine (BENTYL) 20 MG tablet Take 1 tablet (20 mg total) by mouth 2 (two) times daily. Patient not taking: No sig  reported 11/14/19 02/14/20  Renne Crigler, PA-C  metoCLOPramide (REGLAN) 10 MG tablet Take 1 tablet (10 mg total) by mouth 3 (three) times daily between meals as needed for nausea. Patient not taking: Reported on 02/14/2020 10/04/19 02/14/20  Leftwich-Kirby, Wilmer Floor, CNM    Family History Family History  Problem Relation Age of Onset   Cancer Mother    Depression Mother    Miscarriages / Stillbirths Mother    Hypertension Mother    Hypertension Father    Cancer Maternal Grandmother    Diabetes Maternal Grandmother    Miscarriages / Stillbirths Maternal Grandmother     Social History Social History   Tobacco Use   Smoking status: Never   Smokeless tobacco: Never  Vaping Use   Vaping Use: Never used  Substance Use Topics   Alcohol use: No   Drug use: No     Allergies   Milk [milk (cow)]   Review of Systems Review of Systems   Constitutional:  Positive for appetite change and fatigue. Negative for activity change, chills, diaphoresis, fever and unexpected weight change.  HENT:  Positive for congestion, rhinorrhea and sore throat. Negative for dental problem, drooling, ear discharge, ear pain, facial swelling, hearing loss, mouth sores, nosebleeds, postnasal drip, sinus pressure, sinus pain, sneezing, tinnitus, trouble swallowing and voice change.   Respiratory:  Positive for cough. Negative for apnea, choking, chest tightness, shortness of breath, wheezing and stridor.   Cardiovascular: Negative.   Gastrointestinal: Negative.   Skin: Negative.   Neurological:  Positive for headaches. Negative for dizziness, tremors, seizures, syncope, facial asymmetry, speech difficulty, weakness, light-headedness and numbness.     Physical Exam Triage Vital Signs ED Triage Vitals  Enc Vitals Group     BP 03/03/22 1452 103/73     Pulse Rate 03/03/22 1452 85     Resp 03/03/22 1452 16     Temp 03/03/22 1452 98.2 F (36.8 C)     Temp Source 03/03/22 1452 Oral     SpO2 03/03/22 1452 100 %     Weight --      Height --      Head Circumference --      Peak Flow --      Pain Score 03/03/22 1450 10     Pain Loc --      Pain Edu? --      Excl. in GC? --    No data found.  Updated Vital Signs BP 103/73 (BP Location: Left Arm)   Pulse 85   Temp 98.2 F (36.8 C) (Oral)   Resp 16   LMP 01/23/2022   SpO2 100%   Visual Acuity Right Eye Distance:   Left Eye Distance:   Bilateral Distance:    Right Eye Near:   Left Eye Near:    Bilateral Near:     Physical Exam Constitutional:      Appearance: She is well-developed. She is ill-appearing.  HENT:     Head: Normocephalic.     Right Ear: Tympanic membrane and ear canal normal.     Left Ear: Tympanic membrane and ear canal normal.     Nose: Congestion and rhinorrhea present.     Mouth/Throat:     Mouth: Mucous membranes are moist.     Pharynx: No posterior  oropharyngeal erythema.     Tonsils: No tonsillar exudate. 0 on the right. 0 on the left.  Eyes:     Extraocular Movements: Extraocular movements intact.  Cardiovascular:  Rate and Rhythm: Normal rate and regular rhythm.     Heart sounds: Normal heart sounds.  Pulmonary:     Effort: Pulmonary effort is normal.     Breath sounds: Normal breath sounds.  Musculoskeletal:     Cervical back: Normal range of motion and neck supple.  Skin:    General: Skin is warm and dry.  Neurological:     General: No focal deficit present.     Mental Status: She is alert and oriented to person, place, and time.  Psychiatric:        Mood and Affect: Mood normal.        Behavior: Behavior normal.      UC Treatments / Results  Labs (all labs ordered are listed, but only abnormal results are displayed) Labs Reviewed - No data to display  EKG   Radiology No results found.  Procedures Procedures (including critical care time)  Medications Ordered in UC Medications - No data to display  Initial Impression / Assessment and Plan / UC Course  I have reviewed the triage vital signs and the nursing notes.  Pertinent labs & imaging results that were available during my care of the patient were reviewed by me and considered in my medical decision making (see chart for details).  Viral URI with cough  Patient is in no signs of distress nor toxic appearing.  Vital signs are stable.  Low suspicion for pneumonia, pneumothorax or bronchitis and therefore will defer imaging.  Flu test negative, COVID test is pending, reviewed quarantine guidelines per CDC recommendations  Prescribed ibuprofen 800 mg and Flexeril as headaches and body aches are most worrisome symptoms today   May use additional over-the-counter medications as needed for supportive care.  May follow-up with urgent care as needed if symptoms persist or worsen.  Note given.   Final Clinical Impressions(s) / UC Diagnoses   Final  diagnoses:  None   Discharge Instructions   None    ED Prescriptions   None    PDMP not reviewed this encounter.   Valinda Hoar, NP 03/03/22 1530

## 2022-03-04 LAB — SARS CORONAVIRUS 2 (TAT 6-24 HRS): SARS Coronavirus 2: NEGATIVE

## 2022-04-13 ENCOUNTER — Inpatient Hospital Stay (HOSPITAL_COMMUNITY)
Admission: EM | Admit: 2022-04-13 | Discharge: 2022-04-13 | Disposition: A | Discharge status: Home / Self Care | Payer: Medicaid Other | Attending: Obstetrics and Gynecology | Admitting: Obstetrics and Gynecology

## 2022-04-13 ENCOUNTER — Encounter (HOSPITAL_COMMUNITY): Payer: Self-pay | Admitting: Emergency Medicine

## 2022-04-13 ENCOUNTER — Other Ambulatory Visit: Payer: Self-pay

## 2022-04-13 DIAGNOSIS — S00452A Superficial foreign body of left ear, initial encounter: Secondary | ICD-10-CM | POA: Diagnosis not present

## 2022-04-13 DIAGNOSIS — Z3A01 Less than 8 weeks gestation of pregnancy: Secondary | ICD-10-CM | POA: Diagnosis not present

## 2022-04-13 DIAGNOSIS — O26893 Other specified pregnancy related conditions, third trimester: Secondary | ICD-10-CM | POA: Insufficient documentation

## 2022-04-13 DIAGNOSIS — Z3A31 31 weeks gestation of pregnancy: Secondary | ICD-10-CM | POA: Diagnosis not present

## 2022-04-13 DIAGNOSIS — Z3201 Encounter for pregnancy test, result positive: Secondary | ICD-10-CM | POA: Insufficient documentation

## 2022-04-13 DIAGNOSIS — W458XXA Other foreign body or object entering through skin, initial encounter: Secondary | ICD-10-CM | POA: Diagnosis not present

## 2022-04-13 DIAGNOSIS — O9A213 Injury, poisoning and certain other consequences of external causes complicating pregnancy, third trimester: Secondary | ICD-10-CM | POA: Insufficient documentation

## 2022-04-13 DIAGNOSIS — O212 Late vomiting of pregnancy: Secondary | ICD-10-CM | POA: Diagnosis not present

## 2022-04-13 DIAGNOSIS — O219 Vomiting of pregnancy, unspecified: Secondary | ICD-10-CM

## 2022-04-13 LAB — I-STAT BETA HCG BLOOD, ED (MC, WL, AP ONLY): I-stat hCG, quantitative: 44 m[IU]/mL — ABNORMAL HIGH (ref <5)

## 2022-04-13 MED ORDER — COMPLETENATE 29-1 MG PO CHEW: 1.0000 | CHEWABLE_TABLET | Freq: Every day | ORAL | 12 refills | Status: DC

## 2022-04-13 MED ORDER — ONDANSETRON 4 MG PO TBDP: 4.0000 mg | ORAL_TABLET | Freq: Four times a day (QID) | ORAL | 0 refills | Status: DC | PRN

## 2022-04-13 NOTE — ED Provider Notes (Signed)
[place] EMERGENCY DEPARTMENT Provider Note   CSN: [id number] Arrival date & time: 04/13/22  0035     History  Chief Complaint  Patient presents with   Foreign Body    Heidi Martin is a 21 y.o. female with a hx of IBS who presents to the ED with complaints of earring being stuck in her left ear.  Patient reports she had an earring back stuck in the back of her left ear for the past 1 week, she has tried getting this out with tweezers without success.  The area is somewhat painful.  No alleviating or aggravating factors.  She denies fever or chills.   Upon further discussion patient also mentions concern for pregnancy, she states that she has been having some suprapubic abdominal discomfort intermittently for the past week and a half with nausea and vomiting. She is unsure of the exact date of her last period.  Denies vaginal bleeding, vaginal discharge, or dysuria.  HPI     Home Medications Prior to Admission medications   Medication Sig Start Date End Date Taking? Authorizing Provider  cyclobenzaprine (FLEXERIL) 10 MG tablet Take 1 tablet (10 mg total) by mouth 2 (two) times daily as needed for muscle spasms. 03/03/22   White, Leitha Schuller, NP  docusate sodium (COLACE) 100 MG capsule Take 1 capsule (100 mg total) by mouth daily. Can increase to twice per day for constipation 10/27/21   Aletha Halim, MD  ferrous sulfate (FERROUSUL) 325 (65 FE) MG tablet Take 1 tablet (325 mg total) by mouth every other day. 10/27/21 11/26/21  Aletha Halim, MD  ibuprofen (ADVIL) 800 MG tablet Take 1 tablet (800 mg total) by mouth 3 (three) times daily. 03/03/22   White, Leitha Schuller, NP  lidocaine (LIDODERM) 5 % Place 1 patch onto the skin daily as needed for up to 15 doses. Remove & Discard patch within 12 hours or as directed by MD Patient not taking: Reported on 10/26/2021 07/21/21   Wynona Dove A, DO  metroNIDAZOLE (FLAGYL) 500 MG tablet Take 1 tablet (500 mg total) by mouth 2  (two) times daily. 02/21/22   Chase Picket, MD  ondansetron (ZOFRAN ODT) 4 MG disintegrating tablet Take 1 tablet (4 mg total) by mouth every 8 (eight) hours as needed for nausea or vomiting. Patient not taking: Reported on 10/05/2021 08/30/21   Davonna Belling, MD  oxyCODONE-acetaminophen (PERCOCET/ROXICET) 5-325 MG tablet Take 1 tablet by mouth every 6 (six) hours as needed. 10/27/21   Aletha Halim, MD  promethazine (PHENERGAN) 25 MG tablet Take 1 tablet (25 mg total) by mouth every 6 (six) hours as needed for nausea or vomiting. 10/05/21   Gabriel Carina, CNM  dicyclomine (BENTYL) 20 MG tablet Take 1 tablet (20 mg total) by mouth 2 (two) times daily. Patient not taking: No sig reported 11/14/19 02/14/20  Carlisle Cater, PA-C  metoCLOPramide (REGLAN) 10 MG tablet Take 1 tablet (10 mg total) by mouth 3 (three) times daily between meals as needed for nausea. Patient not taking: Reported on 02/14/2020 10/04/19 02/14/20  Leftwich-Kirby, Kathie Dike, CNM      Allergies    Milk [milk (cow)]    Review of Systems   Review of Systems  Constitutional:  Negative for chills and fever.  Respiratory:  Negative for shortness of breath.   Cardiovascular:  Negative for chest pain.  Gastrointestinal:  Positive for abdominal pain, nausea and vomiting.  Genitourinary:  Negative for dysuria, vaginal bleeding and vaginal discharge.  Skin:        Positive for left ear foreign body.  Neurological:  Negative for syncope.  All other systems reviewed and are negative.   Physical Exam Updated Vital Signs BP 122/84 (BP Location: Right Arm)   Pulse 78   Temp 98 F (36.7 C)   Resp 18   LMP 09/02/2021   SpO2 100%  Physical Exam Vitals and nursing note reviewed.  Constitutional:      General: She is not in acute distress.    Appearance: She is well-developed. She is not toxic-appearing.  HENT:     Head: Normocephalic and atraumatic.     Ears:   Eyes:     General:        Right eye: No discharge.         Left eye: No discharge.     Conjunctiva/sclera: Conjunctivae normal.  Cardiovascular:     Rate and Rhythm: Normal rate and regular rhythm.  Pulmonary:     Effort: No respiratory distress.     Breath sounds: Normal breath sounds. No wheezing or rales.  Abdominal:     General: There is no distension.     Palpations: Abdomen is soft.     Tenderness: There is no abdominal tenderness.  Musculoskeletal:     Cervical back: Neck supple.  Skin:    General: Skin is warm and dry.  Neurological:     Mental Status: She is alert.     Comments: Clear speech.   Psychiatric:        Behavior: Behavior normal.     ED Results / Procedures / Treatments   Labs (all labs ordered are listed, but only abnormal results are displayed) Labs Reviewed  I-STAT BETA HCG BLOOD, ED ([facility], [facility], [facility] ONLY)    EKG None  Radiology No results found.  Procedures .Foreign Body Removal  Date/Time: 04/13/2022 1:08 AM  Performed by: Amaryllis Dyke, PA-C Authorized by: Amaryllis Dyke, PA-C  Consent: Verbal consent obtained. Risks and benefits: risks, benefits and alternatives were discussed Consent given by: patient Body area: skin General location: head/neck Location details: left ear  Sedation: Patient sedated: no  Patient restrained: no Patient cooperative: yes Removal mechanism: hemostat Complexity: simple 2 objects recovered. Objects recovered: earring & earring back Post-procedure assessment: foreign body removed Patient tolerance: patient tolerated the procedure well with no immediate complications      Medications Ordered in ED Medications - No data to display  ED Course/ Medical Decision Making/ A&P                           Medical Decision Making Risk Decision regarding hospitalization.   Patient presents to the emergency department with earring embedded in her left ear which was removed per procedure note above.  Nontoxic, resting comfortably, vitals within normal  limits.  Chart/Nursing note reviewed for additional history.   I-STAT beta-hCG was ordered given patient concern for pregnancy, this is mildly elevated at 44.  I discussed with MAU APP Lelan Pons, accepts patient in transfer.  Final Clinical Impression(s) / ED Diagnoses Final diagnoses:  Embedded earring of left ear, initial encounter  Positive pregnancy test    Rx / DC Orders ED Discharge Orders     None         Amaryllis Dyke, PA-C [id number]    Delora Fuel, MD [id number] [phone]

## 2022-04-13 NOTE — ED Triage Notes (Signed)
Pt reported to ED with c/o earring back being stuck in left earlobe. Pt states she was unable to remove it at home.

## 2022-04-13 NOTE — MAU Provider Note (Signed)
Chief Complaint: Foreign Body and pregnancy (Positive pregnancy test)  Seen by CNM at 0200      SUBJECTIVE HPI: Heidi Martin is a 21 y.o. [id number] at [redacted]w[redacted]d by LMP who presents from ED to maternity admissions reporting having some pelvic cramping a week ago.  ED provider stated that pt reported having pain now.  I Stat showed HCG of 44.  Pt denies pain this week  some nausea. . She denies vaginal bleeding, vaginal itching/burning, urinary symptoms, h/a, dizziness, n/v, or fever/chills.    Abdominal Pain This is a new problem. The current episode started 1 to 4 weeks ago. The problem occurs rarely. The problem has been resolved since onset. The pain is located in the suprapubic region. The patient is experiencing no pain. The quality of the pain is described as cramping. Pertinent negatives include no anorexia, constipation, diarrhea, fever or frequency. Nothing relieves the symptoms. Past treatments include nothing.    Past Medical History:  Diagnosis Date   Alpha thalassemia silent carrier    COVID-19    Irritable bowel syndrome (IBS)    Sickle cell trait ([place])    Past Surgical History:  Procedure Laterality Date   DILATION AND EVACUATION N/A 10/27/2021   Procedure: DILATATION AND EVACUATION;  Surgeon: Aletha Halim, MD;  Location: [place];  Service: Gynecology;  Laterality: N/A;   INDUCED ABORTION     NO PAST SURGERIES     Social History   Socioeconomic History   Marital status: Single    Spouse name: Not on file   Number of children: Not on file   Years of education: Not on file   Highest education level: Not on file  Occupational History   Not on file  Tobacco Use   Smoking status: Never   Smokeless tobacco: Never  Vaping Use   Vaping Use: Never used  Substance and Sexual Activity   Alcohol use: No   Drug use: No   Sexual activity: Not Currently    Birth control/protection: None  Other Topics Concern   Not on file  Social History Narrative   Not on file    Social Determinants of Health   Financial Resource Strain: Not on file  Food Insecurity: No Food Insecurity (01/05/2021)   Hunger Vital Sign    Worried About Running Out of Food in the Last Year: Never true    Ran Out of Food in the Last Year: Never true  Transportation Needs: No Transportation Needs (01/05/2021)   PRAPARE - Hydrologist (Medical): No    Lack of Transportation (Non-Medical): No  Recent Concern: Transportation Needs - Unmet Transportation Needs (12/06/2020)   PRAPARE - Hydrologist (Medical): Yes    Lack of Transportation (Non-Medical): Yes  Physical Activity: Not on file  Stress: Not on file  Social Connections: Not on file  Intimate Partner Violence: Not on file   No current facility-administered medications on file prior to encounter.   Current Outpatient Medications on File Prior to Encounter  Medication Sig Dispense Refill   cyclobenzaprine (FLEXERIL) 10 MG tablet Take 1 tablet (10 mg total) by mouth 2 (two) times daily as needed for muscle spasms. 20 tablet 0   docusate sodium (COLACE) 100 MG capsule Take 1 capsule (100 mg total) by mouth daily. Can increase to twice per day for constipation 30 capsule 1   ferrous sulfate (FERROUSUL) 325 (65 FE) MG tablet Take 1 tablet (325 mg total) by  mouth every other day. 15 tablet 0   ibuprofen (ADVIL) 800 MG tablet Take 1 tablet (800 mg total) by mouth 3 (three) times daily. 21 tablet 0   lidocaine (LIDODERM) 5 % Place 1 patch onto the skin daily as needed for up to 15 doses. Remove & Discard patch within 12 hours or as directed by MD (Patient not taking: Reported on 10/26/2021) 15 patch 0   metroNIDAZOLE (FLAGYL) 500 MG tablet Take 1 tablet (500 mg total) by mouth 2 (two) times daily. 14 tablet 0   ondansetron (ZOFRAN ODT) 4 MG disintegrating tablet Take 1 tablet (4 mg total) by mouth every 8 (eight) hours as needed for nausea or vomiting. (Patient not taking: Reported on  10/05/2021) 12 tablet 0   oxyCODONE-acetaminophen (PERCOCET/ROXICET) 5-325 MG tablet Take 1 tablet by mouth every 6 (six) hours as needed. 4 tablet 0   promethazine (PHENERGAN) 25 MG tablet Take 1 tablet (25 mg total) by mouth every 6 (six) hours as needed for nausea or vomiting. 30 tablet 0   [DISCONTINUED] dicyclomine (BENTYL) 20 MG tablet Take 1 tablet (20 mg total) by mouth 2 (two) times daily. (Patient not taking: No sig reported) 20 tablet 0   [DISCONTINUED] metoCLOPramide (REGLAN) 10 MG tablet Take 1 tablet (10 mg total) by mouth 3 (three) times daily between meals as needed for nausea. (Patient not taking: Reported on 02/14/2020) 30 tablet 0   Allergies  Allergen Reactions   Milk [Milk (Cow)] Nausea And Vomiting    Stomach cramping    I have reviewed patient's Past Medical Hx, Surgical Hx, Family Hx, Social Hx, medications and allergies.   ROS:  Review of Systems  Constitutional:  Negative for fever.  Gastrointestinal:  Positive for abdominal pain. Negative for anorexia, constipation and diarrhea.  Genitourinary:  Negative for frequency.   Review of Systems  Other systems negative   Physical Exam  Physical Exam Patient Vitals for the past 24 hrs:  BP Temp Temp src Pulse Resp SpO2 Height Weight  04/13/22 0155 -- 98.1 F (36.7 C) -- -- -- -- -- --  04/13/22 0154 124/85 -- Oral 74 18 100 % 5\' 7"  (1.702 m) 82.6 kg  04/13/22 0058 122/84 98 F (36.7 C) -- 78 18 100 % -- --   Constitutional: Well-developed, well-nourished female in no acute distress.  Cardiovascular: normal rate Respiratory: normal effort GI: Abd soft, non-tender.  MS: Extremities nontender, no edema, normal ROM Neurologic: Alert and oriented x 4.  GU: Neg CVAT.  LAB RESULTS Results for orders placed or performed during the hospital encounter of 04/13/22 (from the past 24 hour(s))  I-Stat beta hCG blood, ED     Status: Abnormal   Collection Time: 04/13/22 12:58 AM  Result Value Ref Range   I-stat hCG,  quantitative 44.0 (H) <5 mIU/mL   Comment 3            --/--/O POS (04/27 1128)  IMAGING No results found.  MAU Management/MDM: I have reviewed the triage vital signs and the nursing notes.   Pertinent labs & imaging results that were available during my care of the patient were reviewed by me and considered in my medical decision making (see chart for details).      I have reviewed her medical records including past results, notes and treatments.   Reviewed usual presentation requiring workup for pregnancy of unknown location. Since she has had no pain in a week, will not pursue additional testing tonight.   Reviewed signs  of pain or bleeding which would necessitate that workup     Given her recent SAB, will have her come back Saturday AM for repeat HCG to see if we need to schedule further workup  Will prescribe zofran for nausea and PNV  ASSESSMENT 1. Embedded earring of left ear, initial encounter   2. Positive pregnancy test   3. Nausea/vomiting in pregnancy     PLAN Discharge home Plan to repeat HCG level in 48 hours in MAU Saturday AM   If if goes up will schedule [place] in 2 weeks  Ectopic precautions Rx Zofran and PNV sent to pharmacy   Follow-up Information     [facility]. Go in 2 day(s).   Specialty: Obstetrics and Gynecology Why: Sat am Contact information: [address] [id number] [place] [address] [phone]               Pt stable at time of discharge. Encouraged to return here if she develops worsening of symptoms, increase in pain, fever, or other concerning symptoms.    Wynelle Bourgeois CNM, MSN Certified Nurse-Midwife 04/13/2022  2:15 AM

## 2022-04-13 NOTE — MAU Note (Signed)
.  Heidi Martin is a 21 y.o. at [redacted]w[redacted]d here in MAU reporting: transferred from [facility]. Pt stated she had a pos preg HCG test in the ED. Pt report not taking any previous preg test. Pt states came in to ED for her earring back being stuck on. Pt denies VB, abnormal discharge, or pain at this time.  LMP: 03/24/2022 Pain score: 0/10 Vitals:   04/13/22 0154 04/13/22 0155  BP: 124/85   Pulse: 74   Resp: 18   Temp:  98.1 F (36.7 C)  SpO2: 100%       Lab orders placed from triage:

## 2022-04-15 ENCOUNTER — Inpatient Hospital Stay (HOSPITAL_COMMUNITY)
Admission: AD | Admit: 2022-04-15 | Discharge: 2022-04-15 | Disposition: A | Discharge status: Home / Self Care | Payer: Medicaid Other | Attending: Obstetrics & Gynecology | Admitting: Obstetrics & Gynecology

## 2022-04-15 ENCOUNTER — Encounter (HOSPITAL_COMMUNITY): Payer: Self-pay | Admitting: Obstetrics & Gynecology

## 2022-04-15 ENCOUNTER — Inpatient Hospital Stay (HOSPITAL_COMMUNITY): Payer: Medicaid Other

## 2022-04-15 ENCOUNTER — Other Ambulatory Visit: Payer: Self-pay

## 2022-04-15 DIAGNOSIS — O3680X0 Pregnancy with inconclusive fetal viability, not applicable or unspecified: Secondary | ICD-10-CM

## 2022-04-15 DIAGNOSIS — O26891 Other specified pregnancy related conditions, first trimester: Secondary | ICD-10-CM | POA: Diagnosis present

## 2022-04-15 DIAGNOSIS — Z3A01 Less than 8 weeks gestation of pregnancy: Secondary | ICD-10-CM | POA: Diagnosis not present

## 2022-04-15 LAB — HCG, QUANTITATIVE, PREGNANCY: hCG, Beta Chain, Quant, S: 211 m[IU]/mL — ABNORMAL HIGH (ref <5)

## 2022-04-15 NOTE — Discharge Instructions (Addendum)
Return to care  If you have heavier bleeding that soaks through more than 2 pads per hour for an hour or more If you bleed so much that you feel like you might pass out or you do pass out If you have significant abdominal pain that is not improved with Tylenol    Safe Medications in Pregnancy   Acne: Benzoyl Peroxide Salicylic Acid  Backache/Headache: Tylenol: 2 regular strength every 4 hours OR              2 Extra strength every 6 hours  Colds/Coughs/Allergies: Benadryl (alcohol free) 25 mg every 6 hours as needed Breath right strips Claritin Cepacol throat lozenges Chloraseptic throat spray Cold-Eeze- up to three times per day Cough drops, alcohol free Flonase (by prescription only) Guaifenesin Mucinex Robitussin DM (plain only, alcohol free) Saline nasal spray/drops Sudafed (pseudoephedrine) & Actifed ** use only after [redacted] weeks gestation and if you do not have high blood pressure Tylenol Vicks Vaporub Zinc lozenges Zyrtec   Constipation: Colace Ducolax suppositories Fleet enema Glycerin suppositories Metamucil Milk of magnesia Miralax Senokot Smooth move tea  Diarrhea: Kaopectate Imodium A-D  *NO pepto Bismol  Hemorrhoids: Anusol Anusol HC Preparation H Tucks  Indigestion: Tums Maalox Mylanta Zantac  Pepcid  Insomnia: Benadryl (alcohol free) 25mg every 6 hours as needed Tylenol PM Unisom, no Gelcaps  Leg Cramps: Tums MagGel  Nausea/Vomiting:  Bonine Dramamine Emetrol Ginger extract Sea bands Meclizine  Nausea medication to take during pregnancy:  Unisom (doxylamine succinate 25 mg tablets) Take one tablet daily at bedtime. If symptoms are not adequately controlled, the dose can be increased to a maximum recommended dose of two tablets daily (1/2 tablet in the morning, 1/2 tablet mid-afternoon and one at bedtime). Vitamin B6 100mg tablets. Take one tablet twice a day (up to 200 mg per day).  Skin Rashes: Aveeno  products Benadryl cream or 25mg every 6 hours as needed Calamine Lotion 1% cortisone cream  Yeast infection: Gyne-lotrimin 7 Monistat 7  Gum/tooth pain: Anbesol  **If taking multiple medications, please check labels to avoid duplicating the same active ingredients **take medication as directed on the label ** Do not exceed 4000 mg of tylenol in 24 hours **Do not take medications that contain aspirin or ibuprofen    

## 2022-04-15 NOTE — MAU Note (Signed)
Heidi Martin is a 21 y.o. at [redacted]w[redacted]d here in MAU reporting: she's here for blood work.  Reports intermittent sharp lower abdominal pain  that began 2 days ago.  Reports taking Tylenol, last taken @ 0400 this morning. Denies VB. LMP: N/A Onset of complaint: 2 days ago Pain score: 9 Vitals:   04/15/22 1149  BP: 114/73  Pulse: 80  Resp: 18  Temp: 98.4 F (36.9 C)  SpO2: 100%     FHT:N/A Lab orders placed from triage:   None

## 2022-04-15 NOTE — MAU Provider Note (Signed)
History   Chief Complaint:  HCG Level   Heidi Martin is a 21 y.o. G2X5284 at [redacted]w[redacted]d who presents for labs. Was seen on Thursday night. Had istat in the ED of 44. At the time she did not have abdominal pain or vaginal bleeding. Was told to come here for repeat labs.  Reports intermittent lower abdominal pain & vaginal cramping x 2 days. Nothing makes better or worse. Denies dysuria, vaginal bleeding, vomiting, diarrhea, vaginal discharge.  States she had negative u/a & swabs at [facility] 3 days ago.   Physical Exam   Blood pressure 114/73, pulse 80, temperature 98.4 F (36.9 C), temperature source Oral, resp. rate 18, height 5\' 7"  (1.702 m), weight 83.5 kg, last menstrual period 03/24/2022, SpO2 100 %, unknown if currently breastfeeding.  Physical Examination: General appearance - alert, well appearing, and in no distress Mental status - normal mood, behavior, speech, dress, motor activity, and thought processes Eyes - sclera anicteric Chest - normal respiratory effort  Labs: Results for orders placed or performed during the hospital encounter of 04/15/22 (from the past 24 hour(s))  hCG, quantitative, pregnancy   Collection Time: 04/15/22 12:03 PM  Result Value Ref Range   hCG, Beta Chain, Quant, S 211 (H) <5 mIU/mL    Ultrasound Studies:   US OB LESS THAN 14 WEEKS WITH OB TRANSVAGINAL  Result Date: 04/15/2022 CLINICAL DATA:  Pelvic pain in pregnant patient EXAM: OBSTETRIC <14 WK [place] AND TRANSVAGINAL OB US TECHNIQUE: Both transabdominal and transvaginal ultrasound examinations were performed for complete evaluation of the gestation as well as the maternal uterus, adnexal regions, and pelvic cul-de-sac. Transvaginal technique was performed to assess early pregnancy. COMPARISON:  None Available. FINDINGS: Intrauterine gestational sac: None Yolk sac:  Not seen Embryo:  Not seen Cardiac Activity: Not seen Subchorionic hemorrhage:  None visualized. Maternal uterus/adnexae: Uterus is  retroverted. Endometrial stripe measures up to 1.4 cm. No adnexal masses are seen. There is moderate amount of free fluid in pelvis. IMPRESSION: There is no demonstrable intrauterine gestational sac. If pregnancy test is positive, differential diagnostic possibilities would include very early IUP or failed gestation with complete abortion or ectopic gestation. Serial HCG estimations and follow-up sonogram as warranted should be considered. Uterus is retroverted. There are no dominant adnexal masses. There is moderate amount of free fluid in pelvis, possibly due to recent rupture of ovarian cyst or follicle. Electronically Signed   By: Elmer Picker M.D.   On: 04/15/2022 14:49    Assessment:   1. Pregnancy of unknown anatomic location   2. [redacted] weeks gestation of pregnancy    -Appropriate rise in HCG today. No evidence of ectopic pregnancy on ultrasound. Declines u/a or vaginal testing due to recent testing done this week at her PCP.  -Will get stat HCG on Tuesday - if continues to rise appropriately will consider outpatient viability scan in 2 weeks.     Plan: -Discharge home in stable condition -SAB vs ectopic precautions precautions discussed -Patient advised to follow-up with [facility] for scheduled lab on Tuesday -Patient may return to MAU as needed or if her condition were to change or worsen  Jorje Guild, NP 04/15/2022, 4:32 PM

## 2022-04-18 ENCOUNTER — Other Ambulatory Visit: Payer: Self-pay

## 2022-04-18 ENCOUNTER — Ambulatory Visit (INDEPENDENT_AMBULATORY_CARE_PROVIDER_SITE_OTHER): Payer: Medicaid Other | Admitting: *Deleted

## 2022-04-18 VITALS — BP 116/71 | HR 73 | Ht 67.0 in | Wt 180.4 lb

## 2022-04-18 DIAGNOSIS — O3680X0 Pregnancy with inconclusive fetal viability, not applicable or unspecified: Secondary | ICD-10-CM

## 2022-04-18 DIAGNOSIS — R110 Nausea: Secondary | ICD-10-CM

## 2022-04-18 LAB — BETA HCG QUANT (REF LAB): hCG Quant: 706 m[IU]/mL

## 2022-04-18 MED ORDER — PROMETHAZINE HCL 25 MG PO TABS: 25.0000 mg | ORAL_TABLET | Freq: Four times a day (QID) | ORAL | 0 refills | Status: DC | PRN

## 2022-04-18 NOTE — Progress Notes (Signed)
Here for stat bhcg. States having same cramping and pain=10  and is same as when she went to the hospital.No grimacing, writhing , etc noted.  Denies bleeding.  Explained we will draw stat bhcg and have her leave office. She will be called with results in a few hours after results received and reviewed by provider. Also informed her about [facility] services if needed . She voices understanding.   Tramaine Snell,RN 1210pm addendum: Results received and reviewed with Dr. Dione Plover. Recommends she start prenatal care and have viability [place] in 2 weeks. I called [facility] and reviewed results and recommendations. I recommend she start prenatal care with provider of her choice- she would like to go here again- I instructed her to call front desk to schedule on date /time that works for her. I also offered her choices of prenatal care and she wants to try centeringpregnancy. I explained once she has new ob scheduled I will schedule her centering appointments , she voices understanding. She also asked for a different nausea med stating zofran not working well. I explained I will send a RX for Phenergan for her to try and if that doesn't work, then let us know. I also explained Dr. Dione Plover recommended viability [place] and she agreed to 05/02/22 appointment. Staci Acosta

## 2022-04-30 ENCOUNTER — Inpatient Hospital Stay (HOSPITAL_COMMUNITY)
Admission: AD | Admit: 2022-04-30 | Discharge: 2022-05-01 | Disposition: A | Discharge status: Home / Self Care | Payer: Medicaid Other | Attending: Obstetrics & Gynecology | Admitting: Obstetrics & Gynecology

## 2022-04-30 DIAGNOSIS — G43909 Migraine, unspecified, not intractable, without status migrainosus: Secondary | ICD-10-CM | POA: Insufficient documentation

## 2022-04-30 DIAGNOSIS — Z3A01 Less than 8 weeks gestation of pregnancy: Secondary | ICD-10-CM | POA: Insufficient documentation

## 2022-04-30 DIAGNOSIS — O26891 Other specified pregnancy related conditions, first trimester: Secondary | ICD-10-CM | POA: Insufficient documentation

## 2022-04-30 DIAGNOSIS — O99351 Diseases of the nervous system complicating pregnancy, first trimester: Secondary | ICD-10-CM | POA: Insufficient documentation

## 2022-04-30 DIAGNOSIS — O26859 Spotting complicating pregnancy, unspecified trimester: Secondary | ICD-10-CM

## 2022-04-30 DIAGNOSIS — Z8616 Personal history of COVID-19: Secondary | ICD-10-CM | POA: Insufficient documentation

## 2022-04-30 DIAGNOSIS — R519 Headache, unspecified: Secondary | ICD-10-CM

## 2022-04-30 DIAGNOSIS — R1030 Lower abdominal pain, unspecified: Secondary | ICD-10-CM | POA: Insufficient documentation

## 2022-04-30 DIAGNOSIS — O26851 Spotting complicating pregnancy, first trimester: Secondary | ICD-10-CM | POA: Insufficient documentation

## 2022-04-30 LAB — URINALYSIS, ROUTINE W REFLEX MICROSCOPIC
Bilirubin Urine: NEGATIVE
Glucose, UA: NEGATIVE mg/dL
Hgb urine dipstick: NEGATIVE
Ketones, ur: NEGATIVE mg/dL
Nitrite: NEGATIVE
Protein, ur: NEGATIVE mg/dL
Specific Gravity, Urine: 1.024 (ref 1.005–1.030)
pH: 5 (ref 5.0–8.0)

## 2022-04-30 NOTE — MAU Note (Signed)
.  Heidi Martin is a 21 y.o. at [redacted]w[redacted]d here in MAU reporting: lower ABD cramping, pressure in her buttocks, and HA that started last Monday. Pt states she took Tylenol 1000mg  around 1900, no relief. Pt states notices some bright red spotting this am, no VB since. Pt denies LOF or tissue lost or clotting.    Onset of complaint: Monday  Pain score: 8/10 ABD, HA Vitals:   04/30/22 2207  BP: 112/67  Resp: 18  Temp: 98 F (36.7 C)  SpO2: 100%      Lab orders placed from triage:  ua

## 2022-05-01 ENCOUNTER — Inpatient Hospital Stay (HOSPITAL_COMMUNITY): Payer: Medicaid Other

## 2022-05-01 ENCOUNTER — Ambulatory Visit: Payer: Self-pay | Admitting: *Deleted

## 2022-05-01 DIAGNOSIS — O99351 Diseases of the nervous system complicating pregnancy, first trimester: Secondary | ICD-10-CM | POA: Diagnosis not present

## 2022-05-01 DIAGNOSIS — Z3A01 Less than 8 weeks gestation of pregnancy: Secondary | ICD-10-CM | POA: Diagnosis not present

## 2022-05-01 DIAGNOSIS — O26859 Spotting complicating pregnancy, unspecified trimester: Secondary | ICD-10-CM | POA: Diagnosis not present

## 2022-05-01 DIAGNOSIS — R519 Headache, unspecified: Secondary | ICD-10-CM

## 2022-05-01 DIAGNOSIS — G43909 Migraine, unspecified, not intractable, without status migrainosus: Secondary | ICD-10-CM | POA: Diagnosis not present

## 2022-05-01 DIAGNOSIS — R1030 Lower abdominal pain, unspecified: Secondary | ICD-10-CM | POA: Diagnosis not present

## 2022-05-01 DIAGNOSIS — Z8616 Personal history of COVID-19: Secondary | ICD-10-CM | POA: Diagnosis not present

## 2022-05-01 DIAGNOSIS — O26891 Other specified pregnancy related conditions, first trimester: Secondary | ICD-10-CM | POA: Diagnosis not present

## 2022-05-01 DIAGNOSIS — O26851 Spotting complicating pregnancy, first trimester: Secondary | ICD-10-CM | POA: Diagnosis not present

## 2022-05-01 LAB — HCG, QUANTITATIVE, PREGNANCY: hCG, Beta Chain, Quant, S: 55616 m[IU]/mL — ABNORMAL HIGH (ref <5)

## 2022-05-01 NOTE — MAU Provider Note (Signed)
History     CSN: [id number]  Arrival date and time: 04/30/22 2123   None     Chief Complaint  Patient presents with   Abdominal Pain   Headache   Patient is a 21 year old G4 P1-0-2-1 presenting for abdominal pain and spotting.  Reports that she is also having headache.  Headache is a chronic headache which she describes as a migraine.  She has taken Tylenol with little relief.  The pain in her abdomen and back also respond poorly to Tylenol.  Denies any visual changes, right upper quadrant pain, worsening headache, change in urination, swelling in lower extremities.    OB History     Gravida  4   Para  1   Term  1   Preterm      AB  2   Living  1      SAB  1   IAB  1   Ectopic      Multiple  0   Live Births  1           Past Medical History:  Diagnosis Date   Alpha thalassemia silent carrier    COVID-19    Irritable bowel syndrome (IBS)    Sickle cell trait ([place])     Past Surgical History:  Procedure Laterality Date   DILATION AND EVACUATION N/A 10/27/2021   Procedure: DILATATION AND EVACUATION;  Surgeon: Aletha Halim, MD;  Location: [facility];  Service: Gynecology;  Laterality: N/A;   INDUCED ABORTION     NO PAST SURGERIES      Family History  Problem Relation Age of Onset   Cancer Mother    Depression Mother    Miscarriages / Stillbirths Mother    Hypertension Mother    Hypertension Father    Cancer Maternal Grandmother    Diabetes Maternal Grandmother    Miscarriages / Stillbirths Maternal Grandmother     Social History   Tobacco Use   Smoking status: Never   Smokeless tobacco: Never  Vaping Use   Vaping Use: Never used  Substance Use Topics   Alcohol use: No   Drug use: No    Allergies:  Allergies  Allergen Reactions   Milk [Milk (Cow)] Nausea And Vomiting    Stomach cramping    Medications Prior to Admission  Medication Sig Dispense Refill Last Dose   cyclobenzaprine (FLEXERIL) 10 MG tablet Take 1 tablet (10 mg  total) by mouth 2 (two) times daily as needed for muscle spasms. 20 tablet 0    docusate sodium (COLACE) 100 MG capsule Take 1 capsule (100 mg total) by mouth daily. Can increase to twice per day for constipation 30 capsule 1    ferrous sulfate (FERROUSUL) 325 (65 FE) MG tablet Take 1 tablet (325 mg total) by mouth every other day. 15 tablet 0    metroNIDAZOLE (FLAGYL) 500 MG tablet Take 1 tablet (500 mg total) by mouth 2 (two) times daily. 14 tablet 0    ondansetron (ZOFRAN-ODT) 4 MG disintegrating tablet Take 1 tablet (4 mg total) by mouth every 6 (six) hours as needed for nausea. 20 tablet 0    oxyCODONE-acetaminophen (PERCOCET/ROXICET) 5-325 MG tablet Take 1 tablet by mouth every 6 (six) hours as needed. 4 tablet 0    prenatal vitamin w/FE, FA (NATACHEW) 29-1 MG CHEW chewable tablet Chew 1 tablet by mouth daily at 12 noon. 30 tablet 12    promethazine (PHENERGAN) 25 MG tablet Take 1 tablet (25 mg total) by mouth every  6 (six) hours as needed for nausea or vomiting. 30 tablet 0     Review of Systems  Constitutional:  Negative for chills and fever.  Eyes:  Negative for visual disturbance.  Respiratory:  Negative for shortness of breath.   Cardiovascular:  Negative for chest pain.  Gastrointestinal:  Positive for abdominal pain and constipation.  Endocrine: Negative for polyuria.  Genitourinary:  Positive for vaginal bleeding. Negative for dysuria and vaginal discharge.  Musculoskeletal:  Positive for back pain (Low).  Neurological:  Positive for headaches (Chronic migraine).   Physical Exam   Blood pressure 112/67, temperature 98 F (36.7 C), temperature source Oral, resp. rate 18, height 5\' 7"  (1.702 m), weight 83.1 kg, last menstrual period 03/24/2022, SpO2 100 %, unknown if currently breastfeeding.  Physical Exam Vitals reviewed.  Constitutional:      Appearance: She is well-developed.  HENT:     Head: Normocephalic and atraumatic.     Mouth/Throat:     Mouth: Mucous membranes  are moist.  Eyes:     Extraocular Movements: Extraocular movements intact.  Cardiovascular:     Rate and Rhythm: Normal rate and regular rhythm.     Heart sounds: No murmur heard.    No friction rub.  Pulmonary:     Effort: Pulmonary effort is normal.     Breath sounds: Normal breath sounds.  Abdominal:     General: Bowel sounds are normal. There is no distension or abdominal bruit.     Tenderness: There is no abdominal tenderness. There is no right CVA tenderness or left CVA tenderness.     Comments: gravid  Skin:    General: Skin is warm.     Capillary Refill: Capillary refill takes less than 2 seconds.     Coloration: Skin is not cyanotic.  Neurological:     General: No focal deficit present.     Mental Status: She is alert.  Psychiatric:        Mood and Affect: Mood normal.     MAU Course  MDM Ultrasound Urinalysis with micro   Assessment and Plan  Patient presenting with chronic migraine headache as well as lower abdominal pain and vaginal spotting this morning.  Reports that the spotting is just continued.  Ultrasound showing 6-week 3-day pregnancy.  Urinalysis showing trace leukocytes and rare bacteria but otherwise within normal limits.  Discussed strict return precautions and provided patient with list of pregnancy safe medications.  Patient discharged home with significant other.  03/26/2022 Maven Rosander 05/01/2022, 1:20 AM

## 2022-05-01 NOTE — Telephone Encounter (Signed)
Reason for Disposition . [1] Numbness (i.e., loss of sensation) of the face, arm / hand, or leg / foot on one side of the body AND [2] sudden onset AND [3] brief (now gone)  Answer Assessment - Initial Assessment Questions 1. SYMPTOM: "What is the main symptom you are concerned about?" (e.g., weakness, numbness)     Numbness on right side 2. ONSET: "When did this start?" (minutes, hours, days; while sleeping)     yesterday 3. LAST NORMAL: "When was the last time you (the patient) were normal (no symptoms)?"     Yesterday evening after left ED 4. PATTERN "Does this come and go, or has it been constant since it started?"  "Is it present now?"     Leg consistently numb, arm and face come and go 5. CARDIAC SYMPTOMS: "Have you had any of the following symptoms: chest pain, difficulty breathing, palpitations?"     Heart pounding fast and different rhythms 6. NEUROLOGIC SYMPTOMS: "Have you had any of the following symptoms: headache, dizziness, vision loss, double vision, changes in speech, unsteady on your feet?"     Dizzy 7. OTHER SYMPTOMS: "Do you have any other symptoms?"      8. PREGNANCY: "Is there any chance you are pregnant?" "When was your last menstrual period?"     [redacted] weeks pregnant.  Protocols used: Neurologic Deficit-A-AH

## 2022-05-01 NOTE — Discharge Instructions (Signed)
It was wonderful seeing you today!  I am sorry you are having this headache and abdominal pain.  Regarding the headache you can take Excedrin Migraine.  Below is a list of the medications that are safe to take during pregnancy.  If you notice your headache gets worse, you start seeing changes in your vision, right upper quadrant pain, you have any more vaginal bleeding when she reevaluated.  I hope you have a wonderful night!  Safe Medications in Pregnancy   Acne:  Benzoyl Peroxide  Salicylic Acid   Backache/Headache:  Tylenol: 2 regular strength every 4 hours OR               2 Extra strength every 6 hours   Colds/Coughs/Allergies:  Benadryl (alcohol free) 25 mg every 6 hours as needed  Breath right strips  Claritin  Cepacol throat lozenges  Chloraseptic throat spray  Cold-Eeze- up to three times per day  Cough drops, alcohol free  Flonase (by prescription only)  Guaifenesin  Mucinex  Robitussin DM (plain only, alcohol free)  Saline nasal spray/drops  Sudafed (pseudoephedrine) & Actifed * use only after [redacted] weeks gestation and if you do not have high blood pressure  Tylenol  Vicks Vaporub  Zinc lozenges  Zyrtec   Constipation:  Colace  Ducolax suppositories  Fleet enema  Glycerin suppositories  Metamucil  Milk of magnesia  Miralax  Senokot  Smooth move tea   Diarrhea:  Kaopectate  Imodium A-D   *NO pepto Bismol   Hemorrhoids:  Anusol  Anusol HC  Preparation H  Tucks   Indigestion:  Tums  Maalox  Mylanta  Zantac  Pepcid   Insomnia:  Benadryl (alcohol free) 25mg  every 6 hours as needed  Tylenol PM  Unisom, no Gelcaps   Leg Cramps:  Tums  MagGel   Nausea/Vomiting:  Bonine  Dramamine  Emetrol  Ginger extract  Sea bands  Meclizine  Nausea medication to take during pregnancy:  Unisom (doxylamine succinate 25 mg tablets) Take one tablet daily at bedtime. If symptoms are not adequately controlled, the dose can be increased to a maximum recommended  dose of two tablets daily (1/2 tablet in the morning, 1/2 tablet mid-afternoon and one at bedtime).  Vitamin B6 100mg  tablets. Take one tablet twice a day (up to 200 mg per day).   Skin Rashes:  Aveeno products  Benadryl cream or 25mg  every 6 hours as needed  Calamine Lotion  1% cortisone cream   Yeast infection:  Gyne-lotrimin 7  Monistat 7    **If taking multiple medications, please check labels to avoid duplicating the same active ingredients  **take medication as directed on the label  ** Do not exceed 4000 mg of tylenol in 24 hours  **Do not take medications that contain aspirin or ibuprofen

## 2022-05-02 ENCOUNTER — Ambulatory Visit: Payer: Medicaid Other

## 2022-05-02 ENCOUNTER — Ambulatory Visit (INDEPENDENT_AMBULATORY_CARE_PROVIDER_SITE_OTHER): Payer: Medicaid Other | Admitting: Advanced Practice Midwife

## 2022-05-02 DIAGNOSIS — R110 Nausea: Secondary | ICD-10-CM | POA: Diagnosis not present

## 2022-05-02 DIAGNOSIS — Z3490 Encounter for supervision of normal pregnancy, unspecified, unspecified trimester: Secondary | ICD-10-CM | POA: Diagnosis not present

## 2022-05-02 DIAGNOSIS — Z3A01 Less than 8 weeks gestation of pregnancy: Secondary | ICD-10-CM

## 2022-05-02 DIAGNOSIS — O3680X0 Pregnancy with inconclusive fetal viability, not applicable or unspecified: Secondary | ICD-10-CM

## 2022-05-02 DIAGNOSIS — O219 Vomiting of pregnancy, unspecified: Secondary | ICD-10-CM | POA: Diagnosis not present

## 2022-05-02 DIAGNOSIS — K117 Disturbances of salivary secretion: Secondary | ICD-10-CM

## 2022-05-02 MED ORDER — DOXYLAMINE-PYRIDOXINE 10-10 MG PO TBEC: 1.0000 | DELAYED_RELEASE_TABLET | Freq: Three times a day (TID) | ORAL | 3 refills | Status: DC | PRN

## 2022-05-02 MED ORDER — CONCEPT DHA 53.5-38-1 MG PO CAPS: 1.0000 | ORAL_CAPSULE | Freq: Every day | ORAL | 12 refills | Status: DC

## 2022-05-02 MED ORDER — PROMETHAZINE HCL 25 MG PO TABS: 25.0000 mg | ORAL_TABLET | Freq: Four times a day (QID) | ORAL | 3 refills | Status: DC | PRN

## 2022-05-02 NOTE — Progress Notes (Signed)
Ultrasounds Results Note  SUBJECTIVE HPI:  [name] is a 21 y.o. [id number] at [redacted]w[redacted]d by  6.5 week [place] on 05/01/22 who presents to [facility] for followup ultrasound results but had [place] yesterday showing live SIUP. The patient denies/reports abdominal pain or vaginal bleeding. Is experiencing significant N/V of pregnancy. Ran out of Phenergan. Zofran not very helpful.  --/--/O POS (04/27 1128)  Repeat ultrasound was performed earlier today.   Past Medical History:  Diagnosis Date   Alpha thalassemia silent carrier    COVID-19    Irritable bowel syndrome (IBS)    Sickle cell trait (HCC)    Past Surgical History:  Procedure Laterality Date   DILATION AND EVACUATION N/A 10/27/2021   Procedure: DILATATION AND EVACUATION;  Surgeon: [place] Bing, MD;  Location: [facility] OR;  Service: Gynecology;  Laterality: N/A;   INDUCED ABORTION     NO PAST SURGERIES     Social History   Socioeconomic History   Marital status: Single    Spouse name: Not on file   Number of children: Not on file   Years of education: Not on file   Highest education level: Not on file  Occupational History   Not on file  Tobacco Use   Smoking status: Never   Smokeless tobacco: Never  Vaping Use   Vaping Use: Never used  Substance and Sexual Activity   Alcohol use: No   Drug use: No   Sexual activity: Not Currently    Birth control/protection: None  Other Topics Concern   Not on file  Social History Narrative   Not on file   Social Determinants of Health   Financial Resource Strain: Not on file  Food Insecurity: No Food Insecurity (01/05/2021)   Hunger Vital Sign    Worried About Running Out of Food in the Last Year: Never true    Ran Out of Food in the Last Year: Never true  Transportation Needs: No Transportation Needs (01/05/2021)   PRAPARE - Administrator, Civil Service (Medical): No    Lack of Transportation (Non-Medical): No  Recent Concern: Transportation Needs - Unmet  Transportation Needs (12/06/2020)   PRAPARE - Administrator, Civil Service (Medical): Yes    Lack of Transportation (Non-Medical): Yes  Physical Activity: Not on file  Stress: Not on file  Social Connections: Not on file  Intimate Partner Violence: Not on file   Current Outpatient Medications on File Prior to Visit  Medication Sig Dispense Refill   cyclobenzaprine (FLEXERIL) 10 MG tablet Take 1 tablet (10 mg total) by mouth 2 (two) times daily as needed for muscle spasms. 20 tablet 0   docusate sodium (COLACE) 100 MG capsule Take 1 capsule (100 mg total) by mouth daily. Can increase to twice per day for constipation 30 capsule 1   ferrous sulfate (FERROUSUL) 325 (65 FE) MG tablet Take 1 tablet (325 mg total) by mouth every other day. 15 tablet 0   metroNIDAZOLE (FLAGYL) 500 MG tablet Take 1 tablet (500 mg total) by mouth 2 (two) times daily. 14 tablet 0   ondansetron (ZOFRAN-ODT) 4 MG disintegrating tablet Take 1 tablet (4 mg total) by mouth every 6 (six) hours as needed for nausea. 20 tablet 0   oxyCODONE-acetaminophen (PERCOCET/ROXICET) 5-325 MG tablet Take 1 tablet by mouth every 6 (six) hours as needed. 4 tablet 0   promethazine (PHENERGAN) 25 MG tablet Take 1 tablet (25 mg total) by mouth every 6 (six)  hours as needed for nausea or vomiting. 30 tablet 0   [DISCONTINUED] dicyclomine (BENTYL) 20 MG tablet Take 1 tablet (20 mg total) by mouth 2 (two) times daily. (Patient not taking: No sig reported) 20 tablet 0   [DISCONTINUED] metoCLOPramide (REGLAN) 10 MG tablet Take 1 tablet (10 mg total) by mouth 3 (three) times daily between meals as needed for nausea. (Patient not taking: Reported on 02/14/2020) 30 tablet 0   No current facility-administered medications on file prior to visit.   Allergies  Allergen Reactions   Milk [Milk (Cow)] Nausea And Vomiting    Stomach cramping    I have reviewed patient's Past Medical Hx, Surgical Hx, Family Hx, Social Hx, medications and  allergies.   Review of Systems Review of Systems  Constitutional: Negative for fever and chills.  Gastrointestinal: Negative for abdominal pain.  Genitourinary: Negative for vaginal bleeding.  Musculoskeletal: Negative for back pain.  Neurological: Negative for dizziness and weakness.    Physical Exam  LMP 03/24/2022 (Exact Date)   Patient's last menstrual period was 03/24/2022 (exact date). GENERAL: Well-developed, well-nourished female in no acute distress.  HEENT: Normocephalic, atraumatic.   LUNGS: Effort normal ABDOMEN: Deferred HEART: Regular rate  SKIN: Warm, dry and without erythema PSYCH: Normal mood and affect NEURO: Alert and oriented x 4  LAB RESULTS No results found for this or any previous visit (from the past 24 hour(s)).  IMAGING US OB Transvaginal  Result Date: 05/01/2022 CLINICAL DATA:  Bleeding and cramping. EXAM: TRANSVAGINAL OB ULTRASOUND TECHNIQUE: Transvaginal ultrasound was performed for complete evaluation of the gestation as well as the maternal uterus, adnexal regions, and pelvic cul-de-sac. COMPARISON:  04/15/2022. FINDINGS: Intrauterine gestational sac: Single Yolk sac:  Yes Embryo:  Yes Cardiac Activity: Yes Heart Rate: 112 bpm MSD:   mm    w     d CRL:   7.6 mm   6 w 5 d                  [place] EDC: 12/20/2022 Subchorionic hemorrhage:  None visualized. Maternal uterus/adnexae: Retroverted uterus. Ovaries are within normal limits. Small amount of free fluid is noted in the right adnexa common decreased from the prior exam. IMPRESSION: 1. Single live intrauterine pregnancy with estimated gestational age of [redacted] weeks 5 days. EDC: 12/20/2022. 2. Small amount of free fluid in the right adnexa, decreased from the prior exam. Electronically Signed   By: Thornell Sartorius M.D.   On: 05/01/2022 01:16   US OB LESS THAN 14 WEEKS WITH OB TRANSVAGINAL  Result Date: 04/15/2022 CLINICAL DATA:  Pelvic pain in pregnant patient EXAM: OBSTETRIC <14 WK [place] AND TRANSVAGINAL OB US  TECHNIQUE: Both transabdominal and transvaginal ultrasound examinations were performed for complete evaluation of the gestation as well as the maternal uterus, adnexal regions, and pelvic cul-de-sac. Transvaginal technique was performed to assess early pregnancy. COMPARISON:  None Available. FINDINGS: Intrauterine gestational sac: None Yolk sac:  Not seen Embryo:  Not seen Cardiac Activity: Not seen Subchorionic hemorrhage:  None visualized. Maternal uterus/adnexae: Uterus is retroverted. Endometrial stripe measures up to 1.4 cm. No adnexal masses are seen. There is moderate amount of free fluid in pelvis. IMPRESSION: There is no demonstrable intrauterine gestational sac. If pregnancy test is positive, differential diagnostic possibilities would include very early IUP or failed gestation with complete abortion or ectopic gestation. Serial HCG estimations and follow-up sonogram as warranted should be considered. Uterus is retroverted. There are no dominant adnexal masses. There is moderate amount of  free fluid in pelvis, possibly due to recent rupture of ovarian cyst or follicle. Electronically Signed   By: Elmer Picker M.D.   On: 04/15/2022 14:49    ASSESSMENT 1. [redacted] weeks gestation of pregnancy   2. Nausea and vomiting during pregnancy   3. Nausea   4. Intrauterine pregnancy   5. Ptyalism     PLAN Discussed taking Diclegis on schedule.Rx Phenergen with refills. May need to take scheduled Phenergan and Zofran for exacerbations.  Start prenatal care with provider of your choice List of Ob/Gyn providers given. Patient advised to start/continue taking prenatal vitamins Go to MAU as needed for heavy bleeding, abdominal pain or fever greater than 100.4. Allergies as of 05/02/2022       Reactions   Milk [milk (cow)] Nausea And Vomiting   Stomach cramping        Medication List        Accurate as of May 02, 2022 11:59 PM. If you have any questions, ask your nurse or doctor.           STOP taking these medications    prenatal vitamin w/FE, FA 29-1 MG Chew chewable tablet Stopped by: Manya Silvas, CNM       TAKE these medications    Concept DHA 53.5-38-1 MG Caps Take 1 tablet by mouth daily. Started by: Manya Silvas, CNM   cyclobenzaprine 10 MG tablet Commonly known as: FLEXERIL Take 1 tablet (10 mg total) by mouth 2 (two) times daily as needed for muscle spasms.   docusate sodium 100 MG capsule Commonly known as: COLACE Take 1 capsule (100 mg total) by mouth daily. Can increase to twice per day for constipation   Doxylamine-Pyridoxine 10-10 MG Tbec Take 1-2 tablets by mouth 3 (three) times daily as needed (nausea). Start with 2 tablets every evening. If symptoms persist, add 1 tablet every morning. If symptoms persist, add 1 tablet at mid-day. Started by: Manya Silvas, CNM   ferrous sulfate 325 (65 FE) MG tablet Commonly known as: FerrouSul Take 1 tablet (325 mg total) by mouth every other day.   metroNIDAZOLE 500 MG tablet Commonly known as: FLAGYL Take 1 tablet (500 mg total) by mouth 2 (two) times daily.   ondansetron 4 MG disintegrating tablet Commonly known as: ZOFRAN-ODT Take 1 tablet (4 mg total) by mouth every 6 (six) hours as needed for nausea.   oxyCODONE-acetaminophen 5-325 MG tablet Commonly known as: PERCOCET/ROXICET Take 1 tablet by mouth every 6 (six) hours as needed.   promethazine 25 MG tablet Commonly known as: PHENERGAN Take 1 tablet (25 mg total) by mouth every 6 (six) hours as needed for nausea or vomiting. What changed: Another medication with the same name was added. Make sure you understand how and when to take each. Changed by: Manya Silvas, CNM   promethazine 25 MG tablet Commonly known as: PHENERGAN Take 1 tablet (25 mg total) by mouth every 6 (six) hours as needed for nausea or vomiting. What changed: You were already taking a medication with the same name, and this prescription was added. Make sure you  understand how and when to take each. Changed by: Manya Silvas, [place], CNM 05/02/2022 11:45 AM

## 2022-05-02 NOTE — Patient Instructions (Addendum)
We are changing your due date to 12/19/21 based on Ultrasound 05/01/22.    CenteringPregnancy is a model of prenatal care that started 30 years ago and is used in about 600 practices around the [place]. You meet with a group of 8-12 women due around the same time as you. In Centering you will have individual time with the provider and meet as a group. There's much more time for discussion and learning. You will actually have much more time with your provider in Centering than in traditional prenatal care.? You will come directly into the Centering room and will not wait in the lobby so there is no wasted time. You will have 2-hour visits every 4 weeks then every 2 weeks. You will know your Centering prenatal appointments in advance. In your last month of pregnancy, you may also come in for some individual visits. Additional appointments can be scheduled if you need more care. Studies have shown that CenteringPregnancy improves birth outcomes. We have seen especially big improvements in fewer Black women delivering babies who are too small or born too early. Visit the website [facility] for more information. Let your provider or clinic staff know if you want to sign up.

## 2022-05-04 ENCOUNTER — Encounter: Payer: Self-pay | Admitting: Advanced Practice Midwife

## 2022-05-08 ENCOUNTER — Telehealth: Payer: Self-pay | Admitting: Family Medicine

## 2022-05-08 DIAGNOSIS — O219 Vomiting of pregnancy, unspecified: Secondary | ICD-10-CM | POA: Insufficient documentation

## 2022-05-08 NOTE — Telephone Encounter (Signed)
Patient called in saying she is experiencing extreme pressure on her bottom to where she is unable to sit down and having cramps in her lower stomach.

## 2022-05-08 NOTE — Telephone Encounter (Signed)
Called pt and the call did not connect. Will try again tomorrow.

## 2022-05-11 ENCOUNTER — Encounter: Payer: Self-pay | Admitting: General Practice

## 2022-05-11 NOTE — Telephone Encounter (Signed)
Called patient, no answer-  will send mychart message for follow up.

## 2022-05-16 ENCOUNTER — Other Ambulatory Visit: Payer: Self-pay

## 2022-05-16 ENCOUNTER — Encounter (HOSPITAL_COMMUNITY): Payer: Self-pay | Admitting: Obstetrics and Gynecology

## 2022-05-16 ENCOUNTER — Inpatient Hospital Stay (HOSPITAL_COMMUNITY)
Admission: AD | Admit: 2022-05-16 | Discharge: 2022-05-16 | Disposition: A | Discharge status: Home / Self Care | Payer: Medicaid Other | Attending: Obstetrics and Gynecology | Admitting: Obstetrics and Gynecology

## 2022-05-16 DIAGNOSIS — O99011 Anemia complicating pregnancy, first trimester: Secondary | ICD-10-CM | POA: Insufficient documentation

## 2022-05-16 DIAGNOSIS — O99611 Diseases of the digestive system complicating pregnancy, first trimester: Secondary | ICD-10-CM | POA: Diagnosis not present

## 2022-05-16 DIAGNOSIS — R1030 Lower abdominal pain, unspecified: Secondary | ICD-10-CM | POA: Insufficient documentation

## 2022-05-16 DIAGNOSIS — D571 Sickle-cell disease without crisis: Secondary | ICD-10-CM | POA: Insufficient documentation

## 2022-05-16 DIAGNOSIS — Z3A08 8 weeks gestation of pregnancy: Secondary | ICD-10-CM | POA: Diagnosis not present

## 2022-05-16 DIAGNOSIS — K5900 Constipation, unspecified: Secondary | ICD-10-CM | POA: Diagnosis not present

## 2022-05-16 DIAGNOSIS — O26891 Other specified pregnancy related conditions, first trimester: Secondary | ICD-10-CM | POA: Insufficient documentation

## 2022-05-16 DIAGNOSIS — R42 Dizziness and giddiness: Secondary | ICD-10-CM | POA: Diagnosis present

## 2022-05-16 DIAGNOSIS — G8929 Other chronic pain: Secondary | ICD-10-CM | POA: Insufficient documentation

## 2022-05-16 DIAGNOSIS — K589 Irritable bowel syndrome without diarrhea: Secondary | ICD-10-CM | POA: Diagnosis not present

## 2022-05-16 LAB — URINALYSIS, ROUTINE W REFLEX MICROSCOPIC
Bilirubin Urine: NEGATIVE
Glucose, UA: NEGATIVE mg/dL
Hgb urine dipstick: NEGATIVE
Ketones, ur: NEGATIVE mg/dL
Nitrite: NEGATIVE
Protein, ur: NEGATIVE mg/dL
Specific Gravity, Urine: 1.014 (ref 1.005–1.030)
pH: 7 (ref 5.0–8.0)

## 2022-05-16 LAB — CBC
HCT: 32.9 % — ABNORMAL LOW (ref 36.0–46.0)
Hemoglobin: 11.4 g/dL — ABNORMAL LOW (ref 12.0–15.0)
MCH: 26.5 pg (ref 26.0–34.0)
MCHC: 34.7 g/dL (ref 30.0–36.0)
MCV: 76.3 fL — ABNORMAL LOW (ref 80.0–100.0)
Platelets: 266 10*3/uL (ref 150–400)
RBC: 4.31 MIL/uL (ref 3.87–5.11)
RDW: 15.9 % — ABNORMAL HIGH (ref 11.5–15.5)
WBC: 9.5 10*3/uL (ref 4.0–10.5)
nRBC: 0 % (ref 0.0–0.2)

## 2022-05-16 MED ORDER — CYCLOBENZAPRINE HCL 5 MG PO TABS
10.0000 mg | ORAL_TABLET | Freq: Once | ORAL | Status: AC
  Administered 2022-05-16: 10 mg via ORAL
  Filled 2022-05-16: qty 2

## 2022-05-16 MED ORDER — FERROUS SULFATE 325 (65 FE) MG PO TABS: 325.0000 mg | ORAL_TABLET | ORAL | 2 refills | Status: DC

## 2022-05-16 MED ORDER — DOCUSATE SODIUM 100 MG PO CAPS: 100.0000 mg | ORAL_CAPSULE | Freq: Every day | ORAL | 2 refills | Status: DC

## 2022-05-16 NOTE — MAU Note (Signed)
.  Heidi Martin is a 21 y.o. at [redacted]w[redacted]d here in MAU reporting: was at work and "head started spinning" checked BP was 138/99 and again it was 141/106. Reports constant lower abdominal cramping and pressure in her bottom. Reports pink spotting this morning, but denies current VB or abnormal discharge.   Onset of complaint: 1930 Pain score: 9 - cramping  Vitals:   05/16/22 0102  BP: 129/83  Pulse: 75  Resp: 19  Temp: 98.3 F (36.8 C)  SpO2: 100%     FHT:n/a  Lab orders placed from triage:  UA

## 2022-05-16 NOTE — MAU Provider Note (Signed)
History     CSN: [id number]  Arrival date and time: 05/16/22 0048   Event Date/Time   First Provider Initiated Contact with Patient 05/16/22 0110      Chief Complaint  Patient presents with   Dizziness   Abdominal Pain   HPI  Heidi Martin is a 21 y.o. [id number] at [redacted]w[redacted]d who presents for evaluation of abdominal pain. Patient reports this has been an ongoing issue for her since she found out she was pregnant. She reports lower abdominal cramping all along the bottom of her abdomen. Patient rates the pain as a 9/10 and has tried tylenol for the pain with no relief. She states this happens every time she is pregnant and lasts the entirety of the pregnancy. She reports she has IBS and struggles with constipation. She feels a signficant amount of rectal pressure and has not had a "good" bowel movement in weeks. She denies any vaginal bleeding, discharge, and leaking of fluid.   She also reports an episode of feeling dizzy and lightheaded this evening at work. She reports drinking "lots of water" but unable to quantify the amount. She reports eating regularly. She states she checked her BP at work and it was elevated. She denies any hx of hypertension   OB History     Gravida  4   Para  1   Term  1   Preterm      AB  2   Living  1      SAB  1   IAB  1   Ectopic      Multiple  0   Live Births  1           Past Medical History:  Diagnosis Date   Alpha thalassemia silent carrier    COVID-19    Irritable bowel syndrome (IBS)    Sickle cell trait (HCC)     Past Surgical History:  Procedure Laterality Date   DILATION AND EVACUATION N/A 10/27/2021   Procedure: DILATATION AND EVACUATION;  Surgeon: [place] Bing, MD;  Location: [facility] OR;  Service: Gynecology;  Laterality: N/A;   INDUCED ABORTION     NO PAST SURGERIES      Family History  Problem Relation Age of Onset   Cancer Mother    Depression Mother    Miscarriages / Stillbirths Mother    Hypertension  Mother    Hypertension Father    Cancer Maternal Grandmother    Diabetes Maternal Grandmother    Miscarriages / Stillbirths Maternal Grandmother     Social History   Tobacco Use   Smoking status: Never   Smokeless tobacco: Never  Vaping Use   Vaping Use: Never used  Substance Use Topics   Alcohol use: No   Drug use: No    Allergies:  Allergies  Allergen Reactions   Milk [Milk (Cow)] Nausea And Vomiting    Stomach cramping    Medications Prior to Admission  Medication Sig Dispense Refill Last Dose   Doxylamine-Pyridoxine 10-10 MG TBEC Take 1-2 tablets by mouth 3 (three) times daily as needed (nausea). Start with 2 tablets every evening. If symptoms persist, add 1 tablet every morning. If symptoms persist, add 1 tablet at mid-day. 60 tablet 3 05/15/2022   Prenat-FeFum-FePo-FA-Omega 3 (CONCEPT DHA) 53.5-38-1 MG CAPS Take 1 tablet by mouth daily. 30 capsule 12 05/15/2022   promethazine (PHENERGAN) 25 MG tablet Take 1 tablet (25 mg total) by mouth every 6 (six) hours as needed for nausea or vomiting.  30 tablet 3 05/15/2022   cyclobenzaprine (FLEXERIL) 10 MG tablet Take 1 tablet (10 mg total) by mouth 2 (two) times daily as needed for muscle spasms. 20 tablet 0    docusate sodium (COLACE) 100 MG capsule Take 1 capsule (100 mg total) by mouth daily. Can increase to twice per day for constipation 30 capsule 1    ferrous sulfate (FERROUSUL) 325 (65 FE) MG tablet Take 1 tablet (325 mg total) by mouth every other day. 15 tablet 0    metroNIDAZOLE (FLAGYL) 500 MG tablet Take 1 tablet (500 mg total) by mouth 2 (two) times daily. 14 tablet 0    ondansetron (ZOFRAN-ODT) 4 MG disintegrating tablet Take 1 tablet (4 mg total) by mouth every 6 (six) hours as needed for nausea. 20 tablet 0    oxyCODONE-acetaminophen (PERCOCET/ROXICET) 5-325 MG tablet Take 1 tablet by mouth every 6 (six) hours as needed. 4 tablet 0    promethazine (PHENERGAN) 25 MG tablet Take 1 tablet (25 mg total) by mouth every 6  (six) hours as needed for nausea or vomiting. 30 tablet 0     Review of Systems  Constitutional: Negative.  Negative for fatigue and fever.  HENT: Negative.    Respiratory: Negative.  Negative for shortness of breath.   Cardiovascular: Negative.  Negative for chest pain.  Gastrointestinal:  Positive for abdominal pain and constipation. Negative for diarrhea, nausea and vomiting.  Genitourinary: Negative.  Negative for dysuria.  Neurological:  Positive for dizziness and light-headedness. Negative for headaches.   Physical Exam   Blood pressure 129/83, pulse 75, temperature 98.3 F (36.8 C), temperature source Oral, resp. rate 19, height 5\' 7"  (1.702 m), weight 83 kg, last menstrual period 03/24/2022, SpO2 100 %, unknown if currently breastfeeding.  Patient Vitals for the past 24 hrs:  BP Temp Temp src Pulse Resp SpO2 Height Weight  05/16/22 0102 129/83 98.3 F (36.8 C) Oral 75 19 100 % 5\' 7"  (1.702 m) 83 kg    Physical Exam Vitals and nursing note reviewed.  Constitutional:      General: She is not in acute distress.    Appearance: She is well-developed.  HENT:     Head: Normocephalic.  Eyes:     Pupils: Pupils are equal, round, and reactive to light.  Cardiovascular:     Rate and Rhythm: Normal rate and regular rhythm.     Heart sounds: Normal heart sounds.  Pulmonary:     Effort: Pulmonary effort is normal. No respiratory distress.     Breath sounds: Normal breath sounds.  Abdominal:     General: Bowel sounds are normal. There is no distension.     Palpations: Abdomen is soft.     Tenderness: There is abdominal tenderness in the suprapubic area.  Skin:    General: Skin is warm and dry.  Neurological:     Mental Status: She is alert and oriented to person, place, and time.  Psychiatric:        Mood and Affect: Mood normal.        Behavior: Behavior normal.        Thought Content: Thought content normal.        Judgment: Judgment normal.     MAU Course   Procedures  Results for orders placed or performed during the hospital encounter of 05/16/22 (from the past 24 hour(s))  Urinalysis, Routine w reflex microscopic     Status: Abnormal   Collection Time: 05/16/22  1:05 AM  Result Value Ref  Range   Color, Urine YELLOW YELLOW   APPearance HAZY (A) CLEAR   Specific Gravity, Urine 1.014 1.005 - 1.030   pH 7.0 5.0 - 8.0   Glucose, UA NEGATIVE NEGATIVE mg/dL   Hgb urine dipstick NEGATIVE NEGATIVE   Bilirubin Urine NEGATIVE NEGATIVE   Ketones, ur NEGATIVE NEGATIVE mg/dL   Protein, ur NEGATIVE NEGATIVE mg/dL   Nitrite NEGATIVE NEGATIVE   Leukocytes,Ua SMALL (A) NEGATIVE   RBC / HPF 0-5 0 - 5 RBC/hpf   WBC, UA 0-5 0 - 5 WBC/hpf   Bacteria, UA RARE (A) NONE SEEN   Squamous Epithelial / LPF 11-20 0 - 5  CBC     Status: Abnormal   Collection Time: 05/16/22  1:35 AM  Result Value Ref Range   WBC 9.5 4.0 - 10.5 K/uL   RBC 4.31 3.87 - 5.11 MIL/uL   Hemoglobin 11.4 (L) 12.0 - 15.0 g/dL   HCT 30.1 (L) 60.1 - 09.3 %   MCV 76.3 (L) 80.0 - 100.0 fL   MCH 26.5 26.0 - 34.0 pg   MCHC 34.7 30.0 - 36.0 g/dL   RDW 23.5 (H) 57.3 - 22.0 %   Platelets 266 150 - 400 K/uL   nRBC 0.0 0.0 - 0.2 %     MDM Labs ordered and reviewed.   UA, UC CBC Soap Suds Enema Flexeril PO  Pt informed that the ultrasound is considered a limited OB ultrasound and is not intended to be a complete ultrasound exam.  Patient also informed that the ultrasound is not being completed with the intent of assessing for fetal or placental anomalies or any pelvic abnormalities.  Explained that the purpose of today's ultrasound is to assess for  viability.  Patient acknowledges the purpose of the exam and the limitations of the study.    Live IUP with FHR 169 bpm.   Patient reported pain was at site where ultrasound was being performed. Discussed this is likely normal stretching and growing of uterus.   Low suspicion for appendicitis due to location of pain and absence of fever,  leukocytosis and GI complaints.   Patient reports pain is now more intermittent than consistent. Reports good bowel movement after enema.   Assessment and Plan   1. Constipation during pregnancy in first trimester   2. [redacted] weeks gestation of pregnancy   3. Anemia affecting pregnancy in first trimester   4. Chronic pelvic pain in female     -Discharge home in stable condition -Rx for colace, iron supplement sent to pharmacy -First trimester precautions discussed -Patient advised to follow-up with OB as scheduled for prenatal care -Patient may return to MAU as needed or if her condition were to change or worsen  Rolm Bookbinder, CNM 05/16/2022, 1:11 AM

## 2022-05-16 NOTE — Discharge Instructions (Signed)

## 2022-05-17 LAB — CULTURE, OB URINE

## 2022-05-20 ENCOUNTER — Encounter: Payer: Self-pay | Admitting: Advanced Practice Midwife

## 2022-05-21 ENCOUNTER — Telehealth: Payer: Medicaid Other | Admitting: Family

## 2022-05-21 DIAGNOSIS — R519 Headache, unspecified: Secondary | ICD-10-CM | POA: Diagnosis not present

## 2022-05-21 MED ORDER — NAPROXEN 500 MG PO TABS: 500.0000 mg | ORAL_TABLET | Freq: Two times a day (BID) | ORAL | 0 refills | Status: DC

## 2022-05-21 NOTE — Progress Notes (Signed)
Virtual Visit Consent   Heidi Martin, you are scheduled for a virtual visit with a [place] provider today. Just as with appointments in the office, your consent must be obtained to participate. Your consent will be active for this visit and any virtual visit you may have with one of our providers in the next 365 days. If you have a MyChart account, a copy of this consent can be sent to you electronically.  As this is a virtual visit, video technology does not allow for your provider to perform a traditional examination. This may limit your provider's ability to fully assess your condition. If your provider identifies any concerns that need to be evaluated in person or the need to arrange testing (such as labs, EKG, etc.), we will make arrangements to do so. Although advances in technology are sophisticated, we cannot ensure that it will always work on either your end or our end. If the connection with a video visit is poor, the visit may have to be switched to a telephone visit. With either a video or telephone visit, we are not always able to ensure that we have a secure connection.  By engaging in this virtual visit, you consent to the provision of healthcare and authorize for your insurance to be billed (if applicable) for the services provided during this visit. Depending on your insurance coverage, you may receive a charge related to this service.  I need to obtain your verbal consent now. Are you willing to proceed with your visit today? Heidi Martin has provided verbal consent on 05/21/2022 for a virtual visit (video or telephone). Jannifer Rodney, FNP  Date: 05/21/2022 1:34 PM  Virtual Visit via Video Note   I, Jannifer Rodney, connected with  Heidi Martin  ([id number], 21-14-2002) on 05/21/22 at  1:30 PM EST by a video-enabled telemedicine application and verified that I am speaking with the correct person using two identifiers.  Location: Patient: Virtual Visit Location Patient:  Home Provider: Virtual Visit Location Provider: Home Office   I discussed the limitations of evaluation and management by telemedicine and the availability of in person appointments. The patient expressed understanding and agreed to proceed.    History of Present Illness: Heidi Martin is a 21 y.o. who identifies as a female who was assigned female at birth, and is being seen today for headache.  HPI: Headache  This is a new problem. The current episode started more than 1 year ago. The problem occurs constantly. The problem has been unchanged. The pain is located in the Bilateral region. The pain quality is similar to prior headaches. The quality of the pain is described as aching. The pain is at a severity of 10/10. The pain is moderate. Associated symptoms include ear pain and nausea. Pertinent negatives include no anorexia, back pain, blurred vision, eye pain, phonophobia, photophobia or vomiting. Nothing aggravates the symptoms. She has tried acetaminophen and NSAIDs for the symptoms. The treatment provided mild relief.    Problems:  Patient Active Problem List   Diagnosis Date Noted   Nausea/vomiting in pregnancy 05/08/2022   History of therapeutic abortion 06/23/2020   Sickle cell trait (HCC) 05/31/2015    Allergies:  Allergies  Allergen Reactions   Milk [Milk (Cow)] Nausea And Vomiting    Stomach cramping   Medications:  Current Outpatient Medications:    naproxen (NAPROSYN) 500 MG tablet, Take 1 tablet (500 mg total) by mouth 2 (two) times daily with a meal., Disp: 30 tablet,  Rfl: 0   cyclobenzaprine (FLEXERIL) 10 MG tablet, Take 1 tablet (10 mg total) by mouth 2 (two) times daily as needed for muscle spasms., Disp: 20 tablet, Rfl: 0   docusate sodium (COLACE) 100 MG capsule, Take 1 capsule (100 mg total) by mouth daily. Can increase to twice per day for constipation, Disp: 30 capsule, Rfl: 2   Doxylamine-Pyridoxine 10-10 MG TBEC, Take 1-2 tablets by mouth 3 (three) times daily  as needed (nausea). Start with 2 tablets every evening. If symptoms persist, add 1 tablet every morning. If symptoms persist, add 1 tablet at mid-day., Disp: 60 tablet, Rfl: 3   ferrous sulfate (FERROUSUL) 325 (65 FE) MG tablet, Take 1 tablet (325 mg total) by mouth every other day., Disp: 15 tablet, Rfl: 2   Prenat-FeFum-FePo-FA-Omega 3 (CONCEPT DHA) 53.5-38-1 MG CAPS, Take 1 tablet by mouth daily., Disp: 30 capsule, Rfl: 12   promethazine (PHENERGAN) 25 MG tablet, Take 1 tablet (25 mg total) by mouth every 6 (six) hours as needed for nausea or vomiting., Disp: 30 tablet, Rfl: 0   promethazine (PHENERGAN) 25 MG tablet, Take 1 tablet (25 mg total) by mouth every 6 (six) hours as needed for nausea or vomiting., Disp: 30 tablet, Rfl: 3  Observations/Objective: Patient is well-developed, well-nourished in no acute distress.  Resting comfortably  at home.  Head is normocephalic, atraumatic.  No labored breathing.  Speech is clear and coherent with logical content.  Patient is alert and oriented at baseline.    Assessment and Plan: 1. Nonintractable headache, unspecified chronicity pattern, unspecified headache type - naproxen (NAPROSYN) 500 MG tablet; Take 1 tablet (500 mg total) by mouth 2 (two) times daily with a meal.  Dispense: 30 tablet; Refill: 0  If headache does not improve, needs to be seen in person Take naprosyn BID Encourage headache journal  Force fluids Stress management  Follow up if symptoms worsen or do not improve    Follow Up Instructions: I discussed the assessment and treatment plan with the patient. The patient was provided an opportunity to ask questions and all were answered. The patient agreed with the plan and demonstrated an understanding of the instructions.  A copy of instructions were sent to the patient via MyChart unless otherwise noted below.     The patient was advised to call back or seek an in-person evaluation if the symptoms worsen or if the condition  fails to improve as anticipated.  Time:  I spent 8 minutes with the patient via telehealth technology discussing the above problems/concerns.    Jannifer Rodney, FNP

## 2022-05-22 ENCOUNTER — Inpatient Hospital Stay (HOSPITAL_COMMUNITY)
Admission: AD | Admit: 2022-05-22 | Discharge: 2022-05-23 | Disposition: A | Discharge status: Home / Self Care | Payer: Medicaid Other | Attending: Family Medicine | Admitting: Family Medicine

## 2022-05-22 DIAGNOSIS — Z3A09 9 weeks gestation of pregnancy: Secondary | ICD-10-CM | POA: Diagnosis not present

## 2022-05-22 DIAGNOSIS — O99351 Diseases of the nervous system complicating pregnancy, first trimester: Secondary | ICD-10-CM | POA: Diagnosis not present

## 2022-05-22 DIAGNOSIS — G43E09 Chronic migraine with aura, not intractable, without status migrainosus: Secondary | ICD-10-CM | POA: Insufficient documentation

## 2022-05-22 DIAGNOSIS — Z79899 Other long term (current) drug therapy: Secondary | ICD-10-CM | POA: Insufficient documentation

## 2022-05-22 LAB — CBC
HCT: 32.9 % — ABNORMAL LOW (ref 36.0–46.0)
Hemoglobin: 11 g/dL — ABNORMAL LOW (ref 12.0–15.0)
MCH: 25.9 pg — ABNORMAL LOW (ref 26.0–34.0)
MCHC: 33.4 g/dL (ref 30.0–36.0)
MCV: 77.4 fL — ABNORMAL LOW (ref 80.0–100.0)
Platelets: 268 10*3/uL (ref 150–400)
RBC: 4.25 MIL/uL (ref 3.87–5.11)
RDW: 16.1 % — ABNORMAL HIGH (ref 11.5–15.5)
WBC: 8.9 10*3/uL (ref 4.0–10.5)
nRBC: 0 % (ref 0.0–0.2)

## 2022-05-22 MED ORDER — SODIUM CHLORIDE 0.9 % IV SOLN
25.0000 mg | Freq: Once | INTRAVENOUS | Status: AC
  Administered 2022-05-22: 25 mg via INTRAVENOUS
  Filled 2022-05-22: qty 1

## 2022-05-22 MED ORDER — ACETAMINOPHEN-CAFFEINE 500-65 MG PO TABS
2.0000 | ORAL_TABLET | Freq: Once | ORAL | Status: AC
  Administered 2022-05-22: 2 via ORAL
  Filled 2022-05-22: qty 2

## 2022-05-22 MED ORDER — CYCLOBENZAPRINE HCL 5 MG PO TABS
10.0000 mg | ORAL_TABLET | Freq: Once | ORAL | Status: AC
  Administered 2022-05-22: 10 mg via ORAL
  Filled 2022-05-22: qty 2

## 2022-05-22 NOTE — MAU Note (Signed)
Pt says she has H/A- started FRiday - took XS Tyl. Did Virtual visit yesterday with Jannifer Rodney NP- gave RX of 500mg  of Naproxen-  Took last time at 4 pm  Told if doesn't help - then come here  Pain- 8

## 2022-05-22 NOTE — MAU Provider Note (Signed)
Chief Complaint:  Headache   Event Date/Time   First Provider Initiated Contact with Patient 05/22/22 2044     HPI: Heidi Martin is a 21 y.o. [id number] at [redacted]w[redacted]d who presents to maternity admissions reporting headache since Friday. Has tried multiple doses of 500mg  Tylenol and baby aspirin, last Tylenol dose at 8pm last night. Saw an NP today who prescribed naproxen which did not help at all. Has a history of migraines, usually a BC powder helps immediately. No other physical complaints.  Pregnancy Course: Receives OB care at [facility], just established care.  Past Medical History:  Diagnosis Date   Alpha thalassemia silent carrier    COVID-19    Irritable bowel syndrome (IBS)    Sickle cell trait (HCC)    OB History  Gravida Para Term Preterm AB Living  4 1 1   2 1   SAB IAB Ectopic Multiple Live Births  1 1   0 1    # Outcome Date GA Lbr Len/2nd Weight Sex Delivery Anes PTL Lv  4 Current           3 SAB 10/2021          2 Term 11/30/20 [redacted]w[redacted]d 02:51 / 00:35 5 lb 11.9 oz (2.605 kg) F Vag-Spont EPI  LIV  1 IAB 2021           Past Surgical History:  Procedure Laterality Date   DILATION AND EVACUATION N/A 10/27/2021   Procedure: DILATATION AND EVACUATION;  Surgeon: 2022, MD;  Location: [facility] OR;  Service: Gynecology;  Laterality: N/A;   INDUCED ABORTION     NO PAST SURGERIES     Family History  Problem Relation Age of Onset   Cancer Mother    Depression Mother    Miscarriages / Stillbirths Mother    Hypertension Mother    Hypertension Father    Cancer Maternal Grandmother    Diabetes Maternal Grandmother    Miscarriages / Stillbirths Maternal Grandmother    Social History   Tobacco Use   Smoking status: Never   Smokeless tobacco: Never  Vaping Use   Vaping Use: Never used  Substance Use Topics   Alcohol use: No   Drug use: No   Allergies  Allergen Reactions   Milk [Milk (Cow)] Nausea And Vomiting    Stomach cramping   No medications prior to admission.    I have reviewed patient's Past Medical Hx, Surgical Hx, Family Hx, Social Hx, medications and allergies.   ROS:  Pertinent items noted in HPI and remainder of comprehensive ROS otherwise negative.   Physical Exam  Patient Vitals for the past 24 hrs:  BP Temp Temp src Pulse Resp Height Weight  05/23/22 0111 111/70 -- -- 67 -- -- --  05/22/22 2018 102/61 98.1 F (36.7 C) Oral 80 16 5\' 7"  (1.702 m) 179 lb 6.4 oz (81.4 kg)   Constitutional: Well-developed, well-nourished female in no acute distress.  Cardiovascular: normal rate & rhythm, warm and well-perfused Respiratory: normal effort, no problems with respiration noted MS: Extremities nontender, no edema, normal ROM Neurologic: Alert and oriented x 4.  GU: no CVA tenderness Pelvic: exam deferred   Labs: Results for orders placed or performed during the hospital encounter of 05/22/22 (from the past 24 hour(s))  CBC     Status: Abnormal   Collection Time: 05/22/22 10:30 PM  Result Value Ref Range   WBC 8.9 4.0 - 10.5 K/uL   RBC 4.25 3.87 - 5.11 MIL/uL  Hemoglobin 11.0 (L) 12.0 - 15.0 g/dL   HCT 50.3 (L) 88.8 - 28.0 %   MCV 77.4 (L) 80.0 - 100.0 fL   MCH 25.9 (L) 26.0 - 34.0 pg   MCHC 33.4 30.0 - 36.0 g/dL   RDW 03.4 (H) 91.7 - 91.5 %   Platelets 268 150 - 400 K/uL   nRBC 0.0 0.0 - 0.2 %  Comprehensive metabolic panel     Status: Abnormal   Collection Time: 05/22/22 10:30 PM  Result Value Ref Range   Sodium 138 135 - 145 mmol/L   Potassium 3.8 3.5 - 5.1 mmol/L   Chloride 104 98 - 111 mmol/L   CO2 23 22 - 32 mmol/L   Glucose, Bld 76 70 - 99 mg/dL   BUN 5 (L) 6 - 20 mg/dL   Creatinine, Ser 0.56 0.44 - 1.00 mg/dL   Calcium 8.9 8.9 - 97.9 mg/dL   Total Protein 6.3 (L) 6.5 - 8.1 g/dL   Albumin 3.5 3.5 - 5.0 g/dL   AST 17 15 - 41 U/L   ALT 13 0 - 44 U/L   Alkaline Phosphatase 54 38 - 126 U/L   Total Bilirubin 0.4 0.3 - 1.2 mg/dL   GFR, Estimated >48 >01 mL/min   Anion gap 11 5 - 15    Imaging:  No results  found.  MAU Course: Orders Placed This Encounter  Procedures   CBC   Comprehensive metabolic panel   Discharge patient   Meds ordered this encounter  Medications   acetaminophen-caffeine (EXCEDRIN TENSION HEADACHE) 500-65 MG per tablet 2 tablet   cyclobenzaprine (FLEXERIL) tablet 10 mg   promethazine (PHENERGAN) 25 mg in sodium chloride 0.9 % 1,000 mL infusion   MDM: Excedrin tension + flexeril given with no relief of headache IV phenergan in NS ordered - initially reported only partial relief but after sleeping for an hour (while waiting for labs), headache completely resolved. Has a script for phenergan, pt advised she can take excedrin, flexeril and phenergan for future headaches. Can also refer to headache specialist if needed.  Assessment: 1. 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 [redacted] weeks gestation of pregnancy    Plan: Discharge home in stable condition.     Follow-up Information     [facility] Follow up.   Specialty: Obstetrics and Gynecology Why: as scheduled for ongoing prenatal care Contact information: [address] [place] [address] [phone]                Allergies as of 05/23/2022       Reactions   Milk [milk (cow)] Nausea And Vomiting   Stomach cramping        Medication List     STOP taking these medications    naproxen 500 MG tablet Commonly known as: Naprosyn       TAKE these medications    Concept DHA 53.5-38-1 MG Caps Take 1 tablet by mouth daily.   cyclobenzaprine 10 MG tablet Commonly known as: FLEXERIL Take 1 tablet (10 mg total) by mouth 2 (two) times daily as needed for muscle spasms.   docusate sodium 100 MG capsule Commonly known as: COLACE Take 1 capsule (100 mg total) by mouth daily. Can increase to twice per day for constipation   Doxylamine-Pyridoxine 10-10 MG Tbec Take 1-2 tablets by mouth 3  (three) times daily as needed (nausea). Start with 2 tablets every evening. If  symptoms persist, add 1 tablet every morning. If symptoms persist, add 1 tablet at mid-day.   ferrous sulfate 325 (65 FE) MG tablet Commonly known as: FerrouSul Take 1 tablet (325 mg total) by mouth every other day.   promethazine 25 MG tablet Commonly known as: PHENERGAN Take 1 tablet (25 mg total) by mouth every 6 (six) hours as needed for nausea or vomiting.   promethazine 25 MG tablet Commonly known as: PHENERGAN Take 1 tablet (25 mg total) by mouth every 6 (six) hours as needed for nausea or vomiting.       Edd Arbour, CNM, MSN, IBCLC Certified Nurse Midwife, [facility]

## 2022-05-23 DIAGNOSIS — G43E09 Chronic migraine with aura, not intractable, without status migrainosus: Secondary | ICD-10-CM | POA: Diagnosis not present

## 2022-05-23 DIAGNOSIS — Z3A09 9 weeks gestation of pregnancy: Secondary | ICD-10-CM | POA: Diagnosis not present

## 2022-05-23 LAB — COMPREHENSIVE METABOLIC PANEL
ALT: 13 U/L (ref 0–44)
AST: 17 U/L (ref 15–41)
Albumin: 3.5 g/dL (ref 3.5–5.0)
Alkaline Phosphatase: 54 U/L (ref 38–126)
Anion gap: 11 (ref 5–15)
BUN: 5 mg/dL — ABNORMAL LOW (ref 6–20)
CO2: 23 mmol/L (ref 22–32)
Calcium: 8.9 mg/dL (ref 8.9–10.3)
Chloride: 104 mmol/L (ref 98–111)
Creatinine, Ser: 0.82 mg/dL (ref 0.44–1.00)
GFR, Estimated: >60 mL/min (ref >60)
Glucose, Bld: 76 mg/dL (ref 70–99)
Potassium: 3.8 mmol/L (ref 3.5–5.1)
Sodium: 138 mmol/L (ref 135–145)
Total Bilirubin: 0.4 mg/dL (ref 0.3–1.2)
Total Protein: 6.3 g/dL — ABNORMAL LOW (ref 6.5–8.1)

## 2022-05-23 NOTE — Discharge Instructions (Signed)
Can take phenergan + 1000mg  Tylenol + flexeril for future headaches. Will refer to our headache specialist if these continue.

## 2022-05-29 ENCOUNTER — Telehealth: Payer: Medicaid Other | Admitting: Physician Assistant

## 2022-05-29 DIAGNOSIS — R0989 Other specified symptoms and signs involving the circulatory and respiratory systems: Secondary | ICD-10-CM

## 2022-05-29 DIAGNOSIS — H9203 Otalgia, bilateral: Secondary | ICD-10-CM | POA: Diagnosis not present

## 2022-05-29 DIAGNOSIS — R519 Headache, unspecified: Secondary | ICD-10-CM

## 2022-05-29 MED ORDER — AMOXICILLIN 500 MG PO CAPS: 500.0000 mg | ORAL_CAPSULE | Freq: Two times a day (BID) | ORAL | 0 refills | Status: DC

## 2022-05-29 NOTE — Progress Notes (Signed)
Virtual Visit Consent   Heidi Martin, you are scheduled for a virtual visit with a [place] provider today. Just as with appointments in the office, your consent must be obtained to participate. Your consent will be active for this visit and any virtual visit you may have with one of our providers in the next 365 days. If you have a MyChart account, a copy of this consent can be sent to you electronically.  As this is a virtual visit, video technology does not allow for your provider to perform a traditional examination. This may limit your provider's ability to fully assess your condition. If your provider identifies any concerns that need to be evaluated in person or the need to arrange testing (such as labs, EKG, etc.), we will make arrangements to do so. Although advances in technology are sophisticated, we cannot ensure that it will always work on either your end or our end. If the connection with a video visit is poor, the visit may have to be switched to a telephone visit. With either a video or telephone visit, we are not always able to ensure that we have a secure connection.  By engaging in this virtual visit, you consent to the provision of healthcare and authorize for your insurance to be billed (if applicable) for the services provided during this visit. Depending on your insurance coverage, you may receive a charge related to this service.  I need to obtain your verbal consent now. Are you willing to proceed with your visit today? Heidi Martin has provided verbal consent on 05/29/2022 for a virtual visit (video or telephone). Mar Daring, PA-C  Date: 05/29/2022 6:24 PM  Virtual Visit via Video Note   I, Mar Daring, connected with  Heidi Martin  ([id number], Jul 10, 2000) on 05/29/22 at  6:15 PM EST by a video-enabled telemedicine application and verified that I am speaking with the correct person using two identifiers.  Location: Patient: Virtual Visit Location  Patient: Home Provider: Virtual Visit Location Provider: Home Office   I discussed the limitations of evaluation and management by telemedicine and the availability of in person appointments. The patient expressed understanding and agreed to proceed.    History of Present Illness: Heidi Martin is a 21 y.o. who identifies as a female who was assigned female at birth, and is being seen today for headaches and ear pain.  HPI: Headache  This is a recurrent problem. The current episode started 1 to 4 weeks ago (Started about 10 days ago, around 05/20/22 and has been persistent since). The problem occurs intermittently. The problem has been gradually worsening. The pain is located in the Frontal region. Radiates to: ears. The pain quality is similar to prior headaches. The quality of the pain is described as aching (pressure). The pain is moderate. Associated symptoms include ear pain (bilateral), nausea (occasional with severe headache), sinus pressure and swollen glands (not swollen, but reports aching in tonsillar area). Pertinent negatives include no abdominal pain, abnormal behavior, coughing, drainage, fever, hearing loss, rhinorrhea, scalp tenderness, sore throat, tinnitus or weakness. The symptoms are aggravated by unknown. She has tried acetaminophen, NSAIDs, darkened room and Excedrin (flexeril and promethazine) for the symptoms. The treatment provided no relief (headaches will occasionally go away, but normally only for less than 24 hours then come back worse than before). Her past medical history is significant for migraine headaches.  Otalgia  There is pain in both ears. This is a new problem. The current  episode started 1 to 4 weeks ago. The problem occurs constantly. The problem has been gradually worsening. There has been no fever. The pain is moderate. Associated symptoms include headaches. Pertinent negatives include no abdominal pain, coughing, drainage, ear discharge, hearing loss, rash,  rhinorrhea or sore throat. Associated symptoms comments: Reports aching over tonsils. She has tried acetaminophen and NSAIDs for the symptoms. The treatment provided no relief.  [redacted]w[redacted]d pregnant   Problems:  Patient Active Problem List   Diagnosis Date Noted   Nausea/vomiting in pregnancy 05/08/2022   History of therapeutic abortion 06/23/2020   Sickle cell trait ([place]) 05/31/2015    Allergies:  Allergies  Allergen Reactions   Milk [Milk (Cow)] Nausea And Vomiting    Stomach cramping   Medications:  Current Outpatient Medications:    amoxicillin (AMOXIL) 500 MG capsule, Take 1 capsule (500 mg total) by mouth 2 (two) times daily for 10 days., Disp: 20 capsule, Rfl: 0   cyclobenzaprine (FLEXERIL) 10 MG tablet, Take 1 tablet (10 mg total) by mouth 2 (two) times daily as needed for muscle spasms., Disp: 20 tablet, Rfl: 0   docusate sodium (COLACE) 100 MG capsule, Take 1 capsule (100 mg total) by mouth daily. Can increase to twice per day for constipation, Disp: 30 capsule, Rfl: 2   Doxylamine-Pyridoxine 10-10 MG TBEC, Take 1-2 tablets by mouth 3 (three) times daily as needed (nausea). Start with 2 tablets every evening. If symptoms persist, add 1 tablet every morning. If symptoms persist, add 1 tablet at mid-day., Disp: 60 tablet, Rfl: 3   ferrous sulfate (FERROUSUL) 325 (65 FE) MG tablet, Take 1 tablet (325 mg total) by mouth every other day., Disp: 15 tablet, Rfl: 2   Prenat-FeFum-FePo-FA-Omega 3 (CONCEPT DHA) 53.5-38-1 MG CAPS, Take 1 tablet by mouth daily., Disp: 30 capsule, Rfl: 12   promethazine (PHENERGAN) 25 MG tablet, Take 1 tablet (25 mg total) by mouth every 6 (six) hours as needed for nausea or vomiting., Disp: 30 tablet, Rfl: 0   promethazine (PHENERGAN) 25 MG tablet, Take 1 tablet (25 mg total) by mouth every 6 (six) hours as needed for nausea or vomiting., Disp: 30 tablet, Rfl: 3  Observations/Objective: Patient is well-developed, well-nourished in no acute distress.  Resting  comfortably at home.  Head is normocephalic, atraumatic.  No labored breathing. Speech is clear and coherent with logical content.  Patient is alert and oriented at baseline.    Assessment and Plan: 1. Sinus headache - amoxicillin (AMOXIL) 500 MG capsule; Take 1 capsule (500 mg total) by mouth 2 (two) times daily for 10 days.  Dispense: 20 capsule; Refill: 0  2. Otalgia of both ears - amoxicillin (AMOXIL) 500 MG capsule; Take 1 capsule (500 mg total) by mouth 2 (two) times daily for 10 days.  Dispense: 20 capsule; Refill: 0  3. Tonsil pain - amoxicillin (AMOXIL) 500 MG capsule; Take 1 capsule (500 mg total) by mouth 2 (two) times daily for 10 days.  Dispense: 20 capsule; Refill: 0  - Continue Tylenol 1000mg , flexeril and Promethazine as needed for migrainous type headaches - Discussed consideration of starting Imitrex for migraine but will defer to OB/GYN for that decision as I have not prescribed this medication in pregnant individuals and studies show limited research on safety - Discussed since symptoms have been consistent and progressive since 05/20/22 and now having ear pain and tonsillar pain, could add amoxicillin to see if infectious in nature; she agrees and will try Amoxicillin.  - If no improvements or  changes by 06/01/22 most likely can stop Amoxicillin as it was not infectious as thought it may have been - Keep scheduled follow up with OB/GYN on 06/01/22 - Seek in person evaluation if headache continues to worsen despite above recommendations  Follow Up Instructions: I discussed the assessment and treatment plan with the patient. The patient was provided an opportunity to ask questions and all were answered. The patient agreed with the plan and demonstrated an understanding of the instructions.  A copy of instructions were sent to the patient via MyChart unless otherwise noted below.    The patient was advised to call back or seek an in-person evaluation if the symptoms  worsen or if the condition fails to improve as anticipated.  Time:  I spent 11 minutes with the patient via telehealth technology discussing the above problems/concerns.    Margaretann Loveless, PA-C

## 2022-05-29 NOTE — Patient Instructions (Signed)
Heidi Martin, thank you for joining Margaretann Loveless, PA-C for today's virtual visit.  While this provider is not your primary care provider (PCP), if your PCP is located in our provider database this encounter information will be shared with them immediately following your visit.   A [facility] MyChart account gives you access to today's visit and all your visits, tests, and labs performed at [facility] " click here if you don't have a [facility] MyChart account or go to [email or web address]  Consent: (Patient) Heidi Martin provided verbal consent for this virtual visit at the beginning of the encounter.  Current Medications:  Current Outpatient Medications:    amoxicillin (AMOXIL) 500 MG capsule, Take 1 capsule (500 mg total) by mouth 2 (two) times daily for 10 days., Disp: 20 capsule, Rfl: 0   cyclobenzaprine (FLEXERIL) 10 MG tablet, Take 1 tablet (10 mg total) by mouth 2 (two) times daily as needed for muscle spasms., Disp: 20 tablet, Rfl: 0   docusate sodium (COLACE) 100 MG capsule, Take 1 capsule (100 mg total) by mouth daily. Can increase to twice per day for constipation, Disp: 30 capsule, Rfl: 2   Doxylamine-Pyridoxine 10-10 MG TBEC, Take 1-2 tablets by mouth 3 (three) times daily as needed (nausea). Start with 2 tablets every evening. If symptoms persist, add 1 tablet every morning. If symptoms persist, add 1 tablet at mid-day., Disp: 60 tablet, Rfl: 3   ferrous sulfate (FERROUSUL) 325 (65 FE) MG tablet, Take 1 tablet (325 mg total) by mouth every other day., Disp: 15 tablet, Rfl: 2   Prenat-FeFum-FePo-FA-Omega 3 (CONCEPT DHA) 53.5-38-1 MG CAPS, Take 1 tablet by mouth daily., Disp: 30 capsule, Rfl: 12   promethazine (PHENERGAN) 25 MG tablet, Take 1 tablet (25 mg total) by mouth every 6 (six) hours as needed for nausea or vomiting., Disp: 30 tablet, Rfl: 0   promethazine (PHENERGAN) 25 MG tablet, Take 1 tablet (25 mg total) by mouth every 6 (six) hours as needed  for nausea or vomiting., Disp: 30 tablet, Rfl: 3   Medications ordered in this encounter:  Meds ordered this encounter  Medications   amoxicillin (AMOXIL) 500 MG capsule    Sig: Take 1 capsule (500 mg total) by mouth 2 (two) times daily for 10 days.    Dispense:  20 capsule    Refill:  0    Order Specific Question:   Supervising Provider    Answer:   Merrilee Jansky [id number]     *If you need refills on other medications prior to your next appointment, please contact your pharmacy*  Follow-Up: Call back or seek an in-person evaluation if the symptoms worsen or if the condition fails to improve as anticipated.  [facility] [phone]  Other Instructions  Sinus Pain  Sinus pain may occur when your sinuses become clogged or swollen. Sinuses are air-filled spaces in your skull that are behind the bones of your face and forehead. Sinus pain can range from mild to severe. What are the causes? Sinus pain can result from various conditions that affect the sinuses. Common causes include: Colds. Sinus infections. Allergies. What are the signs or symptoms? The main symptom of this condition is pain or pressure in your face, forehead, ears, or upper teeth. People who have sinus pain often have other symptoms, such as: Congested or runny nose. Fever. Inability to smell. Headache. Weather changes can make symptoms worse. How is this diagnosed? Your health care provider will diagnose this  condition based on your symptoms and a physical exam. If you have pain that keeps coming back or does not go away, your health care provider may recommend more testing. This may include: Imaging tests, such as a CT scan or MRI, to check for problems with your sinuses. Examination of your sinuses using a thin tool with a camera that is inserted through your nose (endoscopy). How is this treated? Treatment for this condition depends on the cause. Sinus pain that is caused by a sinus  infection may be treated with antibiotic medicine. Sinus pain that is caused by congestion may be helped by rinsing out (flushing) the nose and sinuses with saline solution. Sinus pain that is caused by allergies may be helped by allergy medicines (antihistamines) and medicated nasal sprays. Sinus surgery may be needed in some cases if other treatments do not help. Follow these instructions at home: General instructions If directed: Apply a warm, moist washcloth to your face to help relieve pain. Use a nasal saline wash. Follow the directions on the bottle or box. Hydrate and humidify Drink enough water to keep your urine clear or pale yellow. Staying hydrated will help to thin your mucus. Use a humidifier if your home is dry. Inhale steam for 10-15 minutes, 3-4 times a day or as told by your health care provider. You can do this in the bathroom while a hot shower is running. Limit your exposure to cool or dry air. Medicines  Take over-the-counter and prescription medicines only as told by your health care provider. If you were prescribed an antibiotic medicine, take it as told by your health care provider. Do not stop taking the antibiotic even if you start to feel better. If you have congestion, use a nasal spray to help lessen pressure. Contact a health care provider if: You have sinus pain more than one time a week. You have sensitivity to light or sound. You develop a fever. You feel nauseous or you vomit. Your sinus pain or headache does not get better with treatment. Get help right away if: You have vision problems. You have sudden, severe pain in your face or head. You have a seizure. You are confused. You have a stiff neck. Summary Sinus pain occurs when your sinuses become clogged or swollen. Sinus pain can result from various conditions that affect the sinuses, such as a cold, a sinus infection, or an allergy. Treatment for this condition depends on the cause. It may  include medicine, such as antibiotics or antihistamines. This information is not intended to replace advice given to you by your health care provider. Make sure you discuss any questions you have with your health care provider. Document Revised: 05/22/2021 Document Reviewed: 05/22/2021 Elsevier Patient Education  2023 Elsevier Inc.    If you have been instructed to have an in-person evaluation today at a local Urgent Care facility, please use the link below. It will take you to a list of all of our available [facility], including address, phone number and hours of operation. Please do not delay care.  [facility]  If you or a family member do not have a primary care provider, use the link below to schedule a visit and establish care. When you choose a [facility] primary care physician or advanced practice provider, you gain a long-term partner in health. Find a Primary Care Provider  Learn more about [facility]'s in-office and virtual care options: [facility] - Get Care Now

## 2022-06-01 ENCOUNTER — Telehealth (INDEPENDENT_AMBULATORY_CARE_PROVIDER_SITE_OTHER): Payer: Medicaid Other

## 2022-06-01 DIAGNOSIS — O99340 Other mental disorders complicating pregnancy, unspecified trimester: Secondary | ICD-10-CM

## 2022-06-01 DIAGNOSIS — O26891 Other specified pregnancy related conditions, first trimester: Secondary | ICD-10-CM

## 2022-06-01 DIAGNOSIS — Z3490 Encounter for supervision of normal pregnancy, unspecified, unspecified trimester: Secondary | ICD-10-CM | POA: Insufficient documentation

## 2022-06-01 DIAGNOSIS — Z3689 Encounter for other specified antenatal screening: Secondary | ICD-10-CM

## 2022-06-01 MED ORDER — BLOOD PRESSURE KIT DEVI: 1.0000 | Freq: Two times a day (BID) | 0 refills | Status: DC | PRN

## 2022-06-01 MED ORDER — GOJJI WEIGHT SCALE MISC: 1.0000 | 0 refills | Status: DC | PRN

## 2022-06-01 NOTE — Patient Instructions (Addendum)
Please go to [facility] to pick up blood pressure cuff and weight scale [facility] [address]   [phone]    Safe Medications in Pregnancy   Acne:  Benzoyl Peroxide  Salicylic Acid   Backache/Headache:  Tylenol: 2 regular strength every 4 hours OR               2 Extra strength every 6 hours   Colds/Coughs/Allergies:  Benadryl (alcohol free) 25 mg every 6 hours as needed  Breath right strips  Claritin  Cepacol throat lozenges  Chloraseptic throat spray  Cold-Eeze- up to three times per day  Cough drops, alcohol free  Flonase (by prescription only)  Guaifenesin  Mucinex  Robitussin DM (plain only, alcohol free)  Saline nasal spray/drops  Sudafed (pseudoephedrine) & Actifed * use only after [redacted] weeks gestation and if you do not have high blood pressure  Tylenol  Vicks Vaporub  Zinc lozenges  Zyrtec   Constipation:  Colace  Ducolax suppositories  Fleet enema  Glycerin suppositories  Metamucil  Milk of magnesia  Miralax  Senokot  Smooth move tea   Diarrhea:  Kaopectate  Imodium A-D   *NO pepto Bismol   Hemorrhoids:  Anusol  Anusol HC  Preparation H  Tucks   Indigestion:  Tums  Maalox  Mylanta  Zantac  Pepcid   Insomnia:  Benadryl (alcohol free) 25mg  every 6 hours as needed  Tylenol PM  Unisom, no Gelcaps   Leg Cramps:  Tums  MagGel   Nausea/Vomiting:  Bonine  Dramamine  Emetrol  Ginger extract  Sea bands  Meclizine  Nausea medication to take during pregnancy:  Unisom (doxylamine succinate 25 mg tablets) Take one tablet daily at bedtime. If symptoms are not adequately controlled, the dose can be increased to a maximum recommended dose of two tablets daily (1/2 tablet in the morning, 1/2 tablet mid-afternoon and one at bedtime).  Vitamin B6 100mg  tablets. Take one tablet twice a day (up to 200 mg per day).   Skin Rashes:  Aveeno products  Benadryl cream or 25mg  every 6 hours as needed  Calamine Lotion  1% cortisone  cream   Yeast infection:  Gyne-lotrimin 7  Monistat 7    **If taking multiple medications, please check labels to avoid duplicating the same active ingredients  **take medication as directed on the label  ** Do not exceed 4000 mg of tylenol in 24 hours  **Do not take medications that contain aspirin or ibuprofen

## 2022-06-01 NOTE — Progress Notes (Signed)
Called pt @ 1010 and left message that I am calling in regards to her intake appt.  Please give office a call.  Will call back in 15 minutes in which if we do not reach her we will have to request that you reschedule your intake and provider appt.   Wyatt Thorstenson,RN  06/01/22  New OB Intake  I connected withNAME@ on 06/01/22 at 10:15 AM EST by MyChart Video Visit and verified that I am speaking with the correct person using two identifiers. Nurse is located at [facility] and pt is located at home.  I discussed the limitations, risks, security and privacy concerns of performing an evaluation and management service by telephone and the availability of in person appointments. I also discussed with the patient that there may be a patient responsible charge related to this service. The patient expressed understanding and agreed to proceed.  I explained I am completing New OB Intake today. We discussed EDD of 12/20/2022 that is based on first trimester ultrasound at [facility]. Pt is G4/P1. I reviewed her allergies, medications, Medical/Surgical/OB history, and appropriate screenings. I informed her of [facility] services. [facility] information placed in AVS. Based on history, this is a low risk pregnancy.  Patient Active Problem List   Diagnosis Date Noted   Nausea/vomiting in pregnancy 05/08/2022   History of therapeutic abortion 06/23/2020   Sickle cell trait ([place]) 05/31/2015    Concerns addressed today Patient reports that this is in a new FOB for the pregnancy.  Patient has sickle cell trait and is unsure if FOB has the trait.  Randol Kern rep, notified to send patient's FOB kit for testing.    Delivery Plans Plans to deliver at [facility] Patient given information for [facility] Healthy Baby website for more information about Women's and [place]. Patient is not interested in water birth. Offered upcoming OB visit with CNM to discuss further.  MyChart/Babyscripts MyChart access verified. I explained pt will  have some visits in office and some virtually. Babyscripts instructions given and order placed. Patient verifies receipt of registration text/e-mail. Account successfully created and app downloaded.  Blood Pressure Cuff/Weight Scale Blood pressure cuff ordered for patient to pick-up from [employer]. Explained after first prenatal appt pt will check weekly and document in 60. Patient does not have weight scale; order sent to [place], patient may track weight weekly in Babyscripts.  Anatomy US Explained first scheduled [place] will be around 19 weeks. Anatomy US scheduled for January 24 at 1445. Pt notified to arrive at 1430.  Labs Discussed Johnsie Cancel genetic screening with patient. Patient only needs Panorama as she had Horizon drawn in 2021 per chart review.    COVID Vaccine Patient has had COVID vaccine.   Is patient a CenteringPregnancy candidate?  Accepted Patient is already scheduled in Centering Group 10.    Is patient a Mom+Baby Combined Care candidate?  Not a candidate   If accepted, Mom+Baby staff notified  Social Determinants of Health Food Insecurity: Patient denies food insecurity. WIC Referral: Patient is interested in referral to [facility].  Transportation: Patient denies transportation needs. Childcare: Discussed no children allowed at ultrasound appointments. Offered childcare services; patient declines childcare services at this time.  First visit review I reviewed new OB appt with patient. I explained they will have a provider visit that includes pap smear with cultures, routine ob labs with urine culture. Explained pt will be seen by Mikel Cella, MD at first visit; encounter routed to appropriate provider. Explained that patient will  be seen by pregnancy navigator following visit with provider.   Verdell Carmine, RN 06/01/2022  10:28 AM

## 2022-06-05 ENCOUNTER — Encounter: Payer: Self-pay | Admitting: Obstetrics and Gynecology

## 2022-06-08 ENCOUNTER — Telehealth: Payer: Self-pay | Admitting: Clinical

## 2022-06-08 NOTE — Telephone Encounter (Signed)
Attempt call regarding referral; Left HIPPA-compliant message to call back Antwaine Boomhower from [facility] at  [phone] (Chelisa Hennen's office).    

## 2022-06-12 ENCOUNTER — Ambulatory Visit (INDEPENDENT_AMBULATORY_CARE_PROVIDER_SITE_OTHER): Payer: Medicaid Other | Admitting: Obstetrics and Gynecology

## 2022-06-12 ENCOUNTER — Other Ambulatory Visit: Payer: Self-pay

## 2022-06-12 ENCOUNTER — Other Ambulatory Visit (HOSPITAL_COMMUNITY)
Admission: RE | Admit: 2022-06-12 | Discharge: 2022-06-12 | Disposition: A | Discharge status: Home / Self Care | Payer: Medicaid Other | Source: Ambulatory Visit | Attending: Obstetrics and Gynecology | Admitting: Obstetrics and Gynecology

## 2022-06-12 ENCOUNTER — Other Ambulatory Visit: Payer: Self-pay | Admitting: Obstetrics and Gynecology

## 2022-06-12 VITALS — BP 119/80 | HR 91 | Wt 173.3 lb

## 2022-06-12 DIAGNOSIS — F319 Bipolar disorder, unspecified: Secondary | ICD-10-CM | POA: Insufficient documentation

## 2022-06-12 DIAGNOSIS — Z8759 Personal history of other complications of pregnancy, childbirth and the puerperium: Secondary | ICD-10-CM | POA: Insufficient documentation

## 2022-06-12 DIAGNOSIS — D563 Thalassemia minor: Secondary | ICD-10-CM | POA: Insufficient documentation

## 2022-06-12 DIAGNOSIS — Z3A12 12 weeks gestation of pregnancy: Secondary | ICD-10-CM | POA: Diagnosis not present

## 2022-06-12 DIAGNOSIS — Z3491 Encounter for supervision of normal pregnancy, unspecified, first trimester: Secondary | ICD-10-CM

## 2022-06-12 DIAGNOSIS — Z148 Genetic carrier of other disease: Secondary | ICD-10-CM | POA: Insufficient documentation

## 2022-06-12 DIAGNOSIS — Z1331 Encounter for screening for depression: Secondary | ICD-10-CM

## 2022-06-12 DIAGNOSIS — Z124 Encounter for screening for malignant neoplasm of cervix: Secondary | ICD-10-CM

## 2022-06-12 DIAGNOSIS — D573 Sickle-cell trait: Secondary | ICD-10-CM | POA: Diagnosis not present

## 2022-06-12 DIAGNOSIS — Z3490 Encounter for supervision of normal pregnancy, unspecified, unspecified trimester: Secondary | ICD-10-CM

## 2022-06-12 DIAGNOSIS — O219 Vomiting of pregnancy, unspecified: Secondary | ICD-10-CM

## 2022-06-12 DIAGNOSIS — G43809 Other migraine, not intractable, without status migrainosus: Secondary | ICD-10-CM

## 2022-06-12 DIAGNOSIS — G43909 Migraine, unspecified, not intractable, without status migrainosus: Secondary | ICD-10-CM | POA: Insufficient documentation

## 2022-06-12 DIAGNOSIS — Z23 Encounter for immunization: Secondary | ICD-10-CM

## 2022-06-12 DIAGNOSIS — Z3481 Encounter for supervision of other normal pregnancy, first trimester: Secondary | ICD-10-CM

## 2022-06-12 MED ORDER — MAGNESIUM OXIDE 400 MG PO TABS: 400.0000 mg | ORAL_TABLET | Freq: Every day | ORAL | 1 refills | Status: DC

## 2022-06-12 MED ORDER — DOXYLAMINE SUCCINATE (SLEEP) 25 MG PO TABS: 25.0000 mg | ORAL_TABLET | Freq: Every day | ORAL | 2 refills | Status: DC

## 2022-06-12 MED ORDER — PYRIDOXINE HCL 25 MG PO TABS: ORAL_TABLET | ORAL | 3 refills | Status: DC

## 2022-06-12 MED ORDER — METOCLOPRAMIDE HCL 10 MG PO TABS: 10.0000 mg | ORAL_TABLET | Freq: Four times a day (QID) | ORAL | 2 refills | Status: DC | PRN

## 2022-06-12 NOTE — Patient Instructions (Addendum)
Please let me know if your nausea and headaches don't get better in the next 1-2 weeks Your results will be on your MyChart account You can take magnesium to prevent headaches and extra strength Tylenol when you get headaches. Never take more than 8 pills of tylenol in 1 day. Do not take ibuprofen or naproxen

## 2022-06-12 NOTE — Progress Notes (Signed)
Subjective:   Heidi Martin is a 21 y.o. [phone] at 29w5dby early ultrasound being seen today for her first obstetrical visit.  Her obstetrical history is significant for intrauterine growth restriction (IUGR) and sickle cell trait, alpha thal silent carrier, SMA carrier, history of bipolar disorder & ADHD . Patient does intend to breast feed. Pregnancy history fully reviewed.  Patient reports headache, nausea, and vomiting. Tolerating saltines and liquids but daily vomiting despite diclegis and phenergan prn. Prev prescribed naproxen for headaches.  Reports worsening irritability and mood.  HISTORY: OB History  Gravida Para Term Preterm AB Living  _0 0 2 1  SAB IAB Ectopic Multiple Live Births  1 1 0 0 1    # Outcome Date GA Lbr Len/2nd Weight Sex Delivery Anes PTL Lv  4 Current           3 SAB 10/2021          2 Term 11/30/20 334w0d2:51 / 00:35 5 lb 11.9 oz (2.605 kg) F Vag-Spont EPI  LIV     Name: TALEXY, MEININGER   Apgar1: 9  Apgar5: 9  1 IAB 2021            Last pap smear: no prior paps (age 21 Past Medical History:  Diagnosis Date   Alpha thalassemia silent carrier    COVID-19    Irritable bowel syndrome (IBS)    [place]   Past Surgical History:  Procedure Laterality Date   DILATION AND EVACUATION N/A 10/27/2021   Procedure: DILATATION AND EVACUATION;  Surgeon: PiAletha HalimMD;  [facility] Service: Gynecology;  Laterality: N/A;   Family History  Problem Relation Age of Onset   Cancer Mother    Depression Mother    Miscarriages / Stillbirths Mother    Hypertension Mother    Hypertension Father    Cancer Maternal Grandmother    Diabetes Maternal Grandmother    Miscarriages / Stillbirths Maternal Grandmother    Social History   Tobacco Use   Smoking status: Never   Smokeless tobacco: Never  Vaping Use   Vaping Use: Never used  Substance Use Topics   Alcohol use: No   Drug use: No   Allergies  Allergen  Reactions   Milk [Milk (Cow)] Nausea And Vomiting    Stomach cramping   Current Outpatient Medications on File Prior to Visit  Medication Sig Dispense Refill   acetaminophen (TYLENOL) 500 MG tablet Take 500 mg by mouth every 6 (six) hours as needed.     Blood Pressure Monitoring (BLOOD PRESSURE KIT) DEVI 1 each by Does not apply route 3 times/day as needed-between meals & bedtime. 1 each 0   Misc. Devices (GOWest [place]MISC 1 each by Does not apply route as needed. 1 each 0   Prenat-FeFum-FePo-FA-Omega 3 (CONCEPT DHA) 53.5-38-1 MG CAPS Take 1 tablet by mouth daily. 30 capsule 12   promethazine (PHENERGAN) 25 MG tablet Take 1 tablet (25 mg total) by mouth every 6 (six) hours as needed for nausea or vomiting. 30 tablet 3   [DISCONTINUED] dicyclomine (BENTYL) 20 MG tablet Take 1 tablet (20 mg total) by mouth 2 (two) times daily. (Patient not taking: No sig reported) 20 tablet 0   No current facility-administered medications on file prior to visit.   Exam   Vitals:   06/12/22 0922  BP: 119/80  Pulse: 91  Weight: 173 lb 4.8 oz (78.6  kg)   Fetal Heart Rate (bpm): 156  General:  Alert, oriented and cooperative. Patient is in no acute distress.  Breast:  deferred  Cardiovascular: Normal heart rate noted  Respiratory: Normal respiratory effort, no problems with respiration noted  Abdomen: Soft, gravid, appropriate for gestational age.  Pain/Pressure: Absent     Pelvic: NEFG. Thin physiologic discharge. Cervix visually normal. Pap collected         Extremities: Normal range of motion.  Edema: None  Mental Status: Normal mood and affect. Normal behavior. Normal judgment and thought content.    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 Flu shot today Continue prenatal vitamins. Genetic Screening discussed: NIPS ordered. Reviewed AFP at next appointment Ultrasound discussed; fetal anatomic survey: scheduled 1/24. Problem list reviewed and updated. The nature of [facility] with multiple MDs and other Advanced Practice Providers was explained to patient; also emphasized that residents, students are part of our team. Routine obstetric precautions reviewed. -     Hemoglobin A1c -     CBC/D/Plt+RPR+Rh+ABO+RubIgG... -     Culture, OB Urine -     Panorama Prenatal Test Full Panel  Bipolar affective disorder & positive screening for depression on 9-item Patient Health Questionnaire (PHQ-9) Denies SI Previously contacted by behavioral health, patient plans to schedule appointment before she leaves today -     Ambulatory referral to Psychiatry  Sickle cell trait, alpha thalassemia silent carrier & SMA carrier Partner received test kit & sent on 12/6  Nausea and vomiting during pregnancy B6/unisom and reglan sent by Dr. Rip Harbour  Cervical cancer screening -     Cytology - PAP( )  Migraine without status migrainosus, not intractable Reviewed safe medications in pregnancy, avoidance of NSAIDs - Rx for Mag ox ppx sent by Dr. Rip Harbour  History of [place] ldASA  Return in about 4 weeks (around 07/10/2022).  Gale Journey, MD [place], [facility] for [employer], [place]

## 2022-06-13 LAB — CBC/D/PLT+RPR+RH+ABO+RUBIGG...
Antibody Screen: NEGATIVE
Basophils Absolute: 0.1 10*3/uL (ref 0.0–0.2)
Basos: 1 %
EOS (ABSOLUTE): 0 10*3/uL (ref 0.0–0.4)
Eos: 0 %
HCV Ab: NONREACTIVE
HIV Screen 4th Generation wRfx: NONREACTIVE
Hematocrit: 35.2 % (ref 34.0–46.6)
Hemoglobin: 11.5 g/dL (ref 11.1–15.9)
Hepatitis B Surface Ag: NEGATIVE
Lymphocytes Absolute: 2.3 10*3/uL (ref 0.7–3.1)
Lymphs: 40 %
MCH: 25.3 pg — ABNORMAL LOW (ref 26.6–33.0)
MCHC: 32.7 g/dL (ref 31.5–35.7)
MCV: 78 fL — ABNORMAL LOW (ref 79–97)
Monocytes Absolute: 0.3 10*3/uL (ref 0.1–0.9)
Monocytes: 6 %
Neutrophils Absolute: 3 10*3/uL (ref 1.4–7.0)
Neutrophils: 53 %
Platelets: 228 10*3/uL (ref 150–450)
RBC: 4.54 x10E6/uL (ref 3.77–5.28)
RDW: 15.9 % — ABNORMAL HIGH (ref 11.7–15.4)
RPR Ser Ql: NONREACTIVE
Rh Factor: POSITIVE
Rubella Antibodies, IGG: 3.93 index (ref >0.99)
WBC: 5.7 10*3/uL (ref 3.4–10.8)

## 2022-06-13 LAB — HEMOGLOBIN A1C
Est. average glucose Bld gHb Est-mCnc: 111 mg/dL
Hgb A1c MFr Bld: 5.5 % (ref 4.8–5.6)

## 2022-06-13 LAB — HCV INTERPRETATION

## 2022-06-15 ENCOUNTER — Encounter: Payer: Self-pay | Admitting: Family Medicine

## 2022-06-15 LAB — CYTOLOGY - PAP
Chlamydia: NEGATIVE
Comment: NEGATIVE
Comment: NEGATIVE
Comment: NORMAL
Neisseria Gonorrhea: NEGATIVE
Trichomonas: NEGATIVE

## 2022-06-16 ENCOUNTER — Encounter: Payer: Self-pay | Admitting: Physical Therapy

## 2022-06-16 ENCOUNTER — Other Ambulatory Visit: Payer: Self-pay

## 2022-06-16 ENCOUNTER — Ambulatory Visit: Payer: Medicaid Other | Attending: Obstetrics and Gynecology | Admitting: Physical Therapy

## 2022-06-16 ENCOUNTER — Encounter: Payer: Self-pay | Admitting: Obstetrics and Gynecology

## 2022-06-16 DIAGNOSIS — M6281 Muscle weakness (generalized): Secondary | ICD-10-CM | POA: Insufficient documentation

## 2022-06-16 DIAGNOSIS — F32A Depression, unspecified: Secondary | ICD-10-CM | POA: Diagnosis not present

## 2022-06-16 DIAGNOSIS — R87612 Low grade squamous intraepithelial lesion on cytologic smear of cervix (LGSIL): Secondary | ICD-10-CM | POA: Insufficient documentation

## 2022-06-16 DIAGNOSIS — R293 Abnormal posture: Secondary | ICD-10-CM | POA: Insufficient documentation

## 2022-06-16 DIAGNOSIS — O99340 Other mental disorders complicating pregnancy, unspecified trimester: Secondary | ICD-10-CM | POA: Insufficient documentation

## 2022-06-16 NOTE — Therapy (Signed)
OUTPATIENT PHYSICAL THERAPY FEMALE PELVIC EVALUATION   Patient Name: Heidi Martin MRN: [id number] DOB:[birth year], 21 y.o., female Today's Date: 06/16/2022  END OF SESSION:  PT End of Session - 06/16/22 0902     Visit Number 1    Date for PT Re-Evaluation 09/08/22    Authorization Type [employer] [place]    PT Start Time [phone]    PT Stop Time 0929    PT Time Calculation (min) 37 min    Activity Tolerance Patient tolerated treatment well    Behavior During Therapy WFL for tasks assessed/performed             Past Medical History:  Diagnosis Date   Alpha thalassemia silent carrier    COVID-19    Irritable bowel syndrome (IBS)    Sickle cell trait ([place])    Past Surgical History:  Procedure Laterality Date   DILATION AND EVACUATION N/A 10/27/2021   Procedure: DILATATION AND EVACUATION;  Surgeon: Aletha Halim, MD;  Location: [facility];  Service: Gynecology;  Laterality: N/A;   Patient Active Problem List   Diagnosis Date Noted   LSIL pap 06/16/2022   Alpha thalassemia silent carrier 06/12/2022   Carrier of spinal muscular atrophy 06/12/2022   Bipolar disorder & ADHD 06/12/2022   Migraine headache 06/12/2022   History of FGR 06/12/2022   Supervision of low-risk pregnancy 06/01/2022   Nausea/vomiting in pregnancy 05/08/2022   Sickle cell trait ([place]) 05/31/2015    PCP: Kristie Cowman, MD   REFERRING PROVIDER: Inez Catalina, MD   REFERRING DIAG: [phone] (ICD-10-CM) - Pelvic pain affecting pregnancy in first trimester, antepartum   THERAPY DIAG:  Muscle weakness (generalized)  Abnormal posture  Rationale for Evaluation and Treatment: Rehabilitation  ONSET DATE: during last pregnancy  SUBJECTIVE:                                                                                                                                                                                           SUBJECTIVE STATEMENT: When I walk my hips are grinding together.   It starting in last pregnancy and got to where I couldn't walking.  It hurts at night when sleeping Fluid intake: Yes: water regular    PAIN:  Are you having pain? Yes NPRS scale: 5/10 Pain location:  anterior pubic bone  Pain type: grinding Pain description: intermittent and aching   Aggravating factors: walking Relieving factors: sitting  PRECAUTIONS: None  WEIGHT BEARING RESTRICTIONS: No  FALLS:  Has patient fallen in last 6 months? 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  PLOF: Independent  PATIENT GOALS: being able to walk  PERTINENT HISTORY:  IBS, [redacted] weeks pregnant, pain since previous pregnancy Sexual abuse: No  BOWEL MOVEMENT: Pain with bowel movement: Yes Type of bowel movement:Type (Bristol Stool Scale) constipated, Frequency less often than normal, and Strain Yes Fiber supplement: No  URINATION: Pain with urination: No Fully empty bladder: No Stream: Strong Leakage:  no Pads: No  INTERCOURSE: Pain with intercourse: During Penetration and After Intercourse Ability to have vaginal penetration:  Yes: it just hurts Climax: No Marinoff Scale: 2/3  PREGNANCY: Vaginal deliveries 1 Tearing Yes:     PROLAPSE: None   OBJECTIVE:   DIAGNOSTIC FINDINGS:    PATIENT SURVEYS:    PFIQ-7 = 57  COGNITION: Overall cognitive status: Within functional limits for tasks assessed     SENSATION:   MUSCLE LENGTH: Hamstrings: painful to attempt   LUMBAR SPECIAL TESTS:    FUNCTIONAL TESTS:  Single leg stand  GAIT:  Comments: short step length and lateral shift, slower gait speed  POSTURE: increased lumbar lordosis and anterior pelvic tilt  PELVIC ALIGNMENT: normal  LUMBARAROM/PROM:  A/PROM A/PROM  eval  Flexion WFL +P  Extension WFL   Right lateral flexion WFL   Left lateral flexion WFL   Right rotation WFL   Left rotation WFL     (Blank rows = not tested)  LOWER EXTREMITY ROM:  Passive ROM Right eval Left eval  Hip flexion 60% +P 50% +P  Hip extension    Hip abduction    Hip adduction    Hip internal rotation [facility]  WFL   Hip external rotation 50% 50% +P  Knee flexion    Knee extension    Ankle dorsiflexion    Ankle plantarflexion    Ankle inversion    Ankle eversion     (Blank rows = not tested)  LOWER EXTREMITY MMT:  MMT Right eval Left eval  Hip flexion 4/5 3/5 +P  Hip extension    Hip abduction    Hip adduction    Hip internal rotation 5/5 3/5+P  Hip external rotation 5/5 4/5  Knee flexion    Knee extension    Ankle dorsiflexion    Ankle plantarflexion    Ankle inversion    Ankle eversion     PALPATION:   General  guarded and more pain in hooklying than LE straight, tender to palpation hip flexors, pelvic compression increases pain                External Perineal Exam tender to palpation pubic symphasis, adductors, ischiocavernosis and bulbocavernosis                             Internal Pelvic Floor just palpation of introitus was painful  Patient confirms identification and approves PT to assess internal pelvic floor and treatment Yes  PELVIC MMT:   MMT eval  Vaginal   Internal Anal Sphincter   External Anal Sphincter   Puborectalis   Diastasis Recti   (Blank rows = not tested)        TONE: high  PROLAPSE: no  TODAY'S TREATMENT:  DATE: 06/16/22  EVAL and educated on initial HEP stretch   PATIENT EDUCATION:  Education details: Access Code: [email or web address] Person educated: Patient Education method: Programmer, multimedia, Demonstration, Actor cues, Verbal cues, and Handouts Education comprehension: verbalized understanding and returned demonstration  HOME EXERCISE PROGRAM: Access Code: [email or web address] URL: [email or web address] Date:  06/16/2022 Prepared by: Dwana Curd  Exercises - Standing Lumbar Extension at Wall - Forearms  - 1 x daily - 7 x weekly - 3 sets - 10 reps  ASSESSMENT:  CLINICAL IMPRESSION: Patient is a 21 y.o. female who was seen today for physical therapy evaluation and treatment for pelvic pain during pregnancy.   This is patient's 2nd pregnancy and she has had this pain in the past. Pt also has history of constipation and dyspareunia since she can remember . Pt has very high tone pelvic floor and increased pain with pelvic compression.  Adductors are tight and tender to palpation.  Left hip ROM produces pain and tight bilaterally.  Pt has tight and tender hip flexors.  Gluteals not tight with hip pain and weakness with MMT bilaterally.  Pt has more pain with movements producing pelvic rotation.  Very slow gait with short stride length and lateal weight shifting.  Pt will benefit from skilled PT to address impairments and improve function for healthy pregnancy.  OBJECTIVE IMPAIRMENTS: difficulty walking, decreased ROM, decreased strength, increased muscle spasms, impaired flexibility, impaired tone, postural dysfunction, and pain.   ACTIVITY LIMITATIONS: squatting, sleeping, and toileting  PARTICIPATION LIMITATIONS: shopping, community activity, and occupation  PERSONAL FACTORS: Time since onset of injury/illness/exacerbation and 1-2 comorbidities: currently pregnant, previous SVD with tearing  are also affecting patient's functional outcome.   REHAB POTENTIAL: Excellent  CLINICAL DECISION MAKING: Evolving/moderate complexity  EVALUATION COMPLEXITY: Moderate   GOALS: Goals reviewed with patient? Yes  SHORT TERM GOALS: Target date: 07/14/22  Ind with stretches and initial HEP for pain management Baseline: Goal status: INITIAL  LONG TERM GOALS: Target date: 09/08/2022  Pt will be independent with advanced HEP to maintain improvements made throughout therapy  Baseline:  Goal status:  INITIAL  2.  Pt will report 75% reduction of pain due to improvements in posture, strength, and muscle length  Baseline: 5/10 pain Goal status: INITIAL  3.  Pt will be able to walk for at least 30 minutes to do work related activities without increased pain Baseline: walking any distance causes pain Goal status: INITIAL  4.  Pt will be able to bend and lift without pain for improved functional and work related activities Baseline: increased pain with forward flexion Goal status: INITIAL  5.  Pt will improve bowel movements with less straining due to understanding proper toileting techniques and diaphragmatic breathing Baseline: has to strain Goal status: INITIAL    PLAN:  PT FREQUENCY: 1x/week  PT DURATION: 12 weeks  PLANNED INTERVENTIONS: Therapeutic exercises, Therapeutic activity, Neuromuscular re-education, Balance training, Gait training, Patient/Family education, Self Care, Joint mobilization, Dry Needling, Electrical stimulation, Cryotherapy, Moist heat, Taping, Biofeedback, Manual therapy, and Re-evaluation  PLAN FOR NEXT SESSION: core strength, gentle hip ROM and mobility,    Jakki L Dionne Rossa, PT 06/16/2022, 10:19 AM

## 2022-06-17 LAB — PANORAMA PRENATAL TEST FULL PANEL:PANORAMA TEST PLUS 5 ADDITIONAL MICRODELETIONS: FETAL FRACTION: 5.5

## 2022-06-20 ENCOUNTER — Ambulatory Visit: Payer: Medicaid Other | Admitting: Physical Therapy

## 2022-06-20 ENCOUNTER — Telehealth: Payer: Self-pay | Admitting: Physical Therapy

## 2022-06-20 NOTE — Therapy (Deleted)
OUTPATIENT PHYSICAL THERAPY FEMALE PELVIC TREATMENT   Patient Name: Heidi Martin MRN: [id number] DOB:01/06/2001, 21 y.o., female Today's Date: 06/20/2022  END OF SESSION:    Past Medical History:  Diagnosis Date   Alpha thalassemia silent carrier    COVID-19    Irritable bowel syndrome (IBS)    Sickle cell trait ([facility])    Past Surgical History:  Procedure Laterality Date   DILATION AND EVACUATION N/A 10/27/2021   Procedure: DILATATION AND EVACUATION;  Surgeon: [place] Bing, MD;  Location: [facility];  Service: Gynecology;  Laterality: N/A;   Patient Active Problem List   Diagnosis Date Noted   LSIL pap 06/16/2022   Alpha thalassemia silent carrier 06/12/2022   Carrier of spinal muscular atrophy 06/12/2022   Bipolar disorder & ADHD 06/12/2022   Migraine headache 06/12/2022   History of FGR 06/12/2022   Supervision of low-risk pregnancy 06/01/2022   Nausea/vomiting in pregnancy 05/08/2022   Sickle cell trait ([facility]) 05/31/2015    PCP: Knox Royalty, MD   REFERRING PROVIDER: Lennart Pall, MD   REFERRING DIAG: [phone] (ICD-10-CM) - Pelvic pain affecting pregnancy in first trimester, antepartum   THERAPY DIAG:  No diagnosis found.  Rationale for Evaluation and Treatment: Rehabilitation  ONSET DATE: during last pregnancy  SUBJECTIVE:                                                                                                                                                                                           SUBJECTIVE STATEMENT: When I walk my hips are grinding together.  It starting in last pregnancy and got to where I couldn't walking.  It hurts at night when sleeping Fluid intake: Yes: water regular    PAIN:  Are you having pain? Yes NPRS scale: 5/10 Pain location:  anterior pubic bone  Pain type: grinding Pain description: intermittent and aching   Aggravating factors: walking Relieving factors: sitting  PRECAUTIONS: None  WEIGHT  BEARING RESTRICTIONS: No  FALLS:  Has patient fallen in last 6 months? 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  PLOF: Independent  PATIENT GOALS: being able to walk  PERTINENT HISTORY:  IBS, [redacted] weeks pregnant, pain since previous pregnancy Sexual abuse: No  BOWEL MOVEMENT: Pain with bowel movement: Yes Type of bowel movement:Type (Bristol Stool Scale) constipated, Frequency less often than normal, and Strain Yes Fiber supplement: No  URINATION: Pain with urination: No Fully empty bladder: No Stream: Strong Leakage:  no Pads: No  INTERCOURSE: Pain with intercourse: During Penetration and After Intercourse Ability  to have vaginal penetration:  Yes: it just hurts Climax: No Marinoff Scale: 2/3  PREGNANCY: Vaginal deliveries 1 Tearing Yes:     PROLAPSE: None   OBJECTIVE:   DIAGNOSTIC FINDINGS:    PATIENT SURVEYS:    PFIQ-7 = 57  COGNITION: Overall cognitive status: Within functional limits for tasks assessed     SENSATION:   MUSCLE LENGTH: Hamstrings: painful to attempt   LUMBAR SPECIAL TESTS:    FUNCTIONAL TESTS:  Single leg stand  GAIT:  Comments: short step length and lateral shift, slower gait speed  POSTURE: increased lumbar lordosis and anterior pelvic tilt  PELVIC ALIGNMENT: normal  LUMBARAROM/PROM:  A/PROM A/PROM  eval  Flexion WFL +P  Extension WFL   Right lateral flexion WFL   Left lateral flexion WFL   Right rotation WFL   Left rotation WFL    (Blank rows = not tested)  LOWER EXTREMITY ROM:  Passive ROM Right eval Left eval  Hip flexion 60% +P 50% +P  Hip extension    Hip abduction    Hip adduction    Hip internal rotation [facility]  WFL   Hip external rotation 50% 50% +P  Knee flexion    Knee extension    Ankle dorsiflexion    Ankle plantarflexion    Ankle inversion    Ankle eversion     (Blank rows = not  tested)  LOWER EXTREMITY MMT:  MMT Right eval Left eval  Hip flexion 4/5 3/5 +P  Hip extension    Hip abduction    Hip adduction    Hip internal rotation 5/5 3/5+P  Hip external rotation 5/5 4/5  Knee flexion    Knee extension    Ankle dorsiflexion    Ankle plantarflexion    Ankle inversion    Ankle eversion     PALPATION:   General  guarded and more pain in hooklying than LE straight, tender to palpation hip flexors, pelvic compression increases pain                External Perineal Exam tender to palpation pubic symphasis, adductors, ischiocavernosis and bulbocavernosis                             Internal Pelvic Floor just palpation of introitus was painful  Patient confirms identification and approves PT to assess internal pelvic floor and treatment Yes  PELVIC MMT:   MMT eval  Vaginal   Internal Anal Sphincter   External Anal Sphincter   Puborectalis   Diastasis Recti   (Blank rows = not tested)        TONE: high  PROLAPSE: no  TODAY'S TREATMENT:                                                                                                                              DATE: 06/16/22  EVAL and educated on initial HEP stretch  PATIENT EDUCATION:  Education details: Access Code: [email or web address] Person educated: Patient Education method: Explanation, Demonstration, Tactile cues, Verbal cues, and Handouts Education comprehension: verbalized understanding and returned demonstration  HOME EXERCISE PROGRAM: Access Code: [email or web address] URL: [email or web address] Date: 06/16/2022 Prepared by: Dwana Curd  Exercises - Standing Lumbar Extension at Wall - Forearms  - 1 x daily - 7 x weekly - 3 sets - 10 reps  ASSESSMENT:  CLINICAL IMPRESSION: Patient is a 21 y.o. female who was seen today for physical therapy evaluation and treatment for pelvic pain during pregnancy.   This is patient's 2nd pregnancy and she has had this pain in the past. Pt  also has history of constipation and dyspareunia since she can remember . Pt has very high tone pelvic floor and increased pain with pelvic compression.  Adductors are tight and tender to palpation.  Left hip ROM produces pain and tight bilaterally.  Pt has tight and tender hip flexors.  Gluteals not tight with hip pain and weakness with MMT bilaterally.  Pt has more pain with movements producing pelvic rotation.  Very slow gait with short stride length and lateal weight shifting.  Pt will benefit from skilled PT to address impairments and improve function for healthy pregnancy.  OBJECTIVE IMPAIRMENTS: difficulty walking, decreased ROM, decreased strength, increased muscle spasms, impaired flexibility, impaired tone, postural dysfunction, and pain.   ACTIVITY LIMITATIONS: squatting, sleeping, and toileting  PARTICIPATION LIMITATIONS: shopping, community activity, and occupation  PERSONAL FACTORS: Time since onset of injury/illness/exacerbation and 1-2 comorbidities: currently pregnant, previous SVD with tearing  are also affecting patient's functional outcome.   REHAB POTENTIAL: Excellent  CLINICAL DECISION MAKING: Evolving/moderate complexity  EVALUATION COMPLEXITY: Moderate   GOALS: Goals reviewed with patient? Yes  SHORT TERM GOALS: Target date: 07/14/22  Ind with stretches and initial HEP for pain management Baseline: Goal status: INITIAL  LONG TERM GOALS: Target date: 09/08/2022  Pt will be independent with advanced HEP to maintain improvements made throughout therapy  Baseline:  Goal status: INITIAL  2.  Pt will report 75% reduction of pain due to improvements in posture, strength, and muscle length  Baseline: 5/10 pain Goal status: INITIAL  3.  Pt will be able to walk for at least 30 minutes to do work related activities without increased pain Baseline: walking any distance causes pain Goal status: INITIAL  4.  Pt will be able to bend and lift without pain for improved  functional and work related activities Baseline: increased pain with forward flexion Goal status: INITIAL  5.  Pt will improve bowel movements with less straining due to understanding proper toileting techniques and diaphragmatic breathing Baseline: has to strain Goal status: INITIAL    PLAN:  PT FREQUENCY: 1x/week  PT DURATION: 12 weeks  PLANNED INTERVENTIONS: Therapeutic exercises, Therapeutic activity, Neuromuscular re-education, Balance training, Gait training, Patient/Family education, Self Care, Joint mobilization, Dry Needling, Electrical stimulation, Cryotherapy, Moist heat, Taping, Biofeedback, Manual therapy, and Re-evaluation  PLAN FOR NEXT SESSION: core strength, gentle hip ROM and mobility,    Jakki L Devynn Hessler, PT 06/20/2022, 11:10 AM

## 2022-06-20 NOTE — Telephone Encounter (Signed)
Patient did not show for appointment.  Patient was called and PT left message to please call us back.  Russella Dar, PT 06/20/22 3:57 PM

## 2022-06-21 ENCOUNTER — Inpatient Hospital Stay (HOSPITAL_COMMUNITY)
Admission: AD | Admit: 2022-06-21 | Discharge: 2022-06-21 | Disposition: A | Discharge status: Home / Self Care | Payer: Medicaid Other | Attending: Obstetrics and Gynecology | Admitting: Obstetrics and Gynecology

## 2022-06-21 ENCOUNTER — Encounter (HOSPITAL_COMMUNITY): Payer: Self-pay | Admitting: Obstetrics and Gynecology

## 2022-06-21 DIAGNOSIS — R102 Pelvic and perineal pain: Secondary | ICD-10-CM | POA: Diagnosis not present

## 2022-06-21 DIAGNOSIS — R3989 Other symptoms and signs involving the genitourinary system: Secondary | ICD-10-CM

## 2022-06-21 DIAGNOSIS — O99612 Diseases of the digestive system complicating pregnancy, second trimester: Secondary | ICD-10-CM | POA: Diagnosis not present

## 2022-06-21 DIAGNOSIS — O26892 Other specified pregnancy related conditions, second trimester: Secondary | ICD-10-CM | POA: Insufficient documentation

## 2022-06-21 DIAGNOSIS — O99012 Anemia complicating pregnancy, second trimester: Secondary | ICD-10-CM | POA: Insufficient documentation

## 2022-06-21 DIAGNOSIS — Z3490 Encounter for supervision of normal pregnancy, unspecified, unspecified trimester: Secondary | ICD-10-CM

## 2022-06-21 DIAGNOSIS — Z3A14 14 weeks gestation of pregnancy: Secondary | ICD-10-CM | POA: Insufficient documentation

## 2022-06-21 LAB — CBC
HCT: 28.9 % — ABNORMAL LOW (ref 36.0–46.0)
Hemoglobin: 10 g/dL — ABNORMAL LOW (ref 12.0–15.0)
MCH: 25.9 pg — ABNORMAL LOW (ref 26.0–34.0)
MCHC: 34.6 g/dL (ref 30.0–36.0)
MCV: 74.9 fL — ABNORMAL LOW (ref 80.0–100.0)
Platelets: 244 10*3/uL (ref 150–400)
RBC: 3.86 MIL/uL — ABNORMAL LOW (ref 3.87–5.11)
RDW: 15.2 % (ref 11.5–15.5)
WBC: 6.7 10*3/uL (ref 4.0–10.5)
nRBC: 0 % (ref 0.0–0.2)

## 2022-06-21 LAB — URINALYSIS, ROUTINE W REFLEX MICROSCOPIC
Bilirubin Urine: NEGATIVE
Glucose, UA: NEGATIVE mg/dL
Hgb urine dipstick: NEGATIVE
Ketones, ur: NEGATIVE mg/dL
Nitrite: NEGATIVE
Protein, ur: NEGATIVE mg/dL
Specific Gravity, Urine: 1.018 (ref 1.005–1.030)
pH: 5 (ref 5.0–8.0)

## 2022-06-21 MED ORDER — CYCLOBENZAPRINE HCL 5 MG PO TABS
10.0000 mg | ORAL_TABLET | Freq: Once | ORAL | Status: AC
  Administered 2022-06-21: 10 mg via ORAL
  Filled 2022-06-21: qty 2

## 2022-06-21 MED ORDER — CYCLOBENZAPRINE HCL 10 MG PO TABS: 10.0000 mg | ORAL_TABLET | Freq: Two times a day (BID) | ORAL | 0 refills | Status: DC | PRN

## 2022-06-21 MED ORDER — ACETAMINOPHEN 500 MG PO TABS
1000.0000 mg | ORAL_TABLET | Freq: Once | ORAL | Status: AC
  Administered 2022-06-21: 1000 mg via ORAL
  Filled 2022-06-21: qty 2

## 2022-06-21 MED ORDER — CEFADROXIL 500 MG PO CAPS: 500.0000 mg | ORAL_CAPSULE | Freq: Two times a day (BID) | ORAL | 0 refills | Status: AC

## 2022-06-21 MED ORDER — CEFADROXIL 500 MG PO CAPS: 500.0000 mg | ORAL_CAPSULE | Freq: Two times a day (BID) | ORAL | 0 refills | Status: DC

## 2022-06-21 NOTE — MAU Provider Note (Addendum)
Chief Complaint:  Abdominal Pain   Event Date/Time   First Provider Initiated Contact with Patient 06/21/22 0749     HPI: Heidi Martin is a 21 y.o. G4P1021 at 75w0dho presents to maternity admissions reporting lower abdominal and periumbilical pain which started last night, then woke her up again at 4am.  No bleeding. No recent IC. .Marland KitchenShe denies LOF, vaginal bleeding, vaginal itching/burning, urinary symptoms, n/v, diarrhea, constipation or fever/chills.    History is remarkable for short cervix noted incidentally at 20 wks last pregnancy.  .  Abdominal Pain This is a new problem. The current episode started yesterday. The problem occurs constantly. The problem is unchanged. The pain is located in the periumbilical region and suprapubic region. The pain is at a severity of 9/10. The quality of the pain is described as cramping and sharp. The pain does not radiate. Pertinent negatives include no constipation, diarrhea, dysuria, fever, frequency, myalgias, nausea or vomiting. Nothing relieves the symptoms. Past treatments include nothing.   RN Note: Heidi WEYANDTis a 21y.o. at 180w0dere in MAU reporting: around 2000 last night she started having "contraction like" pain that came and went, took tylenol and was finally able to sleep. Then around 0430 this morning she woke up with sharp pain in her lower right quadrant, it has now moved back to the middle and is cramping in sensation. She also reports pain in her stomach when she voids, denies burning. Denies recent IC, no VB or abnormal discharge.  Pain score: 9  Past Medical History: Past Medical History:  Diagnosis Date   Alpha thalassemia silent carrier    COVID-19    Irritable bowel syndrome (IBS)    Sickle cell trait ([place]    Past obstetric history: OB History  Gravida Para Term Preterm AB Living  _0 SAB IAB Ectopic Multiple Live Births  1 1   0 1    # Outcome Date GA Lbr Len/2nd Weight Sex Delivery Anes PTL Lv  4  Current           3 SAB 10/2021          2 Term 11/30/20 [id number]:51 / 00:35 2605 g F Vag-Spont EPI  LIV  1 IAB 2021            Past Surgical History: Past Surgical History:  Procedure Laterality Date   DILATION AND EVACUATION N/A 10/27/2021   Procedure: DILATATION AND EVACUATION;  Surgeon: PicAletha HalimD;  [facility];  Laterality: N/A;    Family History: Family History  Problem Relation Age of Onset   Cancer Mother    Depression Mother    Miscarriages / Stillbirths Mother    Hypertension Mother    Hypertension Father    Cancer Maternal Grandmother    Diabetes Maternal Grandmother    Miscarriages / Stillbirths Maternal Grandmother     Social History: Social History   Tobacco Use   Smoking status: Never   Smokeless tobacco: Never  Vaping Use   Vaping Use: Never used  Substance Use Topics   Alcohol use: No   Drug use: No    Allergies:  Allergies  Allergen Reactions   Milk [Milk ([place]ow)] Nausea And Vomiting    Stomach cramping    Meds:  Medications Prior to Admission  Medication Sig Dispense Refill Last Dose   acetaminophen (TYLENOL) 500 MG tablet Take 500 mg by mouth every 6 (six)  hours as needed.   06/20/2022   Prenat-FeFum-FePo-FA-Omega 3 (CONCEPT DHA) 53.5-38-1 MG CAPS Take 1 tablet by mouth daily. 30 capsule 12 06/20/2022   Blood Pressure Monitoring (BLOOD PRESSURE KIT) DEVI 1 each by Does not apply route 3 times/day as needed-between meals & bedtime. 1 each 0    doxylamine, Sleep, (UNISOM) 25 MG tablet Take 1 tablet (25 mg total) by mouth at bedtime. 30 tablet 2    magnesium oxide (MAG-OX) 400 MG tablet Take 1 tablet (400 mg total) by mouth daily. 30 tablet 1    metoCLOPramide (REGLAN) 10 MG tablet Take 1 tablet (10 mg total) by mouth 4 (four) times daily as needed for nausea or vomiting. 30 tablet 2    Misc. Devices ([place]) MISC 1 each by Does not apply route as needed. 1 each 0    promethazine (PHENERGAN) 25 MG  tablet Take 1 tablet (25 mg total) by mouth every 6 (six) hours as needed for nausea or vomiting. 30 tablet 3    pyridOXINE (VITAMIN B6) 25 MG tablet 1 tablet po tid 30 tablet 3     I have reviewed patient's Past Medical Hx, Surgical Hx, Family Hx, Social Hx, medications and allergies.   ROS:  Review of Systems  Constitutional:  Negative for fever.  Gastrointestinal:  Positive for abdominal pain. Negative for constipation, diarrhea, nausea and vomiting.  Genitourinary:  Negative for dysuria and frequency.  Musculoskeletal:  Negative for myalgias.   Other systems negative  Physical Exam  Patient Vitals for the past 24 hrs:  BP Temp Temp src Pulse Resp Height Weight  06/21/22 0724 116/67 -- -- 75 -- -- --  06/21/22 0720 122/63 97.7 F (36.5 C) Oral 73 14 _0  (1.702 m) 80.6 kg   Constitutional: Well-developed, well-nourished female in no acute distress.  Cardiovascular: normal rate  Respiratory: normal effort GI: Abd soft, tender over umbilical area and lower abdomen, uterus is gravid, appropriate for gestational age.   No rebound or guarding. MS: Extremities nontender, no edema, normal ROM Neurologic: Alert and oriented x 4.  GU: Neg CVAT.  PELVIC EXAM:  Cervix firm, closed, long, posterior, neg CMT, uterus nontender, Fundal Height consistent with dates, adnexa without tenderness, enlargement, or mass  Dilation: Closed Effacement (%): Thick Station: Ballotable Exam by:: Hansel Feinstein, CNM  FHT:  152   Labs:  O/Positive/-- (12/11 1009)  Imaging:  No results found.  MAU Course/MDM: I have reviewed the triage vital signs and the nursing notes.   Pertinent labs & imaging results that were available during my care of the patient were reviewed by me and considered in my medical decision making (see chart for details).      I have reviewed her medical records including past results, notes and treatments.   I have ordered labs including CBC and UA  Care turned over to  [place], MSN Certified Nurse-Midwife 06/21/2022 7:58 AM  Care assumed from [facility]. [place], CNM @ [phone] Reassessment @ [phone] - Discussed results of UA and CBC. Pain unchanged. Offered pain medication - patient accepts pain medication. Orders for Flexeril 10 mg and Tylenol 1000 mg po entered. Reassessment @ 0920 - Pain resolved to 1/10  Assessment: [place] IUP at 57w0dLower abdominal and periumbilical pain History of short cervix with prior pregnancy  Plan: 1. Pelvic pain affecting pregnancy in second trimester, antepartum - Rx: Flexeril 10 mg BID prn pain - Advised to also take Tylenol with Flexeril prn  2. Suspected UTI - Rx: Cefadroxil 500 mg PO BID x 10 days - Information provided on UTI in pregnancy   3. [redacted] weeks gestation of pregnancy   - Discharge patient - Keep scheduled appt with [facility] on 07/12/2022 - Patient verbalized an understanding of the plan of care and agrees.   Laury Deep, CNM 06/21/2022 9:32 AM

## 2022-06-21 NOTE — Discharge Instructions (Signed)
You have prescriptions for the muscle relaxer and antibiotic for possible urinary tract infection. I advise you to take the antibiotic since you have symptoms that are consistent with urinary tract infection.

## 2022-06-21 NOTE — MAU Note (Signed)
.  Heidi Martin is a 21 y.o. at [redacted]w[redacted]d here in MAU reporting: around 2000 last night she started having "contraction like" pain that came and went, took tylenol and was finally able to sleep. Then around 0430 this morning she woke up with sharp pain in her lower right quadrant, it has now moved back to the middle and is cramping in sensation. She also reports pain in her stomach when she voids, denies burning. Denies recent IC, no VB or abnormal discharge.  Pain score: 9 Vitals:   06/21/22 0720  BP: 122/63  Pulse: 73  Resp: 14  Temp: 97.7 F (36.5 C)     FHT:152 Lab orders placed from triage:  UA

## 2022-06-23 ENCOUNTER — Encounter: Payer: Self-pay | Admitting: Obstetrics and Gynecology

## 2022-06-28 ENCOUNTER — Encounter: Payer: Self-pay | Admitting: Emergency Medicine

## 2022-07-03 NOTE — L&D Delivery Note (Signed)
OB/GYN Faculty Practice Delivery Note  Heidi Martin is a 22 y.o. [id number] s/p SVD at [redacted]w[redacted]d. She was admitted for SOL.   ROM: 2h 67m with clear fluid GBS Status:  Positive/-- (05/16 0758) Maximum Maternal Temperature:  Temp (24hrs), Avg:98 F (36.7 C), Min:97.5 F (36.4 C), Max:98.5 F (36.9 C)    Labor Progress: Patient arrived at 6 cm dilation and was augmented with AROM and pitocin.   Delivery Date/Time: 12/02/2022 at 1701 Delivery: Called to room and patient was complete and pushing. Head delivered in ROA position. No nuchal cord present. Shoulder and body delivered in usual fashion. Infant with spontaneous cry, placed on mother's abdomen, dried and stimulated. Cord clamped x 2 after 1-minute delay, and cut by FOB. Cord blood drawn. Placenta delivered spontaneously with gentle cord traction. Fundus firm with massage and Pitocin. Labia, perineum, vagina, and cervix inspected with no lacerations.   Placenta:  spontaneous, intact, 3 vessel cord  Complications: None Lacerations: None EBL: 157 mL Analgesia: epidural    Infant: APGAR (1 MIN): 9   APGAR (5 MINS): 9   APGAR (10 MINS):    Weight: 3400 g  Derrel Nip, MD  OB Fellow  12/02/2022 6:20 PM

## 2022-07-06 NOTE — Therapy (Deleted)
OUTPATIENT PHYSICAL THERAPY FEMALE PELVIC TREATMENT   Patient Name: Heidi Martin MRN: [id number] DOB:2001/01/08, 22 y.o., female Today's Date: 07/06/2022  END OF SESSION:    Past Medical History:  Diagnosis Date   Alpha thalassemia silent carrier    COVID-19    Irritable bowel syndrome (IBS)    Sickle cell trait ([place])    Past Surgical History:  Procedure Laterality Date   DILATION AND EVACUATION N/A 10/27/2021   Procedure: DILATATION AND EVACUATION;  Surgeon: Aletha Halim, MD;  Location: [facility];  Service: Gynecology;  Laterality: N/A;   Patient Active Problem List   Diagnosis Date Noted   LSIL pap 06/16/2022   Alpha thalassemia silent carrier 06/12/2022   Carrier of spinal muscular atrophy 06/12/2022   Bipolar disorder & ADHD 06/12/2022   Migraine headache 06/12/2022   History of FGR 06/12/2022   Supervision of low-risk pregnancy 06/01/2022   Nausea/vomiting in pregnancy 05/08/2022   Sickle cell trait ([place]) 05/31/2015    PCP: Kristie Cowman, MD   REFERRING PROVIDER: Inez Catalina, MD   REFERRING DIAG: [phone] (ICD-10-CM) - Pelvic pain affecting pregnancy in first trimester, antepartum   THERAPY DIAG:  No diagnosis found.  Rationale for Evaluation and Treatment: Rehabilitation  ONSET DATE: during last pregnancy  SUBJECTIVE:                                                                                                                                                                                           SUBJECTIVE STATEMENT: When I walk my hips are grinding together.  It starting in last pregnancy and got to where I couldn't walking.  It hurts at night when sleeping Fluid intake: Yes: water regular    PAIN:  Are you having pain? Yes NPRS scale: 5/10 Pain location:  anterior pubic bone  Pain type: grinding Pain description: intermittent and aching   Aggravating factors: walking Relieving factors: sitting  PRECAUTIONS: None  WEIGHT  BEARING RESTRICTIONS: No  FALLS:  Has patient fallen in last 6 months? 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  PLOF: Independent  PATIENT GOALS: being able to walk  PERTINENT HISTORY:  IBS, [redacted] weeks pregnant, pain since previous pregnancy Sexual abuse: No  BOWEL MOVEMENT: Pain with bowel movement: Yes Type of bowel movement:Type (Bristol Stool Scale) constipated, Frequency less often than normal, and Strain Yes Fiber supplement: No  URINATION: Pain with urination: No Fully empty bladder: No Stream: Strong Leakage:  no Pads: No  INTERCOURSE: Pain with intercourse: During Penetration and After Intercourse Ability  to have vaginal penetration:  Yes: it just hurts Climax: No Marinoff Scale: 2/3  PREGNANCY: Vaginal deliveries 1 Tearing Yes:     PROLAPSE: None   OBJECTIVE:   DIAGNOSTIC FINDINGS:    PATIENT SURVEYS:    PFIQ-7 = 57  COGNITION: Overall cognitive status: Within functional limits for tasks assessed     SENSATION:   MUSCLE LENGTH: Hamstrings: painful to attempt   LUMBAR SPECIAL TESTS:    FUNCTIONAL TESTS:  Single leg stand  GAIT:  Comments: short step length and lateral shift, slower gait speed  POSTURE: increased lumbar lordosis and anterior pelvic tilt  PELVIC ALIGNMENT: normal  LUMBARAROM/PROM:  A/PROM A/PROM  eval  Flexion WFL +P  Extension WFL   Right lateral flexion WFL   Left lateral flexion WFL   Right rotation WFL   Left rotation WFL    (Blank rows = not tested)  LOWER EXTREMITY ROM:  Passive ROM Right eval Left eval  Hip flexion 60% +P 50% +P  Hip extension    Hip abduction    Hip adduction    Hip internal rotation [facility]  WFL   Hip external rotation 50% 50% +P  Knee flexion    Knee extension    Ankle dorsiflexion    Ankle plantarflexion    Ankle inversion    Ankle eversion     (Blank rows = not  tested)  LOWER EXTREMITY MMT:  MMT Right eval Left eval  Hip flexion 4/5 3/5 +P  Hip extension    Hip abduction    Hip adduction    Hip internal rotation 5/5 3/5+P  Hip external rotation 5/5 4/5  Knee flexion    Knee extension    Ankle dorsiflexion    Ankle plantarflexion    Ankle inversion    Ankle eversion     PALPATION:   General  guarded and more pain in hooklying than LE straight, tender to palpation hip flexors, pelvic compression increases pain                External Perineal Exam tender to palpation pubic symphasis, adductors, ischiocavernosis and bulbocavernosis                             Internal Pelvic Floor just palpation of introitus was painful  Patient confirms identification and approves PT to assess internal pelvic floor and treatment Yes  PELVIC MMT:   MMT eval  Vaginal   Internal Anal Sphincter   External Anal Sphincter   Puborectalis   Diastasis Recti   (Blank rows = not tested)        TONE: high  PROLAPSE: no  TODAY'S TREATMENT:                                                                                                                              DATE: 06/16/22  EVAL and educated on initial HEP stretch  PATIENT EDUCATION:  Education details: Access Code: [email or web address] Person educated: Patient Education method: Explanation, Demonstration, Tactile cues, Verbal cues, and Handouts Education comprehension: verbalized understanding and returned demonstration  HOME EXERCISE PROGRAM: Access Code: [email or web address] URL: [email or web address] Date: 06/16/2022 Prepared by: Jari Favre  Exercises - Standing Lumbar Extension at [place]  - 1 x daily - 7 x weekly - 3 sets - 10 reps  ASSESSMENT:  CLINICAL IMPRESSION: Patient is a 22 y.o. female who was seen today for physical therapy evaluation and treatment for pelvic pain during pregnancy.   This is patient's 2nd pregnancy and she has had this pain in the past. Pt  also has history of constipation and dyspareunia since she can remember . Pt has very high tone pelvic floor and increased pain with pelvic compression.  Adductors are tight and tender to palpation.  Left hip ROM produces pain and tight bilaterally.  Pt has tight and tender hip flexors.  Gluteals not tight with hip pain and weakness with MMT bilaterally.  Pt has more pain with movements producing pelvic rotation.  Very slow gait with short stride length and lateal weight shifting.  Pt will benefit from skilled PT to address impairments and improve function for healthy pregnancy.  OBJECTIVE IMPAIRMENTS: difficulty walking, decreased ROM, decreased strength, increased muscle spasms, impaired flexibility, impaired tone, postural dysfunction, and pain.   ACTIVITY LIMITATIONS: squatting, sleeping, and toileting  PARTICIPATION LIMITATIONS: shopping, community activity, and occupation  PERSONAL FACTORS: Time since onset of injury/illness/exacerbation and 1-2 comorbidities: currently pregnant, previous SVD with tearing  are also affecting patient's functional outcome.   REHAB POTENTIAL: Excellent  CLINICAL DECISION MAKING: Evolving/moderate complexity  EVALUATION COMPLEXITY: Moderate   GOALS: Goals reviewed with patient? Yes  SHORT TERM GOALS: Target date: 07/14/22  Ind with stretches and initial HEP for pain management Baseline: Goal status: INITIAL  LONG TERM GOALS: Target date: 09/08/2022  Pt will be independent with advanced HEP to maintain improvements made throughout therapy  Baseline:  Goal status: INITIAL  2.  Pt will report 75% reduction of pain due to improvements in posture, strength, and muscle length  Baseline: 5/10 pain Goal status: INITIAL  3.  Pt will be able to walk for at least 30 minutes to do work related activities without increased pain Baseline: walking any distance causes pain Goal status: INITIAL  4.  Pt will be able to bend and lift without pain for improved  functional and work related activities Baseline: increased pain with forward flexion Goal status: INITIAL  5.  Pt will improve bowel movements with less straining due to understanding proper toileting techniques and diaphragmatic breathing Baseline: has to strain Goal status: INITIAL    PLAN:  PT FREQUENCY: 1x/week  PT DURATION: 12 weeks  PLANNED INTERVENTIONS: Therapeutic exercises, Therapeutic activity, Neuromuscular re-education, Balance training, Gait training, Patient/Family education, Self Care, Joint mobilization, Dry Needling, Electrical stimulation, Cryotherapy, Moist heat, Taping, Biofeedback, Manual therapy, and Re-evaluation  PLAN FOR NEXT SESSION: core strength, gentle hip ROM and mobility,    Jakki L Treasa Bradshaw, PT 07/06/2022, 3:13 PM

## 2022-07-07 ENCOUNTER — Telehealth: Payer: Self-pay | Admitting: Family Medicine

## 2022-07-07 ENCOUNTER — Telehealth: Payer: Self-pay | Admitting: Physical Therapy

## 2022-07-07 ENCOUNTER — Ambulatory Visit: Payer: Medicaid Other | Attending: Obstetrics and Gynecology | Admitting: Physical Therapy

## 2022-07-07 DIAGNOSIS — F32A Depression, unspecified: Secondary | ICD-10-CM | POA: Insufficient documentation

## 2022-07-07 DIAGNOSIS — O99340 Other mental disorders complicating pregnancy, unspecified trimester: Secondary | ICD-10-CM | POA: Insufficient documentation

## 2022-07-07 DIAGNOSIS — M6281 Muscle weakness (generalized): Secondary | ICD-10-CM | POA: Insufficient documentation

## 2022-07-07 DIAGNOSIS — R293 Abnormal posture: Secondary | ICD-10-CM | POA: Insufficient documentation

## 2022-07-07 NOTE — Telephone Encounter (Signed)
Returned patients call. Patient did not answer. LM for her to call the office if concerns continue.

## 2022-07-07 NOTE — Telephone Encounter (Signed)
Patient called with pain in breast and scabbing on nipples and would like a nurse to give a call back to see what she can do.

## 2022-07-07 NOTE — Telephone Encounter (Signed)
Patient did not show for appointment.  Patient was called and PT left message to please call us back.  Reminded that 2 no shows and remaining visits canceled.  Call back if needing more PT.  Gustavus Bryant, PT, DPT 07/07/22 11:31 AM

## 2022-07-11 ENCOUNTER — Telehealth: Payer: Self-pay

## 2022-07-11 NOTE — Telephone Encounter (Signed)
Called Pt to advise of Centering tomorrow 07/12/22 @ 9a,Pt verbalized understanding.

## 2022-07-12 ENCOUNTER — Other Ambulatory Visit (HOSPITAL_COMMUNITY)
Admission: RE | Admit: 2022-07-12 | Discharge: 2022-07-12 | Disposition: A | Discharge status: Home / Self Care | Payer: Medicaid Other | Source: Ambulatory Visit | Attending: Certified Nurse Midwife | Admitting: Certified Nurse Midwife

## 2022-07-12 ENCOUNTER — Other Ambulatory Visit: Payer: Self-pay

## 2022-07-12 ENCOUNTER — Encounter: Payer: Medicaid Other | Admitting: Physical Therapy

## 2022-07-12 ENCOUNTER — Ambulatory Visit (INDEPENDENT_AMBULATORY_CARE_PROVIDER_SITE_OTHER): Payer: Medicaid Other | Admitting: Certified Nurse Midwife

## 2022-07-12 VITALS — BP 118/69 | HR 86 | Wt 174.6 lb

## 2022-07-12 DIAGNOSIS — O26892 Other specified pregnancy related conditions, second trimester: Secondary | ICD-10-CM

## 2022-07-12 DIAGNOSIS — Z3492 Encounter for supervision of normal pregnancy, unspecified, second trimester: Secondary | ICD-10-CM

## 2022-07-12 DIAGNOSIS — R109 Unspecified abdominal pain: Secondary | ICD-10-CM

## 2022-07-12 DIAGNOSIS — B9689 Other specified bacterial agents as the cause of diseases classified elsewhere: Secondary | ICD-10-CM

## 2022-07-12 DIAGNOSIS — Z3A17 17 weeks gestation of pregnancy: Secondary | ICD-10-CM

## 2022-07-12 DIAGNOSIS — N760 Acute vaginitis: Secondary | ICD-10-CM

## 2022-07-13 ENCOUNTER — Other Ambulatory Visit: Payer: Self-pay

## 2022-07-13 DIAGNOSIS — Z3492 Encounter for supervision of normal pregnancy, unspecified, second trimester: Secondary | ICD-10-CM

## 2022-07-13 LAB — CERVICOVAGINAL ANCILLARY ONLY
Bacterial Vaginitis (gardnerella): POSITIVE — AB
Candida Glabrata: NEGATIVE
Candida Vaginitis: NEGATIVE
Chlamydia: NEGATIVE
Comment: NEGATIVE
Comment: NEGATIVE
Comment: NEGATIVE
Comment: NEGATIVE
Comment: NEGATIVE
Comment: NORMAL
Neisseria Gonorrhea: NEGATIVE
Trichomonas: NEGATIVE

## 2022-07-13 NOTE — Progress Notes (Addendum)
   PRENATAL VISIT NOTE  Subjective:  Heidi Martin is a 22 y.o. [phone] at [redacted]w[redacted]d being seen today for ongoing prenatal care.  She is currently monitored for the following issues for this low-risk pregnancy and has Sickle cell trait ([place]); Nausea/vomiting in pregnancy; Supervision of low-risk pregnancy; Alpha thalassemia silent carrier; Carrier of spinal muscular atrophy; Bipolar disorder & ADHD; Migraine headache; History of FGR; and LSIL pap on their problem list.  Patient reports  continued cramping, aggravated somewhat by movement but just feels a lot of cramping throughout the day and night. Denies vaginal bleeding or discharge. Notes nothing has changed since her MAU visits for same .  Contractions: Not present. Vag. Bleeding: None.  Movement: Present. Denies leaking of fluid.   The following portions of the patient's history were reviewed and updated as appropriate: allergies, current medications, past family history, past medical history, past social history, past surgical history and problem list.   Objective:   Vitals:   07/12/22 0919  BP: 118/69  Pulse: 86  Weight: 174 lb 9.6 oz (79.2 kg)   Fetal Status: Fetal Heart Rate (bpm): 150   Movement: Present     General:  Alert, oriented and cooperative. Patient is in no acute distress.  Skin: Skin is warm and dry. No rash noted.   Cardiovascular: Normal heart rate noted  Respiratory: Normal respiratory effort, no problems with respiration noted  Abdomen: Soft, gravid, appropriate for gestational age.  Pain/Pressure: Present     Pelvic: Cervical exam deferred        Extremities: Normal range of motion.  Edema: None  Mental Status: Normal mood and affect. Normal behavior. Normal judgment and thought content.   Assessment and Plan:  Pregnancy: [id number] at [redacted]w[redacted]d 1. Encounter for supervision of low-risk pregnancy in second trimester - Doing well, feeling flutters of fetal movement  2. [redacted] weeks gestation of pregnancy - Routine OB  care   3. Abdominal pain in pregnancy, second trimester - Has been seen in MAU for this, diagnosed with a UTI and completed treatment. Cramping remains. Suggested a vaginal swab to make sure she does not have BV/yeast. - Cervicovaginal ancillary only( [place])    Centering Pregnancy, Session#1:  - 3 Centering Breaths - Introduction to model of care - Reviewed group rules and CNM rules for pregnancy health - Encouraged to journal/record progress in notebook - Facilitated discussion on daily stretches for pregnancy health - Created group text thread   Preterm labor symptoms and general obstetric precautions including but not limited to vaginal bleeding, contractions, leaking of fluid and fetal movement were reviewed in detail with the patient. Please refer to After Visit Summary for other counseling recommendations.   Return in about 4 weeks (around 08/09/2022) for IN-PERSON, LOB, [place].  Future Appointments  Date Time Provider [place]  07/26/2022  2:45 PM [facility] US4 [facility]  08/09/2022  9:00 AM CENTERING PROVIDER [facility]  09/06/2022  9:00 AM CENTERING PROVIDER [facility]  10/04/2022  9:00 AM CENTERING PROVIDER [facility]  10/18/2022  9:00 AM CENTERING PROVIDER [facility]  11/01/2022  9:00 AM CENTERING PROVIDER [facility]  11/15/2022  9:00 AM CENTERING PROVIDER [facility]  11/29/2022  9:00 AM CENTERING PROVIDER [facility]  12/13/2022  9:00 AM CENTERING PROVIDER [facility]  12/27/2022  9:00 AM CENTERING PROVIDER [facility] [place] Rhonin Trott, CNM

## 2022-07-14 ENCOUNTER — Telehealth: Payer: Self-pay | Admitting: Family Medicine

## 2022-07-14 NOTE — Telephone Encounter (Signed)
Called patient to get her on the lab schedule as soon as possible for AFP draw. There was no answer to the phone call so a voicemail was left with the call back number for the office.  

## 2022-07-17 ENCOUNTER — Telehealth: Payer: Self-pay | Admitting: Family Medicine

## 2022-07-17 NOTE — Telephone Encounter (Signed)
Called patient to schedule AFP lab appointment as soon as possible. There was no answer to the phone call so a voicemail was left with the call back number for the office.

## 2022-07-18 ENCOUNTER — Encounter: Payer: Medicaid Other | Admitting: Physical Therapy

## 2022-07-24 ENCOUNTER — Other Ambulatory Visit: Payer: Self-pay | Admitting: General Practice

## 2022-07-24 DIAGNOSIS — Z148 Genetic carrier of other disease: Secondary | ICD-10-CM

## 2022-07-24 DIAGNOSIS — Z3492 Encounter for supervision of normal pregnancy, unspecified, second trimester: Secondary | ICD-10-CM

## 2022-07-24 MED ORDER — METRONIDAZOLE 500 MG PO TABS: 500.0000 mg | ORAL_TABLET | Freq: Two times a day (BID) | ORAL | 0 refills | Status: DC

## 2022-07-24 NOTE — Addendum Note (Signed)
Addended by: Gaylan Gerold on: 07/24/2022 08:57 PM   Modules accepted: Orders

## 2022-07-26 ENCOUNTER — Encounter: Payer: Medicaid Other | Admitting: Physical Therapy

## 2022-07-26 ENCOUNTER — Ambulatory Visit: Payer: Medicaid Other | Admitting: *Deleted

## 2022-07-26 ENCOUNTER — Other Ambulatory Visit: Payer: Self-pay | Admitting: Certified Nurse Midwife

## 2022-07-26 ENCOUNTER — Ambulatory Visit: Payer: Medicaid Other | Attending: Obstetrics and Gynecology

## 2022-07-26 ENCOUNTER — Encounter: Payer: Self-pay | Admitting: *Deleted

## 2022-07-26 VITALS — BP 119/56 | HR 69

## 2022-07-26 DIAGNOSIS — Z148 Genetic carrier of other disease: Secondary | ICD-10-CM

## 2022-07-26 DIAGNOSIS — Z363 Encounter for antenatal screening for malformations: Secondary | ICD-10-CM | POA: Diagnosis not present

## 2022-07-26 DIAGNOSIS — O285 Abnormal chromosomal and genetic finding on antenatal screening of mother: Secondary | ICD-10-CM

## 2022-07-26 DIAGNOSIS — D563 Thalassemia minor: Secondary | ICD-10-CM

## 2022-07-26 DIAGNOSIS — Z3689 Encounter for other specified antenatal screening: Secondary | ICD-10-CM | POA: Diagnosis not present

## 2022-07-26 DIAGNOSIS — O09299 Supervision of pregnancy with other poor reproductive or obstetric history, unspecified trimester: Secondary | ICD-10-CM | POA: Insufficient documentation

## 2022-07-26 DIAGNOSIS — Z3492 Encounter for supervision of normal pregnancy, unspecified, second trimester: Secondary | ICD-10-CM | POA: Insufficient documentation

## 2022-07-26 DIAGNOSIS — Z3A19 19 weeks gestation of pregnancy: Secondary | ICD-10-CM | POA: Diagnosis not present

## 2022-07-27 ENCOUNTER — Other Ambulatory Visit: Payer: Self-pay | Admitting: *Deleted

## 2022-07-27 DIAGNOSIS — D563 Thalassemia minor: Secondary | ICD-10-CM

## 2022-07-27 DIAGNOSIS — Z8759 Personal history of other complications of pregnancy, childbirth and the puerperium: Secondary | ICD-10-CM

## 2022-07-27 DIAGNOSIS — D573 Sickle-cell trait: Secondary | ICD-10-CM

## 2022-08-02 ENCOUNTER — Encounter: Payer: Medicaid Other | Admitting: Physical Therapy

## 2022-08-09 ENCOUNTER — Encounter: Payer: Medicaid Other | Admitting: Certified Nurse Midwife

## 2022-08-10 ENCOUNTER — Encounter (HOSPITAL_COMMUNITY): Payer: Self-pay | Admitting: Obstetrics and Gynecology

## 2022-08-10 ENCOUNTER — Inpatient Hospital Stay (HOSPITAL_COMMUNITY)
Admission: AD | Admit: 2022-08-10 | Discharge: 2022-08-10 | Disposition: A | Discharge status: Home / Self Care | Payer: Medicaid Other | Attending: Obstetrics and Gynecology | Admitting: Obstetrics and Gynecology

## 2022-08-10 ENCOUNTER — Inpatient Hospital Stay (HOSPITAL_BASED_OUTPATIENT_CLINIC_OR_DEPARTMENT_OTHER): Payer: Medicaid Other

## 2022-08-10 ENCOUNTER — Ambulatory Visit: Payer: Medicaid Other

## 2022-08-10 DIAGNOSIS — D573 Sickle-cell trait: Secondary | ICD-10-CM

## 2022-08-10 DIAGNOSIS — Z3A21 21 weeks gestation of pregnancy: Secondary | ICD-10-CM

## 2022-08-10 DIAGNOSIS — R110 Nausea: Secondary | ICD-10-CM | POA: Insufficient documentation

## 2022-08-10 DIAGNOSIS — R109 Unspecified abdominal pain: Secondary | ICD-10-CM | POA: Diagnosis not present

## 2022-08-10 DIAGNOSIS — O26892 Other specified pregnancy related conditions, second trimester: Secondary | ICD-10-CM | POA: Diagnosis present

## 2022-08-10 DIAGNOSIS — R102 Pelvic and perineal pain: Secondary | ICD-10-CM | POA: Diagnosis not present

## 2022-08-10 DIAGNOSIS — Z8616 Personal history of COVID-19: Secondary | ICD-10-CM | POA: Diagnosis not present

## 2022-08-10 DIAGNOSIS — Z148 Genetic carrier of other disease: Secondary | ICD-10-CM | POA: Diagnosis not present

## 2022-08-10 DIAGNOSIS — O4592 Premature separation of placenta, unspecified, second trimester: Secondary | ICD-10-CM | POA: Diagnosis not present

## 2022-08-10 LAB — URINALYSIS, ROUTINE W REFLEX MICROSCOPIC
Bacteria, UA: NONE SEEN
Bilirubin Urine: NEGATIVE
Glucose, UA: NEGATIVE mg/dL
Hgb urine dipstick: NEGATIVE
Ketones, ur: NEGATIVE mg/dL
Nitrite: NEGATIVE
Protein, ur: NEGATIVE mg/dL
Specific Gravity, Urine: 1.011 (ref 1.005–1.030)
pH: 7 (ref 5.0–8.0)

## 2022-08-10 LAB — GC/CHLAMYDIA PROBE AMP (~~LOC~~) NOT AT ARMC
Chlamydia: NEGATIVE
Comment: NEGATIVE
Comment: NORMAL
Neisseria Gonorrhea: NEGATIVE

## 2022-08-10 LAB — WET PREP, GENITAL
Clue Cells Wet Prep HPF POC: NONE SEEN
Sperm: NONE SEEN
Trich, Wet Prep: NONE SEEN
WBC, Wet Prep HPF POC: >=10 — AB (ref <10)
Yeast Wet Prep HPF POC: NONE SEEN

## 2022-08-10 MED ORDER — FENTANYL CITRATE (PF) 100 MCG/2ML IJ SOLN
100.0000 ug | Freq: Once | INTRAMUSCULAR | Status: AC
  Administered 2022-08-10: 100 ug via INTRAVENOUS
  Filled 2022-08-10: qty 2

## 2022-08-10 MED ORDER — INDOMETHACIN 25 MG PO CAPS: 25.0000 mg | ORAL_CAPSULE | Freq: Four times a day (QID) | ORAL | 0 refills | Status: AC

## 2022-08-10 MED ORDER — INDOMETHACIN 25 MG PO CAPS
50.0000 mg | ORAL_CAPSULE | Freq: Once | ORAL | Status: AC
  Administered 2022-08-10: 50 mg via ORAL
  Filled 2022-08-10: qty 2

## 2022-08-10 MED ORDER — DIPHENHYDRAMINE HCL 50 MG/ML IJ SOLN
25.0000 mg | Freq: Once | INTRAMUSCULAR | Status: AC
  Administered 2022-08-10: 25 mg via INTRAVENOUS
  Filled 2022-08-10: qty 1

## 2022-08-10 MED ORDER — LACTATED RINGERS IV BOLUS
1000.0000 mL | Freq: Once | INTRAVENOUS | Status: AC
  Administered 2022-08-10: 1000 mL via INTRAVENOUS

## 2022-08-10 NOTE — Discharge Instructions (Signed)
Return for worsening abdominal pain or bright red vaginal bleeding

## 2022-08-10 NOTE — MAU Provider Note (Signed)
History     [id number]  Arrival date and time: 08/10/22 4098    Chief Complaint  Patient presents with   Contractions     HPI Heidi Martin is a 22 y.o. at [redacted]w[redacted]d who presents via EMS for abdominal pain.  Reports intermittent abdominal pain started yesterday at 3 PM but worsened this morning at 1 AM.  States pain feels like contractions and occurs every few minutes.  Last bowel movement was this morning and was normal.  Has noticed an increase in vaginal discharge.  Symptoms associated with nausea.  Denies fever, vomiting, diarrhea, constipation, dysuria, vaginal bleeding, leaking of fluid.  No recent intercourse.  History of short cervix and last pregnancy, delivered at term.  OB History     Gravida  4   Para  1   Term  1   Preterm      AB  2   Living  1      SAB  1   IAB  1   Ectopic      Multiple  0   Live Births  1           Past Medical History:  Diagnosis Date   Alpha thalassemia silent carrier    COVID-19    Irritable bowel syndrome (IBS)    Sickle cell trait ([place])     Past Surgical History:  Procedure Laterality Date   DILATION AND EVACUATION N/A 10/27/2021   Procedure: DILATATION AND EVACUATION;  Surgeon: Aletha Halim, MD;  Location: [place];  Service: Gynecology;  Laterality: N/A;    Family History  Problem Relation Age of Onset   Cancer Mother    Depression Mother    Miscarriages / Stillbirths Mother    Hypertension Mother    Hypertension Father    Cancer Maternal Grandmother    Diabetes Maternal Grandmother    Miscarriages / Stillbirths Maternal Grandmother     Allergies  Allergen Reactions   Milk [Milk ([place])] Nausea And Vomiting    Stomach cramping    No current facility-administered medications on file prior to encounter.   Current Outpatient Medications on File Prior to Encounter  Medication Sig Dispense Refill   acetaminophen (TYLENOL) 500 MG tablet Take 500 mg by mouth every 6 (six) hours as needed.      Prenat-FeFum-FePo-FA-Omega 3 (CONCEPT DHA) 53.5-38-1 MG CAPS Take 1 tablet by mouth daily. 30 capsule 12   Blood Pressure Monitoring (BLOOD PRESSURE KIT) DEVI 1 each by Does not apply route 3 times/day as needed-between meals & bedtime. 1 each 0   cyclobenzaprine (FLEXERIL) 10 MG tablet Take 1 tablet (10 mg total) by mouth 2 (two) times daily as needed for muscle spasms. (Patient not taking: Reported on 07/12/2022) 20 tablet 0   doxylamine, Sleep, (UNISOM) 25 MG tablet Take 1 tablet (25 mg total) by mouth at bedtime. 30 tablet 2   magnesium oxide (MAG-OX) 400 MG tablet Take 1 tablet (400 mg total) by mouth daily. (Patient not taking: Reported on 07/12/2022) 30 tablet 1   metoCLOPramide (REGLAN) 10 MG tablet Take 1 tablet (10 mg total) by mouth 4 (four) times daily as needed for nausea or vomiting. (Patient not taking: Reported on 07/12/2022) 30 tablet 2   metroNIDAZOLE (FLAGYL) 500 MG tablet Take 1 tablet (500 mg total) by mouth 2 (two) times daily. 14 tablet 0   Misc. Devices ([place]) MISC 1 each by Does not apply route as needed. 1 each 0   promethazine (PHENERGAN) 25  MG tablet Take 1 tablet (25 mg total) by mouth every 6 (six) hours as needed for nausea or vomiting. 30 tablet 3   pyridOXINE (VITAMIN B6) 25 MG tablet 1 tablet po tid 30 tablet 3   [DISCONTINUED] dicyclomine (BENTYL) 20 MG tablet Take 1 tablet (20 mg total) by mouth 2 (two) times daily. (Patient not taking: No sig reported) 20 tablet 0     ROS Pertinent positives and negative per HPI, all others reviewed and negative  Physical Exam   BP (!) 121/54   Pulse 78   Temp 97.7 F (36.5 C) (Oral)   Resp 16   LMP 03/24/2022 (Exact Date)   SpO2 99%   Patient Vitals for the past 24 hrs:  BP Temp Temp src Pulse Resp SpO2  08/10/22 0532 (!) 121/54 -- -- 78 -- 99 %  08/10/22 0529 (!) 121/54 97.7 F (36.5 C) Oral 77 16 100 %    Physical Exam Vitals and nursing note reviewed. Exam conducted with a chaperone present.   Constitutional:      General: She is in acute distress.     Appearance: Normal appearance. She is not ill-appearing, toxic-appearing or diaphoretic.  HENT:     Head: Normocephalic and atraumatic.  Eyes:     General: No scleral icterus.    Conjunctiva/sclera: Conjunctivae normal.  Pulmonary:     Effort: Pulmonary effort is normal. No respiratory distress.  Abdominal:     Palpations: Abdomen is soft.     Tenderness: There is no abdominal tenderness. There is no guarding or rebound.     Comments: Gravid  Genitourinary:    Comments: Pelvic: NEFG, no bloody, small amount of creamy white discharge. Cervix pink/smooth/not friable Skin:    General: Skin is warm and dry.  Neurological:     Mental Status: She is alert.      Cervical Exam Dilation: Closed Exam by:: Jorje Guild, NP    Labs Results for orders placed or performed during the hospital encounter of 08/10/22 (from the past 24 hour(s))  Wet prep, genital     Status: Abnormal   Collection Time: 08/10/22  5:32 AM   Specimen: PATH Cytology Cervicovaginal Ancillary Only  Result Value Ref Range   Yeast Wet Prep HPF POC NONE SEEN NONE SEEN   Trich, Wet Prep NONE SEEN NONE SEEN   Clue Cells Wet Prep HPF POC NONE SEEN NONE SEEN   WBC, Wet Prep HPF POC >=10 (A) <10   Sperm NONE SEEN   Urinalysis, Routine w reflex microscopic -Urine, Clean Catch     Status: Abnormal   Collection Time: 08/10/22  5:59 AM  Result Value Ref Range   Color, Urine YELLOW YELLOW   APPearance CLOUDY (A) CLEAR   Specific Gravity, Urine 1.011 1.005 - 1.030   pH 7.0 5.0 - 8.0   Glucose, UA NEGATIVE NEGATIVE mg/dL   Hgb urine dipstick NEGATIVE NEGATIVE   Bilirubin Urine NEGATIVE NEGATIVE   Ketones, ur NEGATIVE NEGATIVE mg/dL   Protein, ur NEGATIVE NEGATIVE mg/dL   Nitrite NEGATIVE NEGATIVE   Leukocytes,Ua TRACE (A) NEGATIVE   RBC / HPF 0-5 0 - 5 RBC/hpf   WBC, UA 6-10 0 - 5 WBC/hpf   Bacteria, UA NONE SEEN NONE SEEN   Squamous Epithelial / HPF 0-5  0 - 5 /HPF    Imaging No results found.  MAU Course  Procedures Lab Orders         Wet prep, genital  Culture, OB Urine         Urinalysis, Routine w reflex microscopic -Urine, Clean Catch     Meds ordered this encounter  Medications   lactated ringers bolus 1,000 mL   fentaNYL (SUBLIMAZE) injection 100 mcg   diphenhydrAMINE (BENADRYL) injection 25 mg   indomethacin (INDOCIN) capsule 50 mg   indomethacin (INDOCIN) 25 MG capsule    Sig: Take 1 capsule (25 mg total) by mouth every 6 (six) hours for 3 days.    Dispense:  12 capsule    Refill:  0    Order Specific Question:   Supervising Provider    Answer:   CONSTANT, [place]   Imaging Orders         [place] MFM OB LIMITED      MDM FHT present via doppler  Reviewed chart.  History of short cervix found incidentally at 19 weeks during her last pregnancy; she delivered at term.  Had negative cervicovaginal ancillary swab last month.  Will recollect today due to new onset of changes and vaginal discharge.  Interventions in MAU included IV fluid bolus, and fentanyl. (Given benadryl due to mild itching after fentanyl).  Patient reports some improvement in symptoms after meds but continues to have cramping.   Limited ultrasound shows normal cervical length. Shows area 4.7 x 1.5 x 3.6 near placental edge, subchorionic hemorrhage vs placental abruption.   Consulted with Dr. Elly Modena. Patient is previable & is currently stable. Will d/c home with 3 day course of indomethacin & closed f/u in the office.  Assessment and Plan   1. Placental abruption in second trimester   2. [redacted] weeks gestation of pregnancy    -Rx indomethacine 25 Q6 hrs x 72 hours -Reviewed bleeding & PTL precautions & reasons to return to MAU -Message sent to office for f/u next week with MD  Jorje Guild, NP 08/10/22 8:32 AM

## 2022-08-10 NOTE — MAU Note (Signed)
Heidi Martin is a 22 y.o. at [redacted]w[redacted]d here in MAU reporting: by EMS for CTX. Pt states the ctx started at 1500 yesterday. Pt states she started to get increasing pressure in her rectum that wouldn't go away. Pt states the ctx became worse today around 0100. Pt rates ctx 8/10. Pt denies LOF and VB. +FM   Onset of complaint: 1500 08/09/2022 Pain score: 8/10 Vitals:   08/10/22 0529  BP: (!) 121/54  Pulse: 77  Resp: 16  Temp: 97.7 F (36.5 C)  SpO2: 100%     FHT:145 Lab orders placed from triage:  UA

## 2022-08-11 LAB — CULTURE, OB URINE: Special Requests: NORMAL

## 2022-08-16 ENCOUNTER — Ambulatory Visit (INDEPENDENT_AMBULATORY_CARE_PROVIDER_SITE_OTHER): Payer: Medicaid Other | Admitting: Obstetrics & Gynecology

## 2022-08-16 ENCOUNTER — Encounter: Payer: Self-pay | Admitting: Family Medicine

## 2022-08-16 VITALS — BP 110/70 | HR 89 | Wt 177.0 lb

## 2022-08-16 DIAGNOSIS — Z148 Genetic carrier of other disease: Secondary | ICD-10-CM

## 2022-08-16 DIAGNOSIS — D573 Sickle-cell trait: Secondary | ICD-10-CM

## 2022-08-16 DIAGNOSIS — Z3492 Encounter for supervision of normal pregnancy, unspecified, second trimester: Secondary | ICD-10-CM

## 2022-08-16 DIAGNOSIS — Z3A22 22 weeks gestation of pregnancy: Secondary | ICD-10-CM

## 2022-08-16 DIAGNOSIS — Z8759 Personal history of other complications of pregnancy, childbirth and the puerperium: Secondary | ICD-10-CM

## 2022-08-16 NOTE — Progress Notes (Signed)
   PRENATAL VISIT NOTE  Subjective:  Heidi Martin is a 22 y.o. G4P1021 at 110w0d being seen today for ongoing prenatal care.  She is currently monitored for the following issues for this low-risk pregnancy and has Sickle cell trait ([place]); Nausea/vomiting in pregnancy; Supervision of low-risk pregnancy; Alpha thalassemia silent carrier; Carrier of spinal muscular atrophy; Bipolar disorder & ADHD; Migraine headache; History of FGR; and LSIL pap on their problem list.  Patient reports no complaints.  Contractions: Irritability. Vag. Bleeding: None.  Movement: Present. Denies leaking of fluid.   The following portions of the patient's history were reviewed and updated as appropriate: allergies, current medications, past family history, past medical history, past social history, past surgical history and problem list.   Objective:   Vitals:   08/16/22 1508  BP: 110/70  Pulse: 89  Weight: 80.3 kg    Fetal Status: Fetal Heart Rate (bpm): 150   Movement: Present     General:  Alert, oriented and cooperative. Patient is in no acute distress.  Skin: Skin is warm and dry. No rash noted.   Cardiovascular: Normal heart rate noted  Respiratory: Normal respiratory effort, no problems with respiration noted  Abdomen: Soft, gravid, appropriate for gestational age.  Pain/Pressure: Present     Pelvic: Cervical exam deferred        Extremities: Normal range of motion.  Edema: None  Mental Status: Normal mood and affect. Normal behavior. Normal judgment and thought content.   Assessment and Plan:  Pregnancy: G4P1021 at [redacted]w[redacted]d 1. Encounter for supervision of low-risk pregnancy in second trimester F/u US in 2 weeks to complete anatomy  2. Carrier of spinal muscular atrophy   3. History of FGR F/u growth  4. Sickle cell trait ([place])   Preterm labor symptoms and general obstetric precautions including but not limited to vaginal bleeding, contractions, leaking of fluid and fetal movement were  reviewed in detail with the patient. Please refer to After Visit Summary for other counseling recommendations.   Return in about 4 weeks (around 09/13/2022).  Future Appointments  Date Time Provider [place]  08/30/2022 10:30 AM [facility] NURSE [facility]  08/30/2022 10:45 AM [facility] US7 [facility]  09/06/2022  9:00 AM CENTERING PROVIDER [facility]  10/04/2022  9:00 AM CENTERING PROVIDER [facility]  10/18/2022  9:00 AM CENTERING PROVIDER [facility]  11/01/2022  9:00 AM CENTERING PROVIDER [facility]  11/15/2022  9:00 AM CENTERING PROVIDER [facility]  11/29/2022  9:00 AM CENTERING PROVIDER [facility]  12/13/2022  9:00 AM CENTERING PROVIDER [facility]  12/27/2022  9:00 AM CENTERING PROVIDER [facility]    Emeterio Reeve, MD

## 2022-08-17 ENCOUNTER — Telehealth: Payer: Self-pay

## 2022-08-17 DIAGNOSIS — Z148 Genetic carrier of other disease: Secondary | ICD-10-CM

## 2022-08-17 DIAGNOSIS — D573 Sickle-cell trait: Secondary | ICD-10-CM

## 2022-08-17 DIAGNOSIS — D563 Thalassemia minor: Secondary | ICD-10-CM

## 2022-08-22 NOTE — Telephone Encounter (Signed)
Patient contacted [employer] representative requesting results from [place] partner screening. Called pt. Spoke with patient and partner via phone and reviewed results. Both patient and partner tested positive as silent carriers for alpha-thalassemia. Scheduled for genetic counseling with MFM to discuss further.

## 2022-08-30 ENCOUNTER — Ambulatory Visit (HOSPITAL_BASED_OUTPATIENT_CLINIC_OR_DEPARTMENT_OTHER): Payer: Medicaid Other

## 2022-08-30 ENCOUNTER — Ambulatory Visit: Payer: Medicaid Other | Admitting: *Deleted

## 2022-08-30 ENCOUNTER — Ambulatory Visit: Payer: Medicaid Other | Attending: Obstetrics

## 2022-08-30 VITALS — BP 119/63 | HR 74

## 2022-08-30 DIAGNOSIS — D563 Thalassemia minor: Secondary | ICD-10-CM

## 2022-08-30 DIAGNOSIS — D573 Sickle-cell trait: Secondary | ICD-10-CM | POA: Insufficient documentation

## 2022-08-30 DIAGNOSIS — Z3A24 24 weeks gestation of pregnancy: Secondary | ICD-10-CM

## 2022-08-30 DIAGNOSIS — Z8759 Personal history of other complications of pregnancy, childbirth and the puerperium: Secondary | ICD-10-CM | POA: Diagnosis present

## 2022-08-30 DIAGNOSIS — O09299 Supervision of pregnancy with other poor reproductive or obstetric history, unspecified trimester: Secondary | ICD-10-CM

## 2022-08-30 DIAGNOSIS — Z148 Genetic carrier of other disease: Secondary | ICD-10-CM | POA: Diagnosis present

## 2022-08-30 DIAGNOSIS — O09292 Supervision of pregnancy with other poor reproductive or obstetric history, second trimester: Secondary | ICD-10-CM | POA: Diagnosis not present

## 2022-08-30 DIAGNOSIS — O99019 Anemia complicating pregnancy, unspecified trimester: Secondary | ICD-10-CM | POA: Diagnosis present

## 2022-08-30 DIAGNOSIS — O285 Abnormal chromosomal and genetic finding on antenatal screening of mother: Secondary | ICD-10-CM

## 2022-08-30 NOTE — Progress Notes (Signed)
[facility] at [facility] [address] Phone:  [phone]   Fax:  [phone]    Name: Heidi Martin Indication: Maternal carrier for Sickle Cell Trait, Spinal Muscular Atrophy, and Alpha Thalassemia in the Silent Configuration  DOB: 12/09/2000 Age: 22 y.o.   EDD: 12/20/2022 LMP: 03/24/2022 Referring Provider:  Woodroe Mode, MD  EGA: [id number] Counselor: AStaci Righter MS, CHenryetta OB Hx:[id number] of Appointment: 08/30/2022  Accompanied by: UKristine LineaFace to Face Time: 40 Minutes   Previous Testing Completed: JBetzabepreviously completed cell-free DNA screening (cfDNA) in this pregnancy. The result is low risk. This screening significantly reduces the risk that the current pregnancy has Down syndrome, Trisomy 111 Trisomy 161 and common sex chromosome conditions, however, the risk is not zero given the limitations of cfDNA. Additionally, there are many genetic conditions that cannot be detected by cfDNA.  JMahripreviously completed carrier screening. She screened to be a carrier for Sickle Cell Trait, Spinal Muscular Atrophy, and Alpha Thalassemia in the Silent Configuration. She screened to not be a carrier for Cystic Fibrosis (CF). A negative result on carrier screening reduces the likelihood of being a carrier, however, does not entirely rule out the possibility.   Medical History:  This is Heidi Martin's 4th pregnancy. She has a living, healthy daughter. She has had 2 early losses. Reports she takes prenatal vitamins. Personal history of bacterial vaginosis and a urinary tract infection in pregnancy. Denies personal history of diabetes, high blood pressure, thyroid conditions, and seizures. Denies bleeding and fevers in this pregnancy. Denies using tobacco, alcohol, or street drugs in this pregnancy.   Family History: A pedigree was created and scanned into Epic under the Media tab. JAldoniareports her father is affected  with Sickle Cell Disease (Hb S/S). JJrureports her Martin had breast cancer and cervical cancer under the age of 526 Maternal ethnicity reported as BResearch scientist (physical sciences)and paternal ethnicity reported as BResearch scientist (physical sciences) Denies Ashkenazi Jewish ancestry. Family history not remarkable for consanguinity, individuals with birth defects, intellectual disability, autism spectrum disorder, multiple spontaneous abortions, still births, or unexplained neonatal death.     Genetic Counseling:   Maternal Silent Carrier for Alpha Thalassemia. Alpha Thalassemia refers to a group of autosomal recessive blood disorders that reduce the amount of hemoglobin, the protein in red blood cells that carries oxygen to tissues throughout the body. Hemoglobin is made up of both alpha globin and beta globin proteins. Alpha Thalassemia is different in its inheritance compared to other hemoglobinopathies as there are two alpha-globin genes on each chromosome 16 (??/??). Alpha Thalassemia occurs when three or more of the four alpha-globin genes are deleted. JLakanis a silent carrier for alpha thalassemia (??/-?) caused by the pathogenic alpha 3.7 deletion of the HBA2 gene. With this result, we know that JAlliehas three functional copies of the alpha-globin genes while her 4th alpha-globin gene is deleted. Each of Heidi Martin's children will either inherit two functional genes (??) or one functional gene and one deletion (-?) from her. Heidi Martin is also a silent carrier for alpha thalassemia (??/-?) caused by the pathogenic alpha 3.7 deletion of the HBA2 gene. With this result, we know that Heidi Martin has three functional copies of the alpha-globin genes while his 4th alpha globin gene is deleted. Each of Heidi Martin's children will either inherit two functional genes (??) or one functional gene and one deletion (-?) from him. Given that both JKazakhstanand KVirgel Martin silent  carriers for Alpha Thalassemia we discussed the following  chances: 25% chance their children together will be non-carriers (??/??) 50% chance their children together will be silent carriers (??/-?) 25% chance their children together will be trans carriers (-?/-?) We discussed that it is highly unlikely for the couple to have a child together affected with Hemoglobin H Disease (--/-?) or Hemoglobin Bart's Disease (--/--) given these results.   Carrier of Spinal Muscular Atrophy (SMA). SMA is an autosomal recessive disease caused by loss of function mutations in the SMN1 gene. Typically, the SMN1 gene provides instructions for the Survival Motor Neuron (SMN) protein. This protein has important functions in the maintenance of motor neurons in the skeletal muscles that allow the body to move. Affected individuals with loss of function mutations in the SMN1 gene have a shortage of SMN protein which causes progressive motor neuron death, leading to the symptoms of SMA. A similar gene, SMN2, provides instructions to create a full length SMN protein approximately 10% of the time. Thus, the number of copies of the SMN2 gene an affected individual has modifies the severity of symptoms they experience and classification between different types of the condition. The five types of SMA listed below are historically classified based on clinical presentation, onset of symptoms, and the maximum skill level of motor milestones achieved. There is a lot of overlap between types. Type Zero is the most severe and is characterized by symptom onset in utero and death soon after birth. Type I is the most common with symptom onset before six months of age a two-year life expectancy. Infants with SMA type I typically have severe hypotonia, difficulty breathing, poor suck, and are unable to sit independently.  Type II has onset of symptoms between six to eighteen months of age. Individuals with SMA type II are able to sit without support, although they may not retain this skill, and they are  not able to walk, have generalized muscle weakness, and may develop kyphoscoliosis.  Type III has onset of symptoms after eighteen months of age. Individuals with SMA type III may be able to sit and walk independently, although they may not retain these skills, and may develop scoliosis. Type IV has onset of symptoms in adulthood. Individuals with SMA type IV typically have mild motor impairment and respiratory problems. Several treatments are available for individuals affected with SMA and studies show that treatment is most effective when it is started in the first few months of life.  Brandelyn is a carrier for SMA, meaning she has one functional copy of SMN1 and her other copy of SMN1 has lost its function either due to a mutation in the gene or a deletion of the gene. Heidi Martin screened to have 3 or more copies of the SMN1 gene. Given Heidi Martin's negative carrier screening result we discussed that there is a 50% chance their children together will be carriers for SMA (like Jolina). We discussed that it is highly unlikely for the couple to have a child together affected with SMA given that [place] screened negative.   Maternal Sickle Cell Trait. Sickle Cell Disease is an inherited blood disorder that affects hemoglobin, the part of the red blood cells that carries oxygen to tissues throughout the body. Red blood cells with normal hemoglobin (Hemoglobin A) are smooth and disk-shaped and can move through blood vessels easily. Cells with Sickle Hemoglobin (Hemoglobin S) are stiff and sticky. When they lose their oxygen, they form into the shape of a sickle or crescent and can't  easily move through blood vessels. Pain crises occur when the flow of blood is blocked to an area because sickled cells have become stuck in the blood vessel. This pain can occur anywhere, but most often occurs in the chest, arms, and legs. Infants and young children may also have painful swelling of the fingers and toes. Acute chest  syndrome happens when sickling occurs in the chest. This can be life-threatening as sickled cells block the flow of oxygen in the vessels of the lungs. Splenic sequestration occurs when sickled cells pool in the spleen causing a sudden drop in hemoglobin. Most children, by age 27, do not have a working spleen either from surgical removal, or from repeated episodes of splenic sequestration. Therefore, the risk of infection is a major concern for children without a working spleen. Childhood stroke is also a severe complication people with Sickle Cell Disease can experience as the sickled cells can block the major blood vessels that supply the brain with oxygen. Jaundice of the skin, eyes, and mouth and priapism are also symptoms individuals affected with Sickle Cell Disease commonly experience. Anemia is another common symptom because sickled cells are short-lived (10 to 20 days compared to normal red blood cells that can live up to 120 days). Most individuals with Sickle Cell Disease are healthy at birth and become symptomatic later in infancy or childhood after fetal hemoglobin levels decrease. Of importance to note is that medical treatment is available to lessen the symptoms of the condition.   Genetic counseling reviewed with Emelynn that Sickle Cell Disease is caused by autosomal recessive mutations (gene changes) in the HBB genes. Normal was identified to be a carrier for Sickle Cell Trait by carrier screening through the laboratory [facility] (positive for the pathogenic variant c.20A>T, p.E7V). This means she has both the usual type of hemoglobin, Hemoglobin A, and the altered type, Hemoglobin S (Hb A/S). People who have Hb A/S typically do not have any clinical features, however, may experience sickling under extreme conditions. Heidi Martin screened negative for Sickle Cell Trait and other Beta-Hemoglobinopathies. Given Heidi Martin's negative carrier screening result we discussed that there is a 50% chance their  children together will be carriers of Sickle Cell Trait (Hb A/S; like [place]ar[place]). We discussed that it is highly unlikely for the couple to have a child together affected with Sickle Cell Disease or other Beta-Hemoglobinopathies given that Heidi Martin screened negative.  Family History of Cancer. Given Sharis's family history of cancer, we discussed the benefits of seeing a cancer genetic counselor to assess the risk of an inherited cancer syndrome. It is also important for Alysen to further discuss this family history with their physician in order to begin proper cancer screening and detection.    Thank you for sharing in the care of Breana with [place].  Please do not hesitate to contact us if you have any questions.  Staci Righter, MS, [place] Certified Genetic Counselor  Genetic counseling student involved in appointment: Yes (S.B.)

## 2022-09-05 ENCOUNTER — Encounter (HOSPITAL_COMMUNITY): Payer: Self-pay | Admitting: Obstetrics & Gynecology

## 2022-09-05 ENCOUNTER — Inpatient Hospital Stay (HOSPITAL_COMMUNITY)
Admission: AD | Admit: 2022-09-05 | Discharge: 2022-09-05 | Disposition: A | Discharge status: Home / Self Care | Payer: Medicaid Other | Attending: Obstetrics & Gynecology | Admitting: Obstetrics & Gynecology

## 2022-09-05 DIAGNOSIS — O26892 Other specified pregnancy related conditions, second trimester: Secondary | ICD-10-CM | POA: Diagnosis present

## 2022-09-05 DIAGNOSIS — Z3A24 24 weeks gestation of pregnancy: Secondary | ICD-10-CM

## 2022-09-05 DIAGNOSIS — O23592 Infection of other part of genital tract in pregnancy, second trimester: Secondary | ICD-10-CM | POA: Diagnosis not present

## 2022-09-05 DIAGNOSIS — Z8616 Personal history of COVID-19: Secondary | ICD-10-CM | POA: Insufficient documentation

## 2022-09-05 DIAGNOSIS — B9689 Other specified bacterial agents as the cause of diseases classified elsewhere: Secondary | ICD-10-CM | POA: Diagnosis not present

## 2022-09-05 DIAGNOSIS — R109 Unspecified abdominal pain: Secondary | ICD-10-CM | POA: Insufficient documentation

## 2022-09-05 DIAGNOSIS — Z3492 Encounter for supervision of normal pregnancy, unspecified, second trimester: Secondary | ICD-10-CM

## 2022-09-05 DIAGNOSIS — O26899 Other specified pregnancy related conditions, unspecified trimester: Secondary | ICD-10-CM

## 2022-09-05 LAB — URINALYSIS, ROUTINE W REFLEX MICROSCOPIC
Glucose, UA: NEGATIVE mg/dL
Hgb urine dipstick: NEGATIVE
Ketones, ur: NEGATIVE mg/dL
Leukocytes,Ua: NEGATIVE
Nitrite: NEGATIVE
Protein, ur: NEGATIVE mg/dL
Specific Gravity, Urine: 1.024 (ref 1.005–1.030)
pH: 6 (ref 5.0–8.0)

## 2022-09-05 LAB — WET PREP, GENITAL
Sperm: NONE SEEN
Trich, Wet Prep: NONE SEEN
WBC, Wet Prep HPF POC: >=10 — AB (ref <10)
Yeast Wet Prep HPF POC: NONE SEEN

## 2022-09-05 LAB — GC/CHLAMYDIA PROBE AMP (~~LOC~~) NOT AT ARMC
Chlamydia: NEGATIVE
Comment: NEGATIVE
Comment: NORMAL
Neisseria Gonorrhea: NEGATIVE

## 2022-09-05 LAB — POCT FERN TEST: POCT Fern Test: NEGATIVE

## 2022-09-05 MED ORDER — CYCLOBENZAPRINE HCL 5 MG PO TABS
10.0000 mg | ORAL_TABLET | Freq: Once | ORAL | Status: AC
  Administered 2022-09-05: 10 mg via ORAL
  Filled 2022-09-05: qty 2

## 2022-09-05 MED ORDER — METRONIDAZOLE 0.75 % VA GEL: 1.0000 | Freq: Every day | VAGINAL | 1 refills | Status: DC

## 2022-09-05 MED ORDER — ACETAMINOPHEN 500 MG PO TABS
1000.0000 mg | ORAL_TABLET | Freq: Once | ORAL | Status: AC
  Administered 2022-09-05: 1000 mg via ORAL
  Filled 2022-09-05: qty 2

## 2022-09-05 MED ORDER — CYCLOBENZAPRINE HCL 10 MG PO TABS: 10.0000 mg | ORAL_TABLET | Freq: Two times a day (BID) | ORAL | 0 refills | Status: DC | PRN

## 2022-09-05 NOTE — MAU Provider Note (Signed)
History     CSN: [id number]  Arrival date and time: 09/05/22 0002   None     Chief Complaint  Patient presents with   Abdominal Pain   HPI Heidi Martin is a 22 y.o. [id number] at 60w6dwho presents to MAU for contractions. She reports contractions started around 0410. She was able to go to sleep. When she went to work around 1500, contractions started again. She reports contractions every 30 minutes apart. She did take some Tylenol at 2000. She is also endorsing a lot of pelvic pressure particularly with movement.   Towards the end of last week she reports she had a gush of watery mucous that occurred while she was using the bathroom and ever since she feels like the cramping and contractions have worsened. She is not having to wear a pad. She denies vaginal bleeding, itching, odor, or urinary s/s. Endorses active fetal movement.  Pregnancy course: uncomplicated pregnancy. [facility], next appointment is on 3/6.  OB History     Gravida  4   Para  1   Term  1   Preterm      AB  2   Living  1      SAB  1   IAB  1   Ectopic      Multiple  0   Live Births  1           Past Medical History:  Diagnosis Date   Alpha thalassemia silent carrier    COVID-19    Irritable bowel syndrome (IBS)    [place]     Past Surgical History:  Procedure Laterality Date   DILATION AND EVACUATION N/A 10/27/2021   Procedure: DILATATION AND EVACUATION;  Surgeon: PAletha Halim MD;  [facility]  Service: Gynecology;  Laterality: N/A;    Family History  Problem Relation Age of Onset   Cancer Mother    Depression Mother    Miscarriages / Stillbirths Mother    Hypertension Mother    Hypertension Father    Cancer Maternal Grandmother    Diabetes Maternal Grandmother    Miscarriages / Stillbirths Maternal Grandmother     Social History   Tobacco Use   Smoking status: Never   Smokeless tobacco: Never   Vaping Use   Vaping Use: Never used  Substance Use Topics   Alcohol use: No   Drug use: No    Allergies:  Allergies  Allergen Reactions   Milk [Milk ([place]ow)] Nausea And Vomiting    Stomach cramping    No medications prior to admission.    Review of Systems  Gastrointestinal:  Positive for abdominal pain.  Genitourinary:  Positive for pelvic pain and vaginal discharge.  All other systems reviewed and are negative.  Physical Exam  Patient Vitals for the past 24 hrs:  BP Temp Pulse Resp Height Weight  09/05/22 0216 103/60 -- 78 -- -- --  09/05/22 0026 109/61 98.1 F (36.7 C) 86 16 '5\' 7"'$  (1.702 m) 85.5 kg   Physical Exam Vitals and nursing note reviewed. Exam conducted with a chaperone present.  Constitutional:      General: She is not in acute distress. Cardiovascular:     Rate and Rhythm: Normal rate.  Pulmonary:     Effort: Pulmonary effort is normal. No respiratory distress.  Abdominal:     Palpations: Abdomen is soft.     Tenderness: There is no abdominal tenderness.  Comments: Gravid   Genitourinary:    Comments: Normal external female genitalia, vaginal walls pink and well-rugated, small amount of thin white discharge, no pooling of amniotic fluid, no bleeding, cervix visually closed without lesions/masses Skin:    General: Skin is warm and dry.  Neurological:     General: No focal deficit present.     Mental Status: She is alert and oriented to person, place, and time.  Psychiatric:        Mood and Affect: Mood normal.        Behavior: Behavior normal.   Dilation: Closed Exam by:: Maryagnes Amos, CNM  NST FHR: 145 bpm, moderate variability, +15x15 accels, 2 variable decels (appropriate for gestational age) Toco: 32 ui  Results for orders placed or performed during the hospital encounter of 09/05/22 (from the past 24 hour(s))  Urinalysis, Routine w reflex microscopic -Urine, Clean Catch     Status: Abnormal   Collection Time: 09/05/22 12:17 AM   Result Value Ref Range   Color, Urine YELLOW YELLOW   APPearance HAZY (A) CLEAR   Specific Gravity, Urine 1.024 1.005 - 1.030   pH 6.0 5.0 - 8.0   Glucose, UA NEGATIVE NEGATIVE mg/dL   Hgb urine dipstick NEGATIVE NEGATIVE   Bilirubin Urine SMALL (A) NEGATIVE   Ketones, ur NEGATIVE NEGATIVE mg/dL   Protein, ur NEGATIVE NEGATIVE mg/dL   Nitrite NEGATIVE NEGATIVE   Leukocytes,Ua NEGATIVE NEGATIVE  Wet prep, genital     Status: Abnormal   Collection Time: 09/05/22 12:55 AM   Specimen: Vaginal  Result Value Ref Range   Yeast Wet Prep HPF POC NONE SEEN NONE SEEN   Trich, Wet Prep NONE SEEN NONE SEEN   Clue Cells Wet Prep HPF POC PRESENT (A) NONE SEEN   WBC, Wet Prep HPF POC >=10 (A) <10   Sperm NONE SEEN   Fern Test     Status: None   Collection Time: 09/05/22 12:56 AM  Result Value Ref Range   POCT Fern Test Negative = intact amniotic membranes    MAU Course  Procedures  MDM UA Wet prep, GC/CT NST Tylenol/Flexeril  UA and fern negative. Wet prep positive for clue cells and WBCs- will treat for BV. Cervix closed/thick - Tylenol and Flexeril given with complete resolution of pain. Cervix remains unchanged after 1 hour. Low suspicion for PTL. Suspect cramping due to BV.    Assessment and Plan   1. Encounter for supervision of low-risk pregnancy in second trimester   2. [redacted] weeks gestation of pregnancy   3. Abdominal pain affecting pregnancy   4. Bacterial vaginosis    - Discharge home in stable condition - Rx for Metrogel and Flexeril - Return precautions given. Return to MAU for new/worsening symptoms - Keep OB appointment as scheduled on 3/6  Renee Harder, CNM 09/05/2022, 2:42 AM

## 2022-09-05 NOTE — MAU Note (Signed)
Pt says UC's started at 0500. Last sex- 6 mths ago

## 2022-09-06 ENCOUNTER — Ambulatory Visit: Payer: Self-pay | Admitting: Certified Nurse Midwife

## 2022-09-06 ENCOUNTER — Other Ambulatory Visit: Payer: Self-pay

## 2022-09-06 DIAGNOSIS — Z3492 Encounter for supervision of normal pregnancy, unspecified, second trimester: Secondary | ICD-10-CM

## 2022-09-06 DIAGNOSIS — Z3A25 25 weeks gestation of pregnancy: Secondary | ICD-10-CM

## 2022-09-08 NOTE — Progress Notes (Signed)
Did not come to [facility], but was in MAU overnight.

## 2022-09-12 ENCOUNTER — Emergency Department (HOSPITAL_COMMUNITY)
Admission: EM | Admit: 2022-09-12 | Discharge: 2022-09-13 | Discharge status: Against Medical Advice | Payer: Medicaid Other | Attending: Emergency Medicine | Admitting: Emergency Medicine

## 2022-09-12 ENCOUNTER — Other Ambulatory Visit: Payer: Self-pay

## 2022-09-12 ENCOUNTER — Emergency Department (HOSPITAL_COMMUNITY): Payer: Medicaid Other

## 2022-09-12 DIAGNOSIS — M79641 Pain in right hand: Secondary | ICD-10-CM | POA: Diagnosis present

## 2022-09-12 DIAGNOSIS — Z5321 Procedure and treatment not carried out due to patient leaving prior to being seen by health care provider: Secondary | ICD-10-CM | POA: Diagnosis not present
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
## 2022-09-13 ENCOUNTER — Other Ambulatory Visit: Payer: Medicaid Other

## 2022-09-13 DIAGNOSIS — Z3492 Encounter for supervision of normal pregnancy, unspecified, second trimester: Secondary | ICD-10-CM

## 2022-09-13 NOTE — ED Notes (Signed)
Pt called for room x3, no response.

## 2022-09-14 LAB — CBC
Hematocrit: 31.7 % — ABNORMAL LOW (ref 34.0–46.6)
Hemoglobin: 10 g/dL — ABNORMAL LOW (ref 11.1–15.9)
MCH: 25.3 pg — ABNORMAL LOW (ref 26.6–33.0)
MCHC: 31.5 g/dL (ref 31.5–35.7)
MCV: 80 fL (ref 79–97)
Platelets: 266 10*3/uL (ref 150–450)
RBC: 3.95 x10E6/uL (ref 3.77–5.28)
RDW: 15.1 % (ref 11.7–15.4)
WBC: 11.3 10*3/uL — ABNORMAL HIGH (ref 3.4–10.8)

## 2022-09-14 LAB — GLUCOSE TOLERANCE, 2 HOURS W/ 1HR
Glucose, 1 hour: 46 mg/dL — ABNORMAL LOW (ref 70–179)
Glucose, 2 hour: 76 mg/dL (ref 70–152)
Glucose, Fasting: 79 mg/dL (ref 70–91)

## 2022-09-14 LAB — HIV ANTIBODY (ROUTINE TESTING W REFLEX): HIV Screen 4th Generation wRfx: NONREACTIVE

## 2022-09-14 LAB — RPR: RPR Ser Ql: NONREACTIVE

## 2022-09-22 ENCOUNTER — Telehealth: Payer: Medicaid Other | Admitting: Family Medicine

## 2022-09-22 DIAGNOSIS — K0889 Other specified disorders of teeth and supporting structures: Secondary | ICD-10-CM

## 2022-09-22 DIAGNOSIS — Z3490 Encounter for supervision of normal pregnancy, unspecified, unspecified trimester: Secondary | ICD-10-CM

## 2022-09-22 NOTE — Progress Notes (Signed)
Virtual Visit Consent   Heidi Martin, you are scheduled for a virtual visit with a [facility] provider today. Just as with appointments in the office, your consent must be obtained to participate. Your consent will be active for this visit and any virtual visit you may have with one of our providers in the next 365 days. If you have a MyChart account, a copy of this consent can be sent to you electronically.  As this is a virtual visit, video technology does not allow for your provider to perform a traditional examination. This may limit your provider's ability to fully assess your condition. If your provider identifies any concerns that need to be evaluated in person or the need to arrange testing (such as labs, EKG, etc.), we will make arrangements to do so. Although advances in technology are sophisticated, we cannot ensure that it will always work on either your end or our end. If the connection with a video visit is poor, the visit may have to be switched to a telephone visit. With either a video or telephone visit, we are not always able to ensure that we have a secure connection.  By engaging in this virtual visit, you consent to the provision of healthcare and authorize for your insurance to be billed (if applicable) for the services provided during this visit. Depending on your insurance coverage, you may receive a charge related to this service.  I need to obtain your verbal consent now. Are you willing to proceed with your visit today? Heidi Martin has provided verbal consent on 09/22/2022 for a virtual visit (video or telephone). Heidi Nims, FNP  Date: 09/22/2022 4:05 PM  Virtual Visit via Video Note   I, Heidi Martin, connected with  Heidi Martin  ([id number], April 06, 2001) on 09/22/22 at  4:00 PM EDT by a video-enabled telemedicine application and verified that I am speaking with the correct person using two identifiers.  Location: Patient: Virtual Visit Location Patient:  Home Provider: Virtual Visit Location Provider: Home Office   I discussed the limitations of evaluation and management by telemedicine and the availability of in person appointments. The patient expressed understanding and agreed to proceed.    History of Present Illness: Heidi Martin is a 22 y.o. who identifies as a female who was assigned female at birth, and is being seen today for a toothache. Rt upper and lower teeth. Had a tooth extracted on left Tuesday. Given antibiotics and told to take tylenol. She is pregnant. Marland Kitchen  HPI: HPI  Problems:  Patient Active Problem List   Diagnosis Date Noted   LSIL pap 06/16/2022   Alpha thalassemia silent carrier 06/12/2022   Carrier of spinal muscular atrophy 06/12/2022   Bipolar disorder & ADHD 06/12/2022   Migraine headache 06/12/2022   History of FGR 06/12/2022   Supervision of low-risk pregnancy 06/01/2022   Nausea/vomiting in pregnancy 05/08/2022   Sickle cell trait ([place]) 05/31/2015    Allergies:  Allergies  Allergen Reactions   Milk [Milk (Cow)] Nausea And Vomiting    Stomach cramping   Medications:  Current Outpatient Medications:    acetaminophen (TYLENOL) 500 MG tablet, Take 500 mg by mouth every 6 (six) hours as needed., Disp: , Rfl:    Blood Pressure Monitoring (BLOOD PRESSURE KIT) DEVI, 1 each by Does not apply route 3 times/day as needed-between meals & bedtime., Disp: 1 each, Rfl: 0   cyclobenzaprine (FLEXERIL) 10 MG tablet, Take 1 tablet (10 mg total) by mouth 2 (  two) times daily as needed for muscle spasms., Disp: 20 tablet, Rfl: 0   doxylamine, Sleep, (UNISOM) 25 MG tablet, Take 1 tablet (25 mg total) by mouth at bedtime. (Patient not taking: Reported on 08/30/2022), Disp: 30 tablet, Rfl: 2   magnesium oxide (MAG-OX) 400 MG tablet, Take 1 tablet (400 mg total) by mouth daily. (Patient not taking: Reported on 07/12/2022), Disp: 30 tablet, Rfl: 1   metoCLOPramide (REGLAN) 10 MG tablet, Take 1 tablet (10 mg total) by mouth 4  (four) times daily as needed for nausea or vomiting. (Patient not taking: Reported on 07/12/2022), Disp: 30 tablet, Rfl: 2   metroNIDAZOLE (METROGEL) 0.75 % vaginal gel, Place 1 Applicatorful vaginally at bedtime. Apply one applicatorful to vagina at bedtime for 5 days, Disp: 70 g, Rfl: 1   Misc. Devices ([place]) MISC, 1 each by Does not apply route as needed., Disp: 1 each, Rfl: 0   Prenat-FeFum-FePo-FA-Omega 3 (CONCEPT DHA) 53.5-38-1 MG CAPS, Take 1 tablet by mouth daily., Disp: 30 capsule, Rfl: 12   promethazine (PHENERGAN) 25 MG tablet, Take 1 tablet (25 mg total) by mouth every 6 (six) hours as needed for nausea or vomiting., Disp: 30 tablet, Rfl: 3   pyridOXINE (VITAMIN B6) 25 MG tablet, 1 tablet po tid, Disp: 30 tablet, Rfl: 3  Observations/Objective: Patient is well-developed, well-nourished in no acute distress.  Resting comfortably  at home.  Head is normocephalic, atraumatic.  No labored breathing.  Speech is clear and coherent with logical content.  Patient is alert and oriented at baseline.    Assessment and Plan: 1. Pregnancy, unspecified gestational age  48. Toothache  Instructed to call her dentist and or her OB as she is pregnant.   Follow Up Instructions: I discussed the assessment and treatment plan with the patient. The patient was provided an opportunity to ask questions and all were answered. The patient agreed with the plan and demonstrated an understanding of the instructions.  A copy of instructions were sent to the patient via MyChart unless otherwise noted below.     The patient was advised to call back or seek an in-person evaluation if the symptoms worsen or if the condition fails to improve as anticipated.  Time:  I spent 8 minutes with the patient via telehealth technology discussing the above problems/concerns.    Heidi Nims, FNP

## 2022-09-23 ENCOUNTER — Inpatient Hospital Stay (HOSPITAL_COMMUNITY): Payer: Medicaid Other

## 2022-09-23 ENCOUNTER — Encounter (HOSPITAL_COMMUNITY): Payer: Self-pay | Admitting: Obstetrics and Gynecology

## 2022-09-23 ENCOUNTER — Inpatient Hospital Stay (HOSPITAL_COMMUNITY)
Admission: AD | Admit: 2022-09-23 | Discharge: 2022-09-23 | Disposition: A | Discharge status: Home / Self Care | Payer: Medicaid Other | Attending: Obstetrics and Gynecology | Admitting: Obstetrics and Gynecology

## 2022-09-23 DIAGNOSIS — M79672 Pain in left foot: Secondary | ICD-10-CM | POA: Diagnosis not present

## 2022-09-23 DIAGNOSIS — M7989 Other specified soft tissue disorders: Secondary | ICD-10-CM | POA: Diagnosis not present

## 2022-09-23 DIAGNOSIS — Z8616 Personal history of COVID-19: Secondary | ICD-10-CM | POA: Insufficient documentation

## 2022-09-23 DIAGNOSIS — Z3689 Encounter for other specified antenatal screening: Secondary | ICD-10-CM | POA: Diagnosis not present

## 2022-09-23 DIAGNOSIS — Z3A27 27 weeks gestation of pregnancy: Secondary | ICD-10-CM | POA: Insufficient documentation

## 2022-09-23 DIAGNOSIS — O26892 Other specified pregnancy related conditions, second trimester: Secondary | ICD-10-CM | POA: Insufficient documentation

## 2022-09-23 DIAGNOSIS — O1202 Gestational edema, second trimester: Secondary | ICD-10-CM | POA: Insufficient documentation

## 2022-09-23 LAB — URINALYSIS, ROUTINE W REFLEX MICROSCOPIC
Bilirubin Urine: NEGATIVE
Glucose, UA: NEGATIVE mg/dL
Hgb urine dipstick: NEGATIVE
Ketones, ur: NEGATIVE mg/dL
Nitrite: NEGATIVE
Protein, ur: NEGATIVE mg/dL
Specific Gravity, Urine: 1.014 (ref 1.005–1.030)
pH: 6 (ref 5.0–8.0)

## 2022-09-23 MED ORDER — CALCIUM CARBONATE ANTACID 500 MG PO CHEW
400.0000 mg | CHEWABLE_TABLET | Freq: Once | ORAL | Status: AC
  Administered 2022-09-23: 400 mg via ORAL
  Filled 2022-09-23: qty 2

## 2022-09-23 MED ORDER — TRAMADOL-ACETAMINOPHEN 37.5-325 MG PO TABS: 2.0000 | ORAL_TABLET | Freq: Once | ORAL | Status: DC

## 2022-09-23 MED ORDER — TRAMADOL HCL 50 MG PO TABS
50.0000 mg | ORAL_TABLET | Freq: Once | ORAL | Status: AC
  Administered 2022-09-23: 50 mg via ORAL
  Filled 2022-09-23: qty 1

## 2022-09-23 MED ORDER — ACETAMINOPHEN 325 MG PO TABS
650.0000 mg | ORAL_TABLET | Freq: Once | ORAL | Status: AC
  Administered 2022-09-23: 650 mg via ORAL
  Filled 2022-09-23: qty 2

## 2022-09-23 NOTE — MAU Provider Note (Signed)
History     CSN: [id number]  Arrival date and time: 09/23/22 0054   Event Date/Time   First Provider Initiated Contact with Patient 09/23/22 0234      No chief complaint on file.  Heidi Martin is a 22 y.o. [phone] at [redacted]w[redacted]d who receives care at [facility].  She presents today for swelling and pain in her feet.  She reports her symptoms started Thursday morning.  She states she has elevated and soaked her feet in epsom salt. She states it is an "ongoing sharp shooting pain" that has no relieving factors, but finds that movement makes it worse.  Patient rates the pain a 7/10 and denies recent trauma.  She states she took tylenol, for the pain yesterday, but had reached maximum dosage with no relief.   Patient endorses fetal movement and denies vaginal concerns.  She does report some intermittent cramping, but no contractions. Patient reports she drinks ~ 2.5 bottles of water daily.  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   OB History     Gravida  4   Para  1   Term  1   Preterm      AB  2   Living  1      SAB  1   IAB  1   Ectopic      Multiple  0   Live Births  1           Past Medical History:  Diagnosis Date   Alpha thalassemia silent carrier    COVID-19    Irritable bowel syndrome (IBS)    Sickle cell trait ([place])     Past Surgical History:  Procedure Laterality Date   DILATION AND EVACUATION N/A 10/27/2021   Procedure: DILATATION AND EVACUATION;  Surgeon: Aletha Halim, MD;  Location: [facility];  Service: Gynecology;  Laterality: N/A;    Family History  Problem Relation Age of Onset   Cancer Mother    Depression Mother    Miscarriages / Stillbirths Mother    Hypertension Mother    Hypertension Father    Cancer Maternal Grandmother    Diabetes Maternal Grandmother    Miscarriages / Stillbirths Maternal Grandmother     Social History   Tobacco Use   Smoking status: Never   Smokeless tobacco: Never  Vaping Use   Vaping  Use: Never used  Substance Use Topics   Alcohol use: No   Drug use: No    Allergies:  Allergies  Allergen Reactions   Milk [Milk (Cow)] Nausea And Vomiting    Stomach cramping    Medications Prior to Admission  Medication Sig Dispense Refill Last Dose   acetaminophen (TYLENOL) 500 MG tablet Take 500 mg by mouth every 6 (six) hours as needed.   09/22/2022   Prenat-FeFum-FePo-FA-Omega 3 (CONCEPT DHA) 53.5-38-1 MG CAPS Take 1 tablet by mouth daily. 30 capsule 12 09/22/2022   promethazine (PHENERGAN) 25 MG tablet Take 1 tablet (25 mg total) by mouth every 6 (six) hours as needed for nausea or vomiting. 30 tablet 3 Past Week   pyridOXINE (VITAMIN B6) 25 MG tablet 1 tablet po tid 30 tablet 3 09/22/2022   Blood Pressure Monitoring (BLOOD PRESSURE KIT) DEVI 1 each by Does not apply route 3 times/day as needed-between meals & bedtime. 1 each 0    cyclobenzaprine (FLEXERIL) 10 MG tablet Take 1 tablet (10 mg total) by mouth 2 (two) times daily as needed for muscle spasms.  20 tablet 0 09/19/2022   doxylamine, Sleep, (UNISOM) 25 MG tablet Take 1 tablet (25 mg total) by mouth at bedtime. (Patient not taking: Reported on 08/30/2022) 30 tablet 2    magnesium oxide (MAG-OX) 400 MG tablet Take 1 tablet (400 mg total) by mouth daily. (Patient not taking: Reported on 07/12/2022) 30 tablet 1    metoCLOPramide (REGLAN) 10 MG tablet Take 1 tablet (10 mg total) by mouth 4 (four) times daily as needed for nausea or vomiting. (Patient not taking: Reported on 07/12/2022) 30 tablet 2    metroNIDAZOLE (METROGEL) 0.75 % vaginal gel Place 1 Applicatorful vaginally at bedtime. Apply one applicatorful to vagina at bedtime for 5 days 70 g 1    Misc. Devices ([place]) MISC 1 each by Does not apply route as needed. 1 each 0     Review of Systems  Gastrointestinal:  Negative for constipation, diarrhea, nausea and vomiting.  Genitourinary:  Negative for difficulty urinating, dysuria, vaginal bleeding and vaginal  discharge.   Physical Exam   Blood pressure 111/69, pulse 90, temperature 98.1 F (36.7 C), temperature source Oral, resp. rate 18, height 5\' 7"  (1.702 m), weight 88.2 kg, last menstrual period 03/24/2022, unknown if currently breastfeeding.  Physical Exam Vitals reviewed.  Constitutional:      Appearance: Normal appearance.  HENT:     Head: Normocephalic and atraumatic.  Eyes:     Conjunctiva/sclera: Conjunctivae normal.  Cardiovascular:     Rate and Rhythm: Normal rate.     Pulses:          Dorsalis pedis pulses are 1+ on the right side and 1+ on the left side.  Pulmonary:     Effort: Pulmonary effort is normal. No respiratory distress.  Musculoskeletal:        General: Normal range of motion.     Cervical back: Normal range of motion.     Right lower leg: No edema.     Left lower leg: No edema.     Right foot: Normal range of motion. No foot drop.     Left foot: Normal range of motion. No foot drop.  Feet:     Right foot:     Skin integrity: No erythema or warmth.     Left foot:     Skin integrity: No erythema or warmth.  Skin:    General: Skin is warm and dry.  Neurological:     Mental Status: She is alert and oriented to person, place, and time.  Psychiatric:        Mood and Affect: Mood normal.        Behavior: Behavior normal.     Fetal Assessment 135 bpm, Mod Var, -Decels, +15x15Accels Toco: No ctx graphed  MAU Course   Results for orders placed or performed during the hospital encounter of 09/23/22 (from the past 24 hour(s))  Urinalysis, Routine w reflex microscopic -Urine, Clean Catch     Status: Abnormal   Collection Time: 09/23/22  2:08 AM  Result Value Ref Range   Color, Urine YELLOW YELLOW   APPearance HAZY (A) CLEAR   Specific Gravity, Urine 1.014 1.005 - 1.030   pH 6.0 5.0 - 8.0   Glucose, UA NEGATIVE NEGATIVE mg/dL   Hgb urine dipstick NEGATIVE NEGATIVE   Bilirubin Urine NEGATIVE NEGATIVE   Ketones, ur NEGATIVE NEGATIVE mg/dL   Protein, ur  NEGATIVE NEGATIVE mg/dL   Nitrite NEGATIVE NEGATIVE   Leukocytes,Ua SMALL (A) NEGATIVE   RBC / HPF 0-5  0 - 5 RBC/hpf   WBC, UA 0-5 0 - 5 WBC/hpf   Bacteria, UA RARE (A) NONE SEEN   Squamous Epithelial / HPF 11-20 0 - 5 /HPF   Mucus PRESENT    DG Foot 2 Views Left  Result Date: 09/23/2022 CLINICAL DATA:  Left foot pain and swelling EXAM: LEFT FOOT - 2 VIEW COMPARISON:  None Available. FINDINGS: There is no evidence of fracture or dislocation. There is no evidence of arthropathy or other focal bone abnormality. Soft tissues are unremarkable. IMPRESSION: Negative. Electronically Signed   By: Fidela Salisbury M.D.   On: 09/23/2022 04:13    MDM PE Labs: None EFM Pain medication XRAY Assessment and Plan  22 year old G4P1021  SIUP at 27.3 weeks Cat I FT Pedal Edema and Pain   -Exam performed and findings discussed. -Patient offered and accepts pain medication. -Ultracet ordered. -NST reactive -Discussed obtaining Xray to r/o fracture or other abnormalities. -Reviewed risks of fetal exposure during Xray process.   -Patient verbalizes understanding and wishes to proceed with procedure.  -Will await results.   Maryann Conners MSN, CNM 09/23/2022, 2:34 AM   Reassessment (4:56 AM) -Results as above. -Provider to bedside to inform patient of findings. -Patient reports pain improved and now 2/10. -Discussed continued elevation and increasing H2O intake t/o the day. -Patient without questions. -Given letter for approval of dental work as appropriate.  -Encouraged to call primary office or return to MAU if symptoms worsen or with the onset of new symptoms. -Discharged to home in stable condition.  Maryann Conners MSN, CNM Advanced Practice Provider, [facility]

## 2022-09-23 NOTE — MAU Note (Signed)
Pt says she has swelling in her legs and feet - noticed Thurs am

## 2022-09-23 NOTE — Discharge Instructions (Signed)

## 2022-10-02 ENCOUNTER — Encounter: Payer: Self-pay | Admitting: Certified Nurse Midwife

## 2022-10-02 NOTE — Progress Notes (Signed)
Pt called to request traditional midwifery care instead of [facility]. Also asked for cervical exam at next visit due to "bad pelvic and hip pain that feels like contractions." Has taken Tylenol and used a heating pad without relief. Is unsure if she is feeling contractions. Checked her blood pressure and got readings of 144/96, 159/102, 193/152. Advised to go to MAU for evaluation as soon as possible.   Will switch future appointments to traditional CNM visits.   Gaylan Gerold, MSN, CNM, [facility]

## 2022-10-03 ENCOUNTER — Telehealth: Payer: Self-pay

## 2022-10-03 NOTE — Telephone Encounter (Signed)
Jamilla CNM requests patient overbooked for OB visit tomorrow. Scheduled for 3:35 PM. Called pt; VM left with appt details. MyChart message sent for follow up.

## 2022-10-04 ENCOUNTER — Encounter: Payer: Medicaid Other | Admitting: Certified Nurse Midwife

## 2022-10-04 ENCOUNTER — Encounter: Payer: Self-pay | Admitting: Certified Nurse Midwife

## 2022-10-10 ENCOUNTER — Ambulatory Visit (INDEPENDENT_AMBULATORY_CARE_PROVIDER_SITE_OTHER): Payer: Medicaid Other | Admitting: Obstetrics and Gynecology

## 2022-10-10 ENCOUNTER — Other Ambulatory Visit (HOSPITAL_COMMUNITY)
Admission: RE | Admit: 2022-10-10 | Discharge: 2022-10-10 | Disposition: A | Discharge status: Home / Self Care | Payer: Medicaid Other | Source: Ambulatory Visit | Attending: Obstetrics and Gynecology | Admitting: Obstetrics and Gynecology

## 2022-10-10 ENCOUNTER — Encounter: Payer: Self-pay | Admitting: Obstetrics and Gynecology

## 2022-10-10 VITALS — BP 107/70 | HR 93 | Wt 190.3 lb

## 2022-10-10 DIAGNOSIS — O26899 Other specified pregnancy related conditions, unspecified trimester: Secondary | ICD-10-CM

## 2022-10-10 DIAGNOSIS — N939 Abnormal uterine and vaginal bleeding, unspecified: Secondary | ICD-10-CM | POA: Diagnosis present

## 2022-10-10 DIAGNOSIS — O26893 Other specified pregnancy related conditions, third trimester: Secondary | ICD-10-CM

## 2022-10-10 DIAGNOSIS — Z3A29 29 weeks gestation of pregnancy: Secondary | ICD-10-CM

## 2022-10-10 DIAGNOSIS — Z3493 Encounter for supervision of normal pregnancy, unspecified, third trimester: Secondary | ICD-10-CM

## 2022-10-10 DIAGNOSIS — R109 Unspecified abdominal pain: Secondary | ICD-10-CM

## 2022-10-10 LAB — POCT URINALYSIS DIP (DEVICE)
Bilirubin Urine: NEGATIVE
Glucose, UA: NEGATIVE mg/dL
Hgb urine dipstick: NEGATIVE
Ketones, ur: NEGATIVE mg/dL
Nitrite: NEGATIVE
Protein, ur: NEGATIVE mg/dL
Specific Gravity, Urine: 1.02 (ref 1.005–1.030)
Urobilinogen, UA: 1 mg/dL (ref 0.0–1.0)
pH: 7.5 (ref 5.0–8.0)

## 2022-10-10 NOTE — Progress Notes (Signed)
   PRENATAL VISIT NOTE  Subjective:  Heidi Martin is a 22 y.o. [phone] at [redacted]w[redacted]d being seen today for ongoing prenatal care.  She is currently monitored for the following issues for this low-risk pregnancy and has Sickle cell trait; Nausea/vomiting in pregnancy; Supervision of low-risk pregnancy; Alpha thalassemia silent carrier; Carrier of spinal muscular atrophy; Bipolar disorder & ADHD; Migraine headache; History of FGR; and LSIL pap on their problem list.  Patient reports  contractions and spotting .  Contractions: Irritability. Vag. Bleeding: Other (spotting yesterday).  Movement: Present. Denies leaking of fluid.   The following portions of the patient's history were reviewed and updated as appropriate: allergies, current medications, past family history, past medical history, past social history, past surgical history and problem list.   Objective:   Vitals:   10/10/22 1601  BP: 107/70  Pulse: 93  Weight: 190 lb 4.8 oz (86.3 kg)    Fetal Status: Fetal Heart Rate (bpm): 148   Movement: Present     General:  Alert, oriented and cooperative. Patient is in no acute distress.  Skin: Skin is warm and dry. No rash noted.   Cardiovascular: Normal heart rate noted  Respiratory: Normal respiratory effort, no problems with respiration noted  Abdomen: Soft, gravid, appropriate for gestational age.  Pain/Pressure: Present     Pelvic: Cervical exam deferred        Extremities: Normal range of motion.  Edema: None  Mental Status: Normal mood and affect. Normal behavior. Normal judgment and thought content.   Assessment and Plan:  Pregnancy: G4P1021 at [redacted]w[redacted]d 1. [redacted] weeks gestation of pregnancy ***  2. Encounter for supervision of low-risk pregnancy in third trimester ***  3. Vaginal spotting *** - Cervicovaginal ancillary only  Preterm labor symptoms and general obstetric precautions including but not limited to vaginal bleeding, contractions, leaking of fluid and fetal movement were  reviewed in detail with the patient. Please refer to After Visit Summary for other counseling recommendations.   No follow-ups on file.  No future appointments.  Lorriane Shire, MD

## 2022-10-11 ENCOUNTER — Other Ambulatory Visit: Payer: Self-pay | Admitting: Obstetrics and Gynecology

## 2022-10-11 DIAGNOSIS — B3731 Acute candidiasis of vulva and vagina: Secondary | ICD-10-CM

## 2022-10-11 LAB — CERVICOVAGINAL ANCILLARY ONLY
Bacterial Vaginitis (gardnerella): NEGATIVE
Candida Glabrata: NEGATIVE
Candida Vaginitis: POSITIVE — AB
Comment: NEGATIVE
Comment: NEGATIVE
Comment: NEGATIVE

## 2022-10-11 MED ORDER — CLOTRIMAZOLE 1 % VA CREA: 1.0000 | TOPICAL_CREAM | Freq: Every day | VAGINAL | 2 refills | Status: AC

## 2022-10-11 MED ORDER — CYCLOBENZAPRINE HCL 10 MG PO TABS: 10.0000 mg | ORAL_TABLET | Freq: Two times a day (BID) | ORAL | 0 refills | Status: DC | PRN

## 2022-10-24 ENCOUNTER — Telehealth: Payer: Self-pay | Admitting: Family Medicine

## 2022-10-24 NOTE — Telephone Encounter (Signed)
Patient having stomach and vaginal pain

## 2022-10-25 NOTE — Telephone Encounter (Signed)
Left message that if she continues to have questions or concerns to please give the office a call back.    Leonette Nutting

## 2022-10-26 ENCOUNTER — Inpatient Hospital Stay (HOSPITAL_COMMUNITY)
Admission: AD | Admit: 2022-10-26 | Discharge: 2022-10-26 | Disposition: A | Discharge status: Home / Self Care | Payer: Medicaid Other | Attending: Obstetrics and Gynecology | Admitting: Obstetrics and Gynecology

## 2022-10-26 ENCOUNTER — Encounter (HOSPITAL_COMMUNITY): Payer: Self-pay | Admitting: Obstetrics and Gynecology

## 2022-10-26 DIAGNOSIS — Z3493 Encounter for supervision of normal pregnancy, unspecified, third trimester: Secondary | ICD-10-CM

## 2022-10-26 DIAGNOSIS — Z8616 Personal history of COVID-19: Secondary | ICD-10-CM | POA: Diagnosis not present

## 2022-10-26 DIAGNOSIS — Z3A32 32 weeks gestation of pregnancy: Secondary | ICD-10-CM | POA: Diagnosis not present

## 2022-10-26 DIAGNOSIS — R102 Pelvic and perineal pain: Secondary | ICD-10-CM | POA: Insufficient documentation

## 2022-10-26 DIAGNOSIS — O26893 Other specified pregnancy related conditions, third trimester: Secondary | ICD-10-CM | POA: Diagnosis not present

## 2022-10-26 MED ORDER — CYCLOBENZAPRINE HCL 5 MG PO TABS
10.0000 mg | ORAL_TABLET | Freq: Once | ORAL | Status: AC
  Administered 2022-10-26: 10 mg via ORAL
  Filled 2022-10-26: qty 2

## 2022-10-26 MED ORDER — ACETAMINOPHEN 500 MG PO TABS
1000.0000 mg | ORAL_TABLET | Freq: Once | ORAL | Status: AC
  Administered 2022-10-26: 1000 mg via ORAL
  Filled 2022-10-26: qty 2

## 2022-10-26 NOTE — MAU Note (Addendum)
...  Heidi Martin is a 22 y.o. at [redacted]w[redacted]d here in MAU reporting: Constant lower abdominal pain that began yesterday that is at times worse. She reports when the pain becomes worse it feels like a sharp pain. She also endorses ongoing bilateral hip pain that she has been experiencing her entire pregnancy. Denies VB or LOF. +FM.  -Patient unable to void at this time. -SVE Closed in office on 4/9. -Patient dx with a yeast infection on 4/9 but was never sent a rx. -Last took Flexeril at 0500 this morning. She reports it "did not really" help.  Onset of complaint: Yesterday Pain score: 8/10 lower abdomen constant pain - 10/10 intermittent lower abdominal pain  FHT: 139 initial external Lab orders placed from triage: UA

## 2022-10-26 NOTE — MAU Provider Note (Signed)
History     CSN: [id number]  Arrival date and time: 10/26/22 1836   None     Chief Complaint  Patient presents with   Abdominal Pain   Contractions   HPI Heidi Martin is a 22 y.o. [id number] at [redacted]w[redacted]d who presents to MAU via EMS for lower abdominal, pelvic, and hip pain. Pain started yesterday morning an has been constant. Nothing makes the pain better or worse. She reports she has had this pain before. She took 2 ES Tylenol this morning which did not help. She has a prescription for Flexeril but has not taken it because "it does not help". She denies bleeding, leaking fluid, or urinary s/s. No itching or odor. She reports normal fetal movement.   Patient receives [facility], next appointment is scheduled on 5/1.   OB History     Gravida  4   Para  1   Term  1   Preterm      AB  2   Living  1      SAB  1   IAB  1   Ectopic      Multiple  0   Live Births  1           Past Medical History:  Diagnosis Date   Alpha thalassemia silent carrier    Chronic intractable headache 10/13/2014   COVID-19    Fatigue 05/31/2015   Irritable bowel syndrome (IBS)    Musculoskeletal pain 05/31/2015   Sickle cell trait ([facility])     Past Surgical History:  Procedure Laterality Date   DILATION AND EVACUATION N/A 10/27/2021   Procedure: DILATATION AND EVACUATION;  Surgeon: Mylo Bing, MD;  Location: [facility];  Service: Gynecology;  Laterality: N/A;    Family History  Problem Relation Age of Onset   Cancer Mother    Depression Mother    Miscarriages / Stillbirths Mother    Hypertension Mother    Hypertension Father    Cancer Maternal Grandmother    Diabetes Maternal Grandmother    Miscarriages / Stillbirths Maternal Grandmother     Social History   Tobacco Use   Smoking status: Never   Smokeless tobacco: Never  Vaping Use   Vaping Use: Never used  Substance Use Topics   Alcohol use: No   Drug use: No    Allergies:  Allergies  Allergen Reactions    Milk [Milk ([place]ow)] Nausea And Vomiting    Stomach cramping    Medications Prior to Admission  Medication Sig Dispense Refill Last Dose   cyclobenzaprine (FLEXERIL) 10 MG tablet Take 1 tablet (10 mg total) by mouth 2 (two) times daily as needed for muscle spasms. 20 tablet 0 10/26/2022 at 0500   acetaminophen (TYLENOL) 500 MG tablet Take 500 mg by mouth every 6 (six) hours as needed.      acetaminophen-codeine (TYLENOL #3) 300-30 MG tablet Take by mouth.      amoxicillin (AMOXIL) 500 MG tablet Take 500 mg by mouth 3 (three) times daily.      Blood Pressure Monitoring (BLOOD PRESSURE KIT) DEVI 1 each by Does not apply route 3 times/day as needed-between meals & bedtime. 1 each 0    chlorhexidine (PERIDEX) 0.12 % solution SMARTSIG:By Mouth      indomethacin (INDOCIN) 25 MG capsule Take by mouth 2 (two) times daily.      Misc. Devices (GOJJI WEIGHT SCALE) MISC 1 each by Does not apply route as needed. 1 each 0  predniSONE (DELTASONE) 2.5 MG tablet Take by mouth.      Prenat-FeFum-FePo-FA-Omega 3 (CONCEPT DHA) 53.5-38-1 MG CAPS Take 1 tablet by mouth daily. 30 capsule 12    promethazine (PHENERGAN) 25 MG tablet Take 1 tablet (25 mg total) by mouth every 6 (six) hours as needed for nausea or vomiting. 30 tablet 3    pyridOXINE (VITAMIN B6) 25 MG tablet 1 tablet po tid 30 tablet 3    Review of Systems  Constitutional: Negative.   Gastrointestinal:  Positive for abdominal pain.  Genitourinary:  Positive for pelvic pain.  All other systems reviewed and are negative.  Physical Exam   Blood pressure 116/67, pulse 91, temperature 97.8 F (36.6 C), temperature source Oral, resp. rate 15, last menstrual period 03/24/2022, SpO2 100 %, unknown if currently breastfeeding.  Physical Exam Vitals and nursing note reviewed. Exam conducted with a chaperone present.  Constitutional:      General: She is not in acute distress. Cardiovascular:     Rate and Rhythm: Normal rate.  Pulmonary:     Effort:  Pulmonary effort is normal. No respiratory distress.  Abdominal:     Palpations: Abdomen is soft.     Tenderness: There is no abdominal tenderness.     Comments: Gravid   Skin:    General: Skin is warm and dry.  Neurological:     General: No focal deficit present.     Mental Status: She is alert and oriented to person, place, and time.  Psychiatric:        Mood and Affect: Mood normal.        Behavior: Behavior normal.   Dilation: 1 Effacement (%): 50 Station: -3 Presentation: Vertex Exam by:: Camelia Eng, CNM  NST FHR: 130 bpm, moderate variability, +15x15 accels, no decels Toco: occasional ui  MAU Course  Procedures  MDM Tylenol, Flexeril NST  Patient given Tylenol and Flexeril. Cervix 1cm. Recheck 1.5 hours later and cervix remains unchanged. Patient reports improvement in pain after medication. NST reactive. Toco with occasional ui but otherwise no contractions. Low suspicion for preterm labor.   Assessment and Plan   1. Encounter for supervision of low-risk pregnancy in third trimester   2. [redacted] weeks gestation of pregnancy   3. Pelvic pain affecting pregnancy in third trimester, antepartum    - Discharge home in stable condition - Advised support belt/KT tape, continue Flexeril/Tylenol. Encouraged adequate PO hydration. Patient requesting pelvic floor PT, will place referral - Keep OB appointment as scheduled on 5/1. Return to MAU as needed for new/worsening symptoms   Brand Males, CNM 10/26/2022, 10:09 PM

## 2022-10-31 ENCOUNTER — Encounter: Payer: Medicaid Other | Admitting: Student

## 2022-11-01 ENCOUNTER — Encounter: Payer: Self-pay | Admitting: Obstetrics & Gynecology

## 2022-11-01 ENCOUNTER — Ambulatory Visit (INDEPENDENT_AMBULATORY_CARE_PROVIDER_SITE_OTHER): Payer: Medicaid Other | Admitting: Obstetrics & Gynecology

## 2022-11-01 ENCOUNTER — Other Ambulatory Visit: Payer: Self-pay

## 2022-11-01 VITALS — BP 106/73 | HR 92 | Wt 195.0 lb

## 2022-11-01 DIAGNOSIS — R102 Pelvic and perineal pain: Secondary | ICD-10-CM

## 2022-11-01 DIAGNOSIS — O26893 Other specified pregnancy related conditions, third trimester: Secondary | ICD-10-CM

## 2022-11-01 DIAGNOSIS — Z3493 Encounter for supervision of normal pregnancy, unspecified, third trimester: Secondary | ICD-10-CM

## 2022-11-01 DIAGNOSIS — N39498 Other specified urinary incontinence: Secondary | ICD-10-CM

## 2022-11-01 DIAGNOSIS — Z3A33 33 weeks gestation of pregnancy: Secondary | ICD-10-CM

## 2022-11-01 MED ORDER — CYCLOBENZAPRINE HCL 10 MG PO TABS: 10.0000 mg | ORAL_TABLET | Freq: Three times a day (TID) | ORAL | 1 refills | Status: DC | PRN

## 2022-11-01 NOTE — Progress Notes (Signed)
   PRENATAL VISIT NOTE  Subjective:  Heidi Martin is a 22 y.o. [phone] at [redacted]w[redacted]d being seen today for ongoing prenatal care.  She is currently monitored for the following issues for this low-risk pregnancy and has Sickle cell trait (HCC); Nausea/vomiting in pregnancy; Supervision of low-risk pregnancy; Alpha thalassemia silent carrier; Carrier of spinal muscular atrophy; Bipolar disorder & ADHD; Migraine headache; History of FGR; and LSIL pap on their problem list.  Patient reports  urine incontinence. No dysuria, no suprapubic pain or back pain   Does report pelvic/hip pain, desires Flexeril refill.   Contractions: Not present. Vag. Bleeding: None.  Movement: Present. Denies leaking of fluid.   The following portions of the patient's history were reviewed and updated as appropriate: allergies, current medications, past family history, past medical history, past social history, past surgical history and problem list.   Objective:   Vitals:   11/01/22 1642  BP: 106/73  Pulse: 92  Weight: 195 lb (88.5 kg)    Fetal Status: Fetal Heart Rate (bpm): 144   Movement: Present     General:  Alert, oriented and cooperative. Patient is in no acute distress.  Skin: Skin is warm and dry. No rash noted.   Cardiovascular: Normal heart rate noted  Respiratory: Normal respiratory effort, no problems with respiration noted  Abdomen: Soft, gravid, appropriate for gestational age.  Pain/Pressure: Present     Pelvic: Exam performed in the presence of a chaperone, no pooling of fluid. Closed cervix        Extremities: Normal range of motion.  Edema: None  Mental Status: Normal mood and affect. Normal behavior. Normal judgment and thought content.   Assessment and Plan:  Pregnancy: [id number] at [redacted]w[redacted]d 1. Pelvic pain affecting pregnancy in third trimester, antepartum Flexeril refilled.  - cyclobenzaprine (FLEXERIL) 10 MG tablet; Take 1 tablet (10 mg total) by mouth 3 (three) times daily as needed for muscle  spasms.  Dispense: 30 tablet; Refill: 1  2. Other urinary incontinence Likely due to gravid uterus effect on bladder. No symptoms of infection, will follow up culture results. Will continue to monitor.  - Culture, OB Urine  3. [redacted] weeks gestation of pregnancy 4. Encounter for supervision of low-risk pregnancy in third trimester Preterm labor symptoms and general obstetric precautions including but not limited to vaginal bleeding, contractions, leaking of fluid and fetal movement were reviewed in detail with the patient. Please refer to After Visit Summary for other counseling recommendations.   Return in about 2 weeks (around 11/15/2022) for OFFICE OB VISIT (MD or APP).  Future Appointments  Date Time Provider Department Center  11/15/2022  4:15 PM [facility]  11/22/2022  3:55 PM Bernerd Limbo, CNM [facility]    Jaynie Collins, MD

## 2022-11-01 NOTE — Patient Instructions (Signed)
Return to office for any scheduled appointments. Call the office or go to the MAU at [facility] if: You begin to have strong, frequent contractions Your water breaks.  Sometimes it is a big gush of fluid, sometimes it is just a trickle that keeps getting your underwear wet or running down your legs You have vaginal bleeding.  It is normal to have a small amount of spotting if your cervix was checked.  You do not feel your baby moving like normal.  If you do not, get something to eat and drink and lay down and focus on feeling your baby move.   If your baby is still not moving like normal, you should call the office or go to MAU. Any other obstetric concerns.  

## 2022-11-03 LAB — URINE CULTURE, OB REFLEX

## 2022-11-03 LAB — CULTURE, OB URINE

## 2022-11-08 ENCOUNTER — Encounter (HOSPITAL_COMMUNITY): Payer: Self-pay | Admitting: Obstetrics & Gynecology

## 2022-11-08 ENCOUNTER — Inpatient Hospital Stay (HOSPITAL_COMMUNITY)
Admission: AD | Admit: 2022-11-08 | Discharge: 2022-11-08 | Disposition: A | Discharge status: Home / Self Care | Payer: Medicaid Other | Attending: Obstetrics & Gynecology | Admitting: Obstetrics & Gynecology

## 2022-11-08 DIAGNOSIS — O99613 Diseases of the digestive system complicating pregnancy, third trimester: Secondary | ICD-10-CM | POA: Diagnosis not present

## 2022-11-08 DIAGNOSIS — N898 Other specified noninflammatory disorders of vagina: Secondary | ICD-10-CM

## 2022-11-08 DIAGNOSIS — O99013 Anemia complicating pregnancy, third trimester: Secondary | ICD-10-CM | POA: Diagnosis not present

## 2022-11-08 DIAGNOSIS — R102 Pelvic and perineal pain: Secondary | ICD-10-CM | POA: Diagnosis not present

## 2022-11-08 DIAGNOSIS — Z3A34 34 weeks gestation of pregnancy: Secondary | ICD-10-CM | POA: Diagnosis not present

## 2022-11-08 DIAGNOSIS — O26893 Other specified pregnancy related conditions, third trimester: Secondary | ICD-10-CM | POA: Diagnosis present

## 2022-11-08 HISTORY — DX: Trichomoniasis, unspecified: A59.9

## 2022-11-08 HISTORY — DX: Acute parametritis and pelvic cellulitis: N73.0

## 2022-11-08 HISTORY — DX: Depression, unspecified: F32.A

## 2022-11-08 HISTORY — DX: Urinary tract infection, site not specified: N39.0

## 2022-11-08 LAB — WET PREP, GENITAL
Sperm: NONE SEEN
Trich, Wet Prep: NONE SEEN
WBC, Wet Prep HPF POC: >=10 — AB (ref <10)
Yeast Wet Prep HPF POC: NONE SEEN

## 2022-11-08 LAB — URINALYSIS, ROUTINE W REFLEX MICROSCOPIC
Bilirubin Urine: NEGATIVE
Glucose, UA: NEGATIVE mg/dL
Hgb urine dipstick: NEGATIVE
Ketones, ur: NEGATIVE mg/dL
Nitrite: NEGATIVE
Protein, ur: NEGATIVE mg/dL
Specific Gravity, Urine: 1.011 (ref 1.005–1.030)
pH: 7 (ref 5.0–8.0)

## 2022-11-08 MED ORDER — OXYCODONE HCL 5 MG PO TABS
5.0000 mg | ORAL_TABLET | Freq: Once | ORAL | Status: AC
  Administered 2022-11-08: 5 mg via ORAL
  Filled 2022-11-08: qty 1

## 2022-11-08 MED ORDER — VALACYCLOVIR HCL 1 G PO TABS: 1000.0000 mg | ORAL_TABLET | Freq: Two times a day (BID) | ORAL | 0 refills | Status: AC

## 2022-11-08 MED ORDER — LACTATED RINGERS IV BOLUS
1000.0000 mL | Freq: Once | INTRAVENOUS | Status: AC
  Administered 2022-11-08: 1000 mL via INTRAVENOUS

## 2022-11-08 NOTE — MAU Note (Signed)
Heidi Martin is a 22 y.o. at [redacted]w[redacted]d here in MAU reporting: having pain in her lower abd, feels like she is on her period.  Vaginal area is hurting really bad too. No bleeding or LOF.  Reports +FM. +D?C- creamy white, no odor. Denies recent intercourse  Onset of complaint: yesterday Pain score: abd 7/vag 8 Vitals:   11/08/22 1839  BP: 118/70  Pulse: (!) 105  Resp: 16  Temp: 98 F (36.7 C)  SpO2: 100%     [id number]:[id number] +FM, fitted dress on Lab orders placed from triage:  UA

## 2022-11-08 NOTE — MAU Provider Note (Signed)
History     CSN: [id number]  Arrival date and time: 11/08/22 1806   Event Date/Time   First Provider Initiated Contact with Patient 11/08/22 1915      Chief Complaint  Patient presents with   Abdominal Pain   Vaginal Pain   Heidi Martin is a 22 y.o. [id number] at [redacted]w[redacted]d who presents today with vaginal pain. She states that it started this morning and has been 8/10 pain in her vagina. She has also had some cramping. She denies VB or  LOF. She reports normal fetal movement. She has also noticed an increased in vaginal discharge.   Vaginal Pain The patient's primary symptoms include genital lesions, pelvic pain and vaginal discharge. This is a new problem. The current episode started today. The problem occurs constantly. The problem has been unchanged. The pain is severe. The problem affects both sides. She is pregnant. The vaginal discharge was white. There has been no bleeding. Nothing aggravates the symptoms. She has tried acetaminophen for the symptoms. The treatment provided no relief.    OB History     Gravida  4   Para  1   Term  1   Preterm      AB  2   Living  1      SAB  1   IAB  1   Ectopic      Multiple  0   Live Births  1           Past Medical History:  Diagnosis Date   Alpha thalassemia silent carrier    Chronic intractable headache 10/13/2014   COVID-19    Depression    Fatigue 05/31/2015   Irritable bowel syndrome (IBS)    Musculoskeletal pain 05/31/2015   PID (acute pelvic inflammatory disease)    Sickle cell trait (HCC)    Trichomonas infection    UTI (urinary tract infection)     Past Surgical History:  Procedure Laterality Date   DILATION AND EVACUATION N/A 10/27/2021   Procedure: DILATATION AND EVACUATION;  Surgeon: Colon Bing, MD;  Location: [facility] OR;  Service: Gynecology;  Laterality: N/A;    Family History  Problem Relation Age of Onset   Cancer Mother    Depression Mother    Miscarriages / Stillbirths Mother     Hypertension Mother    Hypertension Father    Cancer Maternal Grandmother    Diabetes Maternal Grandmother    Miscarriages / Stillbirths Maternal Grandmother     Social History   Tobacco Use   Smoking status: Never   Smokeless tobacco: Never  Vaping Use   Vaping Use: Never used  Substance Use Topics   Alcohol use: No   Drug use: No    Allergies:  Allergies  Allergen Reactions   Milk [Milk (Cow)] Nausea And Vomiting    Stomach cramping    Medications Prior to Admission  Medication Sig Dispense Refill Last Dose   acetaminophen (TYLENOL) 500 MG tablet Take 500 mg by mouth every 6 (six) hours as needed.   11/08/2022 at 1630   cyclobenzaprine (FLEXERIL) 10 MG tablet Take 1 tablet (10 mg total) by mouth 3 (three) times daily as needed for muscle spasms. 30 tablet 1 11/06/2022   Prenat-FeFum-FePo-FA-Omega 3 (CONCEPT DHA) 53.5-38-1 MG CAPS Take 1 tablet by mouth daily. 30 capsule 12 11/07/2022   Blood Pressure Monitoring (BLOOD PRESSURE KIT) DEVI 1 each by Does not apply route 3 times/day as needed-between meals & bedtime. 1 each 0  chlorhexidine (PERIDEX) 0.12 % solution SMARTSIG:By Mouth      Misc. Devices (GOJJI WEIGHT SCALE) MISC 1 each by Does not apply route as needed. 1 each 0     Review of Systems  Genitourinary:  Positive for pelvic pain and vaginal discharge.  All other systems reviewed and are negative.  Physical Exam   Blood pressure 115/70, pulse 99, temperature 98 F (36.7 C), temperature source Oral, resp. rate 16, height 5\' 7"  (1.702 m), weight 90.2 kg, last menstrual period 03/24/2022, SpO2 100 %, unknown if currently breastfeeding.  Physical Exam Constitutional:      Appearance: She is well-developed.  HENT:     Head: Normocephalic.  Eyes:     Pupils: Pupils are equal, round, and reactive to light.  Cardiovascular:     Rate and Rhythm: Normal rate and regular rhythm.     Heart sounds: Normal heart sounds.  Pulmonary:     Effort: Pulmonary effort is  normal. No respiratory distress.     Breath sounds: Normal breath sounds.  Abdominal:     Palpations: Abdomen is soft.     Tenderness: There is no abdominal tenderness.  Genitourinary:    Vagina: No bleeding. Vaginal discharge: mucusy.    Comments: External: no lesion Vagina: large amount of white discharge Cervix: multiple small herpetic appearing lesions that bled and were very painful with minimal contact Dilation: 1 Effacement (%): 50 Cervical Position: Middle Station: -2   Musculoskeletal:        General: Normal range of motion.     Cervical back: Normal range of motion and neck supple.  Skin:    General: Skin is warm and dry.  Neurological:     Mental Status: She is alert and oriented to person, place, and time.  Psychiatric:        Mood and Affect: Mood normal.        Behavior: Behavior normal.   NST:  Baseline: 135 Variability: moderate Accels: 15x15 Decels: none Toco: UI with irregular contractions  Reactive/Appropriate for GA  Results for orders placed or performed during the hospital encounter of 11/08/22 (from the past 24 hour(s))  Urinalysis, Routine w reflex microscopic -Urine, Clean Catch     Status: Abnormal   Collection Time: 11/08/22  6:50 PM  Result Value Ref Range   Color, Urine YELLOW YELLOW   APPearance HAZY (A) CLEAR   Specific Gravity, Urine 1.011 1.005 - 1.030   pH 7.0 5.0 - 8.0   Glucose, UA NEGATIVE NEGATIVE mg/dL   Hgb urine dipstick NEGATIVE NEGATIVE   Bilirubin Urine NEGATIVE NEGATIVE   Ketones, ur NEGATIVE NEGATIVE mg/dL   Protein, ur NEGATIVE NEGATIVE mg/dL   Nitrite NEGATIVE NEGATIVE   Leukocytes,Ua MODERATE (A) NEGATIVE   RBC / HPF 0-5 0 - 5 RBC/hpf   WBC, UA 0-5 0 - 5 WBC/hpf   Bacteria, UA FEW (A) NONE SEEN   Squamous Epithelial / HPF 6-10 0 - 5 /HPF   Mucus PRESENT   Wet prep, genital     Status: Abnormal   Collection Time: 11/08/22  7:46 PM   Specimen: Cervix  Result Value Ref Range   Yeast Wet Prep HPF POC NONE SEEN  NONE SEEN   Trich, Wet Prep NONE SEEN NONE SEEN   Clue Cells Wet Prep HPF POC PRESENT (A) NONE SEEN   WBC, Wet Prep HPF POC >=10 (A) <10   Sperm NONE SEEN      MAU Course  Procedures  MDM  No cervical  change over several hours   DW patient that it could take a couple of days to get HSV PCR back, but that we should start treatment at this time due to risks of not treating and minimal risk associated with treatment even if HSV PCR comes back negative.   Assessment and Plan   1. Vaginal pain   2. Vaginal discharge during pregnancy in third trimester   3. [redacted] weeks gestation of pregnancy    DC home in stable condition  3rd Trimester precautions  PTL precautions  Fetal kick counts RX: valtrex 1000mg  BID x 7 days  Return to MAU as needed FU with OB as planned   Follow-up Information     [facility] Follow up.   Specialty: Obstetrics and Gynecology Why: As scheduled Contact information: [address] [place] [address] [phone]               Thressa Sheller DNP, CNM  11/08/22  9:53 PM

## 2022-11-09 ENCOUNTER — Telehealth: Payer: Self-pay | Admitting: Certified Nurse Midwife

## 2022-11-09 ENCOUNTER — Telehealth: Payer: Self-pay | Admitting: Clinical

## 2022-11-09 DIAGNOSIS — R4589 Other symptoms and signs involving emotional state: Secondary | ICD-10-CM

## 2022-11-09 DIAGNOSIS — N760 Acute vaginitis: Secondary | ICD-10-CM

## 2022-11-09 DIAGNOSIS — B9689 Other specified bacterial agents as the cause of diseases classified elsewhere: Secondary | ICD-10-CM

## 2022-11-09 LAB — GC/CHLAMYDIA PROBE AMP (~~LOC~~) NOT AT ARMC
Chlamydia: NEGATIVE
Comment: NEGATIVE
Comment: NORMAL
Neisseria Gonorrhea: NEGATIVE

## 2022-11-09 LAB — HSV 1/2 PCR (SURFACE)
HSV-1 DNA: NOT DETECTED
HSV-2 DNA: NOT DETECTED

## 2022-11-09 MED ORDER — METRONIDAZOLE 500 MG PO TABS
500.0000 mg | ORAL_TABLET | Freq: Two times a day (BID) | ORAL | 0 refills | Status: DC
Start: 2022-11-09 — End: 2022-11-22

## 2022-11-09 NOTE — Telephone Encounter (Signed)
Pt reached out to discuss her MAU visit from the night before. Clinically diagnosed with HSV outbreak, awaiting culture, feeling very confused and upset by diagnosis. Reviewed the findings with her and strongly affirmed the CNMs diagnosis and the need for and safety of treatment while waiting on results. Strongly encouraged pt to take the meds, pt agreed. Pt had noted the abnormal wet prep and requested treatment for BV, which I sent in.  Pt also verbalized feeling like her depressive and anxious symptoms have increased as her pregnancy has progressed and this diagnosis is not helping. Amenable to seeing our IBH and potentially starting on a medication. Referral placed and will send an urgent message to [facility] to get her in quickly. Offered vistaril for anxiety but can prescribe maintenance meds once she's seen behavioral health or we can discuss further at her next visit. Much encouragement given, encouraged pt to go to [facility] if emergent needs arise. Pt agreed.  Edd Arbour, CNM, MSN, IBCLC Certified Nurse Midwife, [facility]

## 2022-11-09 NOTE — Telephone Encounter (Signed)
Attempt call regarding referral; Left HIPPA-compliant message to call back Jamarrius Salay from [facility] at  [phone] (Azharia Surratt's office).   

## 2022-11-10 ENCOUNTER — Telehealth: Payer: Self-pay | Admitting: Clinical

## 2022-11-10 NOTE — Telephone Encounter (Signed)
Attempt call regarding referral; Left HIPPA-compliant message to call back Yaqueline Gutter from [facility] at  [phone] (Albirtha Grinage's office).   

## 2022-11-15 ENCOUNTER — Ambulatory Visit (INDEPENDENT_AMBULATORY_CARE_PROVIDER_SITE_OTHER): Payer: Medicaid Other | Admitting: Certified Nurse Midwife

## 2022-11-15 ENCOUNTER — Encounter (HOSPITAL_COMMUNITY): Payer: Self-pay | Admitting: Obstetrics & Gynecology

## 2022-11-15 ENCOUNTER — Other Ambulatory Visit: Payer: Self-pay

## 2022-11-15 ENCOUNTER — Other Ambulatory Visit (HOSPITAL_COMMUNITY)
Admission: RE | Admit: 2022-11-15 | Discharge: 2022-11-15 | Disposition: A | Discharge status: Home / Self Care | Payer: Medicaid Other | Source: Ambulatory Visit | Attending: Certified Nurse Midwife | Admitting: Certified Nurse Midwife

## 2022-11-15 ENCOUNTER — Inpatient Hospital Stay (HOSPITAL_COMMUNITY)
Admission: AD | Admit: 2022-11-15 | Discharge: 2022-11-15 | Disposition: A | Discharge status: Home / Self Care | Payer: Medicaid Other | Attending: Obstetrics & Gynecology | Admitting: Obstetrics & Gynecology

## 2022-11-15 VITALS — BP 128/92 | HR 98 | Wt 198.0 lb

## 2022-11-15 DIAGNOSIS — O479 False labor, unspecified: Secondary | ICD-10-CM

## 2022-11-15 DIAGNOSIS — Z3A35 35 weeks gestation of pregnancy: Secondary | ICD-10-CM | POA: Diagnosis not present

## 2022-11-15 DIAGNOSIS — Z0371 Encounter for suspected problem with amniotic cavity and membrane ruled out: Secondary | ICD-10-CM

## 2022-11-15 DIAGNOSIS — O26893 Other specified pregnancy related conditions, third trimester: Secondary | ICD-10-CM | POA: Insufficient documentation

## 2022-11-15 DIAGNOSIS — Z3483 Encounter for supervision of other normal pregnancy, third trimester: Secondary | ICD-10-CM | POA: Diagnosis not present

## 2022-11-15 DIAGNOSIS — O99613 Diseases of the digestive system complicating pregnancy, third trimester: Secondary | ICD-10-CM | POA: Diagnosis not present

## 2022-11-15 DIAGNOSIS — Z3493 Encounter for supervision of normal pregnancy, unspecified, third trimester: Secondary | ICD-10-CM

## 2022-11-15 DIAGNOSIS — R110 Nausea: Secondary | ICD-10-CM | POA: Diagnosis not present

## 2022-11-15 DIAGNOSIS — Z8616 Personal history of COVID-19: Secondary | ICD-10-CM | POA: Insufficient documentation

## 2022-11-15 DIAGNOSIS — K219 Gastro-esophageal reflux disease without esophagitis: Secondary | ICD-10-CM | POA: Diagnosis not present

## 2022-11-15 DIAGNOSIS — O4700 False labor before 37 completed weeks of gestation, unspecified trimester: Secondary | ICD-10-CM

## 2022-11-15 LAB — WET PREP, GENITAL
Clue Cells Wet Prep HPF POC: NONE SEEN
Sperm: NONE SEEN
Trich, Wet Prep: NONE SEEN
WBC, Wet Prep HPF POC: >=10 — AB (ref <10)
Yeast Wet Prep HPF POC: NONE SEEN

## 2022-11-15 LAB — POCT FERN TEST: POCT Fern Test: NEGATIVE

## 2022-11-15 MED ORDER — ALUM & MAG HYDROXIDE-SIMETH 200-200-20 MG/5ML PO SUSP
30.0000 mL | Freq: Once | ORAL | Status: AC
  Administered 2022-11-15: 30 mL via ORAL
  Filled 2022-11-15: qty 30

## 2022-11-15 MED ORDER — ONDANSETRON 4 MG PO TBDP
4.0000 mg | ORAL_TABLET | Freq: Once | ORAL | Status: AC
  Administered 2022-11-15: 4 mg via ORAL
  Filled 2022-11-15: qty 1

## 2022-11-15 MED ORDER — LACTATED RINGERS IV BOLUS
1000.0000 mL | Freq: Once | INTRAVENOUS | Status: AC
  Administered 2022-11-15: 1000 mL via INTRAVENOUS

## 2022-11-15 MED ORDER — FENTANYL CITRATE (PF) 100 MCG/2ML IJ SOLN
100.0000 ug | Freq: Once | INTRAMUSCULAR | Status: AC
  Administered 2022-11-15: 100 ug via INTRAVENOUS
  Filled 2022-11-15: qty 2

## 2022-11-15 NOTE — MAU Provider Note (Signed)
History     [id number]  Arrival date and time: 11/15/22 1740    Chief Complaint  Patient presents with   Contractions     HPI Heidi Martin is a 22 y.o. at [redacted]w[redacted]d who presents for contractions and leaking fluid.   Patient was seen earlier today in clinic and reported low back pain and rectal pressure. Also reported possible leaking fluid. In the office she was checked and found to be 3.5/80/-1 (was 1 cm a week ago in MAU), sent to MAU EMS given c/f ROM and labor  To me reports painful contractions since this morning Last night had a gush of fluid and some ongoing leaking but mostly of mucous  Some has been blood tinged but no frank vaginal bleeding Fetal movement is normal Also endorsing some nausea and central chest pain   O/Positive/-- (12/11 1009)  OB History     Gravida  4   Para  1   Term  1   Preterm      AB  2   Living  1      SAB  1   IAB  1   Ectopic      Multiple  0   Live Births  1           Past Medical History:  Diagnosis Date   Alpha thalassemia silent carrier    Chronic intractable headache 10/13/2014   COVID-19    Depression    Fatigue 05/31/2015   Irritable bowel syndrome (IBS)    Musculoskeletal pain 05/31/2015   PID (acute pelvic inflammatory disease)    Sickle cell trait (HCC)    Trichomonas infection    UTI (urinary tract infection)     Past Surgical History:  Procedure Laterality Date   DILATION AND EVACUATION N/A 10/27/2021   Procedure: DILATATION AND EVACUATION;  Surgeon: Wingate Bing, MD;  Location: [facility] OR;  Service: Gynecology;  Laterality: N/A;    Family History  Problem Relation Age of Onset   Cancer Mother    Depression Mother    Miscarriages / Stillbirths Mother    Hypertension Mother    Hypertension Father    Cancer Maternal Grandmother    Diabetes Maternal Grandmother    Miscarriages / Stillbirths Maternal Grandmother     Social History   Socioeconomic History   Marital status: Single     Spouse name: Not on file   Number of children: Not on file   Years of education: Not on file   Highest education level: Not on file  Occupational History   Not on file  Tobacco Use   Smoking status: Never   Smokeless tobacco: Never  Vaping Use   Vaping Use: Never used  Substance and Sexual Activity   Alcohol use: No   Drug use: No   Sexual activity: Yes    Birth control/protection: None    Comment: last IC Saturday 11/11/22  Other Topics Concern   Not on file  Social History Narrative   Not on file   Social Determinants of Health   Financial Resource Strain: Not on file  Food Insecurity: No Food Insecurity (01/05/2021)   Hunger Vital Sign    Worried About Running Out of Food in the Last Year: Never true    Ran Out of Food in the Last Year: Never true  Transportation Needs: No Transportation Needs (01/05/2021)   PRAPARE - Administrator, Civil Service (Medical): No    Lack of Transportation (Non-Medical):  No  Recent Concern: Transportation Needs - Unmet Transportation Needs (12/06/2020)   PRAPARE - Administrator, Civil Service (Medical): Yes    Lack of Transportation (Non-Medical): Yes  Physical Activity: Not on file  Stress: Not on file  Social Connections: Not on file  Intimate Partner Violence: Not on file    Allergies  Allergen Reactions   Milk [Milk (Cow)] Nausea And Vomiting    Stomach cramping    No current facility-administered medications on file prior to encounter.   Current Outpatient Medications on File Prior to Encounter  Medication Sig Dispense Refill   acetaminophen (TYLENOL) 500 MG tablet Take 500 mg by mouth every 6 (six) hours as needed.     Prenat-FeFum-FePo-FA-Omega 3 (CONCEPT DHA) 53.5-38-1 MG CAPS Take 1 tablet by mouth daily. 30 capsule 12   Blood Pressure Monitoring (BLOOD PRESSURE KIT) DEVI 1 each by Does not apply route 3 times/day as needed-between meals & bedtime. 1 each 0   cyclobenzaprine (FLEXERIL) 10 MG tablet Take  1 tablet (10 mg total) by mouth 3 (three) times daily as needed for muscle spasms. 30 tablet 1   metroNIDAZOLE (FLAGYL) 500 MG tablet Take 1 tablet (500 mg total) by mouth 2 (two) times daily. 14 tablet 0   Misc. Devices (GOJJI WEIGHT SCALE) MISC 1 each by Does not apply route as needed. 1 each 0   valACYclovir (VALTREX) 1000 MG tablet Take 1 tablet (1,000 mg total) by mouth 2 (two) times daily for 7 days. 14 tablet 0   [DISCONTINUED] dicyclomine (BENTYL) 20 MG tablet Take 1 tablet (20 mg total) by mouth 2 (two) times daily. (Patient not taking: No sig reported) 20 tablet 0     ROS Pertinent positives and negative per HPI, all others reviewed and negative  Physical Exam   BP 115/74   Pulse 100   Temp 97.8 F (36.6 C) (Oral)   Resp 19   LMP 03/24/2022 (Exact Date)   SpO2 99%   Patient Vitals for the past 24 hrs:  BP Temp Temp src Pulse Resp SpO2  11/15/22 1750 -- -- -- -- -- 99 %  11/15/22 1749 115/74 97.8 F (36.6 C) Oral 100 19 100 %    Physical Exam Vitals reviewed.  Constitutional:      General: She is not in acute distress.    Appearance: She is well-developed. She is not diaphoretic.  Eyes:     General: No scleral icterus. Pulmonary:     Effort: Pulmonary effort is normal. No respiratory distress.  Abdominal:     Comments: gravid  Genitourinary:    Comments: Neg pool, neg valsalva, neg fern Skin:    General: Skin is warm and dry.  Neurological:     Mental Status: She is alert.     Coordination: Coordination normal.      Cervical Exam Dilation: 3.5 Effacement (%): 80 Station: -1 Presentation: Vertex Exam by:: Crissie Reese MD  Bedside Ultrasound Not performed.  My interpretation: n/a  FHT Baseline: 125 bpm Variability: Good {> 6 bpm) Accelerations: Reactive Decelerations: Absent Uterine activity: initially regular, now irritability Cat: I  Labs Results for orders placed or performed during the hospital encounter of 11/15/22 (from the past 24  hour(s))  Wet prep, genital     Status: Abnormal   Collection Time: 11/15/22  6:18 PM   Specimen: PATH Cytology Cervicovaginal Ancillary Only  Result Value Ref Range   Yeast Wet Prep HPF POC NONE SEEN NONE SEEN   Trich, Wet  Prep NONE SEEN NONE SEEN   Clue Cells Wet Prep HPF POC NONE SEEN NONE SEEN   WBC, Wet Prep HPF POC >=10 (A) <10   Sperm NONE SEEN     Imaging No results found.  MAU Course  Procedures Lab Orders         Wet prep, genital    Meds ordered this encounter  Medications   ondansetron (ZOFRAN-ODT) disintegrating tablet 4 mg   alum & mag hydroxide-simeth (MAALOX/MYLANTA) 200-200-20 MG/5ML suspension 30 mL   lactated ringers bolus 1,000 mL   fentaNYL (SUBLIMAZE) injection 100 mcg   Imaging Orders  No imaging studies ordered today    MDM Moderate (Level 3-4)  Assessment and Plan  #Encounter for suspected rupture of membranes with rupture of membranes not found #[redacted] weeks gestation of pregnancy Neg pool, neg valsalva, neg fern, intact on SVE. Ruled out for rupture.   #Preterm contractions Patients contractions have spaced. No change in cervical exam. Main symptom is rectal pressure. Discussed baby is low, can do trendelenburg but does not appear to be in labor at present. Discussed routine labor precautions.  #Nausea #GERD Given Zofran ODT and maalox with good effect.  #FWB FHT Cat I NST: Reactive   Dispo: discharged to home in stable condition    [name], MD/MPH 11/15/22 9:31 PM  Allergies as of 11/15/2022       Reactions   Milk [milk (cow)] Nausea And Vomiting   Stomach cramping        Medication List     TAKE these medications    acetaminophen 500 MG tablet Commonly known as: TYLENOL Take 500 mg by mouth every 6 (six) hours as needed.   Blood Pressure Kit Devi 1 each by Does not apply route 3 times/day as needed-between meals & bedtime.   Concept DHA 53.5-38-1 MG Caps Take 1 tablet by mouth daily.   cyclobenzaprine  10 MG tablet Commonly known as: FLEXERIL Take 1 tablet (10 mg total) by mouth 3 (three) times daily as needed for muscle spasms.   Gojji Weight Scale Misc 1 each by Does not apply route as needed.   metroNIDAZOLE 500 MG tablet Commonly known as: FLAGYL Take 1 tablet (500 mg total) by mouth 2 (two) times daily.   valACYclovir 1000 MG tablet Commonly known as: VALTREX Take 1 tablet (1,000 mg total) by mouth 2 (two) times daily for 7 days.

## 2022-11-15 NOTE — MAU Note (Signed)
.  Heidi Martin is a 22 y.o. at [redacted]w[redacted]d here in MAU reporting: sent from office due to contractions and reports SVE in offfice 3.5 cm. Patient reports bloody show around 1230 when she lost her mucous plug.   Patient denies LOF, and endorses + FM.   Onset of complaint: today Pain score: ctxn -10/10; rectal pressure 10/10  Vitals:   11/15/22 1749 11/15/22 1750  BP: 115/74   Pulse: 100   Resp: 19   Temp: 97.8 F (36.6 C)   SpO2: 100% 99%     FHT:124 Lab orders placed from triage:

## 2022-11-15 NOTE — Progress Notes (Signed)
   PRENATAL VISIT NOTE  Subjective:  Heidi Martin is a 21 y.o. [phone] at [redacted]w[redacted]d being seen today for ongoing prenatal care.  She is currently monitored for the following issues for this low-risk pregnancy and has Sickle cell trait (HCC); Nausea/vomiting in pregnancy; Supervision of low-risk pregnancy; Alpha thalassemia silent carrier; Carrier of spinal muscular atrophy; Bipolar disorder & ADHD; Migraine headache; History of FGR; and LSIL pap on their problem list.  Patient reports  contractions, low back pain and rectal pain/pressure. Have been happening all day, also reports loss of her mucus plug with some bloody show mixed in and possible leaking of fluid .  Contractions: Irritability. Vag. Bleeding: Scant.  Movement: Present. Denies leaking of fluid.   The following portions of the patient's history were reviewed and updated as appropriate: allergies, current medications, past family history, past medical history, past social history, past surgical history and problem list.   Objective:   Vitals:   11/15/22 1631  BP: (!) 128/92  Pulse: 98  Weight: 198 lb (89.8 kg)   Fetal Status: Fetal Heart Rate (bpm): 150   Movement: Present  Presentation: Vertex  General:  Alert, oriented and cooperative. Patient is in no acute distress.  Skin: Skin is warm and dry. No rash noted.   Cardiovascular: Normal heart rate noted  Respiratory: Normal respiratory effort, no problems with respiration noted  Abdomen: Soft, gravid, appropriate for gestational age.  Pain/Pressure: Present     Pelvic: Cervical exam performed in the presence of a chaperone Dilation: 3.5 Effacement (%): 80 Station: -1  Extremities: Normal range of motion.  Edema: None  Mental Status: Normal mood and affect. Normal behavior. Normal judgment and thought content.   Assessment and Plan:  Pregnancy: G4P1021 at [redacted]w[redacted]d 1. Encounter for supervision of low-risk pregnancy in third trimester - Doing well, feeling regular and vigorous  fetal movement   2. [redacted] weeks gestation of pregnancy - Routine OB care  - Culture, beta strep (group b only) - GC/Chlamydia probe amp ([place])not at [facility]  3. Preterm contractions - Cervical change noted and pt contracting q107min or less through exam, suggested she go to MAU, she was amenable but clearly unable to drive herself given intensity of contractions and discomfort.  - EMS called for transport, met team at door and gave report and demographic sheet. Also called MAU to give report, care turned over to EMS at 1720  Preterm labor symptoms and general obstetric precautions including but not limited to vaginal bleeding, contractions, leaking of fluid and fetal movement were reviewed in detail with the patient. Please refer to After Visit Summary for other counseling recommendations.   Return in about 1 week (around 11/22/2022) for IN-PERSON, LOB.  Future Appointments  Date Time Provider Department Center  11/22/2022  2:45 PM [facility] CLINICIAN [facility]  11/22/2022  3:55 PM Bernerd Limbo, CNM [facility]   Bernerd Limbo, CNM

## 2022-11-16 ENCOUNTER — Inpatient Hospital Stay (HOSPITAL_COMMUNITY)
Admission: AD | Admit: 2022-11-16 | Discharge: 2022-11-16 | Disposition: A | Discharge status: Home / Self Care | Payer: Medicaid Other | Attending: Obstetrics and Gynecology | Admitting: Obstetrics and Gynecology

## 2022-11-16 DIAGNOSIS — K5900 Constipation, unspecified: Secondary | ICD-10-CM | POA: Diagnosis not present

## 2022-11-16 DIAGNOSIS — K219 Gastro-esophageal reflux disease without esophagitis: Secondary | ICD-10-CM | POA: Diagnosis not present

## 2022-11-16 DIAGNOSIS — O99613 Diseases of the digestive system complicating pregnancy, third trimester: Secondary | ICD-10-CM | POA: Diagnosis not present

## 2022-11-16 DIAGNOSIS — Z8616 Personal history of COVID-19: Secondary | ICD-10-CM | POA: Insufficient documentation

## 2022-11-16 DIAGNOSIS — O2243 Hemorrhoids in pregnancy, third trimester: Secondary | ICD-10-CM | POA: Insufficient documentation

## 2022-11-16 DIAGNOSIS — K649 Unspecified hemorrhoids: Secondary | ICD-10-CM

## 2022-11-16 DIAGNOSIS — O4703 False labor before 37 completed weeks of gestation, third trimester: Secondary | ICD-10-CM | POA: Diagnosis not present

## 2022-11-16 DIAGNOSIS — Z3A35 35 weeks gestation of pregnancy: Secondary | ICD-10-CM | POA: Diagnosis not present

## 2022-11-16 DIAGNOSIS — O479 False labor, unspecified: Secondary | ICD-10-CM | POA: Diagnosis not present

## 2022-11-16 DIAGNOSIS — K6289 Other specified diseases of anus and rectum: Secondary | ICD-10-CM | POA: Diagnosis present

## 2022-11-16 LAB — URINALYSIS, ROUTINE W REFLEX MICROSCOPIC
Bilirubin Urine: NEGATIVE
Glucose, UA: NEGATIVE mg/dL
Hgb urine dipstick: NEGATIVE
Ketones, ur: NEGATIVE mg/dL
Leukocytes,Ua: NEGATIVE
Nitrite: NEGATIVE
Protein, ur: NEGATIVE mg/dL
Specific Gravity, Urine: 1.009 (ref 1.005–1.030)
pH: 8 (ref 5.0–8.0)

## 2022-11-16 LAB — GC/CHLAMYDIA PROBE AMP (~~LOC~~) NOT AT ARMC
Chlamydia: NEGATIVE
Comment: NEGATIVE
Comment: NORMAL
Neisseria Gonorrhea: NEGATIVE

## 2022-11-16 MED ORDER — WITCH HAZEL-GLYCERIN EX PADS: 1.0000 | MEDICATED_PAD | CUTANEOUS | 0 refills | Status: DC | PRN

## 2022-11-16 MED ORDER — FAMOTIDINE 20 MG PO TABS: 20.0000 mg | ORAL_TABLET | Freq: Every day | ORAL | 0 refills | Status: DC

## 2022-11-16 MED ORDER — TERBUTALINE SULFATE 1 MG/ML IJ SOLN
0.2500 mg | Freq: Once | INTRAMUSCULAR | Status: AC
  Administered 2022-11-16: 0.25 mg via SUBCUTANEOUS
  Filled 2022-11-16: qty 1

## 2022-11-16 MED ORDER — LIDOCAINE VISCOUS HCL 2 % MT SOLN
15.0000 mL | Freq: Once | OROMUCOSAL | Status: AC
  Administered 2022-11-16: 15 mL via ORAL
  Filled 2022-11-16: qty 15

## 2022-11-16 MED ORDER — POLYETHYLENE GLYCOL 3350 17 GM/SCOOP PO POWD: 17.0000 g | Freq: Every day | ORAL | 0 refills | Status: DC

## 2022-11-16 MED ORDER — LIDOCAINE 4 % EX GEL: 1.0000 | Freq: Three times a day (TID) | CUTANEOUS | 1 refills | Status: DC | PRN

## 2022-11-16 MED ORDER — ALUM & MAG HYDROXIDE-SIMETH 200-200-20 MG/5ML PO SUSP
30.0000 mL | Freq: Once | ORAL | Status: AC
  Administered 2022-11-16: 30 mL via ORAL
  Filled 2022-11-16: qty 30

## 2022-11-16 MED ORDER — PRAMOXINE HCL (PERIANAL) 1 % EX FOAM: 1.0000 | Freq: Three times a day (TID) | CUTANEOUS | 0 refills | Status: DC | PRN

## 2022-11-16 NOTE — MAU Note (Addendum)
.  Heidi Martin is a 22 y.o. at [redacted]w[redacted]d here in MAU reporting: Continuing CTX and increased pressure in her "bottom." She reports her CTX's have not stopped and reports they feel like period cramps at the top of her abdomen. She reports she has also been experiencing rectal pain and reports this pain is increased when she has a contraction. She reports she does have hemorrhoids and reports her pain is sharp. She reports has not spoken to anyone about her hemorrhoid pain but has been experiencing the pain the majority of her pregnancy. Denies VB or LOF. DFM for the past two days. She reports she has not felt any movement since being discharged yesterday.   Evaluated in MAU yesterday afternoon after being sent via EMS to MAU for Labor Eval and rule out rupture. Patient was 3.580-1 and unchanged.  Reports soft and regular bowl movements.  Onset of complaint: Ongoing  Pain score:  8/10 upper abdomen and lower abdomen 10/10 rectum  FHT: 140 initial external Lab orders placed from triage:  UA

## 2022-11-16 NOTE — MAU Provider Note (Addendum)
History     CSN: [id number]  Arrival date and time: 11/16/22 1200   Event Date/Time   First Provider Initiated Contact with Patient 11/16/22 1239      Chief Complaint  Patient presents with   Contractions   HPI  Heidi Martin is a 22 y.o. [id number] at [redacted]w[redacted]d who presents for evaluation of rectal pain. Patient reports she is having pain from what she thinks is a hemorrhoid. She states she can feel it when she wipes. She denies any rectal bleeding. She states she is also still having contractions and when she has a contraction, the pain in her rectum is worse. Patient rates the pain as a 10/10 and has not tried anything for the pain. She denies any vaginal bleeding, discharge, and leaking of fluid. Denies any constipation, diarrhea or any urinary complaints. Reports normal fetal movement.   OB History     Gravida  4   Para  1   Term  1   Preterm      AB  2   Living  1      SAB  1   IAB  1   Ectopic      Multiple  0   Live Births  1           Past Medical History:  Diagnosis Date   Alpha thalassemia silent carrier    Chronic intractable headache 10/13/2014   COVID-19    Depression    Fatigue 05/31/2015   Irritable bowel syndrome (IBS)    Musculoskeletal pain 05/31/2015   PID (acute pelvic inflammatory disease)    Sickle cell trait (HCC)    Trichomonas infection    UTI (urinary tract infection)     Past Surgical History:  Procedure Laterality Date   DILATION AND EVACUATION N/A 10/27/2021   Procedure: DILATATION AND EVACUATION;  Surgeon: [place] Bing, MD;  Location: [facility] OR;  Service: Gynecology;  Laterality: N/A;    Family History  Problem Relation Age of Onset   Cancer Mother    Depression Mother    Miscarriages / Stillbirths Mother    Hypertension Mother    Hypertension Father    Cancer Maternal Grandmother    Diabetes Maternal Grandmother    Miscarriages / Stillbirths Maternal Grandmother     Social History   Tobacco Use   Smoking  status: Never   Smokeless tobacco: Never  Vaping Use   Vaping Use: Never used  Substance Use Topics   Alcohol use: No   Drug use: No    Allergies:  Allergies  Allergen Reactions   Milk [Milk (Cow)] Nausea And Vomiting    Stomach cramping    Medications Prior to Admission  Medication Sig Dispense Refill Last Dose   acetaminophen (TYLENOL) 500 MG tablet Take 500 mg by mouth every 6 (six) hours as needed.      Blood Pressure Monitoring (BLOOD PRESSURE KIT) DEVI 1 each by Does not apply route 3 times/day as needed-between meals & bedtime. 1 each 0    cyclobenzaprine (FLEXERIL) 10 MG tablet Take 1 tablet (10 mg total) by mouth 3 (three) times daily as needed for muscle spasms. 30 tablet 1    metroNIDAZOLE (FLAGYL) 500 MG tablet Take 1 tablet (500 mg total) by mouth 2 (two) times daily. 14 tablet 0    Misc. Devices (GOJJI WEIGHT SCALE) MISC 1 each by Does not apply route as needed. 1 each 0    Prenat-FeFum-FePo-FA-Omega 3 (CONCEPT DHA) 53.5-38-1 MG CAPS Take  1 tablet by mouth daily. 30 capsule 12     Review of Systems  Constitutional: Negative.  Negative for fatigue and fever.  HENT: Negative.    Respiratory: Negative.  Negative for shortness of breath.   Cardiovascular: Negative.  Negative for chest pain.  Gastrointestinal:  Positive for abdominal pain and rectal pain. Negative for constipation, diarrhea, nausea and vomiting.  Genitourinary: Negative.  Negative for dysuria.  Neurological: Negative.  Negative for dizziness and headaches.   Physical Exam   Blood pressure (!) 101/57, pulse (!) 102, temperature 98.1 F (36.7 C), temperature source Oral, resp. rate 17, height 5\' 7"  (1.702 m), weight 90.2 kg, last menstrual period 03/24/2022, SpO2 100 %, unknown if currently breastfeeding.  Patient Vitals for the past 24 hrs:  BP Temp Temp src Pulse Resp SpO2 Height Weight  11/16/22 1249 -- -- -- -- -- -- 5\' 7"  (1.702 m) 90.2 kg  11/16/22 1230 (!) 101/57 -- -- (!) 102 -- 100 % -- --   11/16/22 1227 (!) 108/56 98.1 F (36.7 C) Oral (!) 106 17 99 % -- --    Physical Exam Vitals and nursing note reviewed.  Constitutional:      General: She is not in acute distress.    Appearance: She is well-developed.  HENT:     Head: Normocephalic.  Eyes:     Pupils: Pupils are equal, round, and reactive to light.  Cardiovascular:     Rate and Rhythm: Normal rate and regular rhythm.     Heart sounds: Normal heart sounds.  Pulmonary:     Effort: Pulmonary effort is normal. No respiratory distress.     Breath sounds: Normal breath sounds.  Abdominal:     General: Bowel sounds are normal. There is no distension.     Palpations: Abdomen is soft.     Tenderness: There is no abdominal tenderness.  Genitourinary:   Skin:    General: Skin is warm and dry.  Neurological:     Mental Status: She is alert and oriented to person, place, and time.  Psychiatric:        Mood and Affect: Mood normal.        Behavior: Behavior normal.        Thought Content: Thought content normal.        Judgment: Judgment normal.     Fetal Tracing:  Baseline: 130 Variability: moderate Accels: 15x15 Decels: none  Toco: none  Dilation: 3.5 Exam by:: Cleone Slim, CNM   MAU Course  Procedures  Results for orders placed or performed during the hospital encounter of 11/15/22 (from the past 24 hour(s))  GC/Chlamydia probe amp ([place])not at [facility]     Status: None   Collection Time: 11/15/22  6:18 PM  Result Value Ref Range   Neisseria Gonorrhea Negative    Chlamydia Negative    Comment Normal Reference Ranger Chlamydia - Negative    Comment      Normal Reference Range Neisseria Gonorrhea - Negative  Wet prep, genital     Status: Abnormal   Collection Time: 11/15/22  6:18 PM   Specimen: PATH Cytology Cervicovaginal Ancillary Only  Result Value Ref Range   Yeast Wet Prep HPF POC NONE SEEN NONE SEEN   Trich, Wet Prep NONE SEEN NONE SEEN   Clue Cells Wet Prep HPF POC NONE SEEN  NONE SEEN   WBC, Wet Prep HPF POC >=10 (A) <10   Sperm NONE SEEN   Fern Test     Status:  Abnormal   Collection Time: 11/15/22  9:41 PM  Result Value Ref Range   POCT Fern Test Negative = intact amniotic membranes     MDM Labs ordered and reviewed.   Cervix unchanged from previous exams. No contractions noted on toco but patient holding soft abdomen and no contractions palpated. Offered tocolysis for comfort and patient agreeable.   Terbutaline  Patient report contractions are improved but reports constant ache at top of abdomen. Patient actively massaging area. Discussed that pain could be from baby, massage or GI but not labor. Offered GI cocktail and patient agreeable  GI cocktail  Care turned over to [facility]. Makisha Marrin CNM at 1330.  Rolm Bookbinder, CNM 11/16/22  1330: Assumed care of pt. GI cocktail just given. 1400: Pt reports resolution of upper abdominal pain, having occasional cramping. No signs of PTL. Discussed comfort measures. Stable for discharge home.  Assessment and Plan   1. Hemorrhoids, unspecified hemorrhoid type   2. [redacted] weeks gestation of pregnancy   3. Braxton Hicks contractions   4. Gastroesophageal reflux disease without esophagitis   5. Constipation, unspecified constipation type    Discharge home Follow up at [facility] as scheduled PTL precautions Rx Pepcid Rx Lidocaine Rx Tucks Rx Miralax  Allergies as of 11/16/2022       Reactions   Milk [milk (cow)] Nausea And Vomiting   Stomach cramping        Medication List     TAKE these medications    acetaminophen 500 MG tablet Commonly known as: TYLENOL Take 500 mg by mouth every 6 (six) hours as needed.   Blood Pressure Kit Devi 1 each by Does not apply route 3 times/day as needed-between meals & bedtime.   Concept DHA 53.5-38-1 MG Caps Take 1 tablet by mouth daily.   cyclobenzaprine 10 MG tablet Commonly known as: FLEXERIL Take 1 tablet (10 mg total) by mouth 3 (three) times daily as needed for  muscle spasms.   famotidine 20 MG tablet Commonly known as: PEPCID Take 1 tablet (20 mg total) by mouth at bedtime.   Gojji Weight Scale Misc 1 each by Does not apply route as needed.   Lidocaine 4 % Gel Apply 1 Application topically 3 (three) times daily as needed.   metroNIDAZOLE 500 MG tablet Commonly known as: FLAGYL Take 1 tablet (500 mg total) by mouth 2 (two) times daily.   polyethylene glycol powder 17 GM/SCOOP powder Commonly known as: GLYCOLAX/MIRALAX Take 17 g by mouth daily.   pramoxine 1 % foam Commonly known as: PROCTOFOAM Place 1 Application rectally 3 (three) times daily as needed for anal itching.   witch hazel-glycerin pad Commonly known as: TUCKS Apply 1 Application topically as needed for itching.        Donette Larry, CNM  11/16/2022 2:08 PM

## 2022-11-16 NOTE — MAU Note (Signed)
Patient reports her pain at the top of her abdomen has ceased and reports she is now only experiencing occasional "cramps" in her lower abdomen. Patient reports she would like to go home. Verbalized her next appointment is 5/22.

## 2022-11-17 LAB — GC/CHLAMYDIA PROBE AMP (~~LOC~~) NOT AT ARMC
Chlamydia: NEGATIVE
Comment: NEGATIVE
Comment: NORMAL
Neisseria Gonorrhea: NEGATIVE

## 2022-11-19 LAB — CULTURE, BETA STREP (GROUP B ONLY): Strep Gp B Culture: POSITIVE — AB

## 2022-11-20 ENCOUNTER — Encounter: Payer: Self-pay | Admitting: Certified Nurse Midwife

## 2022-11-20 DIAGNOSIS — O99820 Streptococcus B carrier state complicating pregnancy: Secondary | ICD-10-CM | POA: Insufficient documentation

## 2022-11-20 MED ORDER — HYDROXYZINE HCL 25 MG PO TABS: 25.0000 mg | ORAL_TABLET | Freq: Every day | ORAL | 1 refills | Status: DC

## 2022-11-22 ENCOUNTER — Ambulatory Visit (INDEPENDENT_AMBULATORY_CARE_PROVIDER_SITE_OTHER): Payer: Medicaid Other | Admitting: Certified Nurse Midwife

## 2022-11-22 ENCOUNTER — Other Ambulatory Visit: Payer: Self-pay

## 2022-11-22 ENCOUNTER — Ambulatory Visit (INDEPENDENT_AMBULATORY_CARE_PROVIDER_SITE_OTHER): Payer: Medicaid Other | Admitting: Clinical

## 2022-11-22 VITALS — BP 118/81 | HR 99 | Wt 200.1 lb

## 2022-11-22 DIAGNOSIS — Z3A36 36 weeks gestation of pregnancy: Secondary | ICD-10-CM

## 2022-11-22 DIAGNOSIS — O99820 Streptococcus B carrier state complicating pregnancy: Secondary | ICD-10-CM | POA: Diagnosis not present

## 2022-11-22 DIAGNOSIS — F3160 Bipolar disorder, current episode mixed, unspecified: Secondary | ICD-10-CM

## 2022-11-22 DIAGNOSIS — Z3493 Encounter for supervision of normal pregnancy, unspecified, third trimester: Secondary | ICD-10-CM

## 2022-11-22 DIAGNOSIS — F909 Attention-deficit hyperactivity disorder, unspecified type: Secondary | ICD-10-CM

## 2022-11-22 NOTE — BH Specialist Note (Addendum)
Integrated Behavioral Health via Telemedicine Visit  11/22/2022 DALEXA CLOOS [id number]  Number of Integrated Behavioral Health Clinician visits: 1- Initial Visit  Session Start time: 1448   Session End time: 1510  Total time in minutes: 22   Referring Provider: Edd Arbour, CNM Patient/Family location: Home [facility] Provider location: Center for [facility] at [facility] All persons participating in visit: Patient Heidi Martin and [facility] Kacyn Souder   Types of Service: Individual psychotherapy and Video visit  I connected with Heidi Martin and/or Heidi Martin [place]'s  almost 22yo daughter  via  Telephone or Engineer, civil (consulting)  (Video is Surveyor, mining) and verified that I am speaking with the correct person using two identifiers. Discussed confidentiality: Yes   I discussed the limitations of telemedicine and the availability of in person appointments.  Discussed there is a possibility of technology failure and discussed alternative modes of communication if that failure occurs.  I discussed that engaging in this telemedicine visit, they consent to the provision of behavioral healthcare and the services will be billed under their insurance.  Patient and/or legal guardian expressed understanding and consented to Telemedicine visit: Yes   Presenting Concerns: Patient and/or family reports the following symptoms/concerns: Worries about having postpartum depression; difficulty staying asleep (hydroxyzine is helping to sleep about 2 hours); poor appetite and low iron. Pt open to referral to psychiatry.  Duration of problem: Perinatal increase; Severity of problem: moderate  Patient and/or Family's Strengths/Protective Factors: Social connections, Concrete supports in place (healthy food, safe environments, etc.), and Sense of purpose  Goals Addressed: Patient will:  Reduce symptoms of: anxiety, depression, insomnia, and stress    Increase knowledge and/or ability of: healthy habits and stress reduction   Demonstrate ability to: Increase healthy adjustment to current life circumstances and Increase motivation to adhere to plan of care  Progress towards Goals: Ongoing  Interventions: Interventions utilized:  Solution-Focused Strategies, Psychoeducation and/or Health Education, and Link to [employer] Standardized Assessments completed: PHQ9/GAD7 Patient and/or Family Response: Patient agrees with treatment plan.   Assessment: Patient currently experiencing Bipolar affective disorder; ADHD (both as previously diagnosed).   Patient may benefit from psychoeducation and brief therapeutic interventions regarding coping with symptoms of anxiety, depression, life stress, insomnia .  Plan: Follow up with behavioral health clinician on : Two weeks Behavioral recommendations:  -Continue taking prenatal vitamin and hydroxyzine as prescribed -Establish care with psychiatry next week via walk-in hours at [facility] (information on After Visit Summary) -Read through After Visit Summary; use as needed to prepare for postpartum time Referral(s): Integrated Art gallery manager (In Clinic) and [facility] (LME/Outside Clinic)  I discussed the assessment and treatment plan with the patient and/or parent/guardian. They were provided an opportunity to ask questions and all were answered. They agreed with the plan and demonstrated an understanding of the instructions.   They were advised to call back or seek an in-person evaluation if the symptoms worsen or if the condition fails to improve as anticipated.  Rae Lips, LCSW     11/22/2022    5:14 PM 07/12/2022    5:09 PM 06/12/2022    9:24 AM 10/05/2021   11:20 AM 01/05/2021    4:35 PM  Depression screen PHQ 2/9  Decreased Interest 3 3 3  0 1  Down, Depressed, Hopeless 3 1 1  0 0  PHQ - 2 Score 6 4 4  0 1  Altered sleeping 3 1 2 3  0  Tired,  decreased energy 3 3 3 3  0  Change in appetite 2 2 3  0 0  Feeling bad or failure about yourself  0 0 0 0 0  Trouble concentrating 0 2 1 0 0  Moving slowly or fidgety/restless 0 0 0 0 0  Suicidal thoughts 0 0 0 0 0  PHQ-9 Score 14 12 13 6 1       11/22/2022    5:14 PM 07/12/2022    5:09 PM 06/12/2022    9:24 AM 10/05/2021   11:23 AM  GAD 7 : Generalized Anxiety Score  Nervous, Anxious, on Edge 0 0 2 1  Control/stop worrying 0 0 0 0  Worry too much - different things 0 0 0 0  Trouble relaxing 3 1 3 3   Restless 0 0 1 0  Easily annoyed or irritable 3 0 3 3  Afraid - awful might happen 0 0 0 0  Total GAD 7 Score 6 1 9  7

## 2022-11-22 NOTE — Patient Instructions (Addendum)
[facility] [address] [place], [place] [address] [phone] (main office) [phone] (Kaede Clendenen's office)  [facility]  [address], [place], [place] [address] [phone] or [phone] WALK-IN URGENT CARE 24/7 FOR ANYONE [address], , [place]  [phone] Fax: [phone] [email or web address] *Interpreters available *Accepts all insurance and uninsured for Urgent Care needs *Accepts Medicaid and uninsured for outpatient treatment (below)    ONLY FOR [place] Residents  Below:   Outpatient New Patient Assessment/Therapy Walk-ins:        Monday -Thursday 8am until slots are full.        Every Friday 1pm-4pm  (first come, first served)                   New Patient Psychiatry/Medication Management        Monday-Friday 8am-11am (first come, first served)              For all walk-ins we ask that you arrive by 7:15am, because patients will be seen in the order of arrival.    [place] [facility]  (Childcare options, Early childcare development, etc.) www.[place]childdev.org   [facility] Search Engine  https://ncchildcare.ncdhhs.gov/childcaresearch     BRAINSTORMING  Develop a Plan Goals: Provide a way to start conversation about your new life with a baby Assist parents in recognizing and using resources within their reach Help pave the way before birth for an easier period of transition afterwards.  Make a list of the following information to keep in a central location: Full name of Mom and Partner: _____________________________________________ Baby's full name and Date of Birth: ___________________________________________ Home Address: ___________________________________________________________ ________________________________________________________________________ Home Phone:  ____________________________________________________________ Parents' cell numbers: _____________________________________________________ ________________________________________________________________________ Name and contact info for OB: ______________________________________________ Name and contact info for Pediatrician:________________________________________ Contact info for Lactation Consultants: ________________________________________  REST and SLEEP *You each need at least 4-5 hours of uninterrupted sleep every day. Write specific names and contact information.* How are you going to rest in the postpartum period? While partner's home? When partner returns to work? When you both return to work? Where will your baby sleep? Who is available to help during the day? Evening? Night? Who could move in for a period to help support you? What are some ideas to help you get enough sleep? __________________________________________________________________________________________________________________________________________________________________________________________________________________________________________ NUTRITIOUS FOOD AND DRINK *Plan for meals before your baby is born so you can have healthy food to eat during the immediate postpartum period.* Who will look after breakfast? Lunch? Dinner? List names and contact information. Brainstorm quick, healthy ideas for each meal. What can you do before baby is born to prepare meals for the postpartum period? How can others help you with meals? Which grocery stores provide online shopping and delivery? Which restaurants offer take-out or delivery  options? ______________________________________________________________________________________________________________________________________________________________________________________________________________________________________________________________________________________________________________________________________________________________________________________________________  CARE FOR MOM *It's important that mom is cared for and pampered in the postpartum period. Remember, the most important ways new mothers need care are: sleep, nutrition, gentle exercise, and time off.* Who can come take care of mom during this period? Make a list of people with their contact information. List some activities that make you feel cared for, rested, and energized? Who can make sure you have opportunities to do these things? Does mom have a space of her very own within your home that's just for her? Make a "Mama Cave" where she can be comfortable, rest, and renew herself daily. ______________________________________________________________________________________________________________________________________________________________________________________________________________________________________________________________________________________________________________________________________________________________________________________________________    CARE FOR AND FEEDING BABY *  Knowledgeable and encouraging people will offer the best support with regard to feeding your baby.* Educate yourself and choose the best feeding option for your baby. Make a list of people who will guide, support, and be a resource for you as your care for and feed your baby. (Friends that have breastfed or are currently breastfeeding, lactation consultants, breastfeeding support groups, etc.) Consider a postpartum doula. (These websites can give you information: dona.org & padanc.org) Seek out local  breastfeeding resources like the breastfeeding support group at [facility] or La Leche League. ______________________________________________________________________________________________________________________________________________________________________________________________________________________________________________________________________________________________________________________________________________________________________________________________________  CHORES AND ERRANDS Who can help with a thorough cleaning before baby is born? Make a list of people who will help with housekeeping and chores, like laundry, light cleaning, dishes, bathrooms, etc. Who can run some errands for you? What can you do to make sure you are stocked with basic supplies before baby is born? Who is going to do the shopping? ______________________________________________________________________________________________________________________________________________________________________________________________________________________________________________________________________________________________________________________________________________________________________________________________________     Family Adjustment *Nurture yourselves.it helps parents be more loving and allows for better bonding with their child.* What sorts of things do you and partner enjoy doing together? Which activities help you to connect and strengthen your relationship? Make a list of those things. Make a list of people whom you trust to care for your baby so you can have some time together as a couple. What types of things help partner feel connected to Mom? Make a list. What needs will partner have in order to bond with baby? Other children? Who will care for them when you go into labor and while you are in the hospital? Think about what the needs of your older children might be. Who can help you meet those  needs? In what ways are you helping them prepare for bringing baby home? List some specific strategies you have for family adjustment. _______________________________________________________________________________________________________________________________________________________________________________________________________________________________________________________________________________________________________________________________________________  SUPPORT *Someone who can empathize with experiences normalizes your problems and makes them more bearable.* Make a list of other friends, neighbors, and/or co-workers you know with infants (and small children, if applicable) with whom you can connect. Make a list of local or online support groups, mom groups, etc. in which you can be involved. ______________________________________________________________________________________________________________________________________________________________________________________________________________________________________________________________________________________________________________________________________________________________________________________________________  Childcare Plans Investigate and plan for childcare if mom is returning to work. Talk about mom's concerns about her transition back to work. Talk about partner's concerns regarding this transition.  Mental Health *Your mental health is one of the highest priorities for a pregnant or postpartum mom.* 1 in 5 women experience anxiety and/or depression from the time of conception through the first year after birth. Postpartum Mood Disorders are the #1 complication of pregnancy and childbirth and the suffering experienced by these mothers is not necessary! These illnesses are temporary and respond well to treatment, which often includes self-care, social support, talk therapy, and medication when needed. Women  experiencing anxiety and depression often say things like: "I'm supposed to be happy.why do I feel so sad?", "Why can't I snap out of it?", "I'm having thoughts that scare me." There is no need to be embarrassed if you are feeling these symptoms: Overwhelmed, anxious, angry, sad, guilty, irritable, hopeless, exhausted but can't sleep You are NOT alone. You are NOT to blame. With help, you WILL be well. Where can I find help? Medical professionals such as your OB, midwife, gynecologist, family practitioner, primary care provider, pediatrician, or mental health providers; [facility] support groups: Feelings After Birth, Breastfeeding Support Group, Baby and Me Group, and Fit 4 Two exercise classes. You have   permission to ask for help. It will confirm your feelings, validate your experiences, share/learn coping strategies, and gain support and encouragement as you heal. You are important! BRAINSTORM Make a list of local resources, including resources for mom and for partner. Identify support groups. Identify people to call late at night - include names and contact info. Talk with partner about perinatal mood and anxiety disorders. Talk with your OB, midwife, and doula about baby blues and about perinatal mood and anxiety disorders. Talk with your pediatrician about perinatal mood and anxiety disorders.   Support & Sanity Savers   What do you really need?  Basics In preparing for a new baby, many expectant parents spend hours shopping for baby clothes, decorating the nursery, and deciding which car seat to buy. Yet most don't think much about what the reality of parenting a newborn will be like, and what they need to make it through that. So, here is the advice of experienced parents. We know you'll read this, and think "they're exaggerating, I don't really need that." Just trust us on these, OK? Plan for all of this, and if it turns out you don't need it, come back and teach us how you did  it!  Must-Haves (Once baby's survival needs are met, make sure you attend to your own survival needs!) Sleep An average newborn sleeps 16-18 hours per day, over 6-7 sleep periods, rarely more than three hours at a time. It is normal and healthy for a newborn to wake throughout the night... but really hard on parents!! Naps. Prioritize sleep above any responsibilities like: cleaning house, visiting friends, running errands, etc.  Sleep whenever baby sleeps. If you can't nap, at least have restful times when baby eats. The more rest you get, the more patient you will be, the more emotionally stable, and better at solving problems.  Food You may not have realized it would be difficult to eat when you have a newborn. Yet, when we talk to countless new parents, they say things like "it may be 2:00 pm when I realize I haven't had breakfast yet." Or "every time we sit down to dinner, baby needs to eat, and my food gets cold, so I don't bother to eat it." Finger food. Before your baby is born, stock up with one months' worth of food that: 1) you can eat with one hand while holding a baby, 2) doesn't need to be prepped, 3) is good hot or cold, 4) doesn't spoil when left out for a few hours, and 5) you like to eat. Think about: nuts, dried fruit, Clif bars, pretzels, jerky, gogurt, baby carrots, apples, bananas, crackers, cheez-n-crackers, string cheese, hot pockets or frozen burritos to microwave, garden burgers and breakfast pastries to put in the toaster, yogurt drinks, etc. Restaurant Menus. Make lists of your favorite restaurants & menu items. When family/friends want to help, you can give specific information without much thought. They can either bring you the food or send gift cards for just the right meals. Freezer Meals.  Take some time to make a few meals to put in the freezer ahead of time.  Easy to freeze meals can be anything such as soup, lasagna, chicken pie, or spaghetti sauce. Set up a Meal  Schedule.  Ask friends and family to sign up to bring you meals during the first few weeks of being home. (It can be passed around at baby showers!) You have no idea how helpful this will be until you are in   the throes of parenting.  www.takethemameal.com is a great website to check out. Emotional Support Know who to call when you're stressed out. Parenting a newborn is very challenging work. There are times when it totally overwhelms your normal coping abilities. EVERY NEW PARENT NEEDS TO HAVE A PLAN FOR WHO TO CALL WHEN THEY JUST CAN'T COPE ANY MORE. (And it has to be someone other than the baby's other parent!) Before your baby is born, come up with at least one person you can call for support - write their phone number down and post it on the refrigerator. Anxiety & Sadness. Baby blues are normal after pregnancy; however, there are more severe types of anxiety & sadness which can occur and should not be ignored.  They are always treatable, but you have to take the first step by reaching out for help. [facility] offers a "Mom Talk" group which meets every Tuesday from 10 am - 11 am.  This group is for new moms who need support and connection after their babies are born.  Call [phone].  Really, Really Helpful (Plan for them! Make sure these happen often!!) Physical Support with Taking Care of Yourselves Asking friends and family. Before your baby is born, set up a schedule of people who can come and visit and help out (or ask a friend to schedule for you). Any time someone says "let me know what I can do to help," sign them up for a day. When they get there, their job is not to take care of the baby (that's your job and your joy). Their job is to take care of you!  Postpartum doulas. If you don't have anyone you can call on for support, look into postpartum doulas:  professionals at helping parents with caring for baby, caring for themselves, getting breastfeeding started, and helping with  household tasks. www.padanc.org is a helpful website for learning about doulas in our area. Peer Support / Parent Groups Why: One of the greatest ideas for new parents is to be around other new parents. Parent groups give you a chance to share and listen to others who are going through the same season of life, get a sense of what is normal infant development by watching several babies learn and grow, share your stories of triumph and struggles with empathetic ears, and forgive your own mistakes when you realize all parents are learning by trial and error. Where to find: There are many places you can meet other new parents throughout our community.  [facility] offers the following classes for new moms and their little ones:  Baby and Me (Birth to Crawling) and Breastfeeding Support Group. Go to www.conehealthybaby.com or call [phone] for more information. Time for your Relationship It's easy to get so caught up in meeting baby's immediate needs that it's hard to find time to connect with your partner, and meet the needs of your relationship. It's also easy to forget what "quality time with your partner" actually looks like. If you take your baby on a date, you'd be amazed how much of your couple time is spent feeding the baby, diapering the baby, admiring the baby, and talking about the baby. Dating: Try to take time for just the two of you. Babysitter tip: Sometimes when moms are breastfeeding a newborn, they find it hard to figure out how to schedule outings around baby's unpredictable feeding schedules. Have the babysitter come for a three hour period. When she comes over, if baby has just eaten,   you can leave right away, and come back in two hours. If baby hasn't fed recently, you start the date at home. Once baby gets hungry and gets a good feeding in, you can head out for the rest of your date time. Date Nights at Home: If you can't get out, at least set aside one evening a week to prioritize  your relationship: whenever baby dozes off or doesn't have any immediate needs, spend a little time focusing on each other. Potential conflicts: The main relationship conflicts that come up for new parents are: issues related to sexuality, financial stresses, a feeling of an unfair division of household tasks, and conflicts in parenting styles. The more you can work on these issues before baby arrives, the better!  Fun and Frills (Don't forget these. and don't feel guilty for indulging in them!) Everyone has something in life that is a fun little treat that they do just for themselves. It may be: reading the morning paper, or going for a daily jog, or having coffee with a friend once a week, or going to a movie on Friday nights, or fine chocolates, or bubble baths, or curling up with a good book. Unless you do fun things for yourself every now and then, it's hard to have the energy for fun with your baby. Whatever your "special" treats are, make sure you find a way to continue to indulge in them after your baby is born. These special moments can recharge you, and allow you to return to baby with a new joy   PERINATAL MOOD DISORDERS: MATERNAL MENTAL HEALTH FROM CONCEPTION THROUGH THE POSTPARTUM PERIOD   _________________________________________Emergency and Crisis Resources If you are an imminent risk to self or others, are experiencing intense personal distress, and/or have noticed significant changes in activities of daily living, call:  911 [facility]: [phone]  [address], [place], [place], [address] Mobile Crisis: 877-626-1772 National Suicide Hotline: 988 Or visit the following crisis centers: Local Emergency Departments [facility]: [address], [place]  [phone]. Hours: 8:30AM-5PM. Insurance Accepted: Medicaid, Medicare, and Uninsured.  [facility]:  [address], [place]  Mon-Friday 8am-3pm, [phone]                                                                                   ___________ Non-Crisis Resources To identify specific providers that are covered by your insurance, contact your insurance company or local agencies:  [facility]: 1-800-256-2452 [facility]: 888-581-9988 [facility] [facility]: 1-800-939-5911 Postpartum Support International- Warm-line: 1-800-944-4773                                                      __Outpatient Therapy and Medication Management   Providers:  [facility]: [phone] Hours: 9AM-5PM  Insurance Accepted: AARP, Aetna, BCBS, Cigna, [employer], Humana, Medicare  [facility] [phone] Hours: 8AM-5:30PM  nsurance Accepted: All insurances EXCEPT AARP, Aetna, [employer], and Humana [facility]: [phone] Hours: 8AM-8PM   Insurance Accepted: Aetna, BCBS, Cigna, [employer], Medicaid, Medicare, Uninsured [facility]: [phone] [facility]: [phone] Hours: 8:30AM-7PM Insurance Accepted: Aetna, BCBS, Medicaid, Medicare, Tricare, United Healthcare [facility]:  [phone]   Hours:9AM-5PM Insurance Accepted:  Aetna, BCBS, [facility], Medicaid, United Health Care  [facility]: [phone] Hours: 9AM-5:30PM Insurance Accepted: AARP, Aetna, BCBS, Cigna, and Medicaid, Medicare, United Health Care [facility]:  [phone] Hours: 9am-5pm Insurance Accepted: BCBS; they do not accept Medicaid/Medicare [facility]: [phone] Hours: 9am-9pm Insurance Accepted: All major insurance including Medicaid and Medicare [facility]: [phone] Hours: 9AM- 5PM Insurance Accepted: All insurances EXCEPT Medicaid and Medicare. [facility]: [phone]   ____________                                                                      Parenting Support Groups [facility]: [phone] [facility]:  [phone] Family Support Network: (support for children in the NICU and/or with special needs), [phone]   ___________                                                                 Mental Health Support Groups Mental Health Association: [phone]    _____________                                                                                  Online Resources Postpartum Support International: http://www.postpartum.net/  800-944-4PPD 2Moms Supporting Moms:  www.momssupportingmoms.net     

## 2022-11-23 ENCOUNTER — Encounter (HOSPITAL_COMMUNITY): Payer: Self-pay | Admitting: Obstetrics & Gynecology

## 2022-11-23 ENCOUNTER — Inpatient Hospital Stay (HOSPITAL_COMMUNITY)
Admission: AD | Admit: 2022-11-23 | Discharge: 2022-11-23 | Disposition: A | Discharge status: Home / Self Care | Payer: Medicaid Other | Attending: Obstetrics & Gynecology | Admitting: Obstetrics & Gynecology

## 2022-11-23 DIAGNOSIS — R110 Nausea: Secondary | ICD-10-CM | POA: Diagnosis not present

## 2022-11-23 DIAGNOSIS — Z3A36 36 weeks gestation of pregnancy: Secondary | ICD-10-CM | POA: Diagnosis not present

## 2022-11-23 DIAGNOSIS — K5909 Other constipation: Secondary | ICD-10-CM

## 2022-11-23 DIAGNOSIS — K589 Irritable bowel syndrome without diarrhea: Secondary | ICD-10-CM | POA: Insufficient documentation

## 2022-11-23 DIAGNOSIS — O479 False labor, unspecified: Secondary | ICD-10-CM

## 2022-11-23 DIAGNOSIS — O99013 Anemia complicating pregnancy, third trimester: Secondary | ICD-10-CM | POA: Diagnosis not present

## 2022-11-23 DIAGNOSIS — Z3493 Encounter for supervision of normal pregnancy, unspecified, third trimester: Secondary | ICD-10-CM

## 2022-11-23 DIAGNOSIS — O99613 Diseases of the digestive system complicating pregnancy, third trimester: Secondary | ICD-10-CM | POA: Diagnosis present

## 2022-11-23 DIAGNOSIS — N858 Other specified noninflammatory disorders of uterus: Secondary | ICD-10-CM

## 2022-11-23 DIAGNOSIS — O99820 Streptococcus B carrier state complicating pregnancy: Secondary | ICD-10-CM

## 2022-11-23 MED ORDER — ONDANSETRON 4 MG PO TBDP
8.0000 mg | ORAL_TABLET | Freq: Once | ORAL | Status: AC
  Administered 2022-11-23: 8 mg via ORAL
  Filled 2022-11-23: qty 2

## 2022-11-23 MED ORDER — FLEET ENEMA 7-19 GM/118ML RE ENEM
1.0000 | ENEMA | Freq: Once | RECTAL | Status: AC
  Administered 2022-11-23: 1 via RECTAL

## 2022-11-23 NOTE — MAU Note (Signed)
.  Heidi Martin is a 22 y.o. at [redacted]w[redacted]d here in MAU reporting: c/o abd pain and feeling like she needs to have a BM but can't.  Reports some small leaking. Good fetal movement  reported.  LMP:  Onset of complaint: 2 hrs Pain score: 10 There were no vitals filed for this visit.   FHT:145 Lab orders placed from triage:   Labor eval

## 2022-11-23 NOTE — BH Specialist Note (Signed)
Pt did not arrive to video visit and did not answer the phone; Left HIPPA-compliant message to call back Asher Muir from [employer] for [employer] at [facility] at  [phone] [facility] office).   Pt is only 4 days postpartum; will call and reschedule appointment for a future date.

## 2022-11-23 NOTE — MAU Provider Note (Signed)
None     Chief Complaint:  Contractions   Heidi Martin is  22 y.o. [phone] at [redacted]w[redacted]d presents complaining of some lower abdominal pain, feeling like she needs to have a BM and can't   Obstetrical/Gynecological History: OB History     Gravida  4   Para  1   Term  1   Preterm      AB  2   Living  1      SAB  1   IAB  1   Ectopic      Multiple  0   Live Births  1          Past Medical History: Past Medical History:  Diagnosis Date   Alpha thalassemia silent carrier    Chronic intractable headache 10/13/2014   COVID-19    Depression    Fatigue 05/31/2015   Irritable bowel syndrome (IBS)    Musculoskeletal pain 05/31/2015   PID (acute pelvic inflammatory disease)    Sickle cell trait (HCC)    Trichomonas infection    UTI (urinary tract infection)     Past Surgical History: Past Surgical History:  Procedure Laterality Date   DILATION AND EVACUATION N/A 10/27/2021   Procedure: DILATATION AND EVACUATION;  Surgeon: Ozawkie Bing, MD;  Location: [facility];  Service: Gynecology;  Laterality: N/A;    Family History: Family History  Problem Relation Age of Onset   Cancer Mother    Depression Mother    Miscarriages / Stillbirths Mother    Hypertension Mother    Hypertension Father    Cancer Maternal Grandmother    Diabetes Maternal Grandmother    Miscarriages / Stillbirths Maternal Grandmother     Social History: Social History   Tobacco Use   Smoking status: Never   Smokeless tobacco: Never  Vaping Use   Vaping Use: Never used  Substance Use Topics   Alcohol use: No   Drug use: No    Allergies:  Allergies  Allergen Reactions   Milk [Milk (Cow)] Nausea And Vomiting    Stomach cramping    Meds:  Medications Prior to Admission  Medication Sig Dispense Refill Last Dose   cyclobenzaprine (FLEXERIL) 10 MG tablet Take 1 tablet (10 mg total) by mouth 3 (three) times daily as needed for muscle spasms. 30 tablet 1 Past Week   famotidine  (PEPCID) 20 MG tablet Take 1 tablet (20 mg total) by mouth at bedtime. 30 tablet 0 Past Week   Prenat-FeFum-FePo-FA-Omega 3 (CONCEPT DHA) 53.5-38-1 MG CAPS Take 1 tablet by mouth daily. 30 capsule 12 Past Month   acetaminophen (TYLENOL) 500 MG tablet Take 500 mg by mouth every 6 (six) hours as needed.      Blood Pressure Monitoring (BLOOD PRESSURE KIT) DEVI 1 each by Does not apply route 3 times/day as needed-between meals & bedtime. 1 each 0    hydrOXYzine (ATARAX) 25 MG tablet Take 1 tablet (25 mg total) by mouth at bedtime. Can increase to every 8 hours if it does not cause sleepiness. 30 tablet 1    Lidocaine 4 % GEL Apply 1 Application topically 3 (three) times daily as needed. 10 g 1    Misc. Devices (GOJJI WEIGHT SCALE) MISC 1 each by Does not apply route as needed. 1 each 0    polyethylene glycol powder (GLYCOLAX/MIRALAX) 17 GM/SCOOP powder Take 17 g by mouth daily. 255 g 0    pramoxine (PROCTOFOAM) 1 % foam Place 1 Application rectally 3 (three) times daily  as needed for anal itching. (Patient not taking: Reported on 11/22/2022) 15 g 0    witch hazel-glycerin (TUCKS) pad Apply 1 Application topically as needed for itching. (Patient not taking: Reported on 11/22/2022) 40 each 0     Review of Systems   Constitutional: Negative for fever and chills Eyes: Negative for visual disturbances Respiratory: Negative for shortness of breath, dyspnea Cardiovascular: Negative for chest pain or palpitations  Gastrointestinal: Negative for vomiting, diarrhea and constipation Genitourinary: Negative for dysuria and urgency Musculoskeletal: Negative for back pain, joint pain, myalgias.  Normal ROM  Neurological: Negative for dizziness and headaches    Physical Exam  Blood pressure 120/76, pulse 92, last menstrual period 03/24/2022, unknown if currently breastfeeding. GENERAL: Well-developed, well-nourished female in no acute distress.  LUNGS: Normal respiratory effort HEART: Regular rate and  rhythm. ABDOMEN: Soft, nontender, nondistended, gravid.  EXTREMITIES: Nontender, no edema, 2+ distal pulses. DTR's 2+ CERVICAL EXAM: Dilatation 4.5cm   Effacement 80%   Station -2, posterior. No change in cx while in MAU   Presentation: cephalic FHT:  Baseline rate 130 bpm   Variability moderate  Accelerations present   Decelerations none Contractions: UI  RN reports feeling a large amount of hard stool w/vaginal exam  Labs: No results found for this or any previous visit (from the past 24 hour(s)). Imaging Studies:  No results found.  Assessment: Heidi Martin is  22 y.o. [phone] at [redacted]w[redacted]d presents with constipation  Fleets enema produced good results, but still feels crampy. Also nauseated.  Plan: Zofran ODT>  Offered continued OBS vs DC, pt and mom choose to go home. Labor precautions discussed  Jacklyn Shell 5/23/20247:42 PM

## 2022-11-25 ENCOUNTER — Inpatient Hospital Stay (HOSPITAL_COMMUNITY)
Admission: AD | Admit: 2022-11-25 | Discharge: 2022-11-25 | Disposition: A | Discharge status: Home / Self Care | Payer: Medicaid Other | Attending: Obstetrics & Gynecology | Admitting: Obstetrics & Gynecology

## 2022-11-25 ENCOUNTER — Encounter (HOSPITAL_COMMUNITY): Payer: Self-pay | Admitting: Obstetrics & Gynecology

## 2022-11-25 DIAGNOSIS — R252 Cramp and spasm: Secondary | ICD-10-CM | POA: Diagnosis not present

## 2022-11-25 DIAGNOSIS — Z3A36 36 weeks gestation of pregnancy: Secondary | ICD-10-CM | POA: Insufficient documentation

## 2022-11-25 DIAGNOSIS — O26899 Other specified pregnancy related conditions, unspecified trimester: Secondary | ICD-10-CM

## 2022-11-25 DIAGNOSIS — Z3689 Encounter for other specified antenatal screening: Secondary | ICD-10-CM

## 2022-11-25 DIAGNOSIS — O4700 False labor before 37 completed weeks of gestation, unspecified trimester: Secondary | ICD-10-CM

## 2022-11-25 DIAGNOSIS — O99013 Anemia complicating pregnancy, third trimester: Secondary | ICD-10-CM | POA: Diagnosis not present

## 2022-11-25 DIAGNOSIS — R519 Headache, unspecified: Secondary | ICD-10-CM | POA: Insufficient documentation

## 2022-11-25 DIAGNOSIS — O26893 Other specified pregnancy related conditions, third trimester: Secondary | ICD-10-CM | POA: Diagnosis not present

## 2022-11-25 MED ORDER — ONDANSETRON HCL 4 MG/2ML IJ SOLN
4.0000 mg | Freq: Once | INTRAMUSCULAR | Status: AC
  Administered 2022-11-25: 4 mg via INTRAVENOUS
  Filled 2022-11-25: qty 2

## 2022-11-25 MED ORDER — LACTATED RINGERS IV BOLUS
1000.0000 mL | Freq: Once | INTRAVENOUS | Status: AC
  Administered 2022-11-25: 1000 mL via INTRAVENOUS

## 2022-11-25 MED ORDER — ACETAMINOPHEN-CAFFEINE 500-65 MG PO TABS
2.0000 | ORAL_TABLET | Freq: Once | ORAL | Status: AC
  Administered 2022-11-25: 2 via ORAL
  Filled 2022-11-25: qty 2

## 2022-11-25 MED ORDER — CVS PRENATAL GUMMY 0.4-113.5 MG PO CHEW: 1.0000 | CHEWABLE_TABLET | Freq: Every day | ORAL | 3 refills | Status: DC

## 2022-11-25 MED ORDER — CYCLOBENZAPRINE HCL 10 MG PO TABS: 10.0000 mg | ORAL_TABLET | Freq: Three times a day (TID) | ORAL | 1 refills | Status: DC | PRN

## 2022-11-25 MED ORDER — CYCLOBENZAPRINE HCL 5 MG PO TABS
10.0000 mg | ORAL_TABLET | Freq: Once | ORAL | Status: AC
  Administered 2022-11-25: 10 mg via ORAL
  Filled 2022-11-25: qty 2

## 2022-11-25 NOTE — MAU Provider Note (Signed)
Chief Complaint:  Contractions, Decreased Fetal Movement, and Nausea   HPI   Event Date/Time   First Provider Initiated Contact with Patient 11/25/22 1257     Heidi Martin is a 22 y.o. [phone] at [redacted]w[redacted]d who presents to maternity admissions reporting headache with nausea and contractions q48min. Thinks she lost her mucus plug but denies vaginal bleeding or loss of fluid. No other physical complaints. Has not taken anything for the headache or nausea.  Pregnancy Course: Receives CNM care at [facility]. Prenatal records reviewed.  Past Medical History:  Diagnosis Date   Alpha thalassemia silent carrier    Chronic intractable headache 10/13/2014   COVID-19    Depression    Fatigue 05/31/2015   Irritable bowel syndrome (IBS)    Musculoskeletal pain 05/31/2015   PID (acute pelvic inflammatory disease)    Sickle cell trait (HCC)    Trichomonas infection    UTI (urinary tract infection)    OB History  Gravida Para Term Preterm AB Living  4 1 1   2 1   SAB IAB Ectopic Multiple Live Births  1 1   0 1    # Outcome Date GA Lbr Len/2nd Weight Sex Delivery Anes PTL Lv  4 Current           3 SAB 10/2021          2 Term 11/30/20 [redacted]w[redacted]d 02:51 / 00:35 5 lb 11.9 oz (2.605 kg) F Vag-Spont EPI  LIV  1 IAB 2021           Past Surgical History:  Procedure Laterality Date   DILATION AND EVACUATION N/A 10/27/2021   Procedure: DILATATION AND EVACUATION;  Surgeon: [place] Bing, MD;  Location: [facility] OR;  Service: Gynecology;  Laterality: N/A;   Family History  Problem Relation Age of Onset   Cancer Mother    Depression Mother    Miscarriages / Stillbirths Mother    Hypertension Mother    Hypertension Father    Cancer Maternal Grandmother    Diabetes Maternal Grandmother    Miscarriages / Stillbirths Maternal Grandmother    Social History   Tobacco Use   Smoking status: Never   Smokeless tobacco: Never  Vaping Use   Vaping Use: Never used  Substance Use Topics   Alcohol use: No   Drug  use: No   Allergies  Allergen Reactions   Milk [Milk (Cow)] Nausea And Vomiting    Stomach cramping   No medications prior to admission.   I have reviewed patient's Past Medical Hx, Surgical Hx, Family Hx, Social Hx, medications and allergies.   ROS  Pertinent items noted in HPI and remainder of comprehensive ROS otherwise negative.   PHYSICAL EXAM  Patient Vitals for the past 24 hrs:  BP Temp Temp src Pulse Resp SpO2  11/25/22 1441 110/68 -- -- 76 -- --  11/25/22 1154 114/75 -- -- (!) 103 -- --  11/25/22 1148 110/73 98.4 F (36.9 C) Oral (!) 104 18 99 %   Constitutional: Well-developed, well-nourished female in no acute distress.  Cardiovascular: normal rate & rhythm, warm and well-perfused Respiratory: normal effort, no problems with respiration noted GI: Abd soft, non-tender, non-distended MS: Extremities nontender, no edema, normal ROM Neurologic: Alert and oriented x 4.  GU: no CVA tenderness Pelvic: NEFG, physiologic discharge, no blood  Dilation: 4.5 Effacement (%): 50 Cervical Position: Posterior Station: -3 Presentation: Vertex Exam by:: Georgina Snell, RN  Fetal Tracing: reactive Baseline: 135 Variability: moderate Accelerations: 15x15 Decelerations: none Toco:  mostly UI, few mild contractions   Labs: No results found for this or any previous visit (from the past 24 hour(s)).  Imaging:  No results found.  MDM & MAU COURSE  MDM: Moderate  MAU Course: Orders Placed This Encounter  Procedures   Discharge patient   Meds ordered this encounter  Medications   lactated ringers bolus 1,000 mL   ondansetron (ZOFRAN) injection 4 mg   acetaminophen-caffeine (EXCEDRIN TENSION HEADACHE) 500-65 MG per tablet 2 tablet   cyclobenzaprine (FLEXERIL) tablet 10 mg   cyclobenzaprine (FLEXERIL) 10 MG tablet    Sig: Take 1 tablet (10 mg total) by mouth every 8 (eight) hours as needed for muscle spasms.    Dispense:  30 tablet    Refill:  1   Prenatal  Vit-Min-FA-Fish Oil ([facility] PRENATAL GUMMY) 0.4-113.5 MG CHEW    Sig: Chew 1 each by mouth daily after breakfast.    Dispense:  60 tablet    Refill:  3    Ok to substitute [facility] equivalent   LR bolus + zofran and excedrin given for headache/nausea which relieved pain well but then pt complained of leg cramps. Flexeril given with good relief. Pt requested new prenatal gummy vs pill or chewable, sent to pharmacy. Cervix unchanged from previous visit, stable for discharge home.  ASSESSMENT   1. Pregnancy headache in third trimester   2. Pregnancy related nausea, antepartum   3. Leg cramps in pregnancy   4. Preterm contractions   5. [redacted] weeks gestation of pregnancy   6. NST (non-stress test) reactive    PLAN  Discharge home in stable condition with preterm labor precautions     Follow-up Information     [facility] Follow up on 12/01/2022.   Specialty: Obstetrics and Gynecology Why: as scheduled for ongoing prenatal care Contact information: [address] [place] [address] [phone]                Allergies as of 11/25/2022       Reactions   Milk [milk (cow)] Nausea And Vomiting   Stomach cramping        Medication List     STOP taking these medications    Concept DHA 53.5-38-1 MG Caps   pramoxine 1 % foam Commonly known as: PROCTOFOAM   witch hazel-glycerin pad Commonly known as: TUCKS       TAKE these medications    acetaminophen 500 MG tablet Commonly known as: TYLENOL Take 500 mg by mouth every 6 (six) hours as needed.   Blood Pressure Kit Devi 1 each by Does not apply route 3 times/day as needed-between meals & bedtime.   [facility] Prenatal Gummy 0.4-113.5 MG Chew Chew 1 each by mouth daily after breakfast.   cyclobenzaprine 10 MG tablet Commonly known as: FLEXERIL Take 1 tablet (10 mg total) by mouth 3 (three) times daily as needed for muscle spasms. What changed:  Another medication with the same name was added. Make sure you understand how and when to take each.   cyclobenzaprine 10 MG tablet Commonly known as: FLEXERIL Take 1 tablet (10 mg total) by mouth every 8 (eight) hours as needed for muscle spasms. What changed: You were already taking a medication with the same name, and this prescription was added. Make sure you understand how and when to take each.   famotidine 20 MG tablet Commonly known as: PEPCID Take 1 tablet (20 mg total) by mouth at bedtime.  Gojji Weight Scale Misc 1 each by Does not apply route as needed.   hydrOXYzine 25 MG tablet Commonly known as: ATARAX Take 1 tablet (25 mg total) by mouth at bedtime. Can increase to every 8 hours if it does not cause sleepiness.   Lidocaine 4 % Gel Apply 1 Application topically 3 (three) times daily as needed.   polyethylene glycol powder 17 GM/SCOOP powder Commonly known as: GLYCOLAX/MIRALAX Take 17 g by mouth daily.       Edd Arbour, CNM, MSN, IBCLC Certified Nurse Midwife, [facility]

## 2022-11-25 NOTE — MAU Note (Signed)
.  Heidi Martin is a 22 y.o. at [redacted]w[redacted]d here in MAU reporting:   Contractions every: paitent is unsure Onset of ctx: Today Pain score: Pain Assessment Pain Assessment: 0-10 Pain Score: 10-Worst pain ever Pain Location: Abdomen Pain Orientation: Lower Pain Descriptors / Indicators: Contraction Pain Frequency: Intermittent Pain Onset: Sudden Multiple Pain Sites: Yes  ROM: Membranes Sac Identifier: Sac 1 Membrane Status: Intact Color: Clear, Yellow Odor: Normal Amount: Small  Vaginal Bleeding: Vaginal Bleeding Vag. Bleeding: None  Last SVE: 2 days ago 4.5  Epidural: Planning  Fetal Movement: decreased FM  FHT: Fetal Heart Rate Mode: Doppler Baseline Rate (A): 130 bpm Multiple birth?: No  There were no vitals filed for this visit.     OB Office: Faculty GBS: Positive HSV: Denies hx of HSV Lab orders placed from triage: N/A

## 2022-11-26 NOTE — Progress Notes (Signed)
   PRENATAL VISIT NOTE  Subjective:  Heidi Martin is a 22 y.o. [phone] at [redacted]w[redacted]d being seen today for ongoing prenatal care.  She is currently monitored for the following issues for this low-risk pregnancy and has Sickle cell trait (HCC); Nausea/vomiting in pregnancy; Supervision of low-risk pregnancy; Alpha thalassemia silent carrier; Carrier of spinal muscular atrophy; Bipolar disorder & ADHD; Migraine headache; History of FGR; LSIL pap; and GBS (group B Streptococcus carrier), +RV culture, currently pregnant on their problem list.  Patient reports no complaints.  Contractions: Not present. Vag. Bleeding: None.  Movement: Present. Denies leaking of fluid.   The following portions of the patient's history were reviewed and updated as appropriate: allergies, current medications, past family history, past medical history, past social history, past surgical history and problem list.   Objective:   Vitals:   11/22/22 1643  BP: 118/81  Pulse: 99  Weight: 200 lb 1.6 oz (90.8 kg)    Fetal Status: Fetal Heart Rate (bpm): 120   Movement: Present     General:  Alert, oriented and cooperative. Patient is in no acute distress.  Skin: Skin is warm and dry. No rash noted.   Cardiovascular: Normal heart rate noted  Respiratory: Normal respiratory effort, no problems with respiration noted  Abdomen: Soft, gravid, appropriate for gestational age.  Pain/Pressure: Absent     Pelvic: Cervical exam deferred        Extremities: Normal range of motion.  Edema: None  Mental Status: Normal mood and affect. Normal behavior. Normal judgment and thought content.   Assessment and Plan:  Pregnancy: G4P1021 at [redacted]w[redacted]d 1. Encounter for supervision of low-risk pregnancy in third trimester - Doing well today, no contractions, just ready to have her baby. Feeling plenty of fetal movement  2. [redacted] weeks gestation of pregnancy - Routine OB care   3. GBS (group B Streptococcus carrier), +RV culture, currently  pregnant - Will treat in labor  Preterm labor symptoms and general obstetric precautions including but not limited to vaginal bleeding, contractions, leaking of fluid and fetal movement were reviewed in detail with the patient. Please refer to After Visit Summary for other counseling recommendations.   Return in about 1 week (around 11/29/2022) for IN-PERSON, LOB.  Future Appointments  Date Time Provider Department Center  12/01/2022  8:15 AM Sissy Hoff [facility]  12/06/2022  3:15 PM [facility] HEALTH CLINICIAN [facility]    Bernerd Limbo, CNM

## 2022-12-01 ENCOUNTER — Other Ambulatory Visit: Payer: Self-pay

## 2022-12-01 ENCOUNTER — Ambulatory Visit (INDEPENDENT_AMBULATORY_CARE_PROVIDER_SITE_OTHER): Payer: Medicaid Other | Admitting: Certified Nurse Midwife

## 2022-12-01 VITALS — BP 120/77 | HR 100 | Wt 204.0 lb

## 2022-12-01 DIAGNOSIS — Z0289 Encounter for other administrative examinations: Secondary | ICD-10-CM

## 2022-12-01 DIAGNOSIS — R030 Elevated blood-pressure reading, without diagnosis of hypertension: Secondary | ICD-10-CM

## 2022-12-01 DIAGNOSIS — Z3403 Encounter for supervision of normal first pregnancy, third trimester: Secondary | ICD-10-CM

## 2022-12-01 DIAGNOSIS — B951 Streptococcus, group B, as the cause of diseases classified elsewhere: Secondary | ICD-10-CM

## 2022-12-01 DIAGNOSIS — Z3A37 37 weeks gestation of pregnancy: Secondary | ICD-10-CM

## 2022-12-01 NOTE — Progress Notes (Signed)
PRENATAL VISIT NOTE  Subjective:  Heidi Martin is a 22 y.o. [phone] at [redacted]w[redacted]d being seen today for ongoing prenatal care.  She is currently monitored for the following issues for this low-risk pregnancy and has Sickle cell trait (HCC); Nausea/vomiting in pregnancy; Supervision of low-risk pregnancy; Alpha thalassemia silent carrier; Carrier of spinal muscular atrophy; Bipolar disorder & ADHD; Migraine headache; History of FGR; LSIL pap; and GBS (group B Streptococcus carrier), +RV culture, currently pregnant on their problem list.  Patient reports intermittent contractions that have been on-going. She states better today, just pelvic pressure. She requests a cervical exam today.   Contractions: Irritability. Vag. Bleeding: None.  Movement: Present. Denies leaking of fluid.   The following portions of the patient's history were reviewed and updated as appropriate: allergies, current medications, past family history, past medical history, past social history, past surgical history and problem list.   Objective:   Vitals:   12/01/22 0823  BP: 120/77  Pulse: 100  Weight: 204 lb (92.5 kg)    Fetal Status: Fetal Heart Rate (bpm): 135 Fundal Height: 37 cm Movement: Present     General:  Alert, oriented and cooperative. Patient is in no acute distress.  Skin: Skin is warm and dry. No rash noted.   Cardiovascular: Normal heart rate noted  Respiratory: Normal respiratory effort, no problems with respiration noted  Abdomen: Soft, gravid, appropriate for gestational age.  Pain/Pressure: Present     Pelvic: Cervical exam performed in the presence of a chaperone Dilation: 6 Effacement (%): 90 Station: -1  Extremities: Normal range of motion.  Edema: None  Mental Status: Normal mood and affect. Normal behavior. Normal judgment and thought content.   Assessment and Plan:  Pregnancy: G4P1021 at [redacted]w[redacted]d 1. Encounter for supervision of low-risk first pregnancy in third trimester - Patient doing well.  She reports frequent and vigorous fetal movement.   2. [redacted] weeks gestation of pregnancy - Fundal height appropriate for gestational age.  - Patient previosuly 4cm on last cervical exam on 11/25/22.  - Exam today revealed:  Dilation: 6 Effacement (%): 90 Station: -1 S. Suzie Portela CNM  - Patient not painfully contracting. Only 1-2 contractions while in office today.  - Recommended that patient be seen in MAU for further eval. Of note patients 21 year old daughters b'day party is Today and patient states she doesn't want to miss it.  - Reiterated signs and symptoms of labor and the recommendation to report to MAU for further eval especially if contractions increase in frequency or intensity. Discussed history of short cervix and how a multip uterus can labor faster than that of a prima gravida.  Patient verbalized understanding and that she would go.  - MAU providers and Charge nurse notified.   3. Positive GBS test - Plan to treat in labor.   4. Elevated BP without diagnosis of hypertension - BP 120/77   Pulse 100   Wt 204 lb (92.5 kg)   LMP 03/24/2022 (Exact Date)   BMI 31.95 kg/m  - BPs normal today in office.   Term labor symptoms and general obstetric precautions including but not limited to vaginal bleeding, contractions, leaking of fluid and fetal movement were reviewed in detail with the patient. Please refer to After Visit Summary for other counseling recommendations.   Return in about 1 week (around 12/08/2022) for LOB.  Future Appointments  Date Time Provider Department Center  12/06/2022  3:15 PM [facility] CLINICIAN [facility]  12/07/2022  3:35 PM Debroah Loop,  Currie Paris, MD [facility]  12/13/2022  8:15 AM Bernerd Limbo, CNM [facility]    Sabah Zucco Danella Deis) Suzie Portela, MSN, CNM  [facility]  12/01/2022 6:28 PM

## 2022-12-02 ENCOUNTER — Observation Stay (HOSPITAL_COMMUNITY): Payer: Medicaid Other | Admitting: Anesthesiology

## 2022-12-02 ENCOUNTER — Inpatient Hospital Stay (HOSPITAL_COMMUNITY)
Admission: AD | Admit: 2022-12-02 | Discharge: 2022-12-03 | DRG: 807 | Disposition: A | Discharge status: Home / Self Care | Payer: Medicaid Other | Attending: Obstetrics & Gynecology | Admitting: Obstetrics & Gynecology

## 2022-12-02 ENCOUNTER — Other Ambulatory Visit: Payer: Self-pay

## 2022-12-02 ENCOUNTER — Encounter (HOSPITAL_COMMUNITY): Payer: Self-pay | Admitting: Family Medicine

## 2022-12-02 DIAGNOSIS — Z3A37 37 weeks gestation of pregnancy: Secondary | ICD-10-CM

## 2022-12-02 DIAGNOSIS — Z148 Genetic carrier of other disease: Secondary | ICD-10-CM

## 2022-12-02 DIAGNOSIS — O99820 Streptococcus B carrier state complicating pregnancy: Secondary | ICD-10-CM

## 2022-12-02 DIAGNOSIS — Z8616 Personal history of COVID-19: Secondary | ICD-10-CM | POA: Diagnosis not present

## 2022-12-02 DIAGNOSIS — D573 Sickle-cell trait: Secondary | ICD-10-CM | POA: Diagnosis present

## 2022-12-02 DIAGNOSIS — Z3490 Encounter for supervision of normal pregnancy, unspecified, unspecified trimester: Secondary | ICD-10-CM

## 2022-12-02 DIAGNOSIS — O99019 Anemia complicating pregnancy, unspecified trimester: Secondary | ICD-10-CM | POA: Insufficient documentation

## 2022-12-02 DIAGNOSIS — O26893 Other specified pregnancy related conditions, third trimester: Secondary | ICD-10-CM | POA: Diagnosis present

## 2022-12-02 DIAGNOSIS — Z5941 Food insecurity: Secondary | ICD-10-CM | POA: Diagnosis not present

## 2022-12-02 DIAGNOSIS — Z8759 Personal history of other complications of pregnancy, childbirth and the puerperium: Secondary | ICD-10-CM

## 2022-12-02 DIAGNOSIS — O99824 Streptococcus B carrier state complicating childbirth: Secondary | ICD-10-CM | POA: Diagnosis present

## 2022-12-02 DIAGNOSIS — O99344 Other mental disorders complicating childbirth: Secondary | ICD-10-CM | POA: Diagnosis not present

## 2022-12-02 DIAGNOSIS — O9081 Anemia of the puerperium: Secondary | ICD-10-CM | POA: Diagnosis not present

## 2022-12-02 DIAGNOSIS — F319 Bipolar disorder, unspecified: Secondary | ICD-10-CM | POA: Diagnosis present

## 2022-12-02 DIAGNOSIS — D563 Thalassemia minor: Secondary | ICD-10-CM | POA: Diagnosis present

## 2022-12-02 LAB — CBC
HCT: 29.7 % — ABNORMAL LOW (ref 36.0–46.0)
Hemoglobin: 9.5 g/dL — ABNORMAL LOW (ref 12.0–15.0)
MCH: 22.5 pg — ABNORMAL LOW (ref 26.0–34.0)
MCHC: 32 g/dL (ref 30.0–36.0)
MCV: 70.4 fL — ABNORMAL LOW (ref 80.0–100.0)
Platelets: 326 10*3/uL (ref 150–400)
RBC: 4.22 MIL/uL (ref 3.87–5.11)
RDW: 16.1 % — ABNORMAL HIGH (ref 11.5–15.5)
WBC: 15.8 10*3/uL — ABNORMAL HIGH (ref 4.0–10.5)
nRBC: 0 % (ref 0.0–0.2)

## 2022-12-02 LAB — RPR: RPR Ser Ql: NONREACTIVE

## 2022-12-02 LAB — TYPE AND SCREEN
ABO/RH(D): O POS
Antibody Screen: NEGATIVE

## 2022-12-02 MED ORDER — TERBUTALINE SULFATE 1 MG/ML IJ SOLN: 0.2500 mg | Freq: Once | INTRAMUSCULAR | Status: DC | PRN

## 2022-12-02 MED ORDER — LACTATED RINGERS IV SOLN
500.0000 mL | Freq: Once | INTRAVENOUS | Status: AC
  Administered 2022-12-02: 500 mL via INTRAVENOUS

## 2022-12-02 MED ORDER — EPHEDRINE 5 MG/ML INJ: 10.0000 mg | INTRAVENOUS | Status: DC | PRN

## 2022-12-02 MED ORDER — OXYCODONE-ACETAMINOPHEN 5-325 MG PO TABS: 2.0000 | ORAL_TABLET | ORAL | Status: DC | PRN

## 2022-12-02 MED ORDER — SODIUM CHLORIDE 0.9 % IV SOLN: 5.0000 10*6.[IU] | Freq: Once | INTRAVENOUS | Status: DC

## 2022-12-02 MED ORDER — PENICILLIN G POT IN DEXTROSE 60000 UNIT/ML IV SOLN
3.0000 10*6.[IU] | INTRAVENOUS | Status: DC
  Administered 2022-12-02 (×2): 3 10*6.[IU] via INTRAVENOUS
  Filled 2022-12-02 (×2): qty 50

## 2022-12-02 MED ORDER — FENTANYL-BUPIVACAINE-NACL 0.5-0.125-0.9 MG/250ML-% EP SOLN
EPIDURAL | Status: AC
  Filled 2022-12-02: qty 250

## 2022-12-02 MED ORDER — LIDOCAINE HCL (PF) 1 % IJ SOLN
INTRAMUSCULAR | Status: DC | PRN
  Administered 2022-12-02: 11 mL via EPIDURAL

## 2022-12-02 MED ORDER — DIPHENHYDRAMINE HCL 25 MG PO CAPS: 25.0000 mg | ORAL_CAPSULE | Freq: Four times a day (QID) | ORAL | Status: DC | PRN

## 2022-12-02 MED ORDER — SODIUM CHLORIDE 0.9 % IV SOLN
5.0000 10*6.[IU] | Freq: Once | INTRAVENOUS | Status: AC
  Administered 2022-12-02: 5 10*6.[IU] via INTRAVENOUS
  Filled 2022-12-02: qty 5

## 2022-12-02 MED ORDER — BENZOCAINE-MENTHOL 20-0.5 % EX AERO
1.0000 | INHALATION_SPRAY | CUTANEOUS | Status: DC | PRN
  Administered 2022-12-02: 1 via TOPICAL
  Filled 2022-12-02: qty 56

## 2022-12-02 MED ORDER — COCONUT OIL OIL
1.0000 | TOPICAL_OIL | Status: DC | PRN
  Administered 2022-12-02: 1 via TOPICAL

## 2022-12-02 MED ORDER — DIBUCAINE (PERIANAL) 1 % EX OINT: 1.0000 | TOPICAL_OINTMENT | CUTANEOUS | Status: DC | PRN

## 2022-12-02 MED ORDER — SOD CITRATE-CITRIC ACID 500-334 MG/5ML PO SOLN: 30.0000 mL | ORAL | Status: DC | PRN

## 2022-12-02 MED ORDER — OXYTOCIN-SODIUM CHLORIDE 30-0.9 UT/500ML-% IV SOLN
1.0000 m[IU]/min | INTRAVENOUS | Status: DC
  Administered 2022-12-02: 2 m[IU]/min via INTRAVENOUS

## 2022-12-02 MED ORDER — LACTATED RINGERS IV SOLN: INTRAVENOUS | Status: DC

## 2022-12-02 MED ORDER — WITCH HAZEL-GLYCERIN EX PADS: 1.0000 | MEDICATED_PAD | CUTANEOUS | Status: DC | PRN

## 2022-12-02 MED ORDER — PRENATAL MULTIVITAMIN CH
1.0000 | ORAL_TABLET | Freq: Every day | ORAL | Status: DC
  Administered 2022-12-03: 1 via ORAL
  Filled 2022-12-02: qty 1

## 2022-12-02 MED ORDER — TETANUS-DIPHTH-ACELL PERTUSSIS 5-2.5-18.5 LF-MCG/0.5 IM SUSY: 0.5000 mL | PREFILLED_SYRINGE | Freq: Once | INTRAMUSCULAR | Status: DC

## 2022-12-02 MED ORDER — DIPHENHYDRAMINE HCL 50 MG/ML IJ SOLN
12.5000 mg | INTRAMUSCULAR | Status: DC | PRN
  Administered 2022-12-02 (×2): 12.5 mg via INTRAVENOUS
  Filled 2022-12-02 (×2): qty 1

## 2022-12-02 MED ORDER — PENICILLIN G POT IN DEXTROSE 60000 UNIT/ML IV SOLN: 3.0000 10*6.[IU] | INTRAVENOUS | Status: DC

## 2022-12-02 MED ORDER — ACETAMINOPHEN 325 MG PO TABS
650.0000 mg | ORAL_TABLET | ORAL | Status: DC | PRN
  Administered 2022-12-02 (×2): 650 mg via ORAL
  Filled 2022-12-02 (×2): qty 2

## 2022-12-02 MED ORDER — ONDANSETRON HCL 4 MG/2ML IJ SOLN: 4.0000 mg | INTRAMUSCULAR | Status: DC | PRN

## 2022-12-02 MED ORDER — PHENYLEPHRINE 80 MCG/ML (10ML) SYRINGE FOR IV PUSH (FOR BLOOD PRESSURE SUPPORT)
80.0000 ug | PREFILLED_SYRINGE | INTRAVENOUS | Status: DC | PRN
  Filled 2022-12-02: qty 10

## 2022-12-02 MED ORDER — LIDOCAINE HCL (PF) 1 % IJ SOLN: 30.0000 mL | INTRAMUSCULAR | Status: DC | PRN

## 2022-12-02 MED ORDER — ONDANSETRON HCL 4 MG PO TABS: 4.0000 mg | ORAL_TABLET | ORAL | Status: DC | PRN

## 2022-12-02 MED ORDER — SODIUM CHLORIDE 0.9 % IV SOLN: 1.0000 g | INTRAVENOUS | Status: DC

## 2022-12-02 MED ORDER — PHENYLEPHRINE 80 MCG/ML (10ML) SYRINGE FOR IV PUSH (FOR BLOOD PRESSURE SUPPORT)
80.0000 ug | PREFILLED_SYRINGE | INTRAVENOUS | Status: DC | PRN
  Administered 2022-12-02 (×2): 80 ug via INTRAVENOUS

## 2022-12-02 MED ORDER — OXYTOCIN BOLUS FROM INFUSION
333.0000 mL | Freq: Once | INTRAVENOUS | Status: AC
  Administered 2022-12-02: 333 mL via INTRAVENOUS

## 2022-12-02 MED ORDER — ACETAMINOPHEN 325 MG PO TABS: 650.0000 mg | ORAL_TABLET | ORAL | Status: DC | PRN

## 2022-12-02 MED ORDER — MEDROXYPROGESTERONE ACETATE 150 MG/ML IM SUSP
150.0000 mg | Freq: Once | INTRAMUSCULAR | Status: AC
  Administered 2022-12-03: 150 mg via INTRAMUSCULAR
  Filled 2022-12-02: qty 1

## 2022-12-02 MED ORDER — ZOLPIDEM TARTRATE 5 MG PO TABS: 5.0000 mg | ORAL_TABLET | Freq: Every evening | ORAL | Status: DC | PRN

## 2022-12-02 MED ORDER — IBUPROFEN 600 MG PO TABS
600.0000 mg | ORAL_TABLET | Freq: Four times a day (QID) | ORAL | Status: DC
  Administered 2022-12-02 – 2022-12-03 (×5): 600 mg via ORAL
  Filled 2022-12-02 (×5): qty 1

## 2022-12-02 MED ORDER — SIMETHICONE 80 MG PO CHEW: 80.0000 mg | CHEWABLE_TABLET | ORAL | Status: DC | PRN

## 2022-12-02 MED ORDER — FENTANYL CITRATE (PF) 100 MCG/2ML IJ SOLN
INTRAMUSCULAR | Status: AC
  Administered 2022-12-02: 50 ug via INTRAVENOUS
  Filled 2022-12-02: qty 2

## 2022-12-02 MED ORDER — SENNOSIDES-DOCUSATE SODIUM 8.6-50 MG PO TABS
2.0000 | ORAL_TABLET | ORAL | Status: DC
  Administered 2022-12-02: 2 via ORAL
  Filled 2022-12-02: qty 2

## 2022-12-02 MED ORDER — FENTANYL-BUPIVACAINE-NACL 0.5-0.125-0.9 MG/250ML-% EP SOLN
12.0000 mL/h | EPIDURAL | Status: DC | PRN
  Administered 2022-12-02: 12 mL/h via EPIDURAL

## 2022-12-02 MED ORDER — LACTATED RINGERS IV SOLN
500.0000 mL | INTRAVENOUS | Status: DC | PRN
  Administered 2022-12-02: 500 mL via INTRAVENOUS

## 2022-12-02 MED ORDER — ONDANSETRON HCL 4 MG/2ML IJ SOLN
4.0000 mg | Freq: Four times a day (QID) | INTRAMUSCULAR | Status: DC | PRN
  Administered 2022-12-02: 4 mg via INTRAVENOUS
  Filled 2022-12-02: qty 2

## 2022-12-02 MED ORDER — FENTANYL CITRATE (PF) 100 MCG/2ML IJ SOLN: 50.0000 ug | Freq: Once | INTRAMUSCULAR | Status: AC

## 2022-12-02 MED ORDER — SODIUM CHLORIDE 0.9 % IV SOLN: 2.0000 g | Freq: Once | INTRAVENOUS | Status: DC

## 2022-12-02 MED ORDER — FENTANYL CITRATE (PF) 100 MCG/2ML IJ SOLN
100.0000 ug | INTRAMUSCULAR | Status: DC | PRN
  Administered 2022-12-02 (×2): 100 ug via INTRAVENOUS
  Filled 2022-12-02 (×2): qty 2

## 2022-12-02 MED ORDER — OXYTOCIN-SODIUM CHLORIDE 30-0.9 UT/500ML-% IV SOLN
2.5000 [IU]/h | INTRAVENOUS | Status: DC
  Filled 2022-12-02: qty 500

## 2022-12-02 MED ORDER — OXYCODONE-ACETAMINOPHEN 5-325 MG PO TABS: 1.0000 | ORAL_TABLET | ORAL | Status: DC | PRN

## 2022-12-02 NOTE — MAU Note (Signed)
21yo [redacted]w[redacted]d G4P1 complains of contraction pain radiating to legs "coming back to back", 10/10 pain. States that she was checked in the office 5/31 and she was 6.5 cm dilated and was told to come to the hospital but was not able to come until now. Endorses good FM, denies bleeding/LOF.

## 2022-12-02 NOTE — Discharge Summary (Signed)
Postpartum Discharge Summary  Date of Service updated***     Patient Name: Heidi Martin DOB: 2000-11-04 MRN: [id number]  Date of admission: 12/02/2022 Delivery date:12/02/2022  Delivering provider: Celedonio Savage  Date of discharge: 12/02/2022  Admitting diagnosis: Indication for care in labor and delivery, antepartum [O75.9] Intrauterine pregnancy: [redacted]w[redacted]d     Secondary diagnosis:  Principal Problem:   Indication for care in labor and delivery, antepartum Active Problems:   Supervision of low-risk pregnancy   Alpha thalassemia silent carrier   Carrier of spinal muscular atrophy   History of FGR   GBS (group B Streptococcus carrier), +RV culture, currently pregnant   Vaginal delivery  Additional problems: ***    Discharge diagnosis: Term Pregnancy Delivered                                              Post partum procedures:{Postpartum procedures:23558} Augmentation: AROM and Pitocin Complications: None  Hospital course: Onset of Labor With Vaginal Delivery      22 y.o. yo [id number] at [redacted]w[redacted]d was admitted in Active Labor on 12/02/2022. Labor course was uncomplicated.  Membrane Rupture Time/Date: 2:10 PM ,12/02/2022   Delivery Method:Vaginal, Spontaneous  Episiotomy: None  Lacerations:    Patient had a postpartum course complicated by ***.  She is ambulating, tolerating a regular diet, passing flatus, and urinating well. Patient is discharged home in stable condition on 12/02/22.  Newborn Data: Birth date:12/02/2022  Birth time:5:01 PM  Gender:Female  Living status:Living  Apgars:9 ,9  Weight:3400 g   Magnesium Sulfate received: No BMZ received: No Rhophylac:N/A MMR:N/A T-DaP: ordered postpartum Flu: N/A Transfusion:{Transfusion received:30440034}  Physical exam  Vitals:   12/02/22 1722 12/02/22 1730 12/02/22 1745 12/02/22 1800  BP: 102/82 (!) 112/57 111/82   Pulse: (!) 111 81 (!) 137   Resp: 18 18 18 18   Temp:      TempSrc:      Weight:      Height:        General: {Exam; general:21111117} Lochia: {Desc; appropriate/inappropriate:30686::"appropriate"} Uterine Fundus: {Desc; firm/soft:30687} Incision: {Exam; incision:21111123} DVT Evaluation: {Exam; dvt:2111122} Labs: Lab Results  Component Value Date   WBC 15.8 (H) 12/02/2022   HGB 9.5 (L) 12/02/2022   HCT 29.7 (L) 12/02/2022   MCV 70.4 (L) 12/02/2022   PLT 326 12/02/2022      Latest Ref Rng & Units 05/22/2022   10:30 PM  CMP  Glucose 70 - 99 mg/dL 76   BUN 6 - 20 mg/dL 5   Creatinine 1.47 - 8.29 mg/dL 5.62   Sodium 130 - 865 mmol/L 138   Potassium 3.5 - 5.1 mmol/L 3.8   Chloride 98 - 111 mmol/L 104   CO2 22 - 32 mmol/L 23   Calcium 8.9 - 10.3 mg/dL 8.9   Total Protein 6.5 - 8.1 g/dL 6.3   Total Bilirubin 0.3 - 1.2 mg/dL 0.4   Alkaline Phos 38 - 126 U/L 54   AST 15 - 41 U/L 17   ALT 0 - 44 U/L 13    Edinburgh Score:    01/05/2021    3:50 PM  Edinburgh Postnatal Depression Scale Screening Tool  I have been able to laugh and see the funny side of things. 0  I have looked forward with enjoyment to things. 0  I have blamed myself unnecessarily when things went wrong. 0  I have been anxious or worried for no good reason. 0  I have felt scared or panicky for no good reason. 0  Things have been getting on top of me. 0  I have been so unhappy that I have had difficulty sleeping. 0  I have felt sad or miserable. 0  I have been so unhappy that I have been crying. 0  The thought of harming myself has occurred to me. 0  Edinburgh Postnatal Depression Scale Total 0     After visit meds:  Allergies as of 12/02/2022       Reactions   Milk [milk (cow)] Nausea And Vomiting   Stomach cramping     Med Rec must be completed prior to using this [facility]***        Discharge home in stable condition Infant Feeding: {Baby feeding:23562} Infant Disposition:{CHL IP OB HOME WITH ZOXWRU:04540} Discharge instruction: per After Visit Summary and Postpartum booklet. Activity:  Advance as tolerated. Pelvic rest for 6 weeks.  Diet: {OB JWJX:91478295} Future Appointments: Future Appointments  Date Time Provider Department Center  12/06/2022  3:15 PM [facility]  12/07/2022  3:35 PM Adam Phenix, MD [facility]  12/13/2022  8:15 AM Bernerd Limbo, CNM [facility]   Follow up Visit: Message sent   Please schedule this patient for a In person postpartum visit in 6 weeks with the following provider: Any provider. Additional Postpartum F/U:Postpartum Depression checkup  Low risk pregnancy complicated by:  None Delivery mode:  Vaginal, Spontaneous  Anticipated Birth Control:  Depo   12/02/2022 Celedonio Savage, MD

## 2022-12-02 NOTE — H&P (Addendum)
OBSTETRIC ADMISSION HISTORY AND PHYSICAL  Heidi Martin is a 22 y.o. female [phone] with IUP at [redacted]w[redacted]d by u/s presenting for SOL?/advanced dilatation. She reports +FMs, No LOF, no VB, no blurry vision, headaches or peripheral edema, and RUQ pain.  She plans on breast feeding. She request undecided for birth control. 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; unclear if FOB tested         Nursing Staff Provider  Office Location [facility] Dating  12/20/2022, by Ultrasound  [facility] Model [ ]  Traditional [ X] Centering [ ]  Mom-Baby Dyad Anatomy US     Language  English      Flu Vaccine  06/12/2022 Genetic/Carrier Screen  NIPS: LR AFP:    Horizon: SCT, alpha thal carrier, & SMA carrier (06/2020)  TDaP Vaccine    Hgb A1C or  GTT Early 5.5 Third trimester   COVID Vaccine Moderna (both)   LAB RESULTS   Rhogam  --/--/O POS (04/27 1128)  Blood Type --/--/O POS (04/27 1128)   Baby Feeding Plan Breast  Antibody NEG (04/27 1128)  Contraception Undecided  Rubella   Circumcision Yes if boy RPR     Pediatrician  [facility] HBsAg     Support Person Christopher  HCVAb     Prenatal Classes   HIV Non Reactive (04/03 0319)     BTL Consent   GBS (For PCN allergy, check sensitivities)   VBAC Consent   Pap  LSIL 06/2022 - for rpt in 1 year           DME Rx [ X] BP cuff [ ] X Weight Scale Waterbirth  [ ]  Class [ ]  Consent [ ]  CNM visit  PHQ9 & GAD7 [  ] new OB [  ] 28 weeks  [  ] 36 weeks Induction  [ ]  Orders Entered [ ] Foley Y/N     Past Medical History: Past Medical History:  Diagnosis Date   Alpha thalassemia silent carrier    Chronic intractable headache 10/13/2014   COVID-19    Depression    Fatigue  05/31/2015   Irritable bowel syndrome (IBS)    Musculoskeletal pain 05/31/2015   PID (acute pelvic inflammatory disease)    Sickle cell trait (HCC)    Trichomonas infection    UTI (urinary tract infection)     Past Surgical History: Past Surgical History:  Procedure Laterality Date   DILATION AND EVACUATION N/A 10/27/2021   Procedure: DILATATION AND EVACUATION;  Surgeon: Harvey Bing, MD;  Location: [facility] OR;  Service: Gynecology;  Laterality: N/A;    Obstetrical History: OB History     Gravida  4   Para  1   Term  1   Preterm      AB  2   Living  1      SAB  1   IAB  1   Ectopic      Multiple  0   Live Births  1           Social History Social History   Socioeconomic History   Marital status: Single    Spouse name: Not on file   Number of children: Not on file  Years of education: Not on file   Highest education level: Not on file  Occupational History   Not on file  Tobacco Use   Smoking status: Never   Smokeless tobacco: Never  Vaping Use   Vaping Use: Never used  Substance and Sexual Activity   Alcohol use: No   Drug use: No   Sexual activity: Not Currently    Birth control/protection: None    Comment: last IC Saturday 11/11/22  Other Topics Concern   Not on file  Social History Narrative   Not on file   Social Determinants of Health   Financial Resource Strain: Not on file  Food Insecurity: Food Insecurity Present (11/22/2022)   Hunger Vital Sign    Worried About Running Out of Food in the Last Year: Sometimes true    Ran Out of Food in the Last Year: Sometimes true  Transportation Needs: No Transportation Needs (11/22/2022)   PRAPARE - Administrator, Civil Service (Medical): No    Lack of Transportation (Non-Medical): No  Physical Activity: Not on file  Stress: Not on file  Social Connections: Not on file    Family History: Family History  Problem Relation Age of Onset   Cancer Mother    Depression Mother     Miscarriages / Stillbirths Mother    Hypertension Mother    Hypertension Father    Cancer Maternal Grandmother    Diabetes Maternal Grandmother    Miscarriages / Stillbirths Maternal Grandmother     Allergies: Allergies  Allergen Reactions   Milk [Milk (Cow)] Nausea And Vomiting    Stomach cramping    Medications Prior to Admission  Medication Sig Dispense Refill Last Dose   acetaminophen (TYLENOL) 500 MG tablet Take 500 mg by mouth every 6 (six) hours as needed.   12/01/2022   cyclobenzaprine (FLEXERIL) 10 MG tablet Take 1 tablet (10 mg total) by mouth 3 (three) times daily as needed for muscle spasms. 30 tablet 1 Past Week   Prenatal Vit-Min-FA-Fish Oil ([employer] PRENATAL GUMMY) 0.4-113.5 MG CHEW Chew 1 each by mouth daily after breakfast. 60 tablet 3 12/01/2022   Blood Pressure Monitoring (BLOOD PRESSURE KIT) DEVI 1 each by Does not apply route 3 times/day as needed-between meals & bedtime. 1 each 0    cyclobenzaprine (FLEXERIL) 10 MG tablet Take 1 tablet (10 mg total) by mouth every 8 (eight) hours as needed for muscle spasms. 30 tablet 1    famotidine (PEPCID) 20 MG tablet Take 1 tablet (20 mg total) by mouth at bedtime. 30 tablet 0 More than a month   hydrOXYzine (ATARAX) 25 MG tablet Take 1 tablet (25 mg total) by mouth at bedtime. Can increase to every 8 hours if it does not cause sleepiness. 30 tablet 1 More than a month   Lidocaine 4 % GEL Apply 1 Application topically 3 (three) times daily as needed. 10 g 1    Misc. Devices (GOJJI WEIGHT SCALE) MISC 1 each by Does not apply route as needed. 1 each 0    polyethylene glycol powder (GLYCOLAX/MIRALAX) 17 GM/SCOOP powder Take 17 g by mouth daily. 255 g 0 More than a month     Review of Systems   All systems reviewed and negative except as stated in HPI  Blood pressure 106/66, pulse 95, temperature (!) 97.5 F (36.4 C), temperature source Oral, resp. rate 16, height 5\' 7"  (1.702 m), weight 91.9 kg, last menstrual period  03/24/2022, unknown if currently breastfeeding. General appearance: alert  and anxious Lungs: normal effort  Heart: regular rate noted Abdomen:gravid Extremities: No LE edema Presentation: cephalic Fetal monitoringBaseline: 110 bpm, Variability: Good {> 6 bpm), Accelerations: present, and Decelerations: Absent Uterine activityFrequency: Every 4-6 minutes Dilation: 6 Effacement (%): 90 Station: -1 Exam by:: K Faucett RN   Prenatal labs: ABO, Rh: O/Positive/-- (12/11 1009) Antibody: Negative (12/11 1009) Rubella: 3.93 (12/11 1009) RPR: Non Reactive (03/13 0830)  HBsAg: Negative (12/11 1009)  HIV: Non Reactive (03/13 0830)  GBS: Positive/-- (05/16 0758)  1 hr Glucola 46, 79, 76 Genetic screening  LR NIPS Anatomy US nml, female  Prenatal Transfer Tool  Maternal Diabetes: No Genetic Screening: Abnormal:  Results: Other: NIPS: LR; Horizon: SCT, alpha thal carrier, & SMA carrier (06/2020) Maternal Ultrasounds/Referrals: Normal Fetal Ultrasounds or other Referrals:  None Maternal Substance Abuse:  No Significant Maternal Medications:  None Significant Maternal Lab Results:  Group B Strep positive Number of Prenatal Visits:greater than 3 verified prenatal visits Other Comments:   screened positive for food insecurity  No results found for this or any previous visit (from the past 24 hour(s)).  Patient Active Problem List   Diagnosis Date Noted   GBS (group B Streptococcus carrier), +RV culture, currently pregnant 11/20/2022   LSIL pap 06/16/2022   Alpha thalassemia silent carrier 06/12/2022   Carrier of spinal muscular atrophy 06/12/2022   Bipolar disorder & ADHD 06/12/2022   Migraine headache 06/12/2022   History of FGR 06/12/2022   Supervision of low-risk pregnancy 06/01/2022   Nausea/vomiting in pregnancy 05/08/2022   Sickle cell trait (HCC) 05/31/2015    Assessment/Plan:  Heidi Martin is a 22 y.o. [id number] at [redacted]w[redacted]d here for suspected SOL/advanced dilatation c/b  GBS+  #Labor: Will observe. Plan recheck cervical exam 2 hrs from last. No augmentation planned.  #Pain: Per patient request, epidural desired #FWB: Cat 1 #ID:  GBS positive, PCN intrapartum #MOF: breast #MOC: undecided #Circ:  yes #Food insecurity: SW consult pp  [name], DO  12/02/2022, 1:34 AM  GME ATTESTATION:  I saw and evaluated the patient. I agree with the findings and the plan of care as documented in the resident's note. I have made changes to documentation as necessary.  Lavonda Jumbo, DO OB Fellow, Faculty [facility], [facility] 12/02/2022, 3:13 AM

## 2022-12-02 NOTE — Anesthesia Procedure Notes (Signed)
Epidural Patient location during procedure: OB Start time: 12/02/2022 12:55 PM End time: 12/02/2022 1:08 PM  Staffing Anesthesiologist: Lowella Curb, MD Performed: anesthesiologist   Preanesthetic Checklist Completed: patient identified, IV checked, site marked, risks and benefits discussed, surgical consent, monitors and equipment checked, pre-op evaluation and timeout performed  Epidural Patient position: sitting Prep: ChloraPrep Patient monitoring: heart rate, cardiac monitor, continuous pulse ox and blood pressure Approach: midline Location: L2-L3 Injection technique: LOR saline  Needle:  Needle type: Tuohy  Needle gauge: 17 G Needle length: 9 cm Needle insertion depth: 6 cm Catheter type: closed end flexible Catheter size: 20 Guage Catheter at skin depth: 10 cm Test dose: negative  Assessment Events: blood not aspirated, injection not painful, no injection resistance, no paresthesia and negative IV test  Additional Notes Reason for block:procedure for pain

## 2022-12-02 NOTE — Progress Notes (Signed)
Labor Progress Note Heidi Martin is a 22 y.o. [phone] at [redacted]w[redacted]d presented for SOL?/adv dilation S: Feeling more uncomfortable with ctx  O:  BP 115/64   Pulse 74   Temp (!) 97.5 F (36.4 C) (Oral)   Resp 16   Ht 5\' 7"  (1.702 m)   Wt 91.9 kg   LMP 03/24/2022 (Exact Date)   BMI 31.73 kg/m  EFM: 115/mod var/+accels/no decels  CVE: Dilation: 6 Effacement (%): 90 Station: -1 Presentation: Vertex Exam by:: Deland Pretty RN   A&P: 22 y.o. [id number] [redacted]w[redacted]d SOL?/adv dilation #Labor: No change in cervix over past 2 checks. Requesting epidural however she does not meet criteria for labor at this point. Encouraged ambulation, movement. Will recheck in 3-4 hours.  #Pain: IV pain meds PRN #FWB: cat 1 #GBS positive, intrapartum PCN #Food insecurity: SW consult pp    [name], DO 5:34 AM

## 2022-12-02 NOTE — Progress Notes (Signed)
Circumcision Consent  Discussed with mom at bedside about circumcision.   Circumcision is a surgery that removes the skin that covers the tip of the penis, called the "foreskin." Circumcision is usually done when a boy is between 51 and 56 days old, sometimes up to 76-20 weeks old.  The most common reasons boys are circumcised include for cultural/religious beliefs or for parental preference (potentially easier to clean, so baby looks like daddy, etc).  There may be some medical benefits for circumcision:   Circumcised boys seem to have slightly lower rates of: ? Urinary tract infections (per the American Academy of Pediatrics an uncircumcised boy has a 1/100 chance of developing a UTI in the first year of life, a circumcised boy at a 07/998 chance of developing a UTI in the first year of life- a 10% reduction) ? Penis cancer (typically rare- an uncircumcised female has a 1 in 100,000 chance of developing cancer of the penis) ? Sexually transmitted infection (in endemic areas, including HIV, HPV and Herpes- circumcision does NOT protect against gonorrhea, chlamydia, trachomatis, or syphilis) ? Phimosis: a condition where that makes retraction of the foreskin over the glans impossible (0.4 per 1000 boys per year or 0.6% of boys are affected by their 15th birthday)  Boys and men who are not circumcised can reduce these extra risks by: ? Cleaning their penis well ? Using condoms during sex  What are the risks of circumcision?  As with any surgical procedure, there are risks and complications. In circumcision, complications are rare and usually minor, the most common being: ? Bleeding- risk is reduced by holding each clamp for 30 seconds prior to a cut being made, and by holding pressure after the procedure is done ? Infection- the penis is cleaned prior to the procedure, and the procedure is done under sterile technique ? Damage to the urethra or amputation of the penis  How is circumcision done  in baby boys?  The baby will be placed on a special table and the legs restrained for their safety. Numbing medication is injected into the penis, and the skin is cleansed with betadine to decrease the risk of infection.   What to expect:  The penis will look red and raw for 5-7 days as it heals. We expect scabbing around where the cut was made, as well as clear-pink fluid and some swelling of the penis right after the procedure. If your baby's circumcision starts to bleed or develops pus, please contact your pediatrician immediately.  All questions were answered and mother consented.  Celedonio Savage, MD 6:22 PM

## 2022-12-02 NOTE — Anesthesia Preprocedure Evaluation (Signed)
Anesthesia Evaluation  Patient identified by MRN, date of birth, ID band Patient awake    Reviewed: Allergy & Precautions, NPO status , Patient's Chart, lab work & pertinent test results  Airway Mallampati: II  TM Distance: >3 FB Neck ROM: Full    Dental no notable dental hx.    Pulmonary neg pulmonary ROS   Pulmonary exam normal breath sounds clear to auscultation       Cardiovascular negative cardio ROS Normal cardiovascular exam Rhythm:Regular Rate:Normal     Neuro/Psych  Headaches  negative psych ROS   GI/Hepatic negative GI ROS, Neg liver ROS,,,  Endo/Other  negative endocrine ROS    Renal/GU negative Renal ROS  negative genitourinary   Musculoskeletal negative musculoskeletal ROS (+)    Abdominal   Peds  Hematology negative hematology ROS (+) Blood dyscrasia, Sickle cell trait   Anesthesia Other Findings IOL for IUGR  Reproductive/Obstetrics (+) Pregnancy                             Anesthesia Physical Anesthesia Plan  ASA: II  Anesthesia Plan: Epidural   Post-op Pain Management:    Induction:   PONV Risk Score and Plan: Treatment may vary due to age or medical condition  Airway Management Planned: Natural Airway  Additional Equipment:   Intra-op Plan:   Post-operative Plan:   Informed Consent: I have reviewed the patients History and Physical, chart, labs and discussed the procedure including the risks, benefits and alternatives for the proposed anesthesia with the patient or authorized representative who has indicated his/her understanding and acceptance.       Plan Discussed with: Anesthesiologist  Anesthesia Plan Comments: (Patient identified. Risks, benefits, options discussed with patient including but not limited to bleeding, infection, nerve damage, paralysis, failed block, incomplete pain control, headache, blood pressure changes, nausea, vomiting,  reactions to medication, itching, and post partum back pain. Confirmed with bedside nurse the patient's most recent platelet count. Confirmed with the patient that they are not taking any anticoagulation, have any bleeding history or any family history of bleeding disorders. Patient expressed understanding and wishes to proceed. All questions were answered. )        Anesthesia Quick Evaluation

## 2022-12-02 NOTE — Progress Notes (Signed)
Labor Progress Note Heidi Martin is a 22 y.o. [phone] at [redacted]w[redacted]d presented for SOL S: Crying through contractions   O:  BP 110/72   Pulse 88   Temp 98.5 F (36.9 C) (Oral)   Resp (!) 8   Ht 5\' 7"  (1.702 m)   Wt 91.9 kg   LMP 03/24/2022 (Exact Date)   BMI 31.73 kg/m  EFM: 120 bpm/mod var/+accels/no decels  CVE: Dilation: 7 Effacement (%): 90 Station: 0 Presentation: Vertex Exam by:: Sheina Mcleish, MD   A&P: 22 y.o. [id number] [redacted]w[redacted]d SOL?/adv dilation #Labor: Noticeable cervical change between this check in the last check.  Station is similar but she is dilated 1 cm more.  Patient will get epidural and we will rupture #Pain: Patient requesting epidural.  Given cervical change she cannot have epidural #FWB: cat 1 #GBS positive, intrapartum PCN #Food insecurity: SW consult pp    Celedonio Savage, MD 5:34 AM

## 2022-12-03 DIAGNOSIS — O99019 Anemia complicating pregnancy, unspecified trimester: Secondary | ICD-10-CM | POA: Insufficient documentation

## 2022-12-03 MED ORDER — IBUPROFEN 600 MG PO TABS: 600.0000 mg | ORAL_TABLET | Freq: Four times a day (QID) | ORAL | 0 refills | Status: DC

## 2022-12-03 MED ORDER — FERROUS SULFATE 325 (65 FE) MG PO TABS
325.0000 mg | ORAL_TABLET | ORAL | Status: DC
  Administered 2022-12-03: 325 mg via ORAL
  Filled 2022-12-03: qty 1

## 2022-12-03 MED ORDER — FERROUS SULFATE 325 (65 FE) MG PO TABS: 325.0000 mg | ORAL_TABLET | ORAL | 0 refills | Status: DC

## 2022-12-03 NOTE — Progress Notes (Signed)
Post Partum Day 1 Subjective: Heidi Martin is doing well this morning. No concerns today. She is up ad lib, voiding, tolerating PO, and + flatus. She is breast and bottle feeding.  Objective: Blood pressure 104/68, pulse 78, temperature 98.4 F (36.9 C), temperature source Oral, resp. rate 18, height 5\' 7"  (1.702 m), weight 91.9 kg, last menstrual period 03/24/2022, SpO2 98 %, unknown if currently breastfeeding.  Physical Exam:  General: alert and no distress Lochia: appropriate Uterine Fundus: firm Incision: n/a DVT Evaluation: No evidence of DVT seen on physical exam.  Recent Labs    12/02/22 0147  HGB 9.5*  HCT 29.7*    Assessment/Plan: Patient would like late discharge this evening if baby is able to be discharged home. Will plan for discharge tonight.  Depo prior to discharge Anemia. Will order PO iron supplement Circumcision prior to discharge    LOS: 1 day   Brand Males, CNM 12/03/2022, 1:48 PM

## 2022-12-03 NOTE — Anesthesia Postprocedure Evaluation (Signed)
Anesthesia Post Note  Patient: Heidi Martin  Procedure(s) Performed: AN AD HOC LABOR EPIDURAL     Patient location during evaluation: Mother Baby Anesthesia Type: Epidural Level of consciousness: awake and alert Pain management: pain level controlled Vital Signs Assessment: post-procedure vital signs reviewed and stable Respiratory status: spontaneous breathing, nonlabored ventilation and respiratory function stable Cardiovascular status: stable Postop Assessment: no headache, no backache, epidural receding, no apparent nausea or vomiting, patient able to bend at knees, able to ambulate and adequate PO intake Anesthetic complications: no   No notable events documented.  Last Vitals:  Vitals:   12/03/22 0428 12/03/22 0815  BP: 98/69 104/68  Pulse: 81 78  Resp: 16 18  Temp: 36.7 C 36.9 C  SpO2: 100% 98%    Last Pain:  Vitals:   12/03/22 1125  TempSrc:   PainSc: 5    Pain Goal: Patients Stated Pain Goal: 2 (12/03/22 1125)                 Ivann Trimarco Hristova

## 2022-12-03 NOTE — Lactation Note (Signed)
This note was copied from a baby's chart. Lactation Consultation Note  Patient Name: Heidi Martin [id number] Date: 12/03/2022 Age:22 hours Reason for consult: Initial assessment;Early term 80-38.6wks Mom stated the baby had latched on the breast and he done well but she is asking for formula because she didn't think he was getting anything. Mom's feeding choice if BF/formula. Breast tissue very compressible and colostrum easily expressed. Shells given to evert nipples and suggested to pre-pump before latching if needed. Mom is interested in pumping. Mom shown how to use DEBP & how to disassemble, clean, & reassemble parts. Mom pumped while LC in rm.  Mom encouraged to feed baby 8-12 times/24 hours and with feeding cues. Mom has given formula recently. Mom stated the baby has been spitting up a lot.  Newborn feeding habits, behavior, STS, I&O reviewed. Encouraged mom to call for assistance as needed.  Maternal Data Has patient been taught Hand Expression?: Yes Does the patient have breastfeeding experience prior to this delivery?: Yes How long did the patient breastfeed?: 3 months  Feeding Nipple Type: Slow - flow  LATCH Score       Type of Nipple: Inverted (Rt. nipple inverted/Lt. everted)  Comfort (Breast/Nipple): Soft / non-tender         Lactation Tools Discussed/Used Tools: Pump;Flanges Flange Size: 21 Breast pump type: Double-Electric Breast Pump Pump Education: Setup, frequency, and cleaning;Milk Storage Reason for Pumping: to evert nipples/supplementation Pumping frequency: q3 hr Pumped volume: 0 mL  Interventions Interventions: Breast feeding basics reviewed;Assisted with latch;Skin to skin;Breast massage;Hand express;Pre-pump if needed;Breast compression;Adjust position;Support pillows;Position options;Shells;DEBP;LC Services brochure  Discharge    Consult Status Consult Status: Follow-up Date: 12/03/22 Follow-up type: In-patient    Heidi Martin, Diamond Nickel 12/03/2022, 5:04 AM

## 2022-12-03 NOTE — Lactation Note (Signed)
This note was copied from a baby's chart. Lactation Consultation Note  Patient Name: Heidi Martin [id number] Date: 12/03/2022 Age:22 hours Reason for consult: Follow-up assessment;Early term 37-38.6wks;Engorgement;Breastfeeding assistance;RN request  LC in to room for follow up. Parent states she has two areas of swelling in the right breast. Infant seems to prefer the left breast and it is difficult to latch. Breast seems soft and intact during assessment. LC assisted with latch to right breast with ease. Reviewed positioning, alignment, use of pillows and neck/back support. Able to noticed good sucking and swallowing. Parent is pace bottle-feeding and have volume guidelines for formula feeding. Discussed normal behavior and patterns after 24h, voids and stools as signs good intake, pumping, clusterfeeding, skin to skin. Talked about milk coming into volume and managing engorgement.   Plan: 1-Feeding on demand or 8-12 times in 24h period. 2-Hand express/pump as needed for supplementation 3-Follow the volume and positioning recommendations for formula feeding  4-Encouraged birthing parent rest, hydration and food intake.   Contact LC as needed for feeds/support/concerns/questions. All questions answered at this time. Reviewed LC brochure.    Maternal Data Has patient been taught Hand Expression?: Yes  Feeding Mother's Current Feeding Choice: Breast Milk and Formula  LATCH Score Latch: Grasps breast easily, tongue down, lips flanged, rhythmical sucking.  Audible Swallowing: Spontaneous and intermittent  Type of Nipple: Everted at rest and after stimulation (small and short shafted)  Comfort (Breast/Nipple): Soft / non-tender  Hold (Positioning): Assistance needed to correctly position infant at breast and maintain latch.  LATCH Score: 9   Lactation Tools Discussed/Used Tools: Pump;Flanges Flange Size: 21 (needs smaller) Breast pump type: Manual;Double-Electric Breast  Pump Reason for Pumping: stimulation and supplementation Pumping frequency: as needed  Interventions Interventions: Breast feeding basics reviewed;Assisted with latch;Skin to skin;Breast massage;Hand express;Pre-pump if needed;Expressed milk;Hand pump;Education;LC Services brochure  Discharge Discharge Education: Engorgement and breast care;Warning signs for feeding baby WIC Program: Yes  Consult Status Consult Status: Complete Date: 12/03/22 Follow-up type: Call as needed    Merion Grimaldo A Higuera Ancidey 12/03/2022, 1139 AM

## 2022-12-06 ENCOUNTER — Ambulatory Visit: Payer: Medicaid Other | Admitting: Clinical

## 2022-12-07 ENCOUNTER — Encounter: Payer: Medicaid Other | Admitting: Obstetrics & Gynecology

## 2022-12-11 ENCOUNTER — Telehealth (HOSPITAL_COMMUNITY): Payer: Self-pay | Admitting: *Deleted

## 2022-12-11 NOTE — Telephone Encounter (Signed)
Attempted hospital discharge follow-up call. Left message for patient to return RN call with any questions or concerns. Deforest Hoyles, RN, 12/11/22, [phone]

## 2022-12-13 ENCOUNTER — Encounter: Payer: Medicaid Other | Admitting: Certified Nurse Midwife

## 2022-12-27 ENCOUNTER — Encounter: Payer: Self-pay | Admitting: Certified Nurse Midwife

## 2023-01-09 ENCOUNTER — Ambulatory Visit (INDEPENDENT_AMBULATORY_CARE_PROVIDER_SITE_OTHER): Payer: Medicaid Other | Admitting: Obstetrics and Gynecology

## 2023-01-09 ENCOUNTER — Encounter: Payer: Self-pay | Admitting: Family Medicine

## 2023-01-09 ENCOUNTER — Encounter: Payer: Self-pay | Admitting: Obstetrics and Gynecology

## 2023-01-09 DIAGNOSIS — F319 Bipolar disorder, unspecified: Secondary | ICD-10-CM

## 2023-01-09 MED ORDER — ETONOGESTREL-ETHINYL ESTRADIOL 0.12-0.015 MG/24HR VA RING
VAGINAL_RING | VAGINAL | 12 refills | Status: DC
Start: 2023-01-09 — End: 2023-11-14

## 2023-01-09 NOTE — Progress Notes (Signed)
Post Partum Visit Note  Heidi Martin is a 22 y.o. [phone] female who presents for a postpartum visit. She is 6 weeks postpartum following a normal spontaneous vaginal delivery.  I have fully reviewed the prenatal and intrapartum course. The delivery was at 37 gestational weeks.  Anesthesia: epidural. Postpartum course has been uncomplicated. Baby is doing well. Baby is feeding by bottle - similac (advance) . Bleeding thick, heavy lochia. Bowel function is abnormal: every 2-3 days . Bladder function is abnormal: only going once or twice a day . Patient is not sexually active. Contraception method is Depo-Provera injections. Postpartum depression screening: positive.   The pregnancy intention screening data noted above was reviewed. Potential methods of contraception were discussed. The patient elected to proceed with No data recorded.   Edinburgh Postnatal Depression Scale - 01/09/23 1500       Edinburgh Postnatal Depression Scale:  In the Past 7 Days   I have been able to laugh and see the funny side of things. 0    I have looked forward with enjoyment to things. 0    I have blamed myself unnecessarily when things went wrong. 0    I have been anxious or worried for no good reason. 1    I have felt scared or panicky for no good reason. 2    Things have been getting on top of me. 3    I have been so unhappy that I have had difficulty sleeping. 2    I have felt sad or miserable. 2    I have been so unhappy that I have been crying. 2    The thought of harming myself has occurred to me. 0    Edinburgh Postnatal Depression Scale Total 12             Health Maintenance Due  Topic Date Due   HPV VACCINES (1 - 2-dose series) Never done   COVID-19 Vaccine (3 - 2023-24 season) 03/03/2022    The following portions of the patient's history were reviewed and updated as appropriate: allergies, current medications, past family history, past medical history, past social history, past  surgical history, and problem list.  Review of Systems Pertinent items are noted in HPI.  Objective:  BP 121/76   Pulse 75   Ht 5\' 7"  (1.702 m)   Wt 184 lb (83.5 kg)   Breastfeeding No   BMI 28.82 kg/m    General:  alert, cooperative, and no distress   Breasts:  not indicated  Lungs: Normal effort  Heart:  Regular rate  Abdomen: soft, non-tender; bowel sounds normal; no masses,  no organomegaly   Wound N/a  GU exam:   Declines       Assessment:   6 wk postpartum exam.   Plan:   Essential components of care per ACOG recommendations:  1.  Mood and well being: Patient with positive depression screening today. Reviewed local resources for support. IBH referral done.  - Patient tobacco use? No.   - hx of drug use? No.    2. Infant care and feeding:  -Patient currently breastmilk feeding? No.  -Social determinants of health (SDOH) reviewed in EPIC. No concerns  3. Sexuality, contraception and birth spacing - Patient does not want a pregnancy in the next year.  Desired family size is maybe 4 children.  - Reviewed reproductive life planning. Reviewed contraceptive methods based on pt preferences and effectiveness.  Patient desired Vaginal Ring today.   - Discussed  birth spacing of 18 months  4. Sleep and fatigue -Encouraged family/partner/community support of 4 hrs of uninterrupted sleep to help with mood and fatigue  5. Physical Recovery  - Discussed patients delivery and complications. She describes her labor as good. - Patient had a Vaginal, no problems at delivery. Patient had  no  laceration.  - Patient has urinary incontinence? No. - Patient is safe to resume physical and sexual activity - For her bleeding, likely normal but will check TVUS to confirm no RPOC. Amb   6.  Health Maintenance - HM due items addressed Yes - Last pap smear  Diagnosis  Date Value Ref Range Status  06/12/2022 - Low grade squamous intraepithelial lesion (LSIL) (A)  Final   Pap smear  not done at today's visit. Due 06/2023.  -Breast Cancer screening indicated? No.   7. Chronic Disease/Pregnancy Condition follow up: None  - PCP follow up  Milas Hock, MD Center for [facility], [facility]

## 2023-01-11 ENCOUNTER — Ambulatory Visit (HOSPITAL_COMMUNITY)
Admission: RE | Admit: 2023-01-11 | Discharge: 2023-01-11 | Disposition: A | Discharge status: Home / Self Care | Payer: Medicaid Other | Source: Ambulatory Visit | Attending: Obstetrics and Gynecology | Admitting: Obstetrics and Gynecology

## 2023-01-15 ENCOUNTER — Ambulatory Visit: Payer: Medicaid Other | Admitting: Obstetrics and Gynecology

## 2023-01-16 NOTE — BH Specialist Note (Signed)
Integrated Behavioral Health via Telemedicine Visit  01/16/2023 Heidi Martin [id number]  Number of Integrated Behavioral Health Clinician visits: 1- Initial Visit  Session Start time: 1448   Session End time: 1510  Total time in minutes: 22   Referring Provider: *** Patient/Family location: [facility] Provider location: *** All persons participating in visit: *** Types of Service: {CHL AMB TYPE OF SERVICE:[phone]}  I connected with Heidi Martin and/or Heidi Martin's {family members:20773} via  Telephone or Engineer, civil (consulting)  (Video is Caregility application) and verified that I am speaking with the correct person using two identifiers. Discussed confidentiality: {YES/NO:21197}  I discussed the limitations of telemedicine and the availability of in person appointments.  Discussed there is a possibility of technology failure and discussed alternative modes of communication if that failure occurs.  I discussed that engaging in this telemedicine visit, they consent to the provision of behavioral healthcare and the services will be billed under their insurance.  Patient and/or legal guardian expressed understanding and consented to Telemedicine visit: {YES/NO:21197}  Presenting Concerns: Patient and/or family reports the following symptoms/concerns: *** Duration of problem: ***; Severity of problem: {Mild/Moderate/Severe:20260}  Patient and/or Family's Strengths/Protective Factors: {CHL AMB BH PROTECTIVE [phone]}  Goals Addressed: Patient will:  Reduce symptoms of: {IBH Symptoms:21014056}   Increase knowledge and/or ability of: {IBH Patient Tools:21014057}   Demonstrate ability to: {IBH Goals:21014053}  Progress towards Goals: {CHL AMB BH PROGRESS TOWARDS GOALS:[phone]}  Interventions: Interventions utilized:  {IBH Interventions:21014054} Standardized Assessments completed: {IBH Screening Tools:21014051}  Patient and/or Family  Response: ***  Assessment: Patient currently experiencing ***.   Patient may benefit from ***.  Plan: Follow up with behavioral health clinician on : *** Behavioral recommendations: *** Referral(s): {IBH Referrals:21014055}  I discussed the assessment and treatment plan with the patient and/or parent/guardian. They were provided an opportunity to ask questions and all were answered. They agreed with the plan and demonstrated an understanding of the instructions.   They were advised to call back or seek an in-person evaluation if the symptoms worsen or if the condition fails to improve as anticipated.  Valetta Close Annalese Stiner, LCSW

## 2023-01-18 ENCOUNTER — Ambulatory Visit: Payer: Medicaid Other | Admitting: Clinical

## 2023-01-18 DIAGNOSIS — Z91199 Patient's noncompliance with other medical treatment and regimen due to unspecified reason: Secondary | ICD-10-CM

## 2023-01-29 ENCOUNTER — Encounter (HOSPITAL_COMMUNITY): Payer: Self-pay

## 2023-01-29 ENCOUNTER — Emergency Department (HOSPITAL_COMMUNITY)
Admission: EM | Admit: 2023-01-29 | Discharge: 2023-01-29 | Discharge status: Against Medical Advice | Payer: Medicaid Other | Attending: Emergency Medicine | Admitting: Emergency Medicine

## 2023-01-29 ENCOUNTER — Other Ambulatory Visit: Payer: Self-pay

## 2023-01-29 DIAGNOSIS — Z5321 Procedure and treatment not carried out due to patient leaving prior to being seen by health care provider: Secondary | ICD-10-CM | POA: Insufficient documentation

## 2023-01-29 DIAGNOSIS — G43909 Migraine, unspecified, not intractable, without status migrainosus: Secondary | ICD-10-CM | POA: Insufficient documentation

## 2023-01-29 NOTE — ED Triage Notes (Signed)
Pt c/o "migraine" for 2 weeks. Pt reports nothing is helping with it.  Pt reports she started having "floaters" today and shaking.

## 2023-01-30 ENCOUNTER — Ambulatory Visit: Payer: Medicaid Other | Admitting: Obstetrics and Gynecology

## 2023-01-30 ENCOUNTER — Encounter: Payer: Self-pay | Admitting: Obstetrics and Gynecology

## 2023-01-30 VITALS — BP 119/78 | HR 68 | Wt 185.2 lb

## 2023-01-30 DIAGNOSIS — N921 Excessive and frequent menstruation with irregular cycle: Secondary | ICD-10-CM | POA: Diagnosis not present

## 2023-01-30 DIAGNOSIS — G43719 Chronic migraine without aura, intractable, without status migrainosus: Secondary | ICD-10-CM | POA: Diagnosis not present

## 2023-01-30 MED ORDER — MEGESTROL ACETATE 40 MG PO TABS
40.0000 mg | ORAL_TABLET | Freq: Two times a day (BID) | ORAL | 5 refills | Status: DC
Start: 2023-01-30 — End: 2023-11-14

## 2023-01-30 MED ORDER — PROCHLORPERAZINE MALEATE 5 MG PO TABS
5.0000 mg | ORAL_TABLET | Freq: Four times a day (QID) | ORAL | 0 refills | Status: DC | PRN
Start: 2023-01-30 — End: 2023-03-19

## 2023-01-30 NOTE — Progress Notes (Signed)
GYNECOLOGY VISIT  Patient name: Heidi Martin MRN [id number]  Date of birth: 02-Nov-2000 Chief Complaint:   abnormal bleeding   History:  Heidi Martin is a 22 y.o. [id number] being seen today for prolonged bleeding.  Started getting dizzy yeterday and shakes yesterday along with prolonged migraine. No aura. Had migraines when younger. Has tried OTC medication without relief. Primary care at [facility] and was told referral placed but hadn't heard anything.   Bleeding since dleivery along with migrains and cramping. Pad or pad + tampon and changing every 2 hours. Has tried OTC analgesia for the pain without relief. Got depo after delivery Tried birth control pills -  Given birth controls after this pregnancy, about 3 weeks after delivering and it didn't help (given by [facility]) First time getting depo bled for a little bit but not as much as now. Didn't get another injection again afterwards, 2 years ago.   Past Medical History:  Diagnosis Date   Alpha thalassemia silent carrier    Chronic intractable headache 10/13/2014   COVID-19    Depression    Fatigue 05/31/2015   Irritable bowel syndrome (IBS)    Musculoskeletal pain 05/31/2015   PID (acute pelvic inflammatory disease)    Sickle cell trait (HCC)    Trichomonas infection    UTI (urinary tract infection)     Past Surgical History:  Procedure Laterality Date   DILATION AND EVACUATION N/A 10/27/2021   Procedure: DILATATION AND EVACUATION;  Surgeon: Dayton Bing, MD;  Location: [facility] OR;  Service: Gynecology;  Laterality: N/A;    The following portions of the patient's history were reviewed and updated as appropriate: allergies, current medications, past family history, past medical history, past social history, past surgical history and problem list.   Health Maintenance:   Last pap     Component Value Date/Time   DIAGPAP - Low grade squamous intraepithelial lesion (LSIL) (A) 06/12/2022 1000   ADEQPAP  06/12/2022  1000    Satisfactory for evaluation; transformation zone component PRESENT.    High Risk HPV: Positive  Adequacy:  Satisfactory for evaluation, transformation zone component PRESENT  Diagnosis:  Atypical squamous cells of undetermined significance (ASC-US)    Review of Systems:  Pertinent items are noted in HPI. Comprehensive review of systems was otherwise negative.   Objective:  Physical Exam BP 119/78   Pulse 68   Wt 185 lb 3.2 oz (84 kg)   Breastfeeding No   BMI 29.01 kg/m    Physical Exam Vitals and nursing note reviewed.  Constitutional:      Appearance: Normal appearance.  HENT:     Head: Normocephalic and atraumatic.  Pulmonary:     Effort: Pulmonary effort is normal.  Skin:    General: Skin is warm and dry.  Neurological:     General: No focal deficit present.     Mental Status: She is alert.  Psychiatric:        Mood and Affect: Mood normal.        Behavior: Behavior normal.        Thought Content: Thought content normal.        Judgment: Judgment normal.      Labs and Imaging US PELVIC COMPLETE WITH TRANSVAGINAL  Result Date: 01/11/2023 CLINICAL DATA:  Evaluate for retained products of conception. EXAM: TRANSABDOMINAL AND TRANSVAGINAL ULTRASOUND OF PELVIS TECHNIQUE: Both transabdominal and transvaginal ultrasound examinations of the pelvis were performed. Transabdominal technique was performed for global imaging of the pelvis including  uterus, ovaries, adnexal regions, and pelvic cul-de-sac. It was necessary to proceed with endovaginal exam following the transabdominal exam to visualize the endometrium. COMPARISON:  Ob ultrasound 05/01/2022 FINDINGS: Uterus Measurements: 11.2 x 4.1 x 8.8 cm = volume: 211 mL. No fibroids or other mass visualized. Endometrium Thickness: 3.7 mm.  No focal abnormality visualized. Right ovary Measurements: 2.4 x 1.1 x 1.2 cm = volume: 1.7 mL. Normal appearance/no adnexal mass. Left ovary Measurements: 3.7 x 1.3 x 1.4 cm =  volume: 3.6 mL. Normal appearance/no adnexal mass. Other findings Small amount of free fluid in the pelvis. IMPRESSION: 1. The endometrium is normal in thickness. 2. Small amount of free fluid in the pelvis. Electronically Signed   By: Annia Belt M.D.   On: 01/11/2023 10:10       Assessment & Plan:   1. Breakthrough bleeding Patient has abnormal uterine bleeding. May be due to prior depo + PP state in addition to trial of other medications since delivery. Will order abnormal uterine bleeding evaluation labs. Pelvic US wnl.  Will contact patient with these results and plans for further evaluation/management. Trial of megace to stop acute bleeding for now and then switch over to nuvaring once bleeding has subsided. Noted that ok to insert nuvaring while bleeding.  - megestrol (MEGACE) 40 MG tablet; Take 1 tablet (40 mg total) by mouth 2 (two) times daily. Can increase to two tablets twice a day in the event of heavy bleeding  Dispense: 60 tablet; Refill: 5 - CBC - TSH Rfx on Abnormal to Free T4 - Prolactin  2. Intractable chronic migraine without aura and without status migrainosus Referral to neuro and trial of compazine for migraine - Ambulatory referral to Neurology - prochlorperazine (COMPAZINE) 5 MG tablet; Take 1 tablet (5 mg total) by mouth every 6 (six) hours as needed (migraine).  Dispense: 30 tablet; Refill: 0   Routine preventative health maintenance measures emphasized.  Lorriane Shire, MD Minimally Invasive Gynecologic Surgery [facility], [facility]

## 2023-03-18 ENCOUNTER — Emergency Department (HOSPITAL_COMMUNITY)
Admission: EM | Admit: 2023-03-18 | Discharge: 2023-03-18 | Disposition: A | Discharge status: Home / Self Care | Payer: Medicaid Other | Attending: Emergency Medicine | Admitting: Emergency Medicine

## 2023-03-18 ENCOUNTER — Encounter (HOSPITAL_COMMUNITY): Payer: Self-pay | Admitting: Emergency Medicine

## 2023-03-18 ENCOUNTER — Other Ambulatory Visit: Payer: Self-pay

## 2023-03-18 ENCOUNTER — Emergency Department (HOSPITAL_COMMUNITY): Payer: Medicaid Other

## 2023-03-18 ENCOUNTER — Emergency Department (HOSPITAL_COMMUNITY)
Admission: EM | Admit: 2023-03-18 | Discharge: 2023-03-18 | Disposition: A | Discharge status: Home / Self Care | Payer: Medicaid Other | Source: Home / Self Care | Attending: Emergency Medicine | Admitting: Emergency Medicine

## 2023-03-18 DIAGNOSIS — R1110 Vomiting, unspecified: Secondary | ICD-10-CM | POA: Insufficient documentation

## 2023-03-18 DIAGNOSIS — R1012 Left upper quadrant pain: Secondary | ICD-10-CM | POA: Insufficient documentation

## 2023-03-18 DIAGNOSIS — R160 Hepatomegaly, not elsewhere classified: Secondary | ICD-10-CM | POA: Insufficient documentation

## 2023-03-18 DIAGNOSIS — R1011 Right upper quadrant pain: Secondary | ICD-10-CM | POA: Diagnosis present

## 2023-03-18 DIAGNOSIS — Z7982 Long term (current) use of aspirin: Secondary | ICD-10-CM | POA: Insufficient documentation

## 2023-03-18 DIAGNOSIS — R1033 Periumbilical pain: Secondary | ICD-10-CM | POA: Insufficient documentation

## 2023-03-18 DIAGNOSIS — R079 Chest pain, unspecified: Secondary | ICD-10-CM

## 2023-03-18 LAB — CBC WITH DIFFERENTIAL/PLATELET
Abs Immature Granulocytes: 0.01 10*3/uL (ref 0.00–0.07)
Basophils Absolute: 0 10*3/uL (ref 0.0–0.1)
Basophils Relative: 1 %
Eosinophils Absolute: 0.1 10*3/uL (ref 0.0–0.5)
Eosinophils Relative: 1 %
HCT: 34.3 % — ABNORMAL LOW (ref 36.0–46.0)
Hemoglobin: 10.8 g/dL — ABNORMAL LOW (ref 12.0–15.0)
Immature Granulocytes: 0 %
Lymphocytes Relative: 32 %
Lymphs Abs: 1.8 10*3/uL (ref 0.7–4.0)
MCH: 23.1 pg — ABNORMAL LOW (ref 26.0–34.0)
MCHC: 31.5 g/dL (ref 30.0–36.0)
MCV: 73.3 fL — ABNORMAL LOW (ref 80.0–100.0)
Monocytes Absolute: 0.6 10*3/uL (ref 0.1–1.0)
Monocytes Relative: 11 %
Neutro Abs: 3.1 10*3/uL (ref 1.7–7.7)
Neutrophils Relative %: 55 %
Platelets: 276 10*3/uL (ref 150–400)
RBC: 4.68 MIL/uL (ref 3.87–5.11)
RDW: 16.8 % — ABNORMAL HIGH (ref 11.5–15.5)
WBC: 5.7 10*3/uL (ref 4.0–10.5)
nRBC: 0 % (ref 0.0–0.2)

## 2023-03-18 LAB — COMPREHENSIVE METABOLIC PANEL
ALT: 19 U/L (ref 0–44)
ALT: 20 U/L (ref 0–44)
AST: 19 U/L (ref 15–41)
AST: 26 U/L (ref 15–41)
Albumin: 3.7 g/dL (ref 3.5–5.0)
Albumin: 3.6 g/dL (ref 3.5–5.0)
Alkaline Phosphatase: 71 U/L (ref 38–126)
Alkaline Phosphatase: 76 U/L (ref 38–126)
Anion gap: 9 (ref 5–15)
Anion gap: 9 (ref 5–15)
BUN: 8 mg/dL (ref 6–20)
BUN: <5 mg/dL — ABNORMAL LOW (ref 6–20)
CO2: 20 mmol/L — ABNORMAL LOW (ref 22–32)
CO2: 20 mmol/L — ABNORMAL LOW (ref 22–32)
Calcium: 8.6 mg/dL — ABNORMAL LOW (ref 8.9–10.3)
Calcium: 8.3 mg/dL — ABNORMAL LOW (ref 8.9–10.3)
Chloride: 107 mmol/L (ref 98–111)
Chloride: 106 mmol/L (ref 98–111)
Creatinine, Ser: 0.73 mg/dL (ref 0.44–1.00)
Creatinine, Ser: 0.81 mg/dL (ref 0.44–1.00)
GFR, Estimated: >60 mL/min (ref >60)
GFR, Estimated: >60 mL/min (ref >60)
Glucose, Bld: 91 mg/dL (ref 70–99)
Glucose, Bld: 90 mg/dL (ref 70–99)
Potassium: 3.5 mmol/L (ref 3.5–5.1)
Potassium: 4.1 mmol/L (ref 3.5–5.1)
Sodium: 136 mmol/L (ref 135–145)
Sodium: 135 mmol/L (ref 135–145)
Total Bilirubin: 0.5 mg/dL (ref 0.3–1.2)
Total Bilirubin: 0.5 mg/dL (ref 0.3–1.2)
Total Protein: 6.8 g/dL (ref 6.5–8.1)
Total Protein: 6.3 g/dL — ABNORMAL LOW (ref 6.5–8.1)

## 2023-03-18 LAB — URINALYSIS, ROUTINE W REFLEX MICROSCOPIC
Bilirubin Urine: NEGATIVE
Glucose, UA: NEGATIVE mg/dL
Hgb urine dipstick: NEGATIVE
Ketones, ur: NEGATIVE mg/dL
Leukocytes,Ua: NEGATIVE
Nitrite: NEGATIVE
Protein, ur: NEGATIVE mg/dL
Specific Gravity, Urine: 1.016 (ref 1.005–1.030)
pH: 6 (ref 5.0–8.0)

## 2023-03-18 LAB — TROPONIN I (HIGH SENSITIVITY)
Troponin I (High Sensitivity): <2 ng/L (ref <18)
Troponin I (High Sensitivity): <2 ng/L (ref <18)

## 2023-03-18 LAB — LIPASE, BLOOD: Lipase: 29 U/L (ref 11–51)

## 2023-03-18 LAB — CBC
HCT: 34.8 % — ABNORMAL LOW (ref 36.0–46.0)
Hemoglobin: 10.6 g/dL — ABNORMAL LOW (ref 12.0–15.0)
MCH: 23.1 pg — ABNORMAL LOW (ref 26.0–34.0)
MCHC: 30.5 g/dL (ref 30.0–36.0)
MCV: 75.8 fL — ABNORMAL LOW (ref 80.0–100.0)
Platelets: 291 10*3/uL (ref 150–400)
RBC: 4.59 MIL/uL (ref 3.87–5.11)
RDW: 16.8 % — ABNORMAL HIGH (ref 11.5–15.5)
WBC: 7.2 10*3/uL (ref 4.0–10.5)
nRBC: 0 % (ref 0.0–0.2)

## 2023-03-18 LAB — RETICULOCYTES
Immature Retic Fract: 9.3 % (ref 2.3–15.9)
RBC.: 4.67 MIL/uL (ref 3.87–5.11)
Retic Count, Absolute: 45.3 10*3/uL (ref 19.0–186.0)
Retic Ct Pct: 1 % (ref 0.4–3.1)

## 2023-03-18 LAB — HCG, SERUM, QUALITATIVE
Preg, Serum: NEGATIVE
Preg, Serum: NEGATIVE

## 2023-03-18 MED ORDER — METOCLOPRAMIDE HCL 5 MG/ML IJ SOLN
10.0000 mg | Freq: Once | INTRAMUSCULAR | Status: AC
  Administered 2023-03-18: 10 mg via INTRAVENOUS
  Filled 2023-03-18: qty 2

## 2023-03-18 MED ORDER — IOHEXOL 300 MG/ML  SOLN
100.0000 mL | Freq: Once | INTRAMUSCULAR | Status: AC | PRN
  Administered 2023-03-18: 100 mL via INTRAVENOUS

## 2023-03-18 MED ORDER — ONDANSETRON HCL 4 MG/2ML IJ SOLN
4.0000 mg | Freq: Once | INTRAMUSCULAR | Status: AC
  Administered 2023-03-18: 4 mg via INTRAVENOUS
  Filled 2023-03-18: qty 2

## 2023-03-18 MED ORDER — DIPHENHYDRAMINE HCL 50 MG/ML IJ SOLN
50.0000 mg | Freq: Once | INTRAMUSCULAR | Status: AC
  Administered 2023-03-18: 50 mg via INTRAVENOUS
  Filled 2023-03-18: qty 1

## 2023-03-18 MED ORDER — SODIUM CHLORIDE 0.9 % IV BOLUS
1000.0000 mL | Freq: Once | INTRAVENOUS | Status: AC
  Administered 2023-03-18: 1000 mL via INTRAVENOUS

## 2023-03-18 MED ORDER — FENTANYL CITRATE PF 50 MCG/ML IJ SOSY
50.0000 ug | PREFILLED_SYRINGE | Freq: Once | INTRAMUSCULAR | Status: AC
  Administered 2023-03-18: 50 ug via INTRAVENOUS
  Filled 2023-03-18: qty 1

## 2023-03-18 MED ORDER — OXYCODONE-ACETAMINOPHEN 5-325 MG PO TABS: 1.0000 | ORAL_TABLET | Freq: Four times a day (QID) | ORAL | 0 refills | Status: DC | PRN

## 2023-03-18 MED ORDER — KETOROLAC TROMETHAMINE 30 MG/ML IJ SOLN
30.0000 mg | Freq: Once | INTRAMUSCULAR | Status: AC
  Administered 2023-03-18: 30 mg via INTRAVENOUS
  Filled 2023-03-18: qty 1

## 2023-03-18 MED ORDER — ONDANSETRON 4 MG PO TBDP: 4.0000 mg | ORAL_TABLET | Freq: Three times a day (TID) | ORAL | 0 refills | Status: DC | PRN

## 2023-03-18 MED ORDER — HYDROMORPHONE HCL 1 MG/ML IJ SOLN
1.0000 mg | Freq: Once | INTRAMUSCULAR | Status: AC
  Administered 2023-03-18: 1 mg via INTRAVENOUS
  Filled 2023-03-18: qty 1

## 2023-03-18 NOTE — Discharge Instructions (Signed)
As we discussed, you have a liver mass that is likely causing your pain.  I recommend you follow-up with your primary care doctor and get an MRI and potentially arrange for a biopsy  In the meantime, I recommend you take Percocet as needed for pain  Return to ER if you have severe abdominal pain or vomiting or chest pain

## 2023-03-18 NOTE — ED Triage Notes (Signed)
Pt reports abdominal pain and nausea x 3 days. Pt states she was seen at [facility] for same this morning but reports she is not able to control the pain at home. Pt reports the pain radiates to the right chest.

## 2023-03-18 NOTE — ED Triage Notes (Signed)
Patient coming to ED for evaluation of abdominal pain x 3 days.  Reports she has had nausea and vomiting.  Hx of IBS.  C/o constipation.  No fevers.  LMP was "heavier than normal."

## 2023-03-18 NOTE — ED Provider Notes (Signed)
[place] EMERGENCY DEPARTMENT AT [facility] Provider Note   CSN: [id number] Arrival date & time: 03/18/23  1440     History  Chief Complaint  Patient presents with   Abdominal Pain    Heidi Martin is a 22 y.o. female history of sickle cell, here presenting with abdominal pain and nausea and chest pain.  Patient went to [facility] yesterday.  She had extensive workup and a CT abdomen pelvis that showed 4 cm liver mass or hemangioma.  Patient states that she went home and she took some Tylenol and she has worsening right upper quadrant pain and also right-sided chest pain.  Patient denies any history of acute chest.  Denies any fevers.  Patient states that she was not prescribed any pain medicine.  The history is provided by the patient.       Home Medications Prior to Admission medications   Medication Sig Start Date End Date Taking? Authorizing Provider  aspirin EC 325 MG tablet Take 325 mg by mouth daily as needed for mild pain (or headaches).    [provider]  ESTARYLLA 0.25-35 MG-MCG tablet Take 1 tablet by mouth daily. 03/15/23   [provider]  etonogestrel-ethinyl estradiol (NUVARING) 0.12-0.015 MG/24HR vaginal ring Insert vaginally and leave in place for 3 consecutive weeks, then remove for 1 week. Patient not taking: Reported on 03/18/2023 01/09/23   Milas Hock, MD  ferrous sulfate 325 (65 FE) MG tablet Take 1 tablet (325 mg total) by mouth every other day. 12/05/22   Brand Males, CNM  ibuprofen (ADVIL) 600 MG tablet Take 1 tablet (600 mg total) by mouth every 6 (six) hours. Patient not taking: Reported on 03/18/2023 12/04/22   Brand Males, CNM  megestrol (MEGACE) 40 MG tablet Take 1 tablet (40 mg total) by mouth 2 (two) times daily. Can increase to two tablets twice a day in the event of heavy bleeding Patient not taking: Reported on 03/18/2023 01/30/23   Lorriane Shire, MD  ondansetron (ZOFRAN-ODT) 4 MG  disintegrating tablet Take 1 tablet (4 mg total) by mouth every 8 (eight) hours as needed for nausea or vomiting. 03/18/23   Judithann Sheen, PA  Prenatal Vit-Min-FA-Fish Oil ([employer] PRENATAL GUMMY) 0.4-113.5 MG CHEW Chew 1 each by mouth daily after breakfast. Patient not taking: Reported on 03/18/2023 11/25/22   Bernerd Limbo, CNM  prochlorperazine (COMPAZINE) 5 MG tablet Take 1 tablet (5 mg total) by mouth every 6 (six) hours as needed (migraine). Patient not taking: Reported on 03/18/2023 01/30/23   Lorriane Shire, MD  TYLENOL 325 MG CAPS Take 325-650 mg by mouth every 6 (six) hours as needed (for pain or headaches).    [provider]  dicyclomine (BENTYL) 20 MG tablet Take 1 tablet (20 mg total) by mouth 2 (two) times daily. Patient not taking: No sig reported 11/14/19 02/14/20  Renne Crigler, PA-C      Allergies    Milk [milk (cow)]    Review of Systems   Review of Systems  Gastrointestinal:  Positive for abdominal pain.  All other systems reviewed and are negative.   Physical Exam Updated Vital Signs BP 125/82 (BP Location: Left Arm)   Pulse (!) 57   Temp 98 F (36.7 C) (Oral)   Resp 18   LMP  (LMP Unknown)   SpO2 100%   Breastfeeding No  Physical Exam Vitals and nursing note reviewed.  Constitutional:      Comments: Slightly uncomfortable  HENT:  Head: Normocephalic.     Mouth/Throat:     Mouth: Mucous membranes are moist.     Pharynx: Oropharynx is clear.  Eyes:     Extraocular Movements: Extraocular movements intact.     Pupils: Pupils are equal, round, and reactive to light.  Cardiovascular:     Heart sounds: Normal heart sounds.  Pulmonary:     Effort: Pulmonary effort is normal.     Breath sounds: Normal breath sounds.  Abdominal:     General: Abdomen is flat.     Comments: Mild right upper quadrant tenderness and no rebound  Skin:    General: Skin is warm.     Capillary Refill: Capillary refill takes less than 2 seconds.  Neurological:      General: No focal deficit present.  Psychiatric:        Mood and Affect: Mood normal.     ED Results / Procedures / Treatments   Labs (all labs ordered are listed, but only abnormal results are displayed) Labs Reviewed  CBC WITH DIFFERENTIAL/PLATELET - Abnormal; Notable for the following components:      Result Value   Hemoglobin 10.8 (*)    HCT 34.3 (*)    MCV 73.3 (*)    MCH 23.1 (*)    RDW 16.8 (*)    All other components within normal limits  RETICULOCYTES  COMPREHENSIVE METABOLIC PANEL  HCG, SERUM, QUALITATIVE  TROPONIN I (HIGH SENSITIVITY)    EKG EKG Interpretation Date/Time:  Sunday March 18 2023 15:23:00 EDT Ventricular Rate:  62 PR Interval:  158 QRS Duration:  87 QT Interval:  428 QTC Calculation: 435 R Axis:   20  Text Interpretation: Sinus rhythm Confirmed by Anders Simmonds [phone]) on 03/18/2023 3:25:02 PM  Radiology DG Chest Port 1 View  Result Date: 03/18/2023 CLINICAL DATA:  Right-sided abdominal pain for several days. History also mentions chest pain. EXAM: PORTABLE CHEST 1 VIEW COMPARISON:  07/21/2021. FINDINGS: Normal heart, mediastinum and hila. Clear lungs.  No pleural effusion or pneumothorax. Skeletal structures are unremarkable. IMPRESSION: Normal frontal chest radiograph Electronically Signed   By: Amie Portland M.D.   On: 03/18/2023 15:38   US Abdomen Limited RUQ (LIVER/GB)  Result Date: 03/18/2023 CLINICAL DATA:  Right upper quadrant pain. EXAM: ULTRASOUND ABDOMEN LIMITED RIGHT UPPER QUADRANT COMPARISON:  Abdomen pelvis CT from earlier same day FINDINGS: Gallbladder: No gallstones or wall thickening visualized. No sonographic Murphy sign noted by sonographer. Common bile duct: Diameter: 2-3 mm Liver: Caudate lobe lesion identified on CT earlier today not well demonstrated by ultrasound. Portal vein is patent on color Doppler imaging with normal direction of blood flow towards the liver. Other: None. IMPRESSION: Unremarkable right upper quadrant  ultrasound. 4.1 cm caudate lobe lesion identified on CT earlier today not well demonstrated by ultrasound. Please see follow up recommendations in the report for that CT. Electronically Signed   By: Kennith Center M.D.   On: 03/18/2023 06:58   CT ABDOMEN PELVIS W CONTRAST  Result Date: 03/18/2023 CLINICAL DATA:  Right lower quadrant pain. Renal stone CT 02/14/2020 EXAM: CT ABDOMEN AND PELVIS WITH CONTRAST TECHNIQUE: Multidetector CT imaging of the abdomen and pelvis was performed using the standard protocol following bolus administration of intravenous contrast. RADIATION DOSE REDUCTION: This exam was performed according to the departmental dose-optimization program which includes automated exposure control, adjustment of the mA and/or kV according to patient size and/or use of iterative reconstruction technique. CONTRAST:  OMNIPAQUE IOHEXOL 300 MG/ML  SOLN COMPARISON:  02/14/2020  FINDINGS: Lower chest:  No contributory findings. Hepatobiliary: Nearly 4 cm mildly dense mass at the caudate lobe, not seen on prior.No evidence of biliary obstruction or stone. Pancreas: Unremarkable. Spleen: Unremarkable. Adrenals/Urinary Tract: Negative adrenals. No hydronephrosis or stone. Unremarkable bladder. Stomach/Bowel:  No obstruction. No appendicitis. Vascular/Lymphatic: No acute vascular abnormality. No mass or adenopathy. Reproductive:No pathologic findings. Other: No ascites or pneumoperitoneum. Musculoskeletal: No acute abnormalities. IMPRESSION: 1. No appendicitis or other acute finding. 2. Nearly 4 cm liver mass at the caudate lobe which could be hemangioma, FNH, or adenoma. Suggest characterization with outpatient enhanced liver MRI. Electronically Signed   By: Tiburcio Pea M.D.   On: 03/18/2023 05:12    Procedures Procedures    Medications Ordered in ED Medications  sodium chloride 0.9 % bolus 1,000 mL (1,000 mLs Intravenous New Bag/Given 03/18/23 1618)  ondansetron (ZOFRAN) injection 4 mg (4 mg  Intravenous Given 03/18/23 1618)  HYDROmorphone (DILAUDID) injection 1 mg (1 mg Intravenous Given 03/18/23 1618)    ED Course/ Medical Decision Making/ A&P                                 Medical Decision Making KENZLEI KEMNITZ is a 22 y.o. female here presenting with right upper quadrant abdominal pain.  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  Patient does have pain to the right chest now but I think that is likely radiation from the abdominal pain.  She does have a history of sickle cell so we will get a chest x-ray to rule out acute chest.  Also consider sickle cell pain crisis.  Plan to get CBC and CMP and reticulocyte count and troponin x 1 and chest x-ray.  Will give pain medicine and reassess.  7:18 PM I reviewed patient's labs and troponin is negative x 2.  CMP is normal.  Patient's pain is under control.  Reticulocyte count is normal.  I think her pain is likely secondary to her liver mass.  Patient was told to follow-up with PCP and get outpatient MRI for further evaluation.  Will discharge home with pain medicine as needed.  Problems Addressed: Liver mass: acute illness or injury  Amount and/or Complexity of Data Reviewed Labs: ordered. Decision-making details documented in ED Course. Radiology: ordered and independent interpretation performed. Decision-making details documented in ED Course. ECG/medicine tests: ordered and independent interpretation performed. Decision-making details documented in ED Course.  Risk Prescription drug management.    Final Clinical Impression(s) / ED Diagnoses Final diagnoses:  None    Rx / DC Orders ED Discharge Orders     None         Charlynne Pander, MD 03/18/23 1921

## 2023-03-18 NOTE — ED Notes (Signed)
Pt given sandwich bag and sprite

## 2023-03-18 NOTE — Discharge Instructions (Addendum)
Thank you for letting us take care of you today   lab work was negative for acute infection, pregnancy, UTI.   Please follow-up with PCP regarding CT impression and recommendation for outpatient liver MRI  "4 cm liver mass at the caudate lobe which could be hemangioma, FNH, adenoma and recommend outpatient liver MRI"  Please take Tylenol as needed for pain.  Sent Zofran to your pharmacy for nausea as needed. Sent naproxen for pain as needed  Return Emergency Department for significant worsening symptoms, fever greater than 100.4 is not relieved with Tylenol, intractable vomiting

## 2023-03-18 NOTE — ED Provider Notes (Signed)
[place] EMERGENCY DEPARTMENT AT [facility] Provider Note   CSN: [id number] Arrival date & time: 03/18/23  0208     History  Chief Complaint  Patient presents with   Abdominal Pain    Heidi Martin is a 22 y.o. female with past medical history of sickle cell trait presents emergency department for evaluation of RUQ and LUQ abdominal pain that has been occurring for the past 3 days.  Reports that she had 1 episode of vomiting earlier last evening.  She denies shortness of breath, chest pain, fever.   Abdominal Pain Associated symptoms: no chest pain, no chills, no constipation, no cough, no diarrhea, no dysuria, no fatigue, no fever, no hematuria, no nausea, no shortness of breath, no vaginal bleeding, no vaginal discharge and no vomiting        Home Medications Prior to Admission medications   Medication Sig Start Date End Date Taking? Authorizing Provider  aspirin EC 325 MG tablet Take 325 mg by mouth daily as needed for mild pain (or headaches).   Yes [provider]  ESTARYLLA 0.25-35 MG-MCG tablet Take 1 tablet by mouth daily. 03/15/23  Yes [provider]  ferrous sulfate 325 (65 FE) MG tablet Take 1 tablet (325 mg total) by mouth every other day. 12/05/22  Yes Simpson, Danielle L, CNM  TYLENOL 325 MG CAPS Take 325-650 mg by mouth every 6 (six) hours as needed (for pain or headaches).   Yes [provider]  etonogestrel-ethinyl estradiol (NUVARING) 0.12-0.015 MG/24HR vaginal ring Insert vaginally and leave in place for 3 consecutive weeks, then remove for 1 week. Patient not taking: Reported on 03/18/2023 01/09/23   Milas Hock, MD  ibuprofen (ADVIL) 600 MG tablet Take 1 tablet (600 mg total) by mouth every 6 (six) hours. Patient not taking: Reported on 03/18/2023 12/04/22   Brand Males, CNM  megestrol (MEGACE) 40 MG tablet Take 1 tablet (40 mg total) by mouth 2 (two) times daily. Can increase to two tablets twice a day in the event  of heavy bleeding Patient not taking: Reported on 03/18/2023 01/30/23   Lorriane Shire, MD  ondansetron (ZOFRAN-ODT) 4 MG disintegrating tablet Take 1 tablet (4 mg total) by mouth every 8 (eight) hours as needed for nausea or vomiting. 03/18/23  Yes Judithann Sheen, PA  Prenatal Vit-Min-FA-Fish Oil ([employer] PRENATAL GUMMY) 0.4-113.5 MG CHEW Chew 1 each by mouth daily after breakfast. Patient not taking: Reported on 03/18/2023 11/25/22   Bernerd Limbo, CNM  prochlorperazine (COMPAZINE) 5 MG tablet Take 1 tablet (5 mg total) by mouth every 6 (six) hours as needed (migraine). Patient not taking: Reported on 03/18/2023 01/30/23   Lorriane Shire, MD  dicyclomine (BENTYL) 20 MG tablet Take 1 tablet (20 mg total) by mouth 2 (two) times daily. Patient not taking: No sig reported 11/14/19 02/14/20  Renne Crigler, PA-C      Allergies    Milk [milk (cow)]    Review of Systems   Review of Systems  Constitutional:  Negative for chills, fatigue and fever.  Respiratory:  Negative for cough, chest tightness, shortness of breath and wheezing.   Cardiovascular:  Negative for chest pain and palpitations.  Gastrointestinal:  Positive for abdominal pain. Negative for blood in stool, constipation, diarrhea, nausea and vomiting.  Genitourinary:  Negative for dysuria, hematuria, pelvic pain, vaginal bleeding and vaginal discharge.  Neurological:  Negative for dizziness, seizures, weakness, light-headedness, numbness and headaches.    Physical Exam Updated Vital Signs BP 106/87  Pulse 74   Temp 98.2 F (36.8 C) (Oral)   Resp 16   Ht 5\' 7"  (1.702 m)   Wt 83.5 kg   SpO2 96%   BMI 28.82 kg/m  Physical Exam Vitals and nursing note reviewed.  Constitutional:      General: She is not in acute distress.    Appearance: Normal appearance.  HENT:     Head: Normocephalic and atraumatic.  Eyes:     General: No scleral icterus.    Conjunctiva/sclera: Conjunctivae normal.  Cardiovascular:     Rate and  Rhythm: Normal rate.     Pulses: Normal pulses.  Pulmonary:     Effort: Pulmonary effort is normal. No respiratory distress.     Breath sounds: Normal breath sounds. No wheezing.  Abdominal:     General: There is no distension.     Palpations: Abdomen is soft.     Tenderness: There is abdominal tenderness in the right upper quadrant, periumbilical area and left upper quadrant. There is no right CVA tenderness, left CVA tenderness or rebound.  Musculoskeletal:        General: Normal range of motion.  Skin:    General: Skin is warm.     Capillary Refill: Capillary refill takes less than 2 seconds.     Coloration: Skin is not jaundiced or pale.  Neurological:     Mental Status: She is alert. Mental status is at baseline.     ED Results / Procedures / Treatments   Labs (all labs ordered are listed, but only abnormal results are displayed) Labs Reviewed  COMPREHENSIVE METABOLIC PANEL - Abnormal; Notable for the following components:      Result Value   CO2 20 (*)    Calcium 8.6 (*)    All other components within normal limits  CBC - Abnormal; Notable for the following components:   Hemoglobin 10.6 (*)    HCT 34.8 (*)    MCV 75.8 (*)    MCH 23.1 (*)    RDW 16.8 (*)    All other components within normal limits  URINALYSIS, ROUTINE W REFLEX MICROSCOPIC - Abnormal; Notable for the following components:   APPearance HAZY (*)    All other components within normal limits  LIPASE, BLOOD  HCG, SERUM, QUALITATIVE    EKG None  Radiology US Abdomen Limited RUQ (LIVER/GB)  Result Date: 03/18/2023 CLINICAL DATA:  Right upper quadrant pain. EXAM: ULTRASOUND ABDOMEN LIMITED RIGHT UPPER QUADRANT COMPARISON:  Abdomen pelvis CT from earlier same day FINDINGS: Gallbladder: No gallstones or wall thickening visualized. No sonographic Murphy sign noted by sonographer. Common bile duct: Diameter: 2-3 mm Liver: Caudate lobe lesion identified on CT earlier today not well demonstrated by ultrasound.  Portal vein is patent on color Doppler imaging with normal direction of blood flow towards the liver. Other: None. IMPRESSION: Unremarkable right upper quadrant ultrasound. 4.1 cm caudate lobe lesion identified on CT earlier today not well demonstrated by ultrasound. Please see follow up recommendations in the report for that CT. Electronically Signed   By: Kennith Center M.D.   On: 03/18/2023 06:58   CT ABDOMEN PELVIS W CONTRAST  Result Date: 03/18/2023 CLINICAL DATA:  Right lower quadrant pain. Renal stone CT 02/14/2020 EXAM: CT ABDOMEN AND PELVIS WITH CONTRAST TECHNIQUE: Multidetector CT imaging of the abdomen and pelvis was performed using the standard protocol following bolus administration of intravenous contrast. RADIATION DOSE REDUCTION: This exam was performed according to the departmental dose-optimization program which includes automated exposure control, adjustment  of the mA and/or kV according to patient size and/or use of iterative reconstruction technique. CONTRAST:  OMNIPAQUE IOHEXOL 300 MG/ML  SOLN COMPARISON:  02/14/2020 FINDINGS: Lower chest:  No contributory findings. Hepatobiliary: Nearly 4 cm mildly dense mass at the caudate lobe, not seen on prior.No evidence of biliary obstruction or stone. Pancreas: Unremarkable. Spleen: Unremarkable. Adrenals/Urinary Tract: Negative adrenals. No hydronephrosis or stone. Unremarkable bladder. Stomach/Bowel:  No obstruction. No appendicitis. Vascular/Lymphatic: No acute vascular abnormality. No mass or adenopathy. Reproductive:No pathologic findings. Other: No ascites or pneumoperitoneum. Musculoskeletal: No acute abnormalities. IMPRESSION: 1. No appendicitis or other acute finding. 2. Nearly 4 cm liver mass at the caudate lobe which could be hemangioma, FNH, or adenoma. Suggest characterization with outpatient enhanced liver MRI. Electronically Signed   By: Tiburcio Pea M.D.   On: 03/18/2023 05:12    Procedures Procedures    Medications  Ordered in ED Medications  ondansetron (ZOFRAN) injection 4 mg (4 mg Intravenous Given 03/18/23 0340)  fentaNYL (SUBLIMAZE) injection 50 mcg (50 mcg Intravenous Given 03/18/23 0340)  iohexol (OMNIPAQUE) 300 MG/ML solution 100 mL (100 mLs Intravenous Contrast Given 03/18/23 0432)  fentaNYL (SUBLIMAZE) injection 50 mcg (50 mcg Intravenous Given 03/18/23 0507)  ondansetron (ZOFRAN) injection 4 mg (4 mg Intravenous Given 03/18/23 6295)    ED Course/ Medical Decision Making/ A&P                                 Medical Decision Making Amount and/or Complexity of Data Reviewed Labs: ordered.   Upon evaluation, patient resting in bed.  She is significantly tender to the right upper quadrant and left upper quadrant.  She reports that the pain started in the right upper quadrant pain and radiates to the periumbilical area.  She had 1 episode of vomiting last night.  Fentanyl given for pain and Zofran given for nausea.  Physical exam concerning for intra-abdominal pathology due to significant RUQ tenderness.    No leukocytosis, anemia (10.6) as per patient's baseline.  hCG negative.  UA negative for acute infection.  Lipase negative.  CT impression is thickened for a 4 cm liver mass at the caudate lobe which could be hemangioma, FNH, adenoma and recommend outpatient liver MRI.  Ultrasound of right upper quadrant is significant for same findings as CT.  Patient is stable for discharge.  Discussion is had regarding following up with primary care provider regarding CT impression.  Symptomatic management to include Tylenol as needed for pain.  Return precautions to emergency department to include but not limited to chest pain, shortness of breath, fever greater than 101.32-year-old with Tylenol, and intractable vomiting, significant worsening of symptoms.          Final Clinical Impression(s) / ED Diagnoses Final diagnoses:  Right upper quadrant abdominal pain    Rx / DC Orders ED Discharge Orders           Ordered    ondansetron (ZOFRAN-ODT) 4 MG disintegrating tablet  Every 8 hours PRN        03/18/23 0708              Judithann Sheen, PA 03/18/23 0710    Glynn Octave, MD 03/18/23 [id number]

## 2023-03-19 ENCOUNTER — Emergency Department (HOSPITAL_COMMUNITY)
Admission: EM | Admit: 2023-03-19 | Discharge: 2023-03-19 | Disposition: A | Discharge status: Home / Self Care | Payer: Medicaid Other | Attending: Emergency Medicine | Admitting: Emergency Medicine

## 2023-03-19 ENCOUNTER — Other Ambulatory Visit: Payer: Self-pay

## 2023-03-19 ENCOUNTER — Emergency Department (HOSPITAL_COMMUNITY): Payer: Medicaid Other

## 2023-03-19 DIAGNOSIS — K7689 Other specified diseases of liver: Secondary | ICD-10-CM | POA: Insufficient documentation

## 2023-03-19 DIAGNOSIS — N83202 Unspecified ovarian cyst, left side: Secondary | ICD-10-CM | POA: Diagnosis not present

## 2023-03-19 DIAGNOSIS — R112 Nausea with vomiting, unspecified: Secondary | ICD-10-CM | POA: Insufficient documentation

## 2023-03-19 DIAGNOSIS — R1011 Right upper quadrant pain: Secondary | ICD-10-CM

## 2023-03-19 DIAGNOSIS — R519 Headache, unspecified: Secondary | ICD-10-CM | POA: Insufficient documentation

## 2023-03-19 DIAGNOSIS — Z7982 Long term (current) use of aspirin: Secondary | ICD-10-CM | POA: Insufficient documentation

## 2023-03-19 LAB — CBC
HCT: 36.6 % (ref 36.0–46.0)
Hemoglobin: 11.1 g/dL — ABNORMAL LOW (ref 12.0–15.0)
MCH: 22.9 pg — ABNORMAL LOW (ref 26.0–34.0)
MCHC: 30.3 g/dL (ref 30.0–36.0)
MCV: 75.5 fL — ABNORMAL LOW (ref 80.0–100.0)
Platelets: 265 10*3/uL (ref 150–400)
RBC: 4.85 MIL/uL (ref 3.87–5.11)
RDW: 16.8 % — ABNORMAL HIGH (ref 11.5–15.5)
WBC: 5.8 10*3/uL (ref 4.0–10.5)
nRBC: 0 % (ref 0.0–0.2)

## 2023-03-19 LAB — COMPREHENSIVE METABOLIC PANEL
ALT: 17 U/L (ref 0–44)
AST: 19 U/L (ref 15–41)
Albumin: 3.5 g/dL (ref 3.5–5.0)
Alkaline Phosphatase: 73 U/L (ref 38–126)
Anion gap: 10 (ref 5–15)
BUN: <5 mg/dL — ABNORMAL LOW (ref 6–20)
CO2: 20 mmol/L — ABNORMAL LOW (ref 22–32)
Calcium: 8.4 mg/dL — ABNORMAL LOW (ref 8.9–10.3)
Chloride: 106 mmol/L (ref 98–111)
Creatinine, Ser: 0.82 mg/dL (ref 0.44–1.00)
GFR, Estimated: >60 mL/min (ref >60)
Glucose, Bld: 88 mg/dL (ref 70–99)
Potassium: 4.2 mmol/L (ref 3.5–5.1)
Sodium: 136 mmol/L (ref 135–145)
Total Bilirubin: 0.4 mg/dL (ref 0.3–1.2)
Total Protein: 6.3 g/dL — ABNORMAL LOW (ref 6.5–8.1)

## 2023-03-19 LAB — URINALYSIS, ROUTINE W REFLEX MICROSCOPIC
Bilirubin Urine: NEGATIVE
Glucose, UA: NEGATIVE mg/dL
Hgb urine dipstick: NEGATIVE
Ketones, ur: 5 mg/dL — AB
Nitrite: NEGATIVE
Protein, ur: NEGATIVE mg/dL
Specific Gravity, Urine: 1.009 (ref 1.005–1.030)
pH: 5 (ref 5.0–8.0)

## 2023-03-19 LAB — LIPASE, BLOOD: Lipase: 24 U/L (ref 11–51)

## 2023-03-19 LAB — HCG, SERUM, QUALITATIVE: Preg, Serum: NEGATIVE

## 2023-03-19 MED ORDER — HYOSCYAMINE SULFATE 0.125 MG SL SUBL
0.1250 mg | SUBLINGUAL_TABLET | Freq: Four times a day (QID) | SUBLINGUAL | 1 refills | Status: AC | PRN
Start: 2023-03-19 — End: ?

## 2023-03-19 MED ORDER — PANTOPRAZOLE SODIUM 20 MG PO TBEC: 20.0000 mg | DELAYED_RELEASE_TABLET | Freq: Every day | ORAL | 1 refills | Status: DC

## 2023-03-19 MED ORDER — MORPHINE SULFATE (PF) 4 MG/ML IV SOLN
4.0000 mg | Freq: Once | INTRAVENOUS | Status: AC
  Administered 2023-03-19: 4 mg via INTRAVENOUS
  Filled 2023-03-19: qty 1

## 2023-03-19 MED ORDER — PROCHLORPERAZINE MALEATE 10 MG PO TABS
10.0000 mg | ORAL_TABLET | Freq: Two times a day (BID) | ORAL | 0 refills | Status: AC | PRN
Start: 2023-03-19 — End: ?

## 2023-03-19 MED ORDER — GADOBUTROL 1 MMOL/ML IV SOLN
7.5000 mL | Freq: Once | INTRAVENOUS | Status: AC | PRN
  Administered 2023-03-19: 7.5 mL via INTRAVENOUS

## 2023-03-19 MED ORDER — CELECOXIB 200 MG PO CAPS
200.0000 mg | ORAL_CAPSULE | Freq: Two times a day (BID) | ORAL | 0 refills | Status: AC
Start: 2023-03-19 — End: ?

## 2023-03-19 MED ORDER — DIAZEPAM 5 MG/ML IJ SOLN
2.5000 mg | Freq: Once | INTRAMUSCULAR | Status: AC
  Administered 2023-03-19: 2.5 mg via INTRAVENOUS
  Filled 2023-03-19: qty 2

## 2023-03-19 MED ORDER — SODIUM CHLORIDE 0.9 % IV BOLUS
1000.0000 mL | Freq: Once | INTRAVENOUS | Status: AC
  Administered 2023-03-19: 1000 mL via INTRAVENOUS

## 2023-03-19 MED ORDER — HYOSCYAMINE SULFATE 0.125 MG SL SUBL: 0.1250 mg | SUBLINGUAL_TABLET | Freq: Four times a day (QID) | SUBLINGUAL | 1 refills | Status: DC | PRN

## 2023-03-19 MED ORDER — DROPERIDOL 2.5 MG/ML IJ SOLN
1.2500 mg | Freq: Once | INTRAMUSCULAR | Status: AC
  Administered 2023-03-19: 1.25 mg via INTRAVENOUS
  Filled 2023-03-19: qty 2

## 2023-03-19 MED ORDER — ONDANSETRON HCL 4 MG/2ML IJ SOLN
4.0000 mg | Freq: Once | INTRAMUSCULAR | Status: AC
  Administered 2023-03-19: 4 mg via INTRAVENOUS
  Filled 2023-03-19: qty 2

## 2023-03-19 NOTE — Discharge Instructions (Addendum)
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  This should not be causing you significant pain however sometimes when they grow rapidly it can cause pain.  Considering your recent pregnancy this may be the case.  Please follow closely with [facility] gastroenterology for further evaluation.   Abdominal (belly) pain can be caused by many things. Your caregiver performed an examination and possibly ordered blood/urine tests and imaging (CT scan, x-rays, ultrasound). Many cases can be observed and treated at home after initial evaluation in the emergency department. Even though you are being discharged home, abdominal pain can be unpredictable. Therefore, you need a repeated exam if your pain does not resolve, returns, or worsens. Most patients with abdominal pain don't have to be admitted to the hospital or have surgery, but serious problems like appendicitis and gallbladder attacks can start out as nonspecific pain. Many abdominal conditions cannot be diagnosed in one visit, so follow-up evaluations are very important. SEEK IMMEDIATE MEDICAL ATTENTION IF: The pain does not go away or becomes severe.  A temperature above 101 develops.  Repeated vomiting occurs (multiple episodes).  The pain becomes localized to portions of the abdomen. The right side could possibly be appendicitis. In an adult, the left lower portion of the abdomen could be colitis or diverticulitis.  Blood is being passed in stools or vomit (bright red or black tarry stools).  Return also if you develop chest pain, difficulty breathing, dizziness or fainting, or become confused, poorly responsive, or inconsolable (young children).

## 2023-03-19 NOTE — Progress Notes (Signed)
  Evaluation after Contrast Extravasation  Patient seen and examined immediately after contrast extravasation while in MRI.  Exam: There is no swelling at the left forearm area.  There is no erythema. There is no discoloration. There are no blisters. There are no signs of decreased perfusion of the skin.  It is  warm to touch.  The patient has full ROM in fingers.  Radial pulse is normal.  Per contrast extravasation protocol, I have instructed the patient to keep an ice pack on the area for 20-60 minutes at a time for about 48 hours.   Keep arm elevated as much as possible.   The patient understands to call the radiology department if there is: - increase in pain or swelling - changed or altered sensation - ulceration or blistering - increasing redness - warmth or increasing firmness - decreased tissue perfusion as noted by decreased capillary refill or discoloration of skin - decreased pulses peripheral to site   Alene Mires PA-C 03/19/2023 3:19 PM

## 2023-03-19 NOTE — ED Notes (Signed)
Pt not in room to obtain vitals

## 2023-03-19 NOTE — ED Triage Notes (Signed)
Pt to ED POV from home. Pt c/o RUQ abd pain x4 days. Pt also endorses N/V. Pt denies diarrhea. Pt states she was seen here for same yesterday and was told she has a liver mass and to have out pt MRI. Pt was sent home with pain medicine script but states pain is still not under control.

## 2023-03-19 NOTE — ED Provider Notes (Signed)
Seen yesterday for RUQ pain. Found to have a liver mass. Returns today for severe pain/ nv. Currently awaiting MR Abdomen w/wo contrast.  Follow up on images   Arthor Captain, PA-C 03/19/23 1519    Gloris Manchester, MD 03/19/23 2353

## 2023-03-19 NOTE — ED Provider Notes (Signed)
[place] EMERGENCY DEPARTMENT AT [facility] Provider Note   CSN: [id number] Arrival date & time: 03/19/23  1113     History {Add pertinent medical, surgical, social history, OB history to HPI:1} No chief complaint on file.  HPI Heidi Martin is a 22 y.o. female with sickle cell trait and IBS presenting for abdominal pain.  Pain started 4 days ago is in the right upper quadrant.  Endorses nausea and vomiting.  Was seen for same yesterday at [facility].  CT scan at that time showed a liver mass.  She was discharged yesterday evening with plans to follow-up with PCP in schedule outpatient MRI.  She returns because abdominal pain is worse and she continues to have nausea and vomiting.  Denies diarrhea.  Normal bowel movement yesterday.  Output is nonbloody.  Denies urinary changes.  HPI     Home Medications Prior to Admission medications   Medication Sig Start Date End Date Taking? Authorizing Provider  aspirin EC 325 MG tablet Take 325 mg by mouth daily as needed for mild pain (or headaches).    [provider]  ESTARYLLA 0.25-35 MG-MCG tablet Take 1 tablet by mouth daily. 03/15/23   [provider]  etonogestrel-ethinyl estradiol (NUVARING) 0.12-0.015 MG/24HR vaginal ring Insert vaginally and leave in place for 3 consecutive weeks, then remove for 1 week. Patient not taking: Reported on 03/18/2023 01/09/23   Milas Hock, MD  ferrous sulfate 325 (65 FE) MG tablet Take 1 tablet (325 mg total) by mouth every other day. 12/05/22   Brand Males, CNM  ibuprofen (ADVIL) 600 MG tablet Take 1 tablet (600 mg total) by mouth every 6 (six) hours. Patient not taking: Reported on 03/18/2023 12/04/22   Brand Males, CNM  megestrol (MEGACE) 40 MG tablet Take 1 tablet (40 mg total) by mouth 2 (two) times daily. Can increase to two tablets twice a day in the event of heavy bleeding Patient not taking: Reported on 03/18/2023 01/30/23   Lorriane Shire, MD   ondansetron (ZOFRAN-ODT) 4 MG disintegrating tablet Take 1 tablet (4 mg total) by mouth every 8 (eight) hours as needed for nausea or vomiting. 03/18/23   Judithann Sheen, PA  oxyCODONE-acetaminophen (PERCOCET) 5-325 MG tablet Take 1 tablet by mouth every 6 (six) hours as needed. 03/18/23   Charlynne Pander, MD  Prenatal Vit-Min-FA-Fish Oil ([employer] PRENATAL GUMMY) 0.4-113.5 MG CHEW Chew 1 each by mouth daily after breakfast. Patient not taking: Reported on 03/18/2023 11/25/22   Bernerd Limbo, CNM  prochlorperazine (COMPAZINE) 5 MG tablet Take 1 tablet (5 mg total) by mouth every 6 (six) hours as needed (migraine). Patient not taking: Reported on 03/18/2023 01/30/23   Lorriane Shire, MD  TYLENOL 325 MG CAPS Take 325-650 mg by mouth every 6 (six) hours as needed (for pain or headaches).    [provider]  dicyclomine (BENTYL) 20 MG tablet Take 1 tablet (20 mg total) by mouth 2 (two) times daily. Patient not taking: No sig reported 11/14/19 02/14/20  Renne Crigler, PA-C      Allergies    Milk [milk (cow)]    Review of Systems   Review of Systems  Gastrointestinal:  Positive for abdominal pain.    Physical Exam Updated Vital Signs BP 119/86 (BP Location: Right Arm)   Pulse 65   Temp 98.2 F (36.8 C) (Oral)   Resp 13   LMP  (LMP Unknown)   SpO2 100%  Physical Exam Vitals and nursing note  reviewed.  HENT:     Head: Normocephalic and atraumatic.     Mouth/Throat:     Mouth: Mucous membranes are moist.  Eyes:     General:        Right eye: No discharge.        Left eye: No discharge.     Conjunctiva/sclera: Conjunctivae normal.  Cardiovascular:     Rate and Rhythm: Normal rate and regular rhythm.     Pulses: Normal pulses.     Heart sounds: Normal heart sounds.  Pulmonary:     Effort: Pulmonary effort is normal.     Breath sounds: Normal breath sounds.  Abdominal:     General: Abdomen is flat.     Palpations: Abdomen is soft.     Tenderness: There is abdominal  tenderness in the right upper quadrant.  Skin:    General: Skin is warm and dry.  Neurological:     General: No focal deficit present.  Psychiatric:        Mood and Affect: Mood normal.     ED Results / Procedures / Treatments   Labs (all labs ordered are listed, but only abnormal results are displayed) Labs Reviewed  CBC - Abnormal; Notable for the following components:      Result Value   Hemoglobin 11.1 (*)    MCV 75.5 (*)    MCH 22.9 (*)    RDW 16.8 (*)    All other components within normal limits  HCG, SERUM, QUALITATIVE  LIPASE, BLOOD  COMPREHENSIVE METABOLIC PANEL  URINALYSIS, ROUTINE W REFLEX MICROSCOPIC    EKG None  Radiology DG Chest Port 1 View  Result Date: 03/18/2023 CLINICAL DATA:  Right-sided abdominal pain for several days. History also mentions chest pain. EXAM: PORTABLE CHEST 1 VIEW COMPARISON:  07/21/2021. FINDINGS: Normal heart, mediastinum and hila. Clear lungs.  No pleural effusion or pneumothorax. Skeletal structures are unremarkable. IMPRESSION: Normal frontal chest radiograph Electronically Signed   By: Amie Portland M.D.   On: 03/18/2023 15:38   US Abdomen Limited RUQ (LIVER/GB)  Result Date: 03/18/2023 CLINICAL DATA:  Right upper quadrant pain. EXAM: ULTRASOUND ABDOMEN LIMITED RIGHT UPPER QUADRANT COMPARISON:  Abdomen pelvis CT from earlier same day FINDINGS: Gallbladder: No gallstones or wall thickening visualized. No sonographic Murphy sign noted by sonographer. Common bile duct: Diameter: 2-3 mm Liver: Caudate lobe lesion identified on CT earlier today not well demonstrated by ultrasound. Portal vein is patent on color Doppler imaging with normal direction of blood flow towards the liver. Other: None. IMPRESSION: Unremarkable right upper quadrant ultrasound. 4.1 cm caudate lobe lesion identified on CT earlier today not well demonstrated by ultrasound. Please see follow up recommendations in the report for that CT. Electronically Signed   By: Kennith Center M.D.   On: 03/18/2023 06:58   CT ABDOMEN PELVIS W CONTRAST  Result Date: 03/18/2023 CLINICAL DATA:  Right lower quadrant pain. Renal stone CT 02/14/2020 EXAM: CT ABDOMEN AND PELVIS WITH CONTRAST TECHNIQUE: Multidetector CT imaging of the abdomen and pelvis was performed using the standard protocol following bolus administration of intravenous contrast. RADIATION DOSE REDUCTION: This exam was performed according to the departmental dose-optimization program which includes automated exposure control, adjustment of the mA and/or kV according to patient size and/or use of iterative reconstruction technique. CONTRAST:  OMNIPAQUE IOHEXOL 300 MG/ML  SOLN COMPARISON:  02/14/2020 FINDINGS: Lower chest:  No contributory findings. Hepatobiliary: Nearly 4 cm mildly dense mass at the caudate lobe, not seen on prior.No evidence  of biliary obstruction or stone. Pancreas: Unremarkable. Spleen: Unremarkable. Adrenals/Urinary Tract: Negative adrenals. No hydronephrosis or stone. Unremarkable bladder. Stomach/Bowel:  No obstruction. No appendicitis. Vascular/Lymphatic: No acute vascular abnormality. No mass or adenopathy. Reproductive:No pathologic findings. Other: No ascites or pneumoperitoneum. Musculoskeletal: No acute abnormalities. IMPRESSION: 1. No appendicitis or other acute finding. 2. Nearly 4 cm liver mass at the caudate lobe which could be hemangioma, FNH, or adenoma. Suggest characterization with outpatient enhanced liver MRI. Electronically Signed   By: Tiburcio Pea M.D.   On: 03/18/2023 05:12    Procedures Procedures  {Document cardiac monitor, telemetry assessment procedure when appropriate:1}  Medications Ordered in ED Medications - No data to display  ED Course/ Medical Decision Making/ A&P   {   Click here for ABCD2, HEART and other calculatorsREFRESH Note before signing :1}                              Medical Decision Making Amount and/or Complexity of Data Reviewed Labs:  ordered. Radiology: ordered.  Risk Prescription drug management.   ***  {Document critical care time when appropriate:1} {Document review of labs and clinical decision tools ie heart score, Chads2Vasc2 etc:1}  {Document your independent review of radiology images, and any outside records:1} {Document your discussion with family members, caretakers, and with consultants:1} {Document social determinants of health affecting pt's care:1} {Document your decision making why or why not admission, treatments were needed:1} Final Clinical Impression(s) / ED Diagnoses Final diagnoses:  None    Rx / DC Orders ED Discharge Orders     None

## 2023-03-22 ENCOUNTER — Emergency Department (HOSPITAL_COMMUNITY): Payer: Medicaid Other

## 2023-03-22 ENCOUNTER — Encounter (HOSPITAL_COMMUNITY): Payer: Self-pay

## 2023-03-22 ENCOUNTER — Emergency Department (HOSPITAL_COMMUNITY)
Admission: EM | Admit: 2023-03-22 | Discharge: 2023-03-22 | Disposition: A | Discharge status: Home / Self Care | Payer: Medicaid Other | Attending: Emergency Medicine | Admitting: Emergency Medicine

## 2023-03-22 ENCOUNTER — Other Ambulatory Visit: Payer: Self-pay

## 2023-03-22 DIAGNOSIS — R519 Headache, unspecified: Secondary | ICD-10-CM | POA: Insufficient documentation

## 2023-03-22 DIAGNOSIS — M549 Dorsalgia, unspecified: Secondary | ICD-10-CM | POA: Diagnosis present

## 2023-03-22 DIAGNOSIS — M545 Low back pain, unspecified: Secondary | ICD-10-CM | POA: Diagnosis not present

## 2023-03-22 DIAGNOSIS — Y92410 Unspecified street and highway as the place of occurrence of the external cause: Secondary | ICD-10-CM | POA: Diagnosis not present

## 2023-03-22 DIAGNOSIS — Z7982 Long term (current) use of aspirin: Secondary | ICD-10-CM | POA: Diagnosis not present

## 2023-03-22 LAB — PREGNANCY, URINE: Preg Test, Ur: NEGATIVE
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
## 2023-03-26 ENCOUNTER — Encounter (HOSPITAL_COMMUNITY): Payer: Self-pay

## 2023-03-27 ENCOUNTER — Ambulatory Visit (HOSPITAL_COMMUNITY): Payer: Medicaid Other | Admitting: Mental Health

## 2023-03-29 NOTE — Progress Notes (Deleted)
Psychiatric Initial Adult Assessment  Patient Identification: Heidi Martin MRN:  [id number] Date of Evaluation:  03/29/2023 Referral Source: Milas Hock, MD  Assessment:  LASHAUNDRA Martin is a 22 y.o. female with a history of *** sickle cell trait, migraines, and IBS who presents to [facility] via video conferencing for initial evaluation of ***.  Patient reports ***  Plan:  # *** Past medication trials:  Status of problem: *** Interventions: -- ***  # *** Past medication trials:  Status of problem: *** Interventions: -- ***  # *** Past medication trials:  Status of problem: *** Interventions: -- ***  Patient was given contact information for behavioral health clinic and was instructed to call 911 for emergencies.   Subjective:  Chief Complaint: No chief complaint on file.   History of Present Illness:  ***  Chart review: -- Referred by Ob/Gyn in July 2024 for bipolar 1 disorder.  At that appointment, patient noted to be 6 weeks postpartum following delivery at 37 gestational weeks.  EPDS score of 12.   Bday tmrw Postpartum   Past Psychiatric History:  Diagnoses: ***self reported diagnoses of bipolar disorder and ADHD Medication trials: *** Previous psychiatrist/therapist: *** Hospitalizations: *** Suicide attempts: *** SIB: *** Hx of violence towards others: *** Current access to guns: *** Hx of trauma/abuse: ***  Previous Psychotropic Medications: {YES/NO:21197}  Substance Abuse History in the last 12 months:  {yes no:314532}  Past Medical History:  Past Medical History:  Diagnosis Date   Alpha thalassemia silent carrier    Chronic intractable headache 10/13/2014   COVID-19    Depression    Fatigue 05/31/2015   Irritable bowel syndrome (IBS)    Musculoskeletal pain 05/31/2015   PID (acute pelvic inflammatory disease)    Sickle cell trait (HCC)    Trichomonas infection    UTI (urinary tract infection)     Past  Surgical History:  Procedure Laterality Date   DILATION AND EVACUATION N/A 10/27/2021   Procedure: DILATATION AND EVACUATION;  Surgeon:  Bing, MD;  Location: [facility] OR;  Service: Gynecology;  Laterality: N/A;    Family Psychiatric History: ***  Family History:  Family History  Problem Relation Age of Onset   Cancer Mother    Depression Mother    Miscarriages / Stillbirths Mother    Hypertension Mother    Hypertension Father    Cancer Maternal Grandmother    Diabetes Maternal Grandmother    Miscarriages / Stillbirths Maternal Grandmother     Social History:   Academic/Vocational: ***  Social History   Socioeconomic History   Marital status: Single    Spouse name: Not on file   Number of children: Not on file   Years of education: Not on file   Highest education level: Not on file  Occupational History   Not on file  Tobacco Use   Smoking status: Never   Smokeless tobacco: Never  Vaping Use   Vaping status: Never Used  Substance and Sexual Activity   Alcohol use: No   Drug use: No   Sexual activity: Not Currently    Birth control/protection: None    Comment: last IC Saturday 11/11/22  Other Topics Concern   Not on file  Social History Narrative   Not on file   Social Determinants of Health   Financial Resource Strain: Not on file  Food Insecurity: Food Insecurity Present (11/22/2022)   Hunger Vital Sign    Worried About Running Out of Food in the Last  Year: Sometimes true    Ran Out of Food in the Last Year: Sometimes true  Transportation Needs: No Transportation Needs (12/02/2022)   PRAPARE - Administrator, Civil Service (Medical): No    Lack of Transportation (Non-Medical): No  Physical Activity: Not on file  Stress: Not on file  Social Connections: Not on file    Additional Social History: updated  Allergies:   Allergies  Allergen Reactions   Milk [Milk (Cow)] Nausea And Vomiting and Other (See Comments)    Stomach cramping     Current Medications: Current Outpatient Medications  Medication Sig Dispense Refill   aspirin EC 325 MG tablet Take 325 mg by mouth daily as needed for mild pain (or headaches).     celecoxib (CELEBREX) 200 MG capsule Take 1 capsule (200 mg total) by mouth 2 (two) times daily. 20 capsule 0   ESTARYLLA 0.25-35 MG-MCG tablet Take 1 tablet by mouth daily.     etonogestrel-ethinyl estradiol (NUVARING) 0.12-0.015 MG/24HR vaginal ring Insert vaginally and leave in place for 3 consecutive weeks, then remove for 1 week. (Patient not taking: Reported on 03/18/2023) 1 each 12   ferrous sulfate 325 (65 FE) MG tablet Take 1 tablet (325 mg total) by mouth every other day. (Patient not taking: Reported on 03/19/2023) 60 tablet 0   hyoscyamine (LEVSIN/SL) 0.125 MG SL tablet Place 1 tablet (0.125 mg total) under the tongue every 6 (six) hours as needed for cramping (pain). Up to 1.25 mg daily 30 tablet 1   ibuprofen (ADVIL) 600 MG tablet Take 1 tablet (600 mg total) by mouth every 6 (six) hours. (Patient not taking: Reported on 03/18/2023) 30 tablet 0   megestrol (MEGACE) 40 MG tablet Take 1 tablet (40 mg total) by mouth 2 (two) times daily. Can increase to two tablets twice a day in the event of heavy bleeding (Patient not taking: Reported on 03/18/2023) 60 tablet 5   ondansetron (ZOFRAN-ODT) 4 MG disintegrating tablet Take 1 tablet (4 mg total) by mouth every 8 (eight) hours as needed for nausea or vomiting. (Patient not taking: Reported on 03/19/2023) 20 tablet 0   oxyCODONE-acetaminophen (PERCOCET) 5-325 MG tablet Take 1 tablet by mouth every 6 (six) hours as needed. 12 tablet 0   pantoprazole (PROTONIX) 20 MG tablet Take 1 tablet (20 mg total) by mouth daily. 30 tablet 1   Prenatal Vit-Min-FA-Fish Oil ([employer] PRENATAL GUMMY) 0.4-113.5 MG CHEW Chew 1 each by mouth daily after breakfast. (Patient not taking: Reported on 03/18/2023) 60 tablet 3   prochlorperazine (COMPAZINE) 10 MG tablet Take 1 tablet (10 mg total) by  mouth 2 (two) times daily as needed for nausea or vomiting. 20 tablet 0   TYLENOL 325 MG CAPS Take 325-650 mg by mouth every 6 (six) hours as needed (for pain or headaches).     No current facility-administered medications for this visit.    ROS: Review of Systems  Objective:  Psychiatric Specialty Exam: Last menstrual period 03/05/2023, not currently breastfeeding.There is no height or weight on file to calculate BMI.  General Appearance: {Appearance:22683}  Eye Contact:  {BHH EYE CONTACT:22684}  Speech:  {Speech:22685}  Volume:  {Volume (PAA):22686}  Mood:  {BHH MOOD:22306}  Affect:  {Affect (PAA):22687}  Thought Content: {Thought Content:22690}   Suicidal Thoughts:  {ST/HT (PAA):22692}  Homicidal Thoughts:  {ST/HT (PAA):22692}  Thought Process:  {Thought Process (PAA):22688}  Orientation:  {BHH ORIENTATION (PAA):22689}    Memory: Grossly intact ***  Judgment:  {Judgement (PAA):22694}  Insight:  {  Insight (PAA):22695}  Concentration:  {Concentration:21399}  Recall:  not formally assessed ***  Fund of Knowledge: {BHH GOOD/FAIR/POOR:22877}  Language: {BHH GOOD/FAIR/POOR:22877}  Psychomotor Activity:  {Psychomotor (PAA):22696}  Akathisia:  {BHH YES OR NO:22294}  AIMS (if indicated): {Desc; done/not:10129}  Assets:  {Assets (PAA):22698}  ADL's:  {BHH ZOX'W:96045}  Cognition: {chl bhh cognition:304700322}  Sleep:  {BHH GOOD/FAIR/POOR:22877}   PE: General: sits comfortably in view of camera; no acute distress *** Pulm: no increased work of breathing on room air *** MSK: all extremity movements appear intact *** Neuro: no focal neurological deficits observed *** Gait & Station: unable to assess by video ***   Metabolic Disorder Labs: Lab Results  Component Value Date   HGBA1C 5.5 06/12/2022   Lab Results  Component Value Date   PROLACTIN 10.2 01/30/2023   PROLACTIN 5.9 10/02/2013   No results found for: "CHOL", "TRIG", "HDL", "CHOLHDL", "VLDL", "LDLCALC" Lab  Results  Component Value Date   TSH 0.672 01/30/2023    Therapeutic Level Labs: No results found for: "LITHIUM" No results found for: "CBMZ" No results found for: "VALPROATE"  Screenings:  GAD-7    Flowsheet Row Office Visit from 01/30/2023 in [facility] [employer] [facility] Routine Prenatal from 11/22/2022 in [facility] [employer] [facility] Scanned Document from 07/12/2022 in [facility] [employer] [facility] Initial Prenatal from 06/12/2022 in [facility] [employer] [facility] Clinical Support from 10/05/2021 in [facility]  Total GAD-7 Score 16 6 1 9 7       PHQ2-9    Flowsheet Row Office Visit [facility] 01/30/2023 in [facility] Routine Prenatal from 11/22/2022 in [facility] at [facility] Scanned Document from 07/12/2022 in [facility] at [facility] Initial Prenatal from 06/12/2022 in [facility] Clinical Support from 10/05/2021 in [facility]  PHQ-2 Total Score 2 6 4 4  0  PHQ-9 Total Score 11 14 12 13 6       Flowsheet Row ED from 03/22/2023 in [facility] at [facility] ED from 03/19/2023 in [facility] Department at [facility] ED from 03/18/2023 in [facility] Emergency Department at [facility]  C-SSRS RISK CATEGORY No Risk No Risk No Risk       Collaboration of Care: Collaboration of Care: [facility] OP Collaboration of WUJW:[id number]}  Patient/Guardian was advised Release of Information must be obtained prior to any record release in order to collaborate their care with an outside provider. Patient/Guardian was advised if they have  not already done so to contact the registration department to sign all necessary forms in order for [place] to release information regarding their care.   Consent: Patient/Guardian gives verbal consent for treatment and assignment of benefits for services provided during this visit. Patient/Guardian expressed understanding and agreed to proceed.   Televisit via video: I connected with [name] on 03/29/23 at  9:00 AM EDT by a video enabled telemedicine application and verified that I am speaking with the correct person using two identifiers.  Location: Patient: *** Provider: Remote office in [place]   I discussed the limitations of evaluation and management by telemedicine and the availability of in  person appointments. The patient expressed understanding and agreed to proceed.  I discussed the assessment and treatment plan with the patient. The patient was provided an opportunity to ask questions and all were answered. The patient agreed with the plan and demonstrated an understanding of the instructions.   The patient was advised to call back or seek an in-person evaluation if the symptoms worsen or if the condition fails to improve as anticipated.  I provided *** minutes dedicated to the care of this patient via video on the date of this encounter to include chart review, face-to-face time with the patient, medication management/counseling ***.  Aleda Madl A Ryana Montecalvo 9/26/202412:42 PM

## 2023-03-30 ENCOUNTER — Ambulatory Visit (HOSPITAL_COMMUNITY): Payer: Medicaid Other | Admitting: Psychiatry

## 2023-04-27 ENCOUNTER — Other Ambulatory Visit: Payer: Self-pay

## 2023-04-27 ENCOUNTER — Emergency Department (HOSPITAL_COMMUNITY)
Admission: EM | Admit: 2023-04-27 | Discharge: 2023-04-28 | Discharge status: Against Medical Advice | Payer: No Typology Code available for payment source | Attending: Emergency Medicine | Admitting: Emergency Medicine

## 2023-04-27 ENCOUNTER — Encounter (HOSPITAL_COMMUNITY): Payer: Self-pay

## 2023-04-27 DIAGNOSIS — Z1152 Encounter for screening for COVID-19: Secondary | ICD-10-CM | POA: Insufficient documentation

## 2023-04-27 DIAGNOSIS — M791 Myalgia, unspecified site: Secondary | ICD-10-CM | POA: Insufficient documentation

## 2023-04-27 DIAGNOSIS — Z5321 Procedure and treatment not carried out due to patient leaving prior to being seen by health care provider: Secondary | ICD-10-CM | POA: Insufficient documentation

## 2023-04-27 DIAGNOSIS — R519 Headache, unspecified: Secondary | ICD-10-CM | POA: Diagnosis present

## 2023-04-27 DIAGNOSIS — R109 Unspecified abdominal pain: Secondary | ICD-10-CM | POA: Insufficient documentation

## 2023-04-27 DIAGNOSIS — R110 Nausea: Secondary | ICD-10-CM | POA: Insufficient documentation

## 2023-04-27 LAB — BASIC METABOLIC PANEL
Anion gap: 8 (ref 5–15)
BUN: 7 mg/dL (ref 6–20)
CO2: 25 mmol/L (ref 22–32)
Calcium: 9.1 mg/dL (ref 8.9–10.3)
Chloride: 108 mmol/L (ref 98–111)
Creatinine, Ser: 0.88 mg/dL (ref 0.44–1.00)
GFR, Estimated: >60 mL/min (ref >60)
Glucose, Bld: 94 mg/dL (ref 70–99)
Potassium: 3.9 mmol/L (ref 3.5–5.1)
Sodium: 141 mmol/L (ref 135–145)

## 2023-04-27 LAB — CBC
HCT: 33.6 % — ABNORMAL LOW (ref 36.0–46.0)
Hemoglobin: 10.7 g/dL — ABNORMAL LOW (ref 12.0–15.0)
MCH: 24 pg — ABNORMAL LOW (ref 26.0–34.0)
MCHC: 31.8 g/dL (ref 30.0–36.0)
MCV: 75.5 fL — ABNORMAL LOW (ref 80.0–100.0)
Platelets: 307 10*3/uL (ref 150–400)
RBC: 4.45 MIL/uL (ref 3.87–5.11)
RDW: 17.2 % — ABNORMAL HIGH (ref 11.5–15.5)
WBC: 8.6 10*3/uL (ref 4.0–10.5)
nRBC: 0 % (ref 0.0–0.2)

## 2023-04-27 LAB — RESP PANEL BY RT-PCR (RSV, FLU A&B, COVID)  RVPGX2
Influenza A by PCR: NEGATIVE
Influenza B by PCR: NEGATIVE
Resp Syncytial Virus by PCR: NEGATIVE
SARS Coronavirus 2 by RT PCR: NEGATIVE

## 2023-04-27 NOTE — ED Triage Notes (Signed)
Patient reports headache, abdominal pain, nausea, and body aches x 3 days. Denies fevers.

## 2023-04-27 NOTE — ED Notes (Signed)
Called pt in lobby to update vitals no answer.

## 2023-05-12 ENCOUNTER — Telehealth: Payer: Medicaid Other | Admitting: Physician Assistant

## 2023-05-12 DIAGNOSIS — M549 Dorsalgia, unspecified: Secondary | ICD-10-CM

## 2023-05-12 DIAGNOSIS — R079 Chest pain, unspecified: Secondary | ICD-10-CM

## 2023-05-12 NOTE — Patient Instructions (Signed)
Heidi Martin, thank you for joining Laure Kidney, PA-C for today's virtual visit.  While this provider is not your primary care provider (PCP), if your PCP is located in our provider database this encounter information will be shared with them immediately following your visit.   A [facility] MyChart account gives you access to today's visit and all your visits, tests, and labs performed at [facility] " click here if you don't have a [facility] MyChart account or go to [email or web address]  Consent: (Patient) Heidi Martin provided verbal consent for this virtual visit at the beginning of the encounter.  Current Medications:  Current Outpatient Medications:    aspirin EC 325 MG tablet, Take 325 mg by mouth daily as needed for mild pain (or headaches)., Disp: , Rfl:    celecoxib (CELEBREX) 200 MG capsule, Take 1 capsule (200 mg total) by mouth 2 (two) times daily., Disp: 20 capsule, Rfl: 0   ESTARYLLA 0.25-35 MG-MCG tablet, Take 1 tablet by mouth daily., Disp: , Rfl:    etonogestrel-ethinyl estradiol (NUVARING) 0.12-0.015 MG/24HR vaginal ring, Insert vaginally and leave in place for 3 consecutive weeks, then remove for 1 week. (Patient not taking: Reported on 03/18/2023), Disp: 1 each, Rfl: 12   ferrous sulfate 325 (65 FE) MG tablet, Take 1 tablet (325 mg total) by mouth every other day. (Patient not taking: Reported on 03/19/2023), Disp: 60 tablet, Rfl: 0   hyoscyamine (LEVSIN/SL) 0.125 MG SL tablet, Place 1 tablet (0.125 mg total) under the tongue every 6 (six) hours as needed for cramping (pain). Up to 1.25 mg daily, Disp: 30 tablet, Rfl: 1   ibuprofen (ADVIL) 600 MG tablet, Take 1 tablet (600 mg total) by mouth every 6 (six) hours. (Patient not taking: Reported on 03/18/2023), Disp: 30 tablet, Rfl: 0   megestrol (MEGACE) 40 MG tablet, Take 1 tablet (40 mg total) by mouth 2 (two) times daily. Can increase to two tablets twice a day in the event of heavy bleeding (Patient not  taking: Reported on 03/18/2023), Disp: 60 tablet, Rfl: 5   ondansetron (ZOFRAN-ODT) 4 MG disintegrating tablet, Take 1 tablet (4 mg total) by mouth every 8 (eight) hours as needed for nausea or vomiting. (Patient not taking: Reported on 03/19/2023), Disp: 20 tablet, Rfl: 0   oxyCODONE-acetaminophen (PERCOCET) 5-325 MG tablet, Take 1 tablet by mouth every 6 (six) hours as needed., Disp: 12 tablet, Rfl: 0   pantoprazole (PROTONIX) 20 MG tablet, Take 1 tablet (20 mg total) by mouth daily., Disp: 30 tablet, Rfl: 1   Prenatal Vit-Min-FA-Fish Oil ([employer] PRENATAL GUMMY) 0.4-113.5 MG CHEW, Chew 1 each by mouth daily after breakfast. (Patient not taking: Reported on 03/18/2023), Disp: 60 tablet, Rfl: 3   prochlorperazine (COMPAZINE) 10 MG tablet, Take 1 tablet (10 mg total) by mouth 2 (two) times daily as needed for nausea or vomiting., Disp: 20 tablet, Rfl: 0   TYLENOL 325 MG CAPS, Take 325-650 mg by mouth every 6 (six) hours as needed (for pain or headaches)., Disp: , Rfl:    Medications ordered in this encounter:  No orders of the defined types were placed in this encounter.    *If you need refills on other medications prior to your next appointment, please contact your pharmacy*  Follow-Up: Call back or seek an in-person evaluation if the symptoms worsen or if the condition fails to improve as anticipated.  [facility] [phone]  Other Instructions Report to Urgent Care or ER for evaluation of chest  pain.    If you have been instructed to have an in-person evaluation today at a local Urgent Care facility, please use the link below. It will take you to a list of all of our available [facility], including address, phone number and hours of operation. Please do not delay care.  [facility]  If you or a family member do not have a primary care provider, use the link below to schedule a visit and establish care. When you choose a [facility] primary care  physician or advanced practice provider, you gain a long-term partner in health. Find a Primary Care Provider  Learn more about [facility]'s in-office and virtual care options: [facility] Get Care Now
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
## 2023-05-23 ENCOUNTER — Ambulatory Visit: Payer: Self-pay | Admitting: Neurology

## 2023-06-05 ENCOUNTER — Emergency Department (HOSPITAL_COMMUNITY)
Admission: EM | Admit: 2023-06-05 | Discharge: 2023-06-05 | Disposition: A | Discharge status: Home / Self Care | Payer: Medicaid Other | Attending: Emergency Medicine | Admitting: Emergency Medicine

## 2023-06-05 ENCOUNTER — Other Ambulatory Visit: Payer: Self-pay

## 2023-06-05 ENCOUNTER — Emergency Department (HOSPITAL_COMMUNITY): Payer: Medicaid Other

## 2023-06-05 DIAGNOSIS — M545 Low back pain, unspecified: Secondary | ICD-10-CM | POA: Diagnosis not present

## 2023-06-05 DIAGNOSIS — R0789 Other chest pain: Secondary | ICD-10-CM | POA: Insufficient documentation

## 2023-06-05 DIAGNOSIS — R200 Anesthesia of skin: Secondary | ICD-10-CM | POA: Diagnosis not present

## 2023-06-05 DIAGNOSIS — R079 Chest pain, unspecified: Secondary | ICD-10-CM

## 2023-06-05 LAB — TROPONIN I (HIGH SENSITIVITY): Troponin I (High Sensitivity): <2 ng/L (ref <18)

## 2023-06-05 LAB — CBC
HCT: 35.7 % — ABNORMAL LOW (ref 36.0–46.0)
Hemoglobin: 11.2 g/dL — ABNORMAL LOW (ref 12.0–15.0)
MCH: 24 pg — ABNORMAL LOW (ref 26.0–34.0)
MCHC: 31.4 g/dL (ref 30.0–36.0)
MCV: 76.4 fL — ABNORMAL LOW (ref 80.0–100.0)
Platelets: 326 10*3/uL (ref 150–400)
RBC: 4.67 MIL/uL (ref 3.87–5.11)
RDW: 17.7 % — ABNORMAL HIGH (ref 11.5–15.5)
WBC: 5.5 10*3/uL (ref 4.0–10.5)
nRBC: 0 % (ref 0.0–0.2)

## 2023-06-05 LAB — BASIC METABOLIC PANEL
Anion gap: 6 (ref 5–15)
BUN: 5 mg/dL — ABNORMAL LOW (ref 6–20)
CO2: 26 mmol/L (ref 22–32)
Calcium: 9 mg/dL (ref 8.9–10.3)
Chloride: 105 mmol/L (ref 98–111)
Creatinine, Ser: 0.81 mg/dL (ref 0.44–1.00)
GFR, Estimated: >60 mL/min (ref >60)
Glucose, Bld: 95 mg/dL (ref 70–99)
Potassium: 4.2 mmol/L (ref 3.5–5.1)
Sodium: 137 mmol/L (ref 135–145)

## 2023-06-05 LAB — HCG, SERUM, QUALITATIVE: Preg, Serum: NEGATIVE

## 2023-06-05 MED ORDER — ACETAMINOPHEN 500 MG PO TABS
1000.0000 mg | ORAL_TABLET | Freq: Once | ORAL | Status: AC
  Administered 2023-06-05: 1000 mg via ORAL
  Filled 2023-06-05: qty 2

## 2023-06-05 MED ORDER — ALUM & MAG HYDROXIDE-SIMETH 200-200-20 MG/5ML PO SUSP
30.0000 mL | Freq: Once | ORAL | Status: AC
  Administered 2023-06-05: 30 mL via ORAL
  Filled 2023-06-05: qty 30

## 2023-06-05 MED ORDER — FAMOTIDINE 20 MG PO TABS
20.0000 mg | ORAL_TABLET | Freq: Once | ORAL | Status: AC
  Administered 2023-06-05: 20 mg via ORAL
  Filled 2023-06-05: qty 1

## 2023-06-05 MED ORDER — PANTOPRAZOLE SODIUM 20 MG PO TBEC: 20.0000 mg | DELAYED_RELEASE_TABLET | Freq: Every day | ORAL | 0 refills | Status: AC

## 2023-06-05 NOTE — ED Triage Notes (Addendum)
Pt. Stated, I've had chest pain and right side of my body is numb for almost 2 weeks. VAN neg. No weakness noted. The chest pain is there nothing makes it worse or better

## 2023-06-05 NOTE — ED Provider Notes (Signed)
[place] EMERGENCY DEPARTMENT AT [facility] Provider Note   CSN: [id number] Arrival date & time: 06/05/23  8416     History {Add pertinent medical, surgical, social history, OB history to HPI:1} Chief Complaint  Patient presents with   Chest Pain   Numbness   HPI Heidi Martin is a 22 y.o. female presenting for chest pain and right leg numbness.  Started 2 weeks ago.  Chest pain is in the center of her chest and is constant.  Feels like a sharp pain.  It is reproducible and nonradiating.  Denies cough and shortness of breath.  States that the right leg has been feeling "tingly" and painful at times.  This started around the same time as the chest pain.  States she is still able to ambulate and bear weight.  Denies trauma to the leg.  Denies any swelling or redness in the leg as well.  Also mentioned some generalized lower back pain.  Denies urinary or bowel incontinence or retention.  Denies saddle anesthesia and fever. Denies FH of MS.   Chest Pain      Home Medications Prior to Admission medications   Medication Sig Start Date End Date Taking? Authorizing Provider  aspirin EC 325 MG tablet Take 325 mg by mouth daily as needed for mild pain (or headaches).    [provider]  celecoxib (CELEBREX) 200 MG capsule Take 1 capsule (200 mg total) by mouth 2 (two) times daily. 03/19/23   Harris, Abigail, PA-C  ESTARYLLA 0.25-35 MG-MCG tablet Take 1 tablet by mouth daily. 03/15/23   [provider]  etonogestrel-ethinyl estradiol (NUVARING) 0.12-0.015 MG/24HR vaginal ring Insert vaginally and leave in place for 3 consecutive weeks, then remove for 1 week. Patient not taking: Reported on 03/18/2023 01/09/23   Milas Hock, MD  ferrous sulfate 325 (65 FE) MG tablet Take 1 tablet (325 mg total) by mouth every other day. Patient not taking: Reported on 03/19/2023 12/05/22   Brand Males, CNM  hyoscyamine (LEVSIN/SL) 0.125 MG SL tablet Place 1 tablet (0.125 mg  total) under the tongue every 6 (six) hours as needed for cramping (pain). Up to 1.25 mg daily 03/19/23   Arthor Captain, PA-C  ibuprofen (ADVIL) 600 MG tablet Take 1 tablet (600 mg total) by mouth every 6 (six) hours. Patient not taking: Reported on 03/18/2023 12/04/22   Brand Males, CNM  megestrol (MEGACE) 40 MG tablet Take 1 tablet (40 mg total) by mouth 2 (two) times daily. Can increase to two tablets twice a day in the event of heavy bleeding Patient not taking: Reported on 03/18/2023 01/30/23   Lorriane Shire, MD  ondansetron (ZOFRAN-ODT) 4 MG disintegrating tablet Take 1 tablet (4 mg total) by mouth every 8 (eight) hours as needed for nausea or vomiting. Patient not taking: Reported on 03/19/2023 03/18/23   Judithann Sheen, PA  oxyCODONE-acetaminophen (PERCOCET) 5-325 MG tablet Take 1 tablet by mouth every 6 (six) hours as needed. 03/18/23   Charlynne Pander, MD  pantoprazole (PROTONIX) 20 MG tablet Take 1 tablet (20 mg total) by mouth daily. 03/19/23   Arthor Captain, PA-C  Prenatal Vit-Min-FA-Fish Oil ([employer] PRENATAL GUMMY) 0.4-113.5 MG CHEW Chew 1 each by mouth daily after breakfast. Patient not taking: Reported on 03/18/2023 11/25/22   Bernerd Limbo, CNM  prochlorperazine (COMPAZINE) 10 MG tablet Take 1 tablet (10 mg total) by mouth 2 (two) times daily as needed for nausea or vomiting. 03/19/23   Arthor Captain, PA-C  TYLENOL 325 MG CAPS Take 325-650 mg by mouth every 6 (six) hours as needed (for pain or headaches).    [provider]  dicyclomine (BENTYL) 20 MG tablet Take 1 tablet (20 mg total) by mouth 2 (two) times daily. Patient not taking: No sig reported 11/14/19 02/14/20  Renne Crigler, PA-C      Allergies    Milk [milk (cow)]    Review of Systems   Review of Systems  Cardiovascular:  Positive for chest pain.    Physical Exam Updated Vital Signs BP 129/88 (BP Location: Left Arm)   Pulse 69   Temp 98.8 F (37.1 C) (Oral)   Resp 16   Ht 5\' 7"  (1.702 m)    Wt 88.5 kg   LMP 05/28/2023   SpO2 100%   BMI 30.54 kg/m  Physical Exam Vitals and nursing note reviewed.  HENT:     Head: Normocephalic and atraumatic.     Mouth/Throat:     Mouth: Mucous membranes are moist.  Eyes:     General:        Right eye: No discharge.        Left eye: No discharge.     Conjunctiva/sclera: Conjunctivae normal.  Cardiovascular:     Rate and Rhythm: Normal rate and regular rhythm.     Pulses: Normal pulses.     Heart sounds: Normal heart sounds.  Pulmonary:     Effort: Pulmonary effort is normal.     Breath sounds: Normal breath sounds.  Chest:     Chest wall: Tenderness present.    Abdominal:     General: Abdomen is flat.     Palpations: Abdomen is soft.  Skin:    General: Skin is warm and dry.  Neurological:     General: No focal deficit present.     Comments: GCS 15. Speech is goal oriented. No deficits appreciated to CN III-XII; symmetric eyebrow raise, no facial drooping, tongue midline. Patient has equal grip strength bilaterally with 5/5 strength against resistance in all major muscle groups bilaterally. Sensation to light touch intact. Patient moves extremities without ataxia. Normal finger-nose-finger. Patient ambulatory with steady gait.  Prepatellar reflexes are 1+ bilaterally.  Normal Babinski sign bilaterally.  Psychiatric:        Mood and Affect: Mood normal.     ED Results / Procedures / Treatments   Labs (all labs ordered are listed, but only abnormal results are displayed) Labs Reviewed  BASIC METABOLIC PANEL - Abnormal; Notable for the following components:      Result Value   BUN 5 (*)    All other components within normal limits  CBC - Abnormal; Notable for the following components:   Hemoglobin 11.2 (*)    HCT 35.7 (*)    MCV 76.4 (*)    MCH 24.0 (*)    RDW 17.7 (*)    All other components within normal limits  HCG, SERUM, QUALITATIVE  TROPONIN I (HIGH SENSITIVITY)  TROPONIN I (HIGH SENSITIVITY)     EKG None  Radiology DG Chest 2 View  Result Date: 06/05/2023 CLINICAL DATA:  chest pain.  Shortness of breath. EXAM: CHEST - 2 VIEW COMPARISON:  03/18/2023. FINDINGS: Bilateral lung fields are clear. Bilateral costophrenic angles are clear. Normal cardio-mediastinal silhouette. No acute osseous abnormalities. The soft tissues are within normal limits. IMPRESSION: *No active cardiopulmonary disease. Electronically Signed   By: Jules Schick M.D.   On: 06/05/2023 11:22    Procedures Procedures  {Document cardiac monitor,  telemetry assessment procedure when appropriate:1}  Medications Ordered in ED Medications  acetaminophen (TYLENOL) tablet 1,000 mg (1,000 mg Oral Given 06/05/23 1303)  famotidine (PEPCID) tablet 20 mg (20 mg Oral Given 06/05/23 1303)  alum & mag hydroxide-simeth (MAALOX/MYLANTA) 200-200-20 MG/5ML suspension 30 mL (30 mLs Oral Given 06/05/23 1303)    ED Course/ Medical Decision Making/ A&P   {   Click here for ABCD2, HEART and other calculatorsREFRESH Note before signing :1}                              Medical Decision Making Amount and/or Complexity of Data Reviewed Labs: ordered. Radiology: ordered.  Risk OTC drugs.   Initial Impression and Ddx 22 year old well-appearing female presenting for chest pain and right leg pain and tingling.  Exam notable for chest wall tenderness but was otherwise reassuring with no focal neurodeficits.  DDx includes ACS, PE, GERD, costochondritis, stroke, Katheran Awe, cauda equina, MS, other. Patient PMH that increases complexity of ED encounter:  none  Interpretation of Diagnostics - I independent reviewed and interpreted the labs as followed: No acute derangements  - I independently visualized the following imaging with scope of interpretation limited to determining acute life threatening conditions related to emergency care: CXR, which revealed no acute cardiopulmonary processes  -I personally reviewed interpret EKG  which revealed normal sinus rhythm  Patient Reassessment and Ultimate Disposition/Management On reassessment, chest pain and right leg symptoms improved.  Doubt stroke given no focal neurodeficits on exam.  Also doubt Guillain-Barr given asymmetric symptoms of numbness.  Doubt cauda equina given lack of pressure-like symptoms.  Doubt MS given reassuring neuroexam and reflexes.  Also workup not suggestive of ACS or PE. Chest pain could be related to GERD. Started her on 30 day trial of Protonix.  Advised patient to follow-up with PCP.  Vital stable at this time.  Discussed return precautions.   Discharged in good condition.  Patient management required discussion with the following services or consulting groups:  None  Complexity of Problems Addressed Acute complicated illness or Injury  Additional Data Reviewed and Analyzed Further history obtained from: Past medical history and medications listed in the EMR and Prior ED visit notes  Patient Encounter Risk Assessment Prescriptions   {Document critical care time when appropriate:1} {Document review of labs and clinical decision tools ie heart score, Chads2Vasc2 etc:1}  {Document your independent review of radiology images, and any outside records:1} {Document your discussion with family members, caretakers, and with consultants:1} {Document social determinants of health affecting pt's care:1} {Document your decision making why or why not admission, treatments were needed:1} Final Clinical Impression(s) / ED Diagnoses Final diagnoses:  Chest pain, unspecified type  Numbness and tingling of right leg    Rx / DC Orders ED Discharge Orders     None

## 2023-06-05 NOTE — Discharge Instructions (Addendum)
Evaluation today was overall reassuring.  Suspect your chest pain could be related to GERD.  I am starting on a month-long supply of Protonix.  Recommend you follow-up with your PCP for reevaluation of your chest pain and right leg pain and tingling.

## 2023-08-02 ENCOUNTER — Other Ambulatory Visit: Payer: Self-pay | Admitting: Sports Medicine

## 2023-09-04 ENCOUNTER — Encounter (HOSPITAL_BASED_OUTPATIENT_CLINIC_OR_DEPARTMENT_OTHER): Payer: Self-pay | Admitting: Emergency Medicine

## 2023-09-04 ENCOUNTER — Other Ambulatory Visit: Payer: Self-pay

## 2023-09-04 ENCOUNTER — Emergency Department (HOSPITAL_BASED_OUTPATIENT_CLINIC_OR_DEPARTMENT_OTHER)
Admission: EM | Admit: 2023-09-04 | Discharge: 2023-09-04 | Disposition: A | Discharge status: Home / Self Care | Attending: Emergency Medicine | Admitting: Emergency Medicine

## 2023-09-04 DIAGNOSIS — J069 Acute upper respiratory infection, unspecified: Secondary | ICD-10-CM | POA: Insufficient documentation

## 2023-09-04 DIAGNOSIS — Z7982 Long term (current) use of aspirin: Secondary | ICD-10-CM | POA: Diagnosis not present

## 2023-09-04 DIAGNOSIS — R0981 Nasal congestion: Secondary | ICD-10-CM | POA: Diagnosis present

## 2023-09-04 DIAGNOSIS — Z79899 Other long term (current) drug therapy: Secondary | ICD-10-CM | POA: Insufficient documentation

## 2023-09-04 LAB — RESP PANEL BY RT-PCR (RSV, FLU A&B, COVID)  RVPGX2
Influenza A by PCR: NEGATIVE
Influenza B by PCR: NEGATIVE
Resp Syncytial Virus by PCR: NEGATIVE
SARS Coronavirus 2 by RT PCR: NEGATIVE

## 2023-09-04 NOTE — ED Provider Notes (Signed)
 [place] EMERGENCY DEPARTMENT AT [facility] Provider Note   CSN: [id number] Arrival date & time: 09/04/23  4098     History  Chief Complaint  Patient presents with   Nasal Congestion    Heidi Martin is a 23 y.o. female.  With a history history of depression, IBS presenting to the ED for evaluation of flulike symptoms.  Symptoms include body aches, congestion, rhinorrhea, diarrhea, sore throat.  Symptoms began approximately 4 days ago.  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  She denies fevers, cough, shortness of breath.  HPI     Home Medications Prior to Admission medications   Medication Sig Start Date End Date Taking? Authorizing Provider  aspirin EC 325 MG tablet Take 325 mg by mouth daily as needed for mild pain (or headaches).    [provider]  celecoxib (CELEBREX) 200 MG capsule Take 1 capsule (200 mg total) by mouth 2 (two) times daily. 03/19/23   Harris, Abigail, PA-C  ESTARYLLA 0.25-35 MG-MCG tablet Take 1 tablet by mouth daily. 03/15/23   [provider]  etonogestrel-ethinyl estradiol (NUVARING) 0.12-0.015 MG/24HR vaginal ring Insert vaginally and leave in place for 3 consecutive weeks, then remove for 1 week. Patient not taking: Reported on 03/18/2023 01/09/23   Milas Hock, MD  ferrous sulfate 325 (65 FE) MG tablet Take 1 tablet (325 mg total) by mouth every other day. Patient not taking: Reported on 03/19/2023 12/05/22   Brand Males, CNM  hyoscyamine (LEVSIN/SL) 0.125 MG SL tablet Place 1 tablet (0.125 mg total) under the tongue every 6 (six) hours as needed for cramping (pain). Up to 1.25 mg daily 03/19/23   Arthor Captain, PA-C  ibuprofen (ADVIL) 600 MG tablet Take 1 tablet (600 mg total) by mouth every 6 (six) hours. Patient not taking: Reported on 03/18/2023 12/04/22   Brand Males, CNM  megestrol (MEGACE) 40 MG tablet Take 1 tablet (40 mg total) by mouth 2 (two) times daily. Can increase to two  tablets twice a day in the event of heavy bleeding Patient not taking: Reported on 03/18/2023 01/30/23   Lorriane Shire, MD  ondansetron (ZOFRAN-ODT) 4 MG disintegrating tablet Take 1 tablet (4 mg total) by mouth every 8 (eight) hours as needed for nausea or vomiting. Patient not taking: Reported on 03/19/2023 03/18/23   Judithann Sheen, PA  oxyCODONE-acetaminophen (PERCOCET) 5-325 MG tablet Take 1 tablet by mouth every 6 (six) hours as needed. 03/18/23   Charlynne Pander, MD  pantoprazole (PROTONIX) 20 MG tablet Take 1 tablet (20 mg total) by mouth daily. 06/05/23 07/05/23  Gareth Eagle, PA-C  Prenatal Vit-Min-FA-Fish Oil ([employer] PRENATAL GUMMY) 0.4-113.5 MG CHEW Chew 1 each by mouth daily after breakfast. Patient not taking: Reported on 03/18/2023 11/25/22   Bernerd Limbo, CNM  prochlorperazine (COMPAZINE) 10 MG tablet Take 1 tablet (10 mg total) by mouth 2 (two) times daily as needed for nausea or vomiting. 03/19/23   Arthor Captain, PA-C  TYLENOL 325 MG CAPS Take 325-650 mg by mouth every 6 (six) hours as needed (for pain or headaches).    [provider]  dicyclomine (BENTYL) 20 MG tablet Take 1 tablet (20 mg total) by mouth 2 (two) times daily. Patient not taking: No sig reported 11/14/19 02/14/20  Renne Crigler, PA-C      Allergies    Milk [milk (cow)]    Review of Systems   Review of Systems  HENT:  Positive for  congestion and rhinorrhea.   Musculoskeletal:  Positive for myalgias.  All other systems reviewed and are negative.   Physical Exam Updated Vital Signs BP 123/87 (BP Location: Right Arm)   Pulse 61   Temp (!) 97.5 F (36.4 C) (Oral)   Resp 18   Ht 5\' 7"  (1.702 m)   Wt 88.5 kg   SpO2 100%   BMI 30.54 kg/m  Physical Exam Vitals and nursing note reviewed.  Constitutional:      General: She is not in acute distress.    Appearance: She is well-developed.     Comments: Resting comfortably in bed  HENT:     Head: Normocephalic and atraumatic.     Right  Ear: Tympanic membrane and ear canal normal.     Left Ear: Tympanic membrane and ear canal normal.     Mouth/Throat:     Mouth: Mucous membranes are moist.     Pharynx: Oropharynx is clear. Posterior oropharyngeal erythema present. No oropharyngeal exudate.  Eyes:     Conjunctiva/sclera: Conjunctivae normal.  Cardiovascular:     Rate and Rhythm: Normal rate and regular rhythm.     Heart sounds: No murmur heard. Pulmonary:     Effort: Pulmonary effort is normal. No respiratory distress.     Breath sounds: Normal breath sounds. No wheezing, rhonchi or rales.  Abdominal:     Palpations: Abdomen is soft.     Tenderness: There is no abdominal tenderness.  Musculoskeletal:        General: No swelling.     Cervical back: Neck supple.  Skin:    General: Skin is warm and dry.     Capillary Refill: Capillary refill takes less than 2 seconds.  Neurological:     Mental Status: She is alert.  Psychiatric:        Mood and Affect: Mood normal.     ED Results / Procedures / Treatments   Labs (all labs ordered are listed, but only abnormal results are displayed) Labs Reviewed  RESP PANEL BY RT-PCR (RSV, FLU A&B, COVID)  [id number]    EKG None  Radiology No results found.  Procedures Procedures    Medications Ordered in ED Medications - No data to display  ED Course/ Medical Decision Making/ A&P                                 Medical Decision Making This patient presents to the ED for concern of flulike symptoms, this involves an extensive number of treatment options, and is a complaint that carries with it a high risk of complications and morbidity.  The differential diagnosis includes flu, COVID, RSV, other viral URI  My initial workup includes respiratory panel  Additional history obtained from: Nursing notes from this visit.  I ordered, reviewed and interpreted labs which include: Respiratory panel.  Negative.   Afebrile, hemodynamically stable.  23 year old female  presenting to the ED for evaluation of flulike symptoms that began approximately 4 days ago.  Multiple sick contacts.  She appears well on physical exam.  Does have some congestion and rhinorrhea.  Oropharynx is mildly erythematous.  No respiratory complaints.  Respiratory panel negative.  Suspect viral etiology.  She is educated on typical timeline of symptoms and supportive care.  She was encouraged to follow-up with her primary care provider.  She was given return precautions.  Stable at discharge.  At this time there does not appear to  be any evidence of an acute emergency medical condition and the patient appears stable for discharge with appropriate outpatient follow up. Diagnosis was discussed with patient who verbalizes understanding of care plan and is agreeable to discharge. I have discussed return precautions with patient who verbalizes understanding. Patient encouraged to follow-up with their PCP within 1 week. All questions answered.  Note: Portions of this report may have been transcribed using voice recognition software. Every effort was made to ensure accuracy; however, inadvertent computerized transcription errors may still be present.        Final Clinical Impression(s) / ED Diagnoses Final diagnoses:  Viral URI    Rx / DC Orders ED Discharge Orders     None         Michelle Piper, Cordelia Poche 09/04/23 1610    Margarita Grizzle, MD 09/07/23 1600

## 2023-09-04 NOTE — Discharge Instructions (Signed)
 You have been seen today for your complaint of flulike symptoms. Your lab work was negative for flu, COVID, RSV. Your discharge medications include Alternate tylenol and ibuprofen for pain and fevers. You may alternate these every 4 hours. You may take up to 800 mg of ibuprofen at a time and up to 1000 mg of tylenol. Take Claritin and Flonase for congestion and runny nose. Use Chloraseptic spray or Cepacol lozenges for your sore throat Follow up with: Your primary care provider in 1 week if needed Please seek immediate medical care if you develop any of the following symptoms: You have shortness of breath that gets worse. You have severe or persistent: Headache. Ear pain. Sinus pain. Chest pain. You have chronic lung disease along with any of the following: Making high-pitched whistling sounds when you breathe, most often when you breathe out (wheezing). Prolonged cough (more than 14 days). Coughing up blood. A change in your usual mucus. You have a stiff neck. You have changes in your: Vision. Hearing. Thinking. Mood. At this time there does not appear to be the presence of an emergent medical condition, however there is always the potential for conditions to change. Please read and follow the below instructions.  Do not take your medicine if  develop an itchy rash, swelling in your mouth or lips, or difficulty breathing; call 911 and seek immediate emergency medical attention if this occurs.  You may review your lab tests and imaging results in their entirety on your MyChart account.  Please discuss all results of fully with your primary care provider and other specialist at your follow-up visit.  Note: Portions of this text may have been transcribed using voice recognition software. Every effort was made to ensure accuracy; however, inadvertent computerized transcription errors may still be present.

## 2023-09-04 NOTE — ED Triage Notes (Signed)
 C/o bodyaches, congestion, and runny nose x 4 days. Denies fevers or SHOB.

## 2023-09-27 ENCOUNTER — Emergency Department (HOSPITAL_BASED_OUTPATIENT_CLINIC_OR_DEPARTMENT_OTHER)
Admission: EM | Admit: 2023-09-27 | Discharge: 2023-09-28 | Disposition: A | Discharge status: Home / Self Care | Attending: Emergency Medicine | Admitting: Emergency Medicine

## 2023-09-27 ENCOUNTER — Encounter (HOSPITAL_BASED_OUTPATIENT_CLINIC_OR_DEPARTMENT_OTHER): Payer: Self-pay | Admitting: Emergency Medicine

## 2023-09-27 ENCOUNTER — Emergency Department (HOSPITAL_BASED_OUTPATIENT_CLINIC_OR_DEPARTMENT_OTHER)

## 2023-09-27 DIAGNOSIS — K5900 Constipation, unspecified: Secondary | ICD-10-CM | POA: Diagnosis not present

## 2023-09-27 DIAGNOSIS — R1030 Lower abdominal pain, unspecified: Secondary | ICD-10-CM | POA: Diagnosis present

## 2023-09-27 LAB — COMPREHENSIVE METABOLIC PANEL WITH GFR
ALT: 13 U/L (ref 0–44)
AST: 17 U/L (ref 15–41)
Albumin: 4.2 g/dL (ref 3.5–5.0)
Alkaline Phosphatase: 71 U/L (ref 38–126)
Anion gap: 5 (ref 5–15)
BUN: 8 mg/dL (ref 6–20)
CO2: 27 mmol/L (ref 22–32)
Calcium: 9.1 mg/dL (ref 8.9–10.3)
Chloride: 106 mmol/L (ref 98–111)
Creatinine, Ser: 0.89 mg/dL (ref 0.44–1.00)
GFR, Estimated: >60 mL/min (ref >60)
Glucose, Bld: 88 mg/dL (ref 70–99)
Potassium: 4.1 mmol/L (ref 3.5–5.1)
Sodium: 138 mmol/L (ref 135–145)
Total Bilirubin: 0.3 mg/dL (ref 0.0–1.2)
Total Protein: 6.6 g/dL (ref 6.5–8.1)

## 2023-09-27 LAB — LIPASE, BLOOD: Lipase: 39 U/L (ref 11–51)

## 2023-09-27 LAB — HCG, SERUM, QUALITATIVE: Preg, Serum: NEGATIVE

## 2023-09-27 LAB — CBC
HCT: 32.5 % — ABNORMAL LOW (ref 36.0–46.0)
Hemoglobin: 10.6 g/dL — ABNORMAL LOW (ref 12.0–15.0)
MCH: 25.2 pg — ABNORMAL LOW (ref 26.0–34.0)
MCHC: 32.6 g/dL (ref 30.0–36.0)
MCV: 77.4 fL — ABNORMAL LOW (ref 80.0–100.0)
Platelets: 309 K/uL (ref 150–400)
RBC: 4.2 MIL/uL (ref 3.87–5.11)
RDW: 17.2 % — ABNORMAL HIGH (ref 11.5–15.5)
WBC: 8.4 K/uL (ref 4.0–10.5)
nRBC: 0 % (ref 0.0–0.2)

## 2023-09-27 MED ORDER — ONDANSETRON 4 MG PO TBDP
4.0000 mg | ORAL_TABLET | Freq: Once | ORAL | Status: AC
  Administered 2023-09-27: 4 mg via ORAL
  Filled 2023-09-27: qty 1

## 2023-09-27 MED ORDER — IOHEXOL 300 MG/ML  SOLN
100.0000 mL | Freq: Once | INTRAMUSCULAR | Status: AC | PRN
  Administered 2023-09-27: 100 mL via INTRAVENOUS

## 2023-09-27 NOTE — ED Triage Notes (Signed)
 Pt arrived from home with cramping abdominal pain/back pain since last Thursday. Worsening today. Pt states she is 2 days past her menstrual cycle. N/V since yesterday. Difficulty with BMs. No c/o bleeding.

## 2023-09-28 NOTE — Discharge Instructions (Signed)
 To relieve constipation via 232 ounce Gatorade's place half a container of MiraLAX in each bottle.  Drink 8 ounces of Gatorade every hour until you have had 2 good bowel movements.  Increase the fiber in your diet.  Make sure that you are drinking plenty of water.  Use MiraLAX as needed when issues with constipation began.  Follow-up with your physician for recheck.

## 2023-09-28 NOTE — ED Provider Notes (Signed)
 [place] EMERGENCY DEPARTMENT AT [facility] Provider Note   CSN: [id number] Arrival date & time: 09/27/23  1812     History  No chief complaint on file.   Heidi Martin is a 23 y.o. female.  Patient complains of abdominal pain.  Patient reports that she has had cramping in her back and her lower abdomen for the past week.  Patient reports that the cramping is getting worse.  Patient reports that she took 2 doses of MiraLAX to see if this would help without any relief.  Patient denies any gynecologic symptoms she has not had any vaginal discharge she denies any STD risk patient states she has not had any urinary symptoms.  Patient denies any fever or chills.  Patient reports that she is nauseated.  The history is provided by the patient. No language interpreter was used.       Home Medications Prior to Admission medications   Medication Sig Start Date End Date Taking? Authorizing Provider  aspirin EC 325 MG tablet Take 325 mg by mouth daily as needed for mild pain (or headaches).    [provider]  celecoxib (CELEBREX) 200 MG capsule Take 1 capsule (200 mg total) by mouth 2 (two) times daily. 03/19/23   Harris, Abigail, PA-C  ESTARYLLA 0.25-35 MG-MCG tablet Take 1 tablet by mouth daily. 03/15/23   [provider]  etonogestrel-ethinyl estradiol (NUVARING) 0.12-0.015 MG/24HR vaginal ring Insert vaginally and leave in place for 3 consecutive weeks, then remove for 1 week. Patient not taking: Reported on 03/18/2023 01/09/23   Milas Hock, MD  ferrous sulfate 325 (65 FE) MG tablet Take 1 tablet (325 mg total) by mouth every other day. Patient not taking: Reported on 03/19/2023 12/05/22   Brand Males, CNM  hyoscyamine (LEVSIN/SL) 0.125 MG SL tablet Place 1 tablet (0.125 mg total) under the tongue every 6 (six) hours as needed for cramping (pain). Up to 1.25 mg daily 03/19/23   Arthor Captain, PA-C  ibuprofen (ADVIL) 600 MG tablet Take 1 tablet (600 mg  total) by mouth every 6 (six) hours. Patient not taking: Reported on 03/18/2023 12/04/22   Brand Males, CNM  megestrol (MEGACE) 40 MG tablet Take 1 tablet (40 mg total) by mouth 2 (two) times daily. Can increase to two tablets twice a day in the event of heavy bleeding Patient not taking: Reported on 03/18/2023 01/30/23   Lorriane Shire, MD  ondansetron (ZOFRAN-ODT) 4 MG disintegrating tablet Take 1 tablet (4 mg total) by mouth every 8 (eight) hours as needed for nausea or vomiting. Patient not taking: Reported on 03/19/2023 03/18/23   Judithann Sheen, PA  oxyCODONE-acetaminophen (PERCOCET) 5-325 MG tablet Take 1 tablet by mouth every 6 (six) hours as needed. 03/18/23   Charlynne Pander, MD  pantoprazole (PROTONIX) 20 MG tablet Take 1 tablet (20 mg total) by mouth daily. 06/05/23 07/05/23  Gareth Eagle, PA-C  Prenatal Vit-Min-FA-Fish Oil ([employer] PRENATAL GUMMY) 0.4-113.5 MG CHEW Chew 1 each by mouth daily after breakfast. Patient not taking: Reported on 03/18/2023 11/25/22   Bernerd Limbo, CNM  prochlorperazine (COMPAZINE) 10 MG tablet Take 1 tablet (10 mg total) by mouth 2 (two) times daily as needed for nausea or vomiting. 03/19/23   Arthor Captain, PA-C  TYLENOL 325 MG CAPS Take 325-650 mg by mouth every 6 (six) hours as needed (for pain or headaches).    [provider]  dicyclomine (BENTYL) 20 MG tablet Take 1 tablet (20 mg total) by  mouth 2 (two) times daily. Patient not taking: No sig reported 11/14/19 02/14/20  Renne Crigler, PA-C      Allergies    Milk [milk (cow)]    Review of Systems   Review of Systems  All other systems reviewed and are negative.   Physical Exam Updated Vital Signs BP 113/73   Pulse 66   Temp 98 F (36.7 C)   Resp 18   Ht 5\' 7"  (1.702 m)   Wt 88.5 kg   LMP 08/25/2023   SpO2 100%   BMI 30.54 kg/m  Physical Exam Vitals and nursing note reviewed.  Constitutional:      Appearance: She is well-developed.  HENT:     Head: Normocephalic.      Right Ear: Tympanic membrane normal.     Left Ear: Tympanic membrane normal.     Nose: Nose normal.     Mouth/Throat:     Mouth: Mucous membranes are moist.  Cardiovascular:     Rate and Rhythm: Normal rate and regular rhythm.  Pulmonary:     Effort: Pulmonary effort is normal.  Abdominal:     General: There is no distension.     Palpations: Abdomen is soft.     Tenderness: There is abdominal tenderness.  Musculoskeletal:        General: Normal range of motion.     Cervical back: Normal range of motion.  Skin:    General: Skin is warm.  Neurological:     General: No focal deficit present.     Mental Status: She is alert and oriented to person, place, and time.  Psychiatric:        Mood and Affect: Mood normal.     ED Results / Procedures / Treatments   Labs (all labs ordered are listed, but only abnormal results are displayed) Labs Reviewed  CBC - Abnormal; Notable for the following components:      Result Value   Hemoglobin 10.6 (*)    HCT 32.5 (*)    MCV 77.4 (*)    MCH 25.2 (*)    RDW 17.2 (*)    All other components within normal limits  LIPASE, BLOOD  COMPREHENSIVE METABOLIC PANEL WITH GFR  HCG, SERUM, QUALITATIVE    EKG None  Radiology CT ABDOMEN PELVIS W CONTRAST Result Date: 09/27/2023 CLINICAL DATA:  Acute abdominal pain for several days, initial encounter EXAM: CT ABDOMEN AND PELVIS WITH CONTRAST TECHNIQUE: Multidetector CT imaging of the abdomen and pelvis was performed using the standard protocol following bolus administration of intravenous contrast. RADIATION DOSE REDUCTION: This exam was performed according to the departmental dose-optimization program which includes automated exposure control, adjustment of the mA and/or kV according to patient size and/or use of iterative reconstruction technique. CONTRAST:  OMNIPAQUE IOHEXOL 300 MG/ML  SOLN COMPARISON:  03/18/2023 FINDINGS: Lower chest: No acute abnormality. Hepatobiliary: The known caudate  lobe lesion is less well appreciated due to the timing of the contrast bolus. The overall appearance is stable consistent with that seen on prior MRI. Gallbladder is decompressed. Pancreas: Unremarkable. No pancreatic ductal dilatation or surrounding inflammatory changes. Spleen: Normal in size without focal abnormality. Adrenals/Urinary Tract: Adrenal glands are within normal limits. Kidneys demonstrate a normal enhancement pattern bilaterally. No obstructive changes are seen. The bladder is partially distended. Stomach/Bowel: No obstructive or inflammatory changes of the colon are noted. The appendix is not well visualized. No inflammatory changes are identified. Small bowel and stomach are within normal limits. Vascular/Lymphatic: No significant vascular  findings are present. No enlarged abdominal or pelvic lymph nodes. Reproductive: Uterus and bilateral adnexa are unremarkable. Other: No abdominal wall hernia or abnormality. No abdominopelvic ascites. Musculoskeletal: No acute or significant osseous findings. 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 The overall contour is stable from that prior MRI examination. No acute abnormality to correspond with the given clinical history. Electronically Signed   By: Alcide Clever M.D.   On: 09/27/2023 23:26    Procedures Procedures    Medications Ordered in ED Medications  ondansetron (ZOFRAN-ODT) disintegrating tablet 4 mg (4 mg Oral Given 09/27/23 2234)  iohexol (OMNIPAQUE) 300 MG/ML solution 100 mL (100 mLs Intravenous Contrast Given 09/27/23 2309)    ED Course/ Medical Decision Making/ A&P                                 Medical Decision Making Patient complains of abdominal pain for the past week.  Patient reports pain is getting worse.  Patient does feel like she has had some constipation.  Amount and/or Complexity of Data Reviewed Labs: ordered. Decision-making details  documented in ED Course.    Details: Labs ordered reviewed and interpreted patient is unable to obtain a urine.  Serum pregnancy is negative. Radiology: ordered.    Details: CT abdomen and pelvis showed no acute findings.  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  Patient is aware of this and understands her follow-up plan  Risk Prescription drug management. Risk Details: I discussed symptoms with patient.  I am suspicious that this may be due to constipation.  Patient is advised on MiraLAX bowel regiment.  Patient is advised to follow-up with her primary care physician for recheck.           Final Clinical Impression(s) / ED Diagnoses Final diagnoses:  Constipation, unspecified constipation type    Rx / DC Orders ED Discharge Orders     None     An After Visit Summary was printed and given to the patient.     Osie Cheeks 09/28/23 1913    Maia Plan, MD 10/01/23 [phone]

## 2023-10-23 ENCOUNTER — Emergency Department (HOSPITAL_BASED_OUTPATIENT_CLINIC_OR_DEPARTMENT_OTHER)
Admission: EM | Admit: 2023-10-23 | Discharge: 2023-10-23 | Disposition: A | Discharge status: Home / Self Care | Attending: Emergency Medicine | Admitting: Emergency Medicine

## 2023-10-23 ENCOUNTER — Other Ambulatory Visit: Payer: Self-pay

## 2023-10-23 ENCOUNTER — Encounter (HOSPITAL_BASED_OUTPATIENT_CLINIC_OR_DEPARTMENT_OTHER): Payer: Self-pay | Admitting: Emergency Medicine

## 2023-10-23 DIAGNOSIS — H9201 Otalgia, right ear: Secondary | ICD-10-CM | POA: Insufficient documentation

## 2023-10-23 DIAGNOSIS — J029 Acute pharyngitis, unspecified: Secondary | ICD-10-CM | POA: Insufficient documentation

## 2023-10-23 DIAGNOSIS — Z7982 Long term (current) use of aspirin: Secondary | ICD-10-CM | POA: Insufficient documentation

## 2023-10-23 LAB — RESP PANEL BY RT-PCR (RSV, FLU A&B, COVID)  RVPGX2
Influenza A by PCR: NEGATIVE
Influenza B by PCR: NEGATIVE
Resp Syncytial Virus by PCR: NEGATIVE
SARS Coronavirus 2 by RT PCR: NEGATIVE

## 2023-10-23 LAB — GROUP A STREP BY PCR: Group A Strep by PCR: NOT DETECTED

## 2023-10-23 NOTE — ED Provider Triage Note (Signed)
 Emergency Medicine Provider Triage Evaluation Note  Heidi Martin , a 23 y.o. female  was evaluated in triage.  Pt complains of sore throat.  Review of Systems  Positive: ST, right ear pain and right frontal HA since earlier today Negative: Nausea, vomiting, fever, cough, SoB  Physical Exam  BP 117/79 (BP Location: Right Arm)   Pulse 73   Temp 98.1 F (36.7 C) (Oral)   Resp 16   Ht 5\' 7"  (1.702 m)   Wt 90.7 kg   LMP 08/25/2023   SpO2 100%   BMI 31.32 kg/m  Gen:   Awake, no distress   Resp:  Normal effort  MSK:   Moves extremities without difficulty Other:  Oropharynx erythematous bilaterally, uvula midline  Medical Decision Making  Medically screening exam initiated at 8:35 PM.  Appropriate orders placed.  OLYVIA GOPAL was informed that the remainder of the evaluation will be completed by another provider, this initial triage assessment does not replace that evaluation, and the importance of remaining in the ED until their evaluation is complete.  One day of ss/sxs as above. Well appearing. Afebrile.    Mandy Second, PA-C 10/23/23 2036

## 2023-10-23 NOTE — ED Provider Notes (Signed)
 [place] EMERGENCY DEPARTMENT AT [facility] Provider Note   CSN: [id number] Arrival date & time: 10/23/23  2013     History  Chief Complaint  Patient presents with   Sore Throat    Heidi Martin is a 23 y.o. female.  Patient to ED with sore throat and right ear pain for the past 1 day. No known fever. No nausea, vomiting. She denies significant congestion. No cough.   The history is provided by the patient. No language interpreter was used.  Sore Throat       Home Medications Prior to Admission medications   Medication Sig Start Date End Date Taking? Authorizing Provider  aspirin EC 325 MG tablet Take 325 mg by mouth daily as needed for mild pain (or headaches).    [provider]  celecoxib  (CELEBREX ) 200 MG capsule Take 1 capsule (200 mg total) by mouth 2 (two) times daily. 03/19/23   Harris, Abigail, PA-C  ESTARYLLA 0.25-35 MG-MCG tablet Take 1 tablet by mouth daily. 03/15/23   [provider]  etonogestrel -ethinyl estradiol  (NUVARING) 0.12-0.015 MG/24HR vaginal ring Insert vaginally and leave in place for 3 consecutive weeks, then remove for 1 week. Patient not taking: Reported on 03/18/2023 01/09/23   Lacey Pian, MD  ferrous sulfate  325 (65 FE) MG tablet Take 1 tablet (325 mg total) by mouth every other day. Patient not taking: Reported on 03/19/2023 12/05/22   Alric Jensen, CNM  hyoscyamine  (LEVSIN/SL) 0.125 MG SL tablet Place 1 tablet (0.125 mg total) under the tongue every 6 (six) hours as needed for cramping (pain). Up to 1.25 mg daily 03/19/23   Harris, Abigail, PA-C  ibuprofen  (ADVIL ) 600 MG tablet Take 1 tablet (600 mg total) by mouth every 6 (six) hours. Patient not taking: Reported on 03/18/2023 12/04/22   Alric Jensen, CNM  megestrol  (MEGACE ) 40 MG tablet Take 1 tablet (40 mg total) by mouth 2 (two) times daily. Can increase to two tablets twice a day in the event of heavy bleeding Patient not taking: Reported on 03/18/2023  01/30/23   Ajewole, Christana, MD  ondansetron  (ZOFRAN -ODT) 4 MG disintegrating tablet Take 1 tablet (4 mg total) by mouth every 8 (eight) hours as needed for nausea or vomiting. Patient not taking: Reported on 03/19/2023 03/18/23   Royann Cords, PA  oxyCODONE -acetaminophen  (PERCOCET) 5-325 MG tablet Take 1 tablet by mouth every 6 (six) hours as needed. 03/18/23   Dalene Duck, MD  pantoprazole  (PROTONIX ) 20 MG tablet Take 1 tablet (20 mg total) by mouth daily. 06/05/23 07/05/23  Janalee Mcmurray, PA-C  Prenatal Vit-Min-FA-Fish Oil ([employer] PRENATAL GUMMY) 0.4-113.5 MG CHEW Chew 1 each by mouth daily after breakfast. Patient not taking: Reported on 03/18/2023 11/25/22   Walker, Jamilla R, CNM  prochlorperazine  (COMPAZINE ) 10 MG tablet Take 1 tablet (10 mg total) by mouth 2 (two) times daily as needed for nausea or vomiting. 03/19/23   Tama Fails, PA-C  TYLENOL  325 MG CAPS Take 325-650 mg by mouth every 6 (six) hours as needed (for pain or headaches).    [provider]  dicyclomine  (BENTYL ) 20 MG tablet Take 1 tablet (20 mg total) by mouth 2 (two) times daily. Patient not taking: No sig reported 11/14/19 02/14/20  Lyna Sandhoff, PA-C      Allergies    Milk [milk (cow)]    Review of Systems   Review of Systems  Physical Exam Updated Vital Signs BP 117/79 (BP Location: Right Arm)   Pulse 73  Temp 98.1 F (36.7 C) (Oral)   Resp 16   Ht 5\' 7"  (1.702 m)   Wt 90.7 kg   LMP 08/25/2023   SpO2 100%   BMI 31.32 kg/m  Physical Exam Constitutional:      General: She is not in acute distress.    Appearance: She is well-developed. She is not ill-appearing.  HENT:     Right Ear: Tympanic membrane normal.     Nose: No congestion.     Mouth/Throat:     Mouth: Mucous membranes are moist.     Pharynx: Uvula midline. Posterior oropharyngeal erythema present.     Tonsils: No tonsillar exudate or tonsillar abscesses.  Cardiovascular:     Rate and Rhythm: Normal rate.  Pulmonary:      Effort: Pulmonary effort is normal.     Breath sounds: No rhonchi or rales.  Musculoskeletal:        General: Normal range of motion.     Cervical back: Normal range of motion.  Lymphadenopathy:     Cervical: No cervical adenopathy.  Skin:    General: Skin is warm and dry.  Neurological:     Mental Status: She is alert and oriented to person, place, and time.     ED Results / Procedures / Treatments   Labs (all labs ordered are listed, but only abnormal results are displayed) Labs Reviewed  GROUP A STREP BY PCR  RESP PANEL BY RT-PCR (RSV, FLU A&B, COVID)  [id number]   Results for orders placed or performed during the hospital encounter of 10/23/23  Group A Strep by PCR   Collection Time: 10/23/23  8:31 PM   Specimen: Throat; Sterile Swab  Result Value Ref Range   Group A Strep by PCR NOT DETECTED NOT DETECTED  Resp panel by RT-PCR (RSV, Flu A&B, Covid) Throat   Collection Time: 10/23/23  8:31 PM   Specimen: Throat; Nasal Swab  Result Value Ref Range   SARS Coronavirus 2 by RT PCR NEGATIVE NEGATIVE   Influenza A by PCR NEGATIVE NEGATIVE   Influenza B by PCR NEGATIVE NEGATIVE   Resp Syncytial Virus by PCR NEGATIVE NEGATIVE    EKG None  Radiology No results found.  Procedures Procedures    Medications Ordered in ED Medications - No data to display  ED Course/ Medical Decision Making/ A&P Clinical Course as of 10/23/23 2220  Tue Oct 23, 2023  2219 One day of sore throat and right ear pain. No fever, vomiting or other symptoms. Viral panel negative. Strep negative. Likely viral etiology requiring supportive care.  [SU]    Clinical Course User Index [SU] Mandy Second, PA-C                                 Medical Decision Making          Final Clinical Impression(s) / ED Diagnoses Final diagnoses:  Pharyngitis, unspecified etiology    Rx / DC Orders ED Discharge Orders     None         Rama Burkitt 10/23/23 2220    Afton Horse  T, DO 10/27/23 1549

## 2023-10-23 NOTE — Discharge Instructions (Signed)
 Tylenol  and/or ibuprofen  as needed. Drink lots of fluids, and rest.   Follow up with your doctor as needed  if symptoms persist.

## 2023-10-23 NOTE — ED Triage Notes (Signed)
 Patient reports sore throat, headache, and right earache since yesterday. Reports loss of taste. Able to swallow and drink.

## 2023-10-27 ENCOUNTER — Other Ambulatory Visit: Payer: Self-pay | Admitting: Obstetrics and Gynecology

## 2023-10-27 DIAGNOSIS — O26899 Other specified pregnancy related conditions, unspecified trimester: Secondary | ICD-10-CM

## 2023-11-12 ENCOUNTER — Other Ambulatory Visit: Payer: Self-pay

## 2023-11-12 ENCOUNTER — Emergency Department (HOSPITAL_COMMUNITY)
Admission: EM | Admit: 2023-11-12 | Discharge: 2023-11-13 | Discharge status: Against Medical Advice | Attending: Emergency Medicine | Admitting: Emergency Medicine

## 2023-11-12 ENCOUNTER — Encounter (HOSPITAL_COMMUNITY): Payer: Self-pay | Admitting: Emergency Medicine

## 2023-11-12 DIAGNOSIS — Z5321 Procedure and treatment not carried out due to patient leaving prior to being seen by health care provider: Secondary | ICD-10-CM | POA: Diagnosis not present

## 2023-11-12 DIAGNOSIS — R519 Headache, unspecified: Secondary | ICD-10-CM | POA: Insufficient documentation

## 2023-11-12 DIAGNOSIS — J029 Acute pharyngitis, unspecified: Secondary | ICD-10-CM | POA: Insufficient documentation

## 2023-11-12 DIAGNOSIS — R5383 Other fatigue: Secondary | ICD-10-CM | POA: Diagnosis not present

## 2023-11-12 LAB — GROUP A STREP BY PCR: Group A Strep by PCR: NOT DETECTED

## 2023-11-12 LAB — RESP PANEL BY RT-PCR (RSV, FLU A&B, COVID)  RVPGX2
Influenza A by PCR: NEGATIVE
Influenza B by PCR: NEGATIVE
Resp Syncytial Virus by PCR: NEGATIVE
SARS Coronavirus 2 by RT PCR: NEGATIVE

## 2023-11-12 MED ORDER — IBUPROFEN 800 MG PO TABS
800.0000 mg | ORAL_TABLET | Freq: Once | ORAL | Status: AC
  Administered 2023-11-12: 800 mg via ORAL
  Filled 2023-11-12: qty 1

## 2023-11-12 NOTE — ED Triage Notes (Signed)
 Pt c/o sore throat, headache, and fatigue that started today. Last tylenol  was 1615.

## 2023-11-13 NOTE — ED Notes (Signed)
Pt did not answer when called to a room.

## 2023-11-14 ENCOUNTER — Emergency Department (HOSPITAL_COMMUNITY)
Admission: EM | Admit: 2023-11-14 | Discharge: 2023-11-14 | Disposition: A | Discharge status: Home / Self Care | Attending: Emergency Medicine | Admitting: Emergency Medicine

## 2023-11-14 ENCOUNTER — Emergency Department (HOSPITAL_COMMUNITY)

## 2023-11-14 ENCOUNTER — Encounter (HOSPITAL_COMMUNITY): Payer: Self-pay

## 2023-11-14 ENCOUNTER — Other Ambulatory Visit: Payer: Self-pay

## 2023-11-14 DIAGNOSIS — H9201 Otalgia, right ear: Secondary | ICD-10-CM | POA: Insufficient documentation

## 2023-11-14 DIAGNOSIS — J029 Acute pharyngitis, unspecified: Secondary | ICD-10-CM | POA: Insufficient documentation

## 2023-11-14 DIAGNOSIS — D72829 Elevated white blood cell count, unspecified: Secondary | ICD-10-CM | POA: Diagnosis not present

## 2023-11-14 DIAGNOSIS — Z7982 Long term (current) use of aspirin: Secondary | ICD-10-CM | POA: Diagnosis not present

## 2023-11-14 LAB — CBC WITH DIFFERENTIAL/PLATELET
Abs Immature Granulocytes: 0.09 10*3/uL — ABNORMAL HIGH (ref 0.00–0.07)
Basophils Absolute: 0.1 10*3/uL (ref 0.0–0.1)
Basophils Relative: 0 %
Eosinophils Absolute: 0 10*3/uL (ref 0.0–0.5)
Eosinophils Relative: 0 %
HCT: 36.6 % (ref 36.0–46.0)
Hemoglobin: 11.7 g/dL — ABNORMAL LOW (ref 12.0–15.0)
Immature Granulocytes: 0 %
Lymphocytes Relative: 7 %
Lymphs Abs: 1.5 10*3/uL (ref 0.7–4.0)
MCH: 24.6 pg — ABNORMAL LOW (ref 26.0–34.0)
MCHC: 32 g/dL (ref 30.0–36.0)
MCV: 76.9 fL — ABNORMAL LOW (ref 80.0–100.0)
Monocytes Absolute: 1.6 10*3/uL — ABNORMAL HIGH (ref 0.1–1.0)
Monocytes Relative: 8 %
Neutro Abs: 17.3 10*3/uL — ABNORMAL HIGH (ref 1.7–7.7)
Neutrophils Relative %: 85 %
Platelets: 287 10*3/uL (ref 150–400)
RBC: 4.76 MIL/uL (ref 3.87–5.11)
RDW: 16.3 % — ABNORMAL HIGH (ref 11.5–15.5)
WBC: 20.5 10*3/uL — ABNORMAL HIGH (ref 4.0–10.5)
nRBC: 0 % (ref 0.0–0.2)

## 2023-11-14 LAB — BASIC METABOLIC PANEL WITH GFR
Anion gap: 11 (ref 5–15)
BUN: <5 mg/dL — ABNORMAL LOW (ref 6–20)
CO2: 23 mmol/L (ref 22–32)
Calcium: 8.8 mg/dL — ABNORMAL LOW (ref 8.9–10.3)
Chloride: 103 mmol/L (ref 98–111)
Creatinine, Ser: 0.68 mg/dL (ref 0.44–1.00)
GFR, Estimated: >60 mL/min (ref >60)
Glucose, Bld: 114 mg/dL — ABNORMAL HIGH (ref 70–99)
Potassium: 3.6 mmol/L (ref 3.5–5.1)
Sodium: 137 mmol/L (ref 135–145)

## 2023-11-14 LAB — MONONUCLEOSIS SCREEN: Mono Screen: NEGATIVE

## 2023-11-14 MED ORDER — IBUPROFEN 800 MG PO TABS
800.0000 mg | ORAL_TABLET | Freq: Once | ORAL | Status: AC
  Administered 2023-11-14: 800 mg via ORAL
  Filled 2023-11-14: qty 1

## 2023-11-14 MED ORDER — AMOXICILLIN 500 MG PO CAPS: 500.0000 mg | ORAL_CAPSULE | Freq: Three times a day (TID) | ORAL | 0 refills | Status: DC

## 2023-11-14 MED ORDER — IBUPROFEN 800 MG PO TABS: 800.0000 mg | ORAL_TABLET | Freq: Three times a day (TID) | ORAL | 0 refills | Status: AC | PRN

## 2023-11-14 NOTE — Discharge Instructions (Addendum)
 Take antibiotics as directed.  Return if any problems.  See your Physician for recheck in 1 week

## 2023-11-14 NOTE — ED Provider Notes (Signed)
 [place] EMERGENCY DEPARTMENT AT [facility]  Provider Note   CSN: [id number] Arrival date & time: 11/14/23  5284     History  Chief Complaint  Patient presents with   Generalized Body Aches    Heidi Martin is a 23 y.o. female.  Patient complains of a persistent sore throat and pain in her right ear.  Patient was seen 2 days ago and tested for flu COVID and strep.  Patient reports she had a fever earlier today.  Patient states that she took some Tylenol  at 2 AM.  Patient has not had anything for fever since.  Patient states that she does not have any medical problems.  She reports that her children have been sick.  Patient states that children were evaluated by primary care and have had negative viral testing.  Patient denies any nausea or vomiting she has not had any abdominal pain.  Patient denies any difficulty swallowing.  Patient reports she has an occasional cough.  Patient denies shortness of breath.        Home Medications Prior to Admission medications   Medication Sig Start Date End Date Taking? Authorizing Provider  amoxicillin  (AMOXIL ) 500 MG capsule Take 1 capsule (500 mg total) by mouth 3 (three) times daily. 11/14/23  Yes Imunique Samad K, PA-C  aspirin EC 325 MG tablet Take 325 mg by mouth daily as needed for mild pain (or headaches).    [provider]  celecoxib  (CELEBREX ) 200 MG capsule Take 1 capsule (200 mg total) by mouth 2 (two) times daily. 03/19/23   Harris, Abigail, PA-C  ESTARYLLA 0.25-35 MG-MCG tablet Take 1 tablet by mouth daily. 03/15/23   [provider]  hyoscyamine  (LEVSIN/SL) 0.125 MG SL tablet Place 1 tablet (0.125 mg total) under the tongue every 6 (six) hours as needed for cramping (pain). Up to 1.25 mg daily 03/19/23   Harris, Abigail, PA-C  ibuprofen  (ADVIL ) 800 MG tablet Take 1 tablet (800 mg total) by mouth every 8 (eight) hours as needed. 11/14/23  Yes Sandi Crosby, PA-C  pantoprazole  (PROTONIX ) 20 MG tablet Take  1 tablet (20 mg total) by mouth daily. 06/05/23 07/05/23  Robinson, John K, PA-C  prochlorperazine  (COMPAZINE ) 10 MG tablet Take 1 tablet (10 mg total) by mouth 2 (two) times daily as needed for nausea or vomiting. 03/19/23   Tama Fails, PA-C  TYLENOL  325 MG CAPS Take 325-650 mg by mouth every 6 (six) hours as needed (for pain or headaches).    [provider]  dicyclomine  (BENTYL ) 20 MG tablet Take 1 tablet (20 mg total) by mouth 2 (two) times daily. Patient not taking: No sig reported 11/14/19 02/14/20  Lyna Sandhoff, PA-C      Allergies    Milk [milk (cow)]    Review of Systems   Review of Systems  HENT:  Positive for ear pain and sore throat.   All other systems reviewed and are negative.   Physical Exam Updated Vital Signs BP 109/74 (BP Location: Right Arm)   Pulse 93   Temp 99.7 F (37.6 C) (Oral)   Resp 16   Ht 5\' 7"  (1.702 m)   Wt 90 kg   SpO2 99%   BMI 31.08 kg/m  Physical Exam Vitals reviewed.  Constitutional:      Appearance: Normal appearance.  HENT:     Left Ear: Tympanic membrane normal.     Ears:     Comments: Left tm erythematous, no bulging  Right tm normal  Mouth/Throat:     Mouth: Mucous membranes are moist.     Pharynx: Posterior oropharyngeal erythema present.     Comments: Erythema,  no exudate Eyes:     Pupils: Pupils are equal, round, and reactive to light.  Cardiovascular:     Rate and Rhythm: Normal rate.  Pulmonary:     Effort: Pulmonary effort is normal.  Musculoskeletal:        General: Normal range of motion.     Cervical back: Tenderness present.  Lymphadenopathy:     Cervical: No cervical adenopathy.  Skin:    General: Skin is warm.  Neurological:     General: No focal deficit present.     Mental Status: She is alert.  Psychiatric:        Mood and Affect: Mood normal.     ED Results / Procedures / Treatments   Labs (all labs ordered are listed, but only abnormal results are displayed) Labs Reviewed  CBC WITH  DIFFERENTIAL/PLATELET - Abnormal; Notable for the following components:      Result Value   WBC 20.5 (*)    Hemoglobin 11.7 (*)    MCV 76.9 (*)    MCH 24.6 (*)    RDW 16.3 (*)    Neutro Abs 17.3 (*)    Monocytes Absolute 1.6 (*)    Abs Immature Granulocytes 0.09 (*)    All other components within normal limits  BASIC METABOLIC PANEL WITH GFR - Abnormal; Notable for the following components:   Glucose, Bld 114 (*)    BUN <5 (*)    Calcium  8.8 (*)    All other components within normal limits  MONONUCLEOSIS SCREEN    EKG None  Radiology DG Chest Port 1 View Result Date: 11/14/2023 CLINICAL DATA:  Cough, fever. EXAM: PORTABLE CHEST 1 VIEW COMPARISON:  June 05, 2023. FINDINGS: The heart size and mediastinal contours are within normal limits. Both lungs are clear. The visualized skeletal structures are unremarkable. IMPRESSION: No active disease. Electronically Signed   By: Rosalene Colon M.D.   On: 11/14/2023 11:10    Procedures Procedures    Medications Ordered in ED Medications  ibuprofen  (ADVIL ) tablet 800 mg (800 mg Oral Given 11/14/23 1117)    ED Course/ Medical Decision Making/ A&P                                 Medical Decision Making Patient complains of pain in her right ear and a sore throat.  Amount and/or Complexity of Data Reviewed External Data Reviewed: labs.    Details: Labs from ED visit 2 days ago reviewed strep was negative flu and COVID were negative Labs: ordered. Decision-making details documented in ED Course.    Details: Labs ordered reviewed and interpreted.  Monotest is negative.  Patient does have a night elevated white blood cell count of 20. Radiology: ordered.  Risk Prescription drug management. Risk Details: Patient given 800 mg of ibuprofen .  Patient reports her ear and her throat feel better.  She is tolerating fluids.  Patient reexamined she appears well overall.  Patient reports the throat is still painful.  Options discussed with  patient.  Patient given a prescription for 800 mg ibuprofen  and amoxicillin .  She is advised to follow-up with her primary care physician for recheck in 1 week.  She is advised to return to the emergency department if symptoms worsen or change.  Patient is given  a note for her employer for out of work for the next 3 days           Final Clinical Impression(s) / ED Diagnoses Final diagnoses:  Pharyngitis, unspecified etiology    Rx / DC Orders ED Discharge Orders          Ordered    ibuprofen  (ADVIL ) 800 MG tablet  Every 8 hours PRN        11/14/23 1430    amoxicillin  (AMOXIL ) 500 MG capsule  3 times daily        11/14/23 1430           An After Visit Summary was printed and given to the patient.    Sandi Crosby, PA-C 11/14/23 1442    Teddi Favors, DO 11/15/23 [phone]

## 2023-11-14 NOTE — ED Triage Notes (Addendum)
 Patient BIB [facility] from home. Whole family has been sick for 1 week. Fever, cough, headache, throat burning, bilateral ear pain. Seen 2 days ago for the same.

## 2023-12-13 ENCOUNTER — Other Ambulatory Visit: Payer: Self-pay

## 2023-12-13 ENCOUNTER — Encounter (HOSPITAL_BASED_OUTPATIENT_CLINIC_OR_DEPARTMENT_OTHER): Payer: Self-pay | Admitting: Emergency Medicine

## 2023-12-13 ENCOUNTER — Emergency Department (HOSPITAL_BASED_OUTPATIENT_CLINIC_OR_DEPARTMENT_OTHER)

## 2023-12-13 ENCOUNTER — Emergency Department (HOSPITAL_BASED_OUTPATIENT_CLINIC_OR_DEPARTMENT_OTHER)
Admission: EM | Admit: 2023-12-13 | Discharge: 2023-12-14 | Disposition: A | Discharge status: Home / Self Care | Attending: Emergency Medicine | Admitting: Emergency Medicine

## 2023-12-13 DIAGNOSIS — B9689 Other specified bacterial agents as the cause of diseases classified elsewhere: Secondary | ICD-10-CM

## 2023-12-13 DIAGNOSIS — R1030 Lower abdominal pain, unspecified: Secondary | ICD-10-CM | POA: Diagnosis present

## 2023-12-13 DIAGNOSIS — R102 Pelvic and perineal pain: Secondary | ICD-10-CM

## 2023-12-13 DIAGNOSIS — D649 Anemia, unspecified: Secondary | ICD-10-CM | POA: Diagnosis not present

## 2023-12-13 DIAGNOSIS — N3000 Acute cystitis without hematuria: Secondary | ICD-10-CM | POA: Diagnosis not present

## 2023-12-13 DIAGNOSIS — Z7982 Long term (current) use of aspirin: Secondary | ICD-10-CM | POA: Insufficient documentation

## 2023-12-13 DIAGNOSIS — N760 Acute vaginitis: Secondary | ICD-10-CM | POA: Insufficient documentation

## 2023-12-13 LAB — COMPREHENSIVE METABOLIC PANEL WITH GFR
ALT: 16 U/L (ref 0–44)
AST: 17 U/L (ref 15–41)
Albumin: 4.1 g/dL (ref 3.5–5.0)
Alkaline Phosphatase: 86 U/L (ref 38–126)
Anion gap: 12 (ref 5–15)
BUN: 7 mg/dL (ref 6–20)
CO2: 26 mmol/L (ref 22–32)
Calcium: 9.7 mg/dL (ref 8.9–10.3)
Chloride: 103 mmol/L (ref 98–111)
Creatinine, Ser: 0.92 mg/dL (ref 0.44–1.00)
GFR, Estimated: >60 mL/min
Glucose, Bld: 79 mg/dL (ref 70–99)
Potassium: 3.6 mmol/L (ref 3.5–5.1)
Sodium: 141 mmol/L (ref 135–145)
Total Bilirubin: <0.2 mg/dL (ref 0.0–1.2)
Total Protein: 7.2 g/dL (ref 6.5–8.1)

## 2023-12-13 LAB — CBC WITH DIFFERENTIAL/PLATELET
Abs Immature Granulocytes: 0.03 10*3/uL (ref 0.00–0.07)
Basophils Absolute: 0 10*3/uL (ref 0.0–0.1)
Basophils Relative: 1 %
Eosinophils Absolute: 0.1 10*3/uL (ref 0.0–0.5)
Eosinophils Relative: 2 %
HCT: 34.5 % — ABNORMAL LOW (ref 36.0–46.0)
Hemoglobin: 11 g/dL — ABNORMAL LOW (ref 12.0–15.0)
Immature Granulocytes: 0 %
Lymphocytes Relative: 31 %
Lymphs Abs: 2.7 10*3/uL (ref 0.7–4.0)
MCH: 24.2 pg — ABNORMAL LOW (ref 26.0–34.0)
MCHC: 31.9 g/dL (ref 30.0–36.0)
MCV: 76 fL — ABNORMAL LOW (ref 80.0–100.0)
Monocytes Absolute: 0.9 10*3/uL (ref 0.1–1.0)
Monocytes Relative: 10 %
Neutro Abs: 4.9 10*3/uL (ref 1.7–7.7)
Neutrophils Relative %: 56 %
Platelets: 286 10*3/uL (ref 150–400)
RBC: 4.54 MIL/uL (ref 3.87–5.11)
RDW: 15.9 % — ABNORMAL HIGH (ref 11.5–15.5)
WBC: 8.7 10*3/uL (ref 4.0–10.5)
nRBC: 0 % (ref 0.0–0.2)

## 2023-12-13 LAB — WET PREP, GENITAL
Sperm: NONE SEEN
Trich, Wet Prep: NONE SEEN
WBC, Wet Prep HPF POC: >=10 — AB (ref <10)
Yeast Wet Prep HPF POC: NONE SEEN

## 2023-12-13 LAB — URINALYSIS, ROUTINE W REFLEX MICROSCOPIC
Bacteria, UA: NONE SEEN
Bilirubin Urine: NEGATIVE
Glucose, UA: NEGATIVE mg/dL
Ketones, ur: NEGATIVE mg/dL
Nitrite: NEGATIVE
Protein, ur: NEGATIVE mg/dL
Specific Gravity, Urine: 1.01 (ref 1.005–1.030)
WBC, UA: >50 WBC/hpf (ref 0–5)
pH: 7 (ref 5.0–8.0)

## 2023-12-13 LAB — PREGNANCY, URINE: Preg Test, Ur: NEGATIVE

## 2023-12-13 MED ORDER — DOXYCYCLINE HYCLATE 100 MG PO TABS
100.0000 mg | ORAL_TABLET | Freq: Once | ORAL | Status: AC
  Administered 2023-12-14: 100 mg via ORAL
  Filled 2023-12-13: qty 1

## 2023-12-13 MED ORDER — METRONIDAZOLE 500 MG PO TABS: 500.0000 mg | ORAL_TABLET | Freq: Two times a day (BID) | ORAL | 0 refills | Status: DC

## 2023-12-13 MED ORDER — IOHEXOL 300 MG/ML  SOLN
100.0000 mL | Freq: Once | INTRAMUSCULAR | Status: AC | PRN
  Administered 2023-12-13: 100 mL via INTRAVENOUS

## 2023-12-13 MED ORDER — KETOROLAC TROMETHAMINE 15 MG/ML IJ SOLN
15.0000 mg | Freq: Once | INTRAMUSCULAR | Status: AC
  Administered 2023-12-13: 15 mg via INTRAVENOUS
  Filled 2023-12-13: qty 1

## 2023-12-13 MED ORDER — DOXYCYCLINE HYCLATE 100 MG PO CAPS: 100.0000 mg | ORAL_CAPSULE | Freq: Two times a day (BID) | ORAL | 0 refills | Status: DC

## 2023-12-13 MED ORDER — METRONIDAZOLE 500 MG PO TABS
500.0000 mg | ORAL_TABLET | Freq: Once | ORAL | Status: AC
  Administered 2023-12-14: 500 mg via ORAL
  Filled 2023-12-13: qty 1

## 2023-12-13 MED ORDER — CEFTRIAXONE SODIUM 500 MG IJ SOLR
500.0000 mg | Freq: Once | INTRAMUSCULAR | Status: AC
  Administered 2023-12-14: 500 mg via INTRAMUSCULAR
  Filled 2023-12-13: qty 500

## 2023-12-13 NOTE — ED Notes (Signed)
 Medicated per emar for pain. Sitting up in bed, on phone, A/Ox4, RR equal and unlabored. No complaints at this time.

## 2023-12-13 NOTE — ED Provider Notes (Signed)
 [place] EMERGENCY DEPARTMENT AT [facility] Provider Note   CSN: [id number] Arrival date & time: 12/13/23  1629     Patient presents with: Abdominal Cramping   Heidi Martin is a 23 y.o. female self reportedly otherwise healthy presents emerged part today for evaluation of lower abdominal cramping/pelvic pain.  She reports that she had her IUD placed at [facility] on 12/04/23.  She reports that she has been having some light pink spotting but no dysuria.  She reports that she was tested for STDs prior to IUD placement and was negative.  She has not been sexually active since.  She reports that she was having some nausea but only 1 episode of vomiting of the past 10 days.  No diarrhea, constipation, or fever.  Denies any dysuria or hematuria.  She reports that she had called the center that placed her IUD and encouraged her to continue taking the Tylenol  and ibuprofen .  She has not seen them in person since.  She reports that her IUD was placed without ultrasound.  She is post to have a Pap appointment for string check.  She reports has noted some vaginal discharge however it has been unchanged from her normal.  She has been taking ibuprofen  but no Tylenol  today.  Ibuprofen  last dose was at 1230.  Last dose of Tylenol  was yesterday.  She denies any medical or surgical history.  Denies any daily medications.  No known drug allergies.  Denies any tobacco, EtOH, drug use.   Abdominal Cramping Associated symptoms include abdominal pain.       Prior to Admission medications   Medication Sig Start Date End Date Taking? Authorizing Provider  amoxicillin  (AMOXIL ) 500 MG capsule Take 1 capsule (500 mg total) by mouth 3 (three) times daily. 11/14/23   Sofia, Leslie K, PA-C  aspirin EC 325 MG tablet Take 325 mg by mouth daily as needed for mild pain (or headaches).    [provider]  celecoxib  (CELEBREX ) 200 MG capsule Take 1 capsule (200 mg total) by mouth 2 (two) times daily.  03/19/23   Harris, Abigail, PA-C  ESTARYLLA 0.25-35 MG-MCG tablet Take 1 tablet by mouth daily. 03/15/23   [provider]  hyoscyamine  (LEVSIN AMIEL) 0.125 MG SL tablet Place 1 tablet (0.125 mg total) under the tongue every 6 (six) hours as needed for cramping (pain). Up to 1.25 mg daily 03/19/23   Harris, Abigail, PA-C  ibuprofen  (ADVIL ) 800 MG tablet Take 1 tablet (800 mg total) by mouth every 8 (eight) hours as needed. 11/14/23   Sofia, Leslie K, PA-C  pantoprazole  (PROTONIX ) 20 MG tablet Take 1 tablet (20 mg total) by mouth daily. 06/05/23 07/05/23  Robinson, John K, PA-C  prochlorperazine  (COMPAZINE ) 10 MG tablet Take 1 tablet (10 mg total) by mouth 2 (two) times daily as needed for nausea or vomiting. 03/19/23   Arloa Chroman, PA-C  TYLENOL  325 MG CAPS Take 325-650 mg by mouth every 6 (six) hours as needed (for pain or headaches).    [provider]  dicyclomine  (BENTYL ) 20 MG tablet Take 1 tablet (20 mg total) by mouth 2 (two) times daily. Patient not taking: No sig reported 11/14/19 02/14/20  Desiderio Chew, PA-C    Allergies: Milk [milk (cow)]    Review of Systems  Constitutional:  Negative for chills and fever.  Gastrointestinal:  Positive for abdominal pain and nausea. Negative for constipation, diarrhea and vomiting.  Genitourinary:  Positive for pelvic pain. Negative for difficulty urinating, dysuria, flank  pain, frequency, hematuria and urgency.    Updated Vital Signs BP 125/78   Pulse 70   Temp 97.7 F (36.5 C) (Oral)   Resp 16   SpO2 100%   Physical Exam Vitals and nursing note reviewed. Exam conducted with a chaperone present (Deshaunte, Tech).  Constitutional:      General: She is not in acute distress.    Appearance: She is not ill-appearing or toxic-appearing.  HENT:     Mouth/Throat:     Mouth: Mucous membranes are moist.   Eyes:     General: No scleral icterus.   Cardiovascular:     Rate and Rhythm: Normal rate.     Pulses:          Radial  pulses are 2+ on the right side and 2+ on the left side.       Dorsalis pedis pulses are 2+ on the right side and 2+ on the left side.       Posterior tibial pulses are 2+ on the right side and 2+ on the left side.  Pulmonary:     Effort: Pulmonary effort is normal. No respiratory distress.     Breath sounds: Normal breath sounds.  Abdominal:     Palpations: Abdomen is soft.     Tenderness: There is abdominal tenderness. There is no guarding or rebound.     Comments: Has had very lower right sided and suprapubic tenderness palpation.  No guarding or rebound.  Soft.  No overlying skin changes noted.  Genitourinary:    Labia:        Right: No tenderness or lesion.        Left: No tenderness or lesion.      Cervix: Friability and cervical bleeding present. No cervical motion tenderness.     Comments: 2 black strings are present coming out of the cervical os.  I did not see any body of the IUD.  Her cervix is friable.  Does appear slightly enlarged.  She does have some thicker white mucus discharge present from the cervical os mixed in with some darker blood.  No significant cervical motion tenderness.  Musculoskeletal:     Right lower leg: No edema.     Left lower leg: No edema.   Skin:    General: Skin is warm and dry.   Neurological:     Mental Status: She is alert.     (all labs ordered are listed, but only abnormal results are displayed) Labs Reviewed  CBC WITH DIFFERENTIAL/PLATELET - Abnormal; Notable for the following components:      Result Value   Hemoglobin 11.0 (*)    HCT 34.5 (*)    MCV 76.0 (*)    MCH 24.2 (*)    RDW 15.9 (*)    All other components within normal limits  WET PREP, GENITAL  URINALYSIS, ROUTINE W REFLEX MICROSCOPIC  COMPREHENSIVE METABOLIC PANEL WITH GFR  PREGNANCY, URINE  GC/CHLAMYDIA PROBE AMP (Rebecca) NOT AT [facility]    EKG: None  Radiology: CT ABDOMEN PELVIS W CONTRAST Result Date: 12/13/2023 CLINICAL DATA:  Left lower quadrant abdominal  pain EXAM: CT ABDOMEN AND PELVIS WITH CONTRAST TECHNIQUE: Multidetector CT imaging of the abdomen and pelvis was performed using the standard protocol following bolus administration of intravenous contrast. RADIATION DOSE REDUCTION: This exam was performed according to the departmental dose-optimization program which includes automated exposure control, adjustment of the mA and/or kV according to patient size and/or use of iterative reconstruction technique. CONTRAST:  OMNIPAQUE  IOHEXOL  300 MG/ML  SOLN COMPARISON:  CT abdomen pelvis 09/27/2023 FINDINGS: Lower chest: No acute abnormality. 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 The overall contour is stable from prior examinations. Normal gallbladder. No biliary dilation. Pancreas: Unremarkable. Spleen: Unremarkable. Adrenals/Urinary Tract: Normal adrenal glands. No urinary calculi or hydronephrosis. Bladder is unremarkable. Stomach/Bowel: Normal caliber large and small bowel. No bowel wall thickening. The appendix is normal.Stomach is within normal limits. Vascular/Lymphatic: No significant vascular findings are present. No enlarged abdominal or pelvic lymph nodes. Reproductive: IUD in the uterus.  No adnexal mass. Other: Small volume free fluid in the pelvis. No free intraperitoneal air. Musculoskeletal: No acute fracture. IMPRESSION: 1. No acute abnormality in the abdomen or pelvis. 2. 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 Electronically Signed   By: Norman Gatlin M.D.   On: 12/13/2023 22:34   US  Pelvis Complete Result Date: 12/13/2023 CLINICAL DATA:  Position area pain. EXAM: TRANSVAGINAL ULTRASOUND OF PELVIS DOPPLER ULTRASOUND OF OVARIES TECHNIQUE: Transvaginal ultrasound examination of the pelvis was performed including evaluation of the uterus, ovaries, adnexal regions, and pelvic cul-de-sac. Color and duplex Doppler ultrasound was utilized to evaluate blood flow to the  ovaries. COMPARISON:  None Available. FINDINGS: Uterus Measurements: 6.9 cm x 4.2 cm x 5.7 cm = volume: 8.47 mL. No fibroids or other mass visualized. Endometrium Thickness: 6.1 mm. No focal abnormality visualized. An IUD is seen within the endometrial canal. Right ovary Measurements: 4.3 cm x 2.2 cm x 2.7 cm = volume: 13.0 mL. Multiple small right ovarian follicles are seen. Left ovary Measurements: 3.6 cm x 1.9 cm x 2.2 cm = volume: 7.7 mL. Multiple small left ovarian follicles are seen. Pulsed Doppler evaluation demonstrates normal low-resistance arterial and venous waveforms in both ovaries. Other findings: There is a small amount of pelvic free fluid, likely physiologic. IMPRESSION: Unremarkable pelvic ultrasound with normal bilateral ovarian flow. Electronically Signed   By: Suzen Dials M.D.   On: 12/13/2023 20:17   US  Transvaginal Non-OB Result Date: 12/13/2023 CLINICAL DATA:  Position area pain. EXAM: TRANSVAGINAL ULTRASOUND OF PELVIS DOPPLER ULTRASOUND OF OVARIES TECHNIQUE: Transvaginal ultrasound examination of the pelvis was performed including evaluation of the uterus, ovaries, adnexal regions, and pelvic cul-de-sac. Color and duplex Doppler ultrasound was utilized to evaluate blood flow to the ovaries. COMPARISON:  None Available. FINDINGS: Uterus Measurements: 6.9 cm x 4.2 cm x 5.7 cm = volume: 8.47 mL. No fibroids or other mass visualized. Endometrium Thickness: 6.1 mm. No focal abnormality visualized. An IUD is seen within the endometrial canal. Right ovary Measurements: 4.3 cm x 2.2 cm x 2.7 cm = volume: 13.0 mL. Multiple small right ovarian follicles are seen. Left ovary Measurements: 3.6 cm x 1.9 cm x 2.2 cm = volume: 7.7 mL. Multiple small left ovarian follicles are seen. Pulsed Doppler evaluation demonstrates normal low-resistance arterial and venous waveforms in both ovaries. Other findings: There is a small amount of pelvic free fluid, likely physiologic. IMPRESSION: Unremarkable  pelvic ultrasound with normal bilateral ovarian flow. Electronically Signed   By: Suzen Dials M.D.   On: 12/13/2023 20:17   US  Art/Ven Flow Abd Pelv Doppler Result Date: 12/13/2023 CLINICAL DATA:  Position area pain. EXAM: TRANSVAGINAL ULTRASOUND OF PELVIS DOPPLER ULTRASOUND OF OVARIES TECHNIQUE: Transvaginal ultrasound examination of the pelvis was performed including evaluation of the uterus, ovaries, adnexal regions, and pelvic cul-de-sac. Color and duplex Doppler ultrasound was utilized to evaluate blood flow to the ovaries. COMPARISON:  None Available. FINDINGS: Uterus Measurements: 6.9 cm x 4.2 cm x 5.7 cm = volume: 8.47 mL. No fibroids or other mass visualized. Endometrium Thickness: 6.1 mm. No focal abnormality visualized. An IUD is seen within the endometrial canal. Right ovary Measurements: 4.3 cm x 2.2 cm x 2.7 cm = volume: 13.0 mL. Multiple small right ovarian follicles are seen. Left ovary Measurements: 3.6 cm x 1.9 cm x 2.2 cm = volume: 7.7 mL. Multiple small left ovarian follicles are seen. Pulsed Doppler evaluation demonstrates normal low-resistance arterial and venous waveforms in both ovaries. Other findings: There is a small amount of pelvic free fluid, likely physiologic. IMPRESSION: Unremarkable pelvic ultrasound with normal bilateral ovarian flow. Electronically Signed   By: Suzen Dials M.D.   On: 12/13/2023 20:17     Procedures   Medications Ordered in the ED - No data to display  Clinical Course as of 12/13/23 1941  Thu Dec 13, 2023  1937 Consulted OBGYN and spoke with Dr. Izell. He recommends ordered US  for position and for TOA. Would like re-consultation after US  and labs resulted.  [RR]    Clinical Course User Index [RR] Bernis Ernst, PA-C   Medical Decision Making Amount and/or Complexity of Data Reviewed Labs: ordered. Radiology: ordered.  Risk Prescription drug management.   23 y.o. female presents to the ER for evaluation of pelvic pain status  post IUD placement. Differential diagnosis includes but is not limited to IUD misplacement, uterine cramping, PID, torsion, ovarian cyst, perforation, appendicitis, ectopic pregnancy. Vital signs unremarkable. Physical exam as noted above.   I have ordered basic labs.  Patient reports that she did have STD testing for IUD placed at the.  Unfortunately, I am unable to view any of the medical records.  Continue with the labs.  Will consult gynecology determine if CT or ultrasound should be best modality.  I have consulted OB/GYN spoke with Dr. Izell.  Recommends ordering the ultrasound for positioning of the IUD as well as to rule out any tubo-ovarian abscess or torsion.  Would like reconsultation after ultrasound and labs have resulted.  I independently reviewed and interpreted the patient's labs.  CBC shows no leukocytosis.  Hemoglobin shows some anemia at 11 that appears from patient's baseline.  CMP without electrolyte or LFT abnormality.  Urinalysis shows large hemoglobin with large leuks.  6010 red blood cells with greater than 50 white blood cells seen but no bacteria.  Will culture.  Pregnancy test is negative.  Wet prep shows clue cells are present with greater than 10 white blood cells but no yeast, trichomoniasis, or sperm seen.  GC pending.  US  Unremarkable pelvic ultrasound with normal bilateral ovarian flow. Per radiologist's interpretation.    I have re-consulted Dr. Izell on the case. He reviewed the images and agrees that the IUD appears to be in the correct position. I discussed with him physical exam findings.  He reports that her cervix could be friable from bacterial vaginosis or yeast.  He recommends treating for PID and have the patient follow up with [facility] tomorrow. Do not need to remove IUD. Could order CT to r/o perforation, however the patient does not appear in any acute distress. Will order to rule out any other abdominal process such as appendicitis.  CT Abd  Pelvis : 1. No acute abnormality in the abdomen or pelvis. 2. 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  I have ordered the patient a Rocephin  IM shot here.  Will send  her home with 2 weeks of doxycycline  and Flagyl .  We discussed not drinking while on Flagyl  as it can make you ill.  She is hemodynamically stable without any fever.  Has not had a fever at home.  She reports that she did have STD testing beforehand and has not been sexually active however I have ordered the [facility] and is pending.  Will need follow-up for this.  Given her urinalysis, could be having some UTI we will culture and treat.    Discussed the lab and imaging findings with the patient at bedside as well as my conversation with Dr. Izell.  She reports that she will follow-up with [facility] tomorrow.  I have discussed the importance of medications and being compliant with them.  I have given her her first dose here in the emergency department.  We have discussed abstaining from sexual intercourse.  She is stable for discharge home with close outpatient follow-up.  We discussed the results of the labs/imaging. The plan is take antibiotics, follow-up with [facility], strict return precautions. We discussed strict return precautions and red flag symptoms. The patient verbalized their understanding and agrees to the plan. The patient is stable and being discharged home in good condition.  Portions of this report may have been transcribed using voice recognition software. Every effort was made to ensure accuracy; however, inadvertent computerized transcription errors may be present.    Final diagnoses:  BV (bacterial vaginosis)  Pelvic pain  Acute cystitis without hematuria    ED Discharge Orders          Ordered    cefdinir  (OMNICEF ) 300 MG capsule  2 times daily        12/14/23 0007    doxycycline  (VIBRAMYCIN ) 100 MG capsule  2 times daily        12/13/23 2356    metroNIDAZOLE  (FLAGYL ) 500 MG  tablet  2 times daily        12/13/23 2356               Bernis Ernst, PA-C 12/17/23 1202    Lenor Hollering, MD 12/24/23 [id number]

## 2023-12-13 NOTE — ED Triage Notes (Signed)
 Lower abo pain, cramping started about 10 days ago, got more intense ove the last 3 days New IUD implanted 10 days ago

## 2023-12-13 NOTE — Discharge Instructions (Addendum)
 You were seen in the ER today for evaluation of your symptoms. After speaking with our gynecologist team, we are going to treat this, but they ask that you see the person who inserted your IUD at [facility] tomorrow. Please make sure that you call to follow-up with them.  I am going to treat this with 2 medications.  One is called doxycycline  every other is called Flagyl . I am also going to treat you for a UTI (urinary tract infection).  Please do not drink any alcohol while on this medication does not make you significantly ill.  It is important stay compliant with your medications and take every dose without missing.  You can take a probiotic with this medication prevent yeast infections or upset stomach.  If you were to have any worsening pain, fever, nausea, vomiting, difficulty urinating or pain with urination, please return to the nearest emergency department for evaluation.  If you have any other concerns, new or worsening symptoms, please return to the nearest emergency department for evaluation.  Contact a health care provider if: You have any of the following problems with your IUD string or strings: The string bothers or hurts you or your sexual partner. You can't feel the string. The string has gotten longer. The IUD comes out or you can feel the IUD in your vagina. You think you may be pregnant, or you miss your period. You think you may have an STI. You have bad-smelling discharge from your vagina. You have a fever and chills. You have pain during sex. Get help right away if: You have heavy bleeding, which means soaking more than 2 pads per hour for 2 hours in a row. You have sudden, really bad belly pain.

## 2023-12-14 LAB — GC/CHLAMYDIA PROBE AMP (~~LOC~~) NOT AT ARMC
Chlamydia: POSITIVE — AB
Comment: NEGATIVE
Comment: NORMAL
Neisseria Gonorrhea: NEGATIVE

## 2023-12-14 MED ORDER — CEFDINIR 300 MG PO CAPS: 300.0000 mg | ORAL_CAPSULE | Freq: Two times a day (BID) | ORAL | 0 refills | Status: AC

## 2023-12-14 NOTE — ED Notes (Signed)
 GC swab collected and walked to lab.

## 2023-12-15 LAB — URINE CULTURE

## 2023-12-19 ENCOUNTER — Ambulatory Visit (HOSPITAL_COMMUNITY): Payer: Self-pay

## 2024-01-14 ENCOUNTER — Emergency Department (HOSPITAL_BASED_OUTPATIENT_CLINIC_OR_DEPARTMENT_OTHER)

## 2024-01-14 ENCOUNTER — Emergency Department (HOSPITAL_BASED_OUTPATIENT_CLINIC_OR_DEPARTMENT_OTHER): Admission: EM | Admit: 2024-01-14 | Discharge: 2024-01-14 | Disposition: A | Discharge status: Home / Self Care

## 2024-01-14 ENCOUNTER — Encounter (HOSPITAL_BASED_OUTPATIENT_CLINIC_OR_DEPARTMENT_OTHER): Payer: Self-pay

## 2024-01-14 ENCOUNTER — Other Ambulatory Visit: Payer: Self-pay

## 2024-01-14 DIAGNOSIS — D649 Anemia, unspecified: Secondary | ICD-10-CM | POA: Diagnosis not present

## 2024-01-14 DIAGNOSIS — Z8616 Personal history of COVID-19: Secondary | ICD-10-CM | POA: Diagnosis not present

## 2024-01-14 DIAGNOSIS — N760 Acute vaginitis: Secondary | ICD-10-CM | POA: Insufficient documentation

## 2024-01-14 DIAGNOSIS — D560 Alpha thalassemia: Secondary | ICD-10-CM | POA: Diagnosis not present

## 2024-01-14 DIAGNOSIS — Z202 Contact with and (suspected) exposure to infections with a predominantly sexual mode of transmission: Secondary | ICD-10-CM | POA: Diagnosis not present

## 2024-01-14 DIAGNOSIS — N644 Mastodynia: Secondary | ICD-10-CM | POA: Diagnosis not present

## 2024-01-14 DIAGNOSIS — B9689 Other specified bacterial agents as the cause of diseases classified elsewhere: Secondary | ICD-10-CM | POA: Insufficient documentation

## 2024-01-14 DIAGNOSIS — Z7982 Long term (current) use of aspirin: Secondary | ICD-10-CM | POA: Diagnosis not present

## 2024-01-14 DIAGNOSIS — R109 Unspecified abdominal pain: Secondary | ICD-10-CM | POA: Diagnosis present

## 2024-01-14 DIAGNOSIS — R1084 Generalized abdominal pain: Secondary | ICD-10-CM

## 2024-01-14 LAB — COMPREHENSIVE METABOLIC PANEL WITH GFR
ALT: 14 U/L (ref 0–44)
AST: 30 U/L (ref 15–41)
Albumin: 4.2 g/dL (ref 3.5–5.0)
Alkaline Phosphatase: 92 U/L (ref 38–126)
Anion gap: 12 (ref 5–15)
BUN: 7 mg/dL (ref 6–20)
CO2: 22 mmol/L (ref 22–32)
Calcium: 9.3 mg/dL (ref 8.9–10.3)
Chloride: 104 mmol/L (ref 98–111)
Creatinine, Ser: 0.97 mg/dL (ref 0.44–1.00)
GFR, Estimated: >60 mL/min (ref >60)
Glucose, Bld: 118 mg/dL — ABNORMAL HIGH (ref 70–99)
Potassium: 3.7 mmol/L (ref 3.5–5.1)
Sodium: 138 mmol/L (ref 135–145)
Total Bilirubin: 0.4 mg/dL (ref 0.0–1.2)
Total Protein: 6.8 g/dL (ref 6.5–8.1)

## 2024-01-14 LAB — CBC
HCT: 33.5 % — ABNORMAL LOW (ref 36.0–46.0)
Hemoglobin: 10.8 g/dL — ABNORMAL LOW (ref 12.0–15.0)
MCH: 24.7 pg — ABNORMAL LOW (ref 26.0–34.0)
MCHC: 32.2 g/dL (ref 30.0–36.0)
MCV: 76.7 fL — ABNORMAL LOW (ref 80.0–100.0)
Platelets: 302 K/uL (ref 150–400)
RBC: 4.37 MIL/uL (ref 3.87–5.11)
RDW: 17 % — ABNORMAL HIGH (ref 11.5–15.5)
WBC: 9.1 K/uL (ref 4.0–10.5)
nRBC: 0 % (ref 0.0–0.2)

## 2024-01-14 LAB — URINALYSIS, ROUTINE W REFLEX MICROSCOPIC
Bilirubin Urine: NEGATIVE
Glucose, UA: NEGATIVE mg/dL
Ketones, ur: NEGATIVE mg/dL
Nitrite: NEGATIVE
Protein, ur: NEGATIVE mg/dL
Specific Gravity, Urine: 1.027 (ref 1.005–1.030)
pH: 5.5 (ref 5.0–8.0)

## 2024-01-14 LAB — WET PREP, GENITAL
Sperm: NONE SEEN
Trich, Wet Prep: NONE SEEN
WBC, Wet Prep HPF POC: <10 (ref <10)
Yeast Wet Prep HPF POC: NONE SEEN

## 2024-01-14 LAB — LIPASE, BLOOD: Lipase: 21 U/L (ref 11–51)

## 2024-01-14 LAB — PREGNANCY, URINE: Preg Test, Ur: NEGATIVE

## 2024-01-14 MED ORDER — METRONIDAZOLE 500 MG PO TABS
500.0000 mg | ORAL_TABLET | Freq: Once | ORAL | Status: AC
  Administered 2024-01-14: 500 mg via ORAL
  Filled 2024-01-14: qty 1

## 2024-01-14 MED ORDER — MORPHINE SULFATE (PF) 4 MG/ML IV SOLN
4.0000 mg | Freq: Once | INTRAVENOUS | Status: AC
  Administered 2024-01-14: 4 mg via INTRAVENOUS
  Filled 2024-01-14: qty 1

## 2024-01-14 MED ORDER — CEFTRIAXONE SODIUM 500 MG IJ SOLR
500.0000 mg | Freq: Once | INTRAMUSCULAR | Status: AC
  Administered 2024-01-14: 500 mg via INTRAMUSCULAR
  Filled 2024-01-14: qty 500

## 2024-01-14 MED ORDER — DOXYCYCLINE HYCLATE 100 MG PO TABS
100.0000 mg | ORAL_TABLET | Freq: Once | ORAL | Status: AC
  Administered 2024-01-14: 100 mg via ORAL
  Filled 2024-01-14: qty 1

## 2024-01-14 MED ORDER — FENTANYL CITRATE PF 50 MCG/ML IJ SOSY
50.0000 ug | PREFILLED_SYRINGE | Freq: Once | INTRAMUSCULAR | Status: AC
  Administered 2024-01-14: 50 ug via INTRAVENOUS
  Filled 2024-01-14: qty 1

## 2024-01-14 MED ORDER — METRONIDAZOLE 500 MG PO TABS: 500.0000 mg | ORAL_TABLET | Freq: Two times a day (BID) | ORAL | 0 refills | Status: AC

## 2024-01-14 MED ORDER — DICYCLOMINE HCL 20 MG PO TABS: 20.0000 mg | ORAL_TABLET | Freq: Two times a day (BID) | ORAL | 0 refills | Status: AC

## 2024-01-14 MED ORDER — ONDANSETRON 4 MG PO TBDP: 4.0000 mg | ORAL_TABLET | Freq: Three times a day (TID) | ORAL | 0 refills | Status: AC | PRN

## 2024-01-14 MED ORDER — IOHEXOL 300 MG/ML  SOLN
100.0000 mL | Freq: Once | INTRAMUSCULAR | Status: AC | PRN
  Administered 2024-01-14: 100 mL via INTRAVENOUS

## 2024-01-14 MED ORDER — DOXYCYCLINE HYCLATE 100 MG PO CAPS: 100.0000 mg | ORAL_CAPSULE | Freq: Two times a day (BID) | ORAL | 0 refills | Status: AC

## 2024-01-14 MED ORDER — ONDANSETRON HCL 4 MG/2ML IJ SOLN
4.0000 mg | Freq: Once | INTRAMUSCULAR | Status: AC
  Administered 2024-01-14: 4 mg via INTRAVENOUS
  Filled 2024-01-14: qty 2

## 2024-01-14 MED ORDER — SODIUM CHLORIDE 0.9 % IV BOLUS
1000.0000 mL | Freq: Once | INTRAVENOUS | Status: AC
  Administered 2024-01-14: 1000 mL via INTRAVENOUS

## 2024-01-14 NOTE — ED Triage Notes (Signed)
 Pt presents via POV c/o mid abd pain x1 week. Reports pinkish vaginal discharge. Denies dysuria or rectal bleeding. Reports some nausea.   Ambulatory to triage. A&O x4.

## 2024-01-14 NOTE — ED Notes (Signed)
 Pt also c/o bilateral breast pain for 'a while'. Pt reports more so in R breast. Denies any notable physical changes, lumps, or nipple discharge. Reports mother has breast cancer and had double mastectomy so is concerned for same.

## 2024-01-14 NOTE — Discharge Instructions (Addendum)
 You did have bacterial vaginosis infection.  We also empirically covered you for gonorrhea and chlamydia.  The official results will be back in a couple days.  CT scan did not show any concerning findings.  Follow-up with your primary care doctor.  Return for any emergent symptoms.  I have also sent a urine culture to confirm if you have a urinary tract infection.  The initial urine sample did not show any significant signs of an infection.

## 2024-01-14 NOTE — ED Provider Notes (Signed)
 [place] EMERGENCY DEPARTMENT AT [facility] Provider Note   CSN: [id number] Arrival date & time: 01/14/24  1929     Patient presents with: Abdominal Pain   Heidi Martin is a 23 y.o. female.   23 year old female presents today for concern of abdominal pain.  Endorses some nausea but denies vomiting.  Also endorses right breast pain.  She states this has been going on for quite some time.  Endorses constant pain.  No recent changes.  Does not feel any lumps or other physical changes she can identify.  No discharge.  Also endorses recent unprotected sexual intercourse.  Endorses light pink discharge.  This is similar to what she had previously.  Denies fever, severe pain.  The history is provided by the patient. No language interpreter was used.       Prior to Admission medications   Medication Sig Start Date End Date Taking? Authorizing Provider  dicyclomine  (BENTYL ) 20 MG tablet Take 1 tablet (20 mg total) by mouth 2 (two) times daily. 01/14/24  Yes Leela Vanbrocklin, PA-C  doxycycline  (VIBRAMYCIN ) 100 MG capsule Take 1 capsule (100 mg total) by mouth 2 (two) times daily. 01/14/24  Yes Carlito Bogert, PA-C  metroNIDAZOLE  (FLAGYL ) 500 MG tablet Take 1 tablet (500 mg total) by mouth 2 (two) times daily. 01/14/24  Yes Elsey Holts, PA-C  ondansetron  (ZOFRAN -ODT) 4 MG disintegrating tablet Take 1 tablet (4 mg total) by mouth every 8 (eight) hours as needed. 01/14/24  Yes Hildegard Loge, PA-C  aspirin EC 325 MG tablet Take 325 mg by mouth daily as needed for mild pain (or headaches).    [provider]  cefdinir  (OMNICEF ) 300 MG capsule Take 1 capsule (300 mg total) by mouth 2 (two) times daily. 12/14/23   Bernis Ernst, PA-C  celecoxib  (CELEBREX ) 200 MG capsule Take 1 capsule (200 mg total) by mouth 2 (two) times daily. 03/19/23   Harris, Abigail, PA-C  ESTARYLLA 0.25-35 MG-MCG tablet Take 1 tablet by mouth daily. 03/15/23   [provider]  hyoscyamine  (LEVSIN AMIEL) 0.125 MG SL  tablet Place 1 tablet (0.125 mg total) under the tongue every 6 (six) hours as needed for cramping (pain). Up to 1.25 mg daily 03/19/23   Harris, Abigail, PA-C  ibuprofen  (ADVIL ) 800 MG tablet Take 1 tablet (800 mg total) by mouth every 8 (eight) hours as needed. 11/14/23   Sofia, Leslie K, PA-C  pantoprazole  (PROTONIX ) 20 MG tablet Take 1 tablet (20 mg total) by mouth daily. 06/05/23 07/05/23  Robinson, John K, PA-C  prochlorperazine  (COMPAZINE ) 10 MG tablet Take 1 tablet (10 mg total) by mouth 2 (two) times daily as needed for nausea or vomiting. 03/19/23   Arloa Chroman, PA-C  TYLENOL  325 MG CAPS Take 325-650 mg by mouth every 6 (six) hours as needed (for pain or headaches).    [provider]    Allergies: Milk [milk (cow)]    Review of Systems  Constitutional:  Negative for chills and fever.  Respiratory:  Negative for shortness of breath.   Cardiovascular:  Negative for chest pain.  Gastrointestinal:  Positive for abdominal pain and nausea. Negative for vomiting.  Neurological:  Negative for light-headedness.  All other systems reviewed and are negative.   Updated Vital Signs BP 115/78   Pulse 65   Temp 98.1 F (36.7 C) (Oral)   Resp 20   Ht 5' 7 (1.702 m)   Wt 87.1 kg   SpO2 100%   Breastfeeding No   BMI 30.07  kg/m   Physical Exam Vitals and nursing note reviewed.  Constitutional:      General: She is not in acute distress.    Appearance: Normal appearance. She is not ill-appearing.  HENT:     Head: Normocephalic and atraumatic.     Nose: Nose normal.  Eyes:     Conjunctiva/sclera: Conjunctivae normal.  Cardiovascular:     Rate and Rhythm: Normal rate and regular rhythm.  Pulmonary:     Effort: Pulmonary effort is normal. No respiratory distress.  Abdominal:     General: There is no distension.     Palpations: Abdomen is soft.     Tenderness: There is abdominal tenderness (Mild tenderness around the umbilicus, in the left side of abdomen). There is no  right CVA tenderness, left CVA tenderness, guarding or rebound.  Musculoskeletal:        General: No deformity. Normal range of motion.     Cervical back: Normal range of motion.  Skin:    Findings: No rash.  Neurological:     Mental Status: She is alert.     (all labs ordered are listed, but only abnormal results are displayed) Labs Reviewed  WET PREP, GENITAL - Abnormal; Notable for the following components:      Result Value   Clue Cells Wet Prep HPF POC PRESENT (*)    All other components within normal limits  COMPREHENSIVE METABOLIC PANEL WITH GFR - Abnormal; Notable for the following components:   Glucose, Bld 118 (*)    All other components within normal limits  CBC - Abnormal; Notable for the following components:   Hemoglobin 10.8 (*)    HCT 33.5 (*)    MCV 76.7 (*)    MCH 24.7 (*)    RDW 17.0 (*)    All other components within normal limits  URINALYSIS, ROUTINE W REFLEX MICROSCOPIC - Abnormal; Notable for the following components:   APPearance HAZY (*)    Hgb urine dipstick SMALL (*)    Leukocytes,Ua MODERATE (*)    Bacteria, UA RARE (*)    All other components within normal limits  URINE CULTURE  LIPASE, BLOOD  PREGNANCY, URINE  GC/CHLAMYDIA PROBE AMP ([place]) NOT AT [facility]    EKG: None  Radiology: CT ABDOMEN PELVIS W CONTRAST Result Date: 01/14/2024 CLINICAL DATA:  Mid abdominal pain for 1 week. Pinkish vaginal discharge. Nausea. EXAM: CT ABDOMEN AND PELVIS WITH CONTRAST TECHNIQUE: Multidetector CT imaging of the abdomen and pelvis was performed using the standard protocol following bolus administration of intravenous contrast. RADIATION DOSE REDUCTION: This exam was performed according to the departmental dose-optimization program which includes automated exposure control, adjustment of the mA and/or kV according to patient size and/or use of iterative reconstruction technique. CONTRAST:  OMNIPAQUE  IOHEXOL  300 MG/ML  SOLN COMPARISON:  CT 12/13/2023  FINDINGS: Lower chest: No acute abnormality. 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 Normal gallbladder. No biliary dilation. Pancreas: Unremarkable. Spleen: Unremarkable. Adrenals/Urinary Tract: Normal adrenal glands. No urinary calculi or hydronephrosis. Bladder is unremarkable. Stomach/Bowel: Normal caliber large and small bowel. No bowel wall thickening. The appendix is normal.Stomach is within normal limits. Vascular/Lymphatic: No significant vascular findings are present. No enlarged abdominal or pelvic lymph nodes. Reproductive: 4.6 cm simple left adnexal cyst. IUD in the uterus. Unremarkable right ovary. 2.2 x 1.8 cm Bartholin gland cyst on the left. Other: No free intraperitoneal fluid or air. Musculoskeletal: No acute fracture. IMPRESSION: 1. No  acute abnormality in the abdomen or pelvis. 2. 4.6 cm simple left adnexal cyst. No follow-up imaging recommended. Note: This recommendation does not apply to premenarchal patients and to those with increased risk (genetic, family history, elevated tumor markers or other high-risk factors) of ovarian cancer. Reference: JACR 2020 Feb; 17(2):248-254 3. Unchanged 2.2 cm Bartholin gland cyst on the left. 4. 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 Electronically Signed   By: Norman Gatlin M.D.   On: 01/14/2024 22:24     Procedures   Medications Ordered in the ED  metroNIDAZOLE  (FLAGYL ) tablet 500 mg (has no administration in time range)  doxycycline  (VIBRA -TABS) tablet 100 mg (has no administration in time range)  cefTRIAXone  (ROCEPHIN ) injection 500 mg (has no administration in time range)  sodium chloride  0.9 % bolus 1,000 mL (0 mLs Intravenous Stopped 01/14/24 2336)  morphine  (PF) 4 MG/ML injection 4 mg (4 mg Intravenous Given 01/14/24 2149)  ondansetron  (ZOFRAN ) injection 4 mg (4 mg Intravenous Given 01/14/24 2149)  iohexol   (OMNIPAQUE ) 300 MG/ML solution 100 mL (100 mLs Intravenous Contrast Given 01/14/24 2203)  fentaNYL  (SUBLIMAZE ) injection 50 mcg (50 mcg Intravenous Given 01/14/24 2236)                                    Medical Decision Making Amount and/or Complexity of Data Reviewed Labs: ordered. Radiology: ordered.  Risk Prescription drug management.   Medical Decision Making / ED Course   This patient presents to the ED for concern of abdominal pain, this involves an extensive number of treatment options, and is a complaint that carries with it a high risk of complications and morbidity.  The differential diagnosis includes gastroenteritis, cholecystitis, appendicitis, UTI, pyelonephritis, PID  MDM: 23 year old female presents today for concern of above-mentioned complaints.  Overall she is well-appearing.  Hemodynamically stable. CBC without leukocytosis.  Anemia patient's baseline.  CMP without acute concern.  UA with some leukocytes otherwise without acute concern.  Will send urine culture to confirm infection.  Wet prep positive for clue cells.  STI panel sent.  Patient prefers empiric treatment.  Will empirically treat.  Patient defers GYN exam and prefers to self swab.  BV positive Improved on reevaluation.  CT scan without acute concern. States her mom has history of breast cancer and is s/p mastectomy.  Will give referral to high risk breast clinic for her breast pain.  Does not appear to be an acute concern given the recent change in this has been going on for quite some time.  Do feel given her family history she would benefit from breast clinic follow-up. Discharged in stable condition.  Return precaution discussed.  Patient voices understanding and is in agreement with plan.      Additional history obtained: -Additional history obtained from chart review -External records from outside source obtained and reviewed including: Chart review including previous notes, labs, imaging,  consultation notes   Lab Tests: -I ordered, reviewed, and interpreted labs.   The pertinent results include:   Labs Reviewed  WET PREP, GENITAL - Abnormal; Notable for the following components:      Result Value   Clue Cells Wet Prep HPF POC PRESENT (*)    All other components within normal limits  COMPREHENSIVE METABOLIC PANEL WITH GFR - Abnormal; Notable for the following components:   Glucose, Bld 118 (*)    All other  components within normal limits  CBC - Abnormal; Notable for the following components:   Hemoglobin 10.8 (*)    HCT 33.5 (*)    MCV 76.7 (*)    MCH 24.7 (*)    RDW 17.0 (*)    All other components within normal limits  URINALYSIS, ROUTINE W REFLEX MICROSCOPIC - Abnormal; Notable for the following components:   APPearance HAZY (*)    Hgb urine dipstick SMALL (*)    Leukocytes,Ua MODERATE (*)    Bacteria, UA RARE (*)    All other components within normal limits  URINE CULTURE  LIPASE, BLOOD  PREGNANCY, URINE  GC/CHLAMYDIA PROBE AMP () NOT AT [facility]      EKG  EKG Interpretation Date/Time:    Ventricular Rate:    PR Interval:    QRS Duration:    QT Interval:    QTC Calculation:   R Axis:      Text Interpretation:           Imaging Studies ordered: I ordered imaging studies including CT abdomen pelvis with contrast I independently visualized and interpreted imaging. I agree with the radiologist interpretation   Medicines ordered and prescription drug management: Meds ordered this encounter  Medications   sodium chloride  0.9 % bolus 1,000 mL   morphine  (PF) 4 MG/ML injection 4 mg   ondansetron  (ZOFRAN ) injection 4 mg   iohexol  (OMNIPAQUE ) 300 MG/ML solution 100 mL   fentaNYL  (SUBLIMAZE ) injection 50 mcg   metroNIDAZOLE  (FLAGYL ) tablet 500 mg   doxycycline  (VIBRA -TABS) tablet 100 mg   cefTRIAXone  (ROCEPHIN ) injection 500 mg    Antibiotic Indication::   STD   doxycycline  (VIBRAMYCIN ) 100 MG capsule    Sig: Take 1 capsule (100 mg  total) by mouth 2 (two) times daily.    Dispense:  20 capsule    Refill:  0    Supervising Provider:   MILLER, BRIAN [[id number]]   metroNIDAZOLE  (FLAGYL ) 500 MG tablet    Sig: Take 1 tablet (500 mg total) by mouth 2 (two) times daily.    Dispense:  14 tablet    Refill:  0    Supervising Provider:   CLEOTILDE ROGUE [[id number]]   dicyclomine  (BENTYL ) 20 MG tablet    Sig: Take 1 tablet (20 mg total) by mouth 2 (two) times daily.    Dispense:  20 tablet    Refill:  0    Supervising Provider:   CLEOTILDE, BRIAN [[id number]]   ondansetron  (ZOFRAN -ODT) 4 MG disintegrating tablet    Sig: Take 1 tablet (4 mg total) by mouth every 8 (eight) hours as needed.    Dispense:  20 tablet    Refill:  0    Supervising Provider:   CLEOTILDE ROGUE [[id number]]    -I have reviewed the patients home medicines and have made adjustments as needed   Reevaluation: After the interventions noted above, I reevaluated the patient and found that they have :improved  Co morbidities that complicate the patient evaluation  Past Medical History:  Diagnosis Date   Alpha thalassemia silent carrier    Chronic intractable headache 10/13/2014   COVID-19    Depression    Fatigue 05/31/2015   Irritable bowel syndrome (IBS)    Musculoskeletal pain 05/31/2015   PID (acute pelvic inflammatory disease)    Sickle cell trait (HCC)    Trichomonas infection    UTI (urinary tract infection)       Dispostion: Discharged in stable condition.  Return precaution discussed.  Patient voices  understanding and is in agreement with plan.    Final diagnoses:  Generalized abdominal pain  Possible exposure to STI  BV (bacterial vaginosis)    ED Discharge Orders          Ordered    doxycycline  (VIBRAMYCIN ) 100 MG capsule  2 times daily        01/14/24 2338    metroNIDAZOLE  (FLAGYL ) 500 MG tablet  2 times daily        01/14/24 2338    dicyclomine  (BENTYL ) 20 MG tablet  2 times daily        01/14/24 2338    ondansetron  (ZOFRAN -ODT) 4 MG  disintegrating tablet  Every 8 hours PRN        01/14/24 2338               Hildegard Loge, PA-C 01/14/24 2346    Neysa Caron PARAS, DO 01/14/24 2347

## 2024-01-16 ENCOUNTER — Ambulatory Visit (HOSPITAL_COMMUNITY): Payer: Self-pay

## 2024-01-16 LAB — GC/CHLAMYDIA PROBE AMP (~~LOC~~) NOT AT ARMC
Chlamydia: POSITIVE — AB
Comment: NEGATIVE
Comment: NORMAL
Neisseria Gonorrhea: NEGATIVE

## 2024-01-19 LAB — URINE CULTURE: Culture: 50000 — AB

## 2024-01-20 ENCOUNTER — Telehealth (HOSPITAL_BASED_OUTPATIENT_CLINIC_OR_DEPARTMENT_OTHER): Payer: Self-pay | Admitting: *Deleted

## 2024-01-20 NOTE — Telephone Encounter (Signed)
 Post ED Visit - Positive Culture Follow-up: Successful Patient Follow-Up  Culture assessed and recommendations reviewed by:  []  Rankin Dee, Pharm.D. []  Venetia Gully, Pharm.D., BCPS AQ-ID []  Garrel Crews, Pharm.D., BCPS []  Almarie Lunger, Pharm.D., BCPS []  [place], [address].D., BCPS, AAHIVP []  Rosaline Bihari, Pharm.D., BCPS, AAHIVP []  Vernell Meier, PharmD, BCPS []  Latanya Hint, PharmD, BCPS []  Donald Medley, PharmD, BCPS [x]  Dorn Buttner,  PharmD  Positive urine culture  Plan: Continue Doxy for STI treatment Continue Flagyl  for BV Start Cipro 500mg  Q12H x 3 days  Changes discussed with ED provider: Lonni Sakai, MD New antibiotic prescription Cipro 500mg  Q12H x 3 days Called to [place] at [address] of [address] [place]  Contacted patient, date 01/20/24, time 1338   Heidi Martin 01/20/2024, 1:39 PM

## 2024-01-20 NOTE — Progress Notes (Signed)
 ED Antimicrobial Stewardship Positive Culture Follow Up   Heidi Martin is an 23 y.o. female who presented to [facility] on @ADMITDT @ with a chief complaint of  Chief Complaint  Patient presents with   Abdominal Pain    Recent Results (from the past 720 hours)  Urine Culture     Status: Abnormal   Collection Time: 01/14/24  7:43 PM   Specimen: Urine, Clean Catch  Result Value Ref Range Status   Specimen Description   Final    URINE, CLEAN CATCH Performed at [facility], [address], [place], [place] [address]    Special Requests   Final    NONE Performed at [facility], [address], [place], [place] [address]    Culture (A)  Final    50,000 COLONIES/mL PROTEUS MIRABILIS 40,000 COLONIES/mL STAPHYLOCOCCUS HAEMOLYTICUS    Report Status 01/19/2024 FINAL  Final   Organism ID, Bacteria PROTEUS MIRABILIS (A)  Final   Organism ID, Bacteria STAPHYLOCOCCUS HAEMOLYTICUS (A)  Final      Susceptibility   Proteus mirabilis - MIC*    AMPICILLIN <=2 SENSITIVE Sensitive     CEFAZOLIN <=4 SENSITIVE Sensitive     CEFEPIME <=0.12 SENSITIVE Sensitive     CEFTRIAXONE  <=0.25 SENSITIVE Sensitive     CIPROFLOXACIN <=0.25 SENSITIVE Sensitive     GENTAMICIN <=1 SENSITIVE Sensitive     IMIPENEM 2 SENSITIVE Sensitive     NITROFURANTOIN  RESISTANT Resistant     TRIMETH/SULFA <=20 SENSITIVE Sensitive     AMPICILLIN/SULBACTAM <=2 SENSITIVE Sensitive     PIP/TAZO <=4 SENSITIVE Sensitive ug/mL    * 50,000 COLONIES/mL PROTEUS MIRABILIS   Staphylococcus haemolyticus - MIC*    CIPROFLOXACIN <=0.5 SENSITIVE Sensitive     GENTAMICIN <=0.5 SENSITIVE Sensitive     NITROFURANTOIN  <=16 SENSITIVE Sensitive     OXACILLIN >=4 RESISTANT Resistant     TETRACYCLINE >=16 RESISTANT Resistant     VANCOMYCIN 1 SENSITIVE Sensitive     TRIMETH/SULFA <=10 SENSITIVE Sensitive     RIFAMPIN <=0.5 SENSITIVE Sensitive     Inducible Clindamycin POSITIVE Resistant     * 40,000  COLONIES/mL STAPHYLOCOCCUS HAEMOLYTICUS  Wet prep, genital     Status: Abnormal   Collection Time: 01/14/24  9:51 PM   Specimen: PATH Cytology Cervicovaginal Ancillary Only  Result Value Ref Range Status   Yeast Wet Prep HPF POC NONE SEEN NONE SEEN Final   Trich, Wet Prep NONE SEEN NONE SEEN Final   Clue Cells Wet Prep HPF POC PRESENT (A) NONE SEEN Final   WBC, Wet Prep HPF POC <10 <10 Final    Comment: Swab received with less than 0.5 mL of saline, saline added to specimen, interpret results with caution.   Sperm NONE SEEN  Final    Comment: Performed at [facility], [address], [place], [place] [address]    [x]  Patient on doxycycline  and metronidazole  for STI + BV treatment. ED provider note mentions treating for UTI if cultures grows out something so will need to add an additional antibiotic.  Continue Doxycycline  + Flagyl  as prescribed ADD: Ciprofloxacin 500mg  BID x 3 days (Qty 6; Refills 0)  ED Provider: [place]   Dorn Buttner, PharmD, BCPS 01/20/2024 10:21 AM ED Clinical Pharmacist -  [phone]

## 2024-01-28 ENCOUNTER — Encounter: Payer: Self-pay | Admitting: Physician Assistant

## 2024-01-28 ENCOUNTER — Emergency Department (HOSPITAL_BASED_OUTPATIENT_CLINIC_OR_DEPARTMENT_OTHER)

## 2024-01-28 ENCOUNTER — Emergency Department (HOSPITAL_BASED_OUTPATIENT_CLINIC_OR_DEPARTMENT_OTHER): Admission: EM | Admit: 2024-01-28 | Discharge: 2024-01-28 | Disposition: A | Discharge status: Home / Self Care

## 2024-01-28 DIAGNOSIS — R55 Syncope and collapse: Secondary | ICD-10-CM | POA: Diagnosis present

## 2024-01-28 DIAGNOSIS — R519 Headache, unspecified: Secondary | ICD-10-CM | POA: Insufficient documentation

## 2024-01-28 DIAGNOSIS — N643 Galactorrhea not associated with childbirth: Secondary | ICD-10-CM

## 2024-01-28 LAB — TROPONIN T, HIGH SENSITIVITY: Troponin T High Sensitivity: <15 ng/L (ref <19)

## 2024-01-28 LAB — CBC WITH DIFFERENTIAL/PLATELET
Abs Immature Granulocytes: 0.01 K/uL (ref 0.00–0.07)
Basophils Absolute: 0 K/uL (ref 0.0–0.1)
Basophils Relative: 1 %
Eosinophils Absolute: 0.1 K/uL (ref 0.0–0.5)
Eosinophils Relative: 1 %
HCT: 35.7 % — ABNORMAL LOW (ref 36.0–46.0)
Hemoglobin: 11.3 g/dL — ABNORMAL LOW (ref 12.0–15.0)
Immature Granulocytes: 0 %
Lymphocytes Relative: 22 %
Lymphs Abs: 1.5 K/uL (ref 0.7–4.0)
MCH: 24.4 pg — ABNORMAL LOW (ref 26.0–34.0)
MCHC: 31.7 g/dL (ref 30.0–36.0)
MCV: 77.1 fL — ABNORMAL LOW (ref 80.0–100.0)
Monocytes Absolute: 0.5 K/uL (ref 0.1–1.0)
Monocytes Relative: 8 %
Neutro Abs: 4.6 K/uL (ref 1.7–7.7)
Neutrophils Relative %: 68 %
Platelets: 262 K/uL (ref 150–400)
RBC: 4.63 MIL/uL (ref 3.87–5.11)
RDW: 17.2 % — ABNORMAL HIGH (ref 11.5–15.5)
WBC: 6.7 K/uL (ref 4.0–10.5)
nRBC: 0 % (ref 0.0–0.2)

## 2024-01-28 LAB — HCG, SERUM, QUALITATIVE: Preg, Serum: NEGATIVE

## 2024-01-28 LAB — BASIC METABOLIC PANEL WITH GFR
Anion gap: 9 (ref 5–15)
BUN: 6 mg/dL (ref 6–20)
CO2: 25 mmol/L (ref 22–32)
Calcium: 9.4 mg/dL (ref 8.9–10.3)
Chloride: 107 mmol/L (ref 98–111)
Creatinine, Ser: 0.86 mg/dL (ref 0.44–1.00)
GFR, Estimated: >60 mL/min (ref >60)
Glucose, Bld: 95 mg/dL (ref 70–99)
Potassium: 4.3 mmol/L (ref 3.5–5.1)
Sodium: 141 mmol/L (ref 135–145)

## 2024-01-28 LAB — CBG MONITORING, ED: Glucose-Capillary: 106 mg/dL — ABNORMAL HIGH (ref 70–99)

## 2024-01-28 MED ORDER — PROCHLORPERAZINE EDISYLATE 10 MG/2ML IJ SOLN
10.0000 mg | Freq: Once | INTRAMUSCULAR | Status: AC
  Administered 2024-01-28: 10 mg via INTRAVENOUS
  Filled 2024-01-28: qty 2

## 2024-01-28 MED ORDER — LACTATED RINGERS IV BOLUS
1000.0000 mL | Freq: Once | INTRAVENOUS | Status: AC
  Administered 2024-01-28: 1000 mL via INTRAVENOUS

## 2024-01-28 MED ORDER — KETOROLAC TROMETHAMINE 15 MG/ML IJ SOLN
15.0000 mg | Freq: Once | INTRAMUSCULAR | Status: AC
  Administered 2024-01-28: 15 mg via INTRAVENOUS
  Filled 2024-01-28: qty 1

## 2024-01-28 MED ORDER — ACETAMINOPHEN 500 MG PO TABS
1000.0000 mg | ORAL_TABLET | Freq: Once | ORAL | Status: AC
  Administered 2024-01-28: 1000 mg via ORAL
  Filled 2024-01-28: qty 2

## 2024-01-28 NOTE — ED Triage Notes (Signed)
 Patient with near syncopal episode this morning. Did not hit floor. Reports migraine this morning. Blood sugar 65 with EMS

## 2024-01-28 NOTE — Discharge Instructions (Signed)
 Your workup today is reassuring.  Your cardiac enzyme (troponin) was normal today. Your EKG which is a measure of the heart's electrical activity and rhythm is normal today. These would both show abnormalities if you were having a heart attack or other heart abnormality.   Your blood count showed that you have a anemia which is a low hemoglobin.  Please follow-up with your PCP within the next month for recheck of this value.  This could indicate an iron deficiency.  Your electrolytes were normal today.  Your CT of the brain was normal today.   Your blood sugar was low with EMS.  This could have caused her syncopal episode.  Please continue to eat normal meals throughout the day  Return to the ER if you have any shortness of breath, difficulty breathing, worsening chest pain, dizziness, jaw pain, left arm or shoulder pain, abdominal pain, unexplained fever, any other new or concerning symptoms.

## 2024-01-28 NOTE — ED Provider Notes (Signed)
 [place] EMERGENCY DEPARTMENT AT [facility] Provider Note   CSN: [id number] Arrival date & time: 01/28/24  9040     Patient presents with: Near Syncope   Heidi Martin is a 23 y.o. female with history of migraines, presents with concern for a syncopal episode that occurred just prior to arrival.  States she was at work when she went to stand up and felt dizzy, and started to pass out.  Family member at bedside states that a coworker was able to catch her and lowered her to the floor.  She did not hit her head. Unknown if there was any seizure-like activity. Patient reports she is having a migraine has been ongoing for the past 3 days.  Reports headache is in the left side of her head and is consistent with normal migraines. She has not been eating as much due to nausea from the migraine. Denies any neck pain or stiffness.  Denies any fever or chills.  Denies any chest pain or shortness of breath.     Near Syncope       Prior to Admission medications   Medication Sig Start Date End Date Taking? Authorizing Provider  aspirin EC 325 MG tablet Take 325 mg by mouth daily as needed for mild pain (or headaches).    [provider]  cefdinir  (OMNICEF ) 300 MG capsule Take 1 capsule (300 mg total) by mouth 2 (two) times daily. 12/14/23   Bernis Ernst, PA-C  celecoxib  (CELEBREX ) 200 MG capsule Take 1 capsule (200 mg total) by mouth 2 (two) times daily. 03/19/23   Harris, Abigail, PA-C  dicyclomine  (BENTYL ) 20 MG tablet Take 1 tablet (20 mg total) by mouth 2 (two) times daily. 01/14/24   Hildegard Loge, PA-C  doxycycline  (VIBRAMYCIN ) 100 MG capsule Take 1 capsule (100 mg total) by mouth 2 (two) times daily. 01/14/24   Hildegard Loge, PA-C  ESTARYLLA 0.25-35 MG-MCG tablet Take 1 tablet by mouth daily. 03/15/23   [provider]  hyoscyamine  (LEVSIN AMIEL) 0.125 MG SL tablet Place 1 tablet (0.125 mg total) under the tongue every 6 (six) hours as needed for cramping (pain). Up to 1.25  mg daily 03/19/23   Harris, Abigail, PA-C  ibuprofen  (ADVIL ) 800 MG tablet Take 1 tablet (800 mg total) by mouth every 8 (eight) hours as needed. 11/14/23   Sofia, Leslie K, PA-C  metroNIDAZOLE  (FLAGYL ) 500 MG tablet Take 1 tablet (500 mg total) by mouth 2 (two) times daily. 01/14/24   Hildegard Loge, PA-C  ondansetron  (ZOFRAN -ODT) 4 MG disintegrating tablet Take 1 tablet (4 mg total) by mouth every 8 (eight) hours as needed. 01/14/24   Hildegard, Amjad, PA-C  pantoprazole  (PROTONIX ) 20 MG tablet Take 1 tablet (20 mg total) by mouth daily. 06/05/23 07/05/23  Robinson, John K, PA-C  prochlorperazine  (COMPAZINE ) 10 MG tablet Take 1 tablet (10 mg total) by mouth 2 (two) times daily as needed for nausea or vomiting. 03/19/23   Arloa Chroman, PA-C  TYLENOL  325 MG CAPS Take 325-650 mg by mouth every 6 (six) hours as needed (for pain or headaches).    [provider]    Allergies: Milk [milk (cow)]    Review of Systems  Cardiovascular:  Positive for near-syncope.    Updated Vital Signs BP (!) 129/90   Pulse (!) 52   Resp 16   SpO2 100%   Physical Exam Vitals and nursing note reviewed.  Constitutional:      General: She is not in acute distress.  Appearance: She is well-developed.     Comments: Appears somewhat dazed  HENT:     Head: Normocephalic and atraumatic.  Eyes:     Extraocular Movements: Extraocular movements intact.     Conjunctiva/sclera: Conjunctivae normal.     Pupils: Pupils are equal, round, and reactive to light.  Cardiovascular:     Rate and Rhythm: Normal rate and regular rhythm.     Heart sounds: No murmur heard. Pulmonary:     Effort: Pulmonary effort is normal. No respiratory distress.     Breath sounds: Normal breath sounds.  Abdominal:     Palpations: Abdomen is soft.     Tenderness: There is no abdominal tenderness.  Musculoskeletal:        General: No swelling.     Cervical back: Normal range of motion and neck supple. No rigidity.     Right lower leg: No  edema.     Left lower leg: No edema.     Comments: No calf edema or TTP  Skin:    General: Skin is warm and dry.     Capillary Refill: Capillary refill takes less than 2 seconds.  Neurological:     General: No focal deficit present.     Mental Status: She is alert.     Comments: Mental status: Alert and oriented to self, place, and month  Speech: Answers questions appropriately  Cranial Nerves: III, IV, VI: EOM intact, Pupils equal round and reactive, no gaze preference or deviation, no nystagmus. V: normal sensation in V1, V2, and V3 segments bilaterally VII: smiles, puffs cheeks, raises eyebrows, and closes eyes without asymmetry.  VIII: normal hearing to speech IX, X: normal palatal elevation, no uvular deviation XI: 5/5 head turn and 5/5 shoulder shrug bilaterally XII: midline tongue protrusion  Motor: 5/5 strength with hand squeeze and ankle plantarflexion and dorsiflexion bilaterally   Sensory: Intact sensation in upper and lower extremity bilaterally     Psychiatric:        Mood and Affect: Mood normal.     (all labs ordered are listed, but only abnormal results are displayed) Labs Reviewed  CBC WITH DIFFERENTIAL/PLATELET - Abnormal; Notable for the following components:      Result Value   Hemoglobin 11.3 (*)    HCT 35.7 (*)    MCV 77.1 (*)    MCH 24.4 (*)    RDW 17.2 (*)    All other components within normal limits  CBG MONITORING, ED - Abnormal; Notable for the following components:   Glucose-Capillary 106 (*)    All other components within normal limits  BASIC METABOLIC PANEL WITH GFR  HCG, SERUM, QUALITATIVE  TROPONIN T, HIGH SENSITIVITY    EKG: EKG Interpretation Date/Time:  Monday January 28 2024 10:08:34 EDT Ventricular Rate:  66 PR Interval:  163 QRS Duration:  86 QT Interval:  421 QTC Calculation: 442 R Axis:   39  Text Interpretation: Sinus rhythm Confirmed by Ula Barter [id number]) on 01/28/2024 10:20:31 AM  Radiology: CT Head Wo  Contrast Result Date: 01/28/2024 CLINICAL DATA:  Provided history: Headache, increasing frequency or severity. Headache and syncope. EXAM: CT HEAD WITHOUT CONTRAST TECHNIQUE: Contiguous axial images were obtained from the base of the skull through the vertex without intravenous contrast. RADIATION DOSE REDUCTION: This exam was performed according to the departmental dose-optimization program which includes automated exposure control, adjustment of the mA and/or kV according to patient size and/or use of iterative reconstruction technique. COMPARISON:  Head CT 07/20/2021. Brain MRI 10/02/2013.  FINDINGS: Brain: Cerebral volume is normal. There is no acute intracranial hemorrhage. No demarcated cortical infarct. No extra-axial fluid collection. No evidence of an intracranial mass. No midline shift. Vascular: No hyperdense vessel. Skull: No calvarial fracture or aggressive osseous lesion. Sinuses/Orbits: No orbital mass or acute orbital finding. No significant paranasal sinus disease at the imaged levels. IMPRESSION: No evidence of an acute intracranial abnormality. Electronically Signed   By: Rockey Childs D.O.   On: 01/28/2024 11:03     Procedures   Medications Ordered in the ED  lactated ringers  bolus 1,000 mL (1,000 mLs Intravenous New Bag/Given 01/28/24 1030)  acetaminophen  (TYLENOL ) tablet 1,000 mg (1,000 mg Oral Given 01/28/24 1145)  ketorolac  (TORADOL ) 15 MG/ML injection 15 mg (15 mg Intravenous Given 01/28/24 1148)  prochlorperazine  (COMPAZINE ) injection 10 mg (10 mg Intravenous Given 01/28/24 1143)                                    Medical Decision Making Amount and/or Complexity of Data Reviewed Labs: ordered. Radiology: ordered.  Risk OTC drugs. Prescription drug management.     Differential diagnosis includes but is not limited to arrhythmia, hypoglycemia, orthostatic hypotension, dehydration, ACS, seizure, migraine  ED Course:  Upon initial evaluation, patient appears somewhat  dazed, but able to answer questions appropriately.  Stable vitals.  No neurologic deficits noted.  She does not have any signs of head trauma or other injury. Reporting pain currently in the left side of the head consistent with her normal migraines. No neck pain, stiffness, or fevers, no concern for meningitis.  Labs Ordered: I Ordered, and personally interpreted labs.  The pertinent results include:   CBC without leukocytosis.  Mild anemia with hemoglobin 11.3.  Appears to be at baseline BMP without any abnormalities CBG here at 106 Pregnancy negative Troponin under 15  Imaging Studies ordered: I ordered imaging studies including CT head I independently visualized the imaging with scope of interpretation limited to determining acute life threatening conditions related to emergency care.  No acute abnormalities I agree with the radiologist interpretation   Cardiac Monitoring: / EKG: The patient was maintained on a cardiac monitor.  I personally viewed and interpreted the cardiac monitored which showed an underlying rhythm of: Normal sinus rhythm  Medications Given: LR bolus Compazine  Toradol  Tylenol   Upon re-evaluation, patient remains well-appearing with stable vitals.  She reports her headache has resolved.  She appears more alert than she did coming in.  She is asking to leave.  Her workup has been reassuring.  She did not have any acute abnormalities on the CT scan to explain her headaches.  Lower concern for seizure as there is no witnessed convulsions/seizure-like activity.  Her EKG shows normal sinus rhythm and troponin under 15, low concern for cardiac etiology.  She denies any chest pain, shortness of breath during or after this incident.  Her blood sugar was reportedly low at 65 with EMS, question if this could have been an episode of hypoglycemia.  She also was standing up when she got dizzy, question if this could have been orthostatic hypotension.  She does not have any signs  of systemic illness such as fever, leukocytosis, or tachycardia.  According to family members at bedside, patient to baseline currently.  Feel she is stable and appropriate for discharge home at this time    Impression: Syncopal episode  Disposition:  The patient was discharged home with instructions to  eat normal meals at home to keep blood sugar within the normal range.  Follow-up with PCP within the next month regarding her anemia. Return precautions given.   This chart was dictated using voice recognition software, Dragon. Despite the best efforts of this provider to proofread and correct errors, errors may still occur which can change documentation meaning.       Final diagnoses:  Syncope, unspecified syncope type    ED Discharge Orders     None          Veta Palma, DEVONNA 01/28/24 1234    Ula Prentice SAUNDERS, MD 01/28/24 1525

## 2024-02-05 ENCOUNTER — Encounter: Admitting: Hematology and Oncology

## 2024-02-11 ENCOUNTER — Inpatient Hospital Stay: Attending: Hematology and Oncology | Admitting: Hematology and Oncology

## 2024-02-12 ENCOUNTER — Other Ambulatory Visit: Payer: Self-pay | Admitting: Physician Assistant

## 2024-02-12 DIAGNOSIS — N643 Galactorrhea not associated with childbirth: Secondary | ICD-10-CM

## 2024-02-27 ENCOUNTER — Other Ambulatory Visit

## 2024-02-28 ENCOUNTER — Inpatient Hospital Stay
Admission: RE | Admit: 2024-02-28 | Discharge: 2024-02-28 | Discharge status: Home / Self Care | Source: Ambulatory Visit | Attending: Physician Assistant | Admitting: Physician Assistant

## 2024-02-28 ENCOUNTER — Other Ambulatory Visit: Payer: Self-pay | Admitting: Physician Assistant

## 2024-02-28 ENCOUNTER — Ambulatory Visit
Admission: RE | Admit: 2024-02-28 | Discharge: 2024-02-28 | Disposition: A | Discharge status: Home / Self Care | Source: Ambulatory Visit | Attending: Physician Assistant | Admitting: Physician Assistant

## 2024-02-28 DIAGNOSIS — N644 Mastodynia: Secondary | ICD-10-CM

## 2024-02-28 DIAGNOSIS — N643 Galactorrhea not associated with childbirth: Secondary | ICD-10-CM

## 2024-02-28 DIAGNOSIS — N6452 Nipple discharge: Secondary | ICD-10-CM

## 2024-03-10 ENCOUNTER — Inpatient Hospital Stay: Attending: Hematology and Oncology | Admitting: Hematology and Oncology
